# Patient Record
Sex: Female | Born: 1950 | Race: White | Hispanic: No | Smoking: Former smoker
Health system: Southern US, Community
[De-identification: ages and names within clinical notes are randomized; demographics above are authoritative.]

## PROBLEM LIST (undated history)

## (undated) DIAGNOSIS — M109 Gout, unspecified: Secondary | ICD-10-CM

## (undated) DIAGNOSIS — E049 Nontoxic goiter, unspecified: Secondary | ICD-10-CM

## (undated) DIAGNOSIS — T7840XA Allergy, unspecified, initial encounter: Secondary | ICD-10-CM

## (undated) DIAGNOSIS — G35 Multiple sclerosis: Secondary | ICD-10-CM

## (undated) DIAGNOSIS — I10 Essential (primary) hypertension: Secondary | ICD-10-CM

## (undated) DIAGNOSIS — F32A Depression, unspecified: Secondary | ICD-10-CM

## (undated) DIAGNOSIS — E785 Hyperlipidemia, unspecified: Secondary | ICD-10-CM

## (undated) DIAGNOSIS — K219 Gastro-esophageal reflux disease without esophagitis: Secondary | ICD-10-CM

## (undated) DIAGNOSIS — S7290XA Unspecified fracture of unspecified femur, initial encounter for closed fracture: Secondary | ICD-10-CM

## (undated) DIAGNOSIS — N390 Urinary tract infection, site not specified: Secondary | ICD-10-CM

## (undated) DIAGNOSIS — I82409 Acute embolism and thrombosis of unspecified deep veins of unspecified lower extremity: Secondary | ICD-10-CM

## (undated) DIAGNOSIS — J449 Chronic obstructive pulmonary disease, unspecified: Secondary | ICD-10-CM

## (undated) DIAGNOSIS — IMO0001 Reserved for inherently not codable concepts without codable children: Secondary | ICD-10-CM

## (undated) DIAGNOSIS — E039 Hypothyroidism, unspecified: Secondary | ICD-10-CM

## (undated) DIAGNOSIS — E1140 Type 2 diabetes mellitus with diabetic neuropathy, unspecified: Secondary | ICD-10-CM

## (undated) DIAGNOSIS — F329 Major depressive disorder, single episode, unspecified: Secondary | ICD-10-CM

## (undated) DIAGNOSIS — C50919 Malignant neoplasm of unspecified site of unspecified female breast: Secondary | ICD-10-CM

## (undated) DIAGNOSIS — G35D Multiple sclerosis, unspecified: Secondary | ICD-10-CM

## (undated) DIAGNOSIS — G629 Polyneuropathy, unspecified: Secondary | ICD-10-CM

## (undated) DIAGNOSIS — E1165 Type 2 diabetes mellitus with hyperglycemia: Secondary | ICD-10-CM

## (undated) HISTORY — DX: Major depressive disorder, single episode, unspecified: F32.9

## (undated) HISTORY — PX: TONSILLECTOMY: SHX5217

## (undated) HISTORY — DX: Hypothyroidism, unspecified: E03.9

## (undated) HISTORY — DX: Malignant neoplasm of unspecified site of unspecified female breast: C50.919

## (undated) HISTORY — DX: Hyperlipidemia, unspecified: E78.5

## (undated) HISTORY — DX: Gout, unspecified: M10.9

## (undated) HISTORY — DX: Gastro-esophageal reflux disease without esophagitis: K21.9

## (undated) HISTORY — DX: Type 2 diabetes mellitus with diabetic neuropathy, unspecified: E11.40

## (undated) HISTORY — DX: Reserved for inherently not codable concepts without codable children: IMO0001

## (undated) HISTORY — DX: Depression, unspecified: F32.A

## (undated) HISTORY — DX: Acute embolism and thrombosis of unspecified deep veins of unspecified lower extremity: I82.409

## (undated) HISTORY — DX: Nontoxic goiter, unspecified: E04.9

## (undated) HISTORY — DX: Multiple sclerosis, unspecified: G35.D

## (undated) HISTORY — DX: Polyneuropathy, unspecified: G62.9

## (undated) HISTORY — DX: Allergy, unspecified, initial encounter: T78.40XA

## (undated) HISTORY — DX: Type 2 diabetes mellitus with hyperglycemia: E11.65

## (undated) HISTORY — DX: Essential (primary) hypertension: I10

## (undated) HISTORY — PX: OTHER SURGICAL HISTORY: SHX169

## (undated) HISTORY — DX: Multiple sclerosis: G35

## (undated) HISTORY — DX: Chronic obstructive pulmonary disease, unspecified: J44.9

## (undated) HISTORY — PX: APPENDECTOMY: SHX54

---

## 1973-11-10 HISTORY — PX: PARTIAL HYSTERECTOMY: SHX80

## 1989-07-11 DIAGNOSIS — I82409 Acute embolism and thrombosis of unspecified deep veins of unspecified lower extremity: Secondary | ICD-10-CM

## 1989-07-11 HISTORY — DX: Acute embolism and thrombosis of unspecified deep veins of unspecified lower extremity: I82.409

## 1991-11-11 HISTORY — PX: AXILLARY HIDRADENITIS EXCISION: SUR522

## 1998-08-30 ENCOUNTER — Ambulatory Visit (HOSPITAL_COMMUNITY): Admission: RE | Admit: 1998-08-30 | Discharge: 1998-08-30 | Payer: Self-pay | Admitting: General Surgery

## 1998-08-30 ENCOUNTER — Encounter: Payer: Self-pay | Admitting: General Surgery

## 2002-04-13 ENCOUNTER — Encounter: Payer: Self-pay | Admitting: Internal Medicine

## 2004-05-14 ENCOUNTER — Inpatient Hospital Stay (HOSPITAL_COMMUNITY): Admission: EM | Admit: 2004-05-14 | Discharge: 2004-05-16 | Payer: Self-pay | Admitting: *Deleted

## 2004-05-16 HISTORY — PX: TRANSTHORACIC ECHOCARDIOGRAM: SHX275

## 2004-09-10 ENCOUNTER — Encounter (INDEPENDENT_AMBULATORY_CARE_PROVIDER_SITE_OTHER): Payer: Self-pay | Admitting: *Deleted

## 2004-09-10 ENCOUNTER — Observation Stay (HOSPITAL_COMMUNITY): Admission: RE | Admit: 2004-09-10 | Discharge: 2004-09-11 | Payer: Self-pay | Admitting: Surgery

## 2004-09-10 HISTORY — PX: THYROIDECTOMY: SHX17

## 2004-09-19 ENCOUNTER — Ambulatory Visit: Payer: Self-pay | Admitting: Internal Medicine

## 2004-10-08 ENCOUNTER — Ambulatory Visit: Payer: Self-pay | Admitting: Endocrinology

## 2004-10-29 ENCOUNTER — Ambulatory Visit: Payer: Self-pay | Admitting: Internal Medicine

## 2004-11-26 ENCOUNTER — Ambulatory Visit: Payer: Self-pay | Admitting: Endocrinology

## 2004-12-06 ENCOUNTER — Ambulatory Visit: Payer: Self-pay | Admitting: Endocrinology

## 2004-12-17 ENCOUNTER — Ambulatory Visit: Payer: Self-pay | Admitting: Endocrinology

## 2004-12-30 ENCOUNTER — Ambulatory Visit: Payer: Self-pay | Admitting: Internal Medicine

## 2005-01-14 ENCOUNTER — Ambulatory Visit: Payer: Self-pay | Admitting: Endocrinology

## 2005-01-21 ENCOUNTER — Encounter: Payer: Self-pay | Admitting: Internal Medicine

## 2005-01-28 ENCOUNTER — Ambulatory Visit: Payer: Self-pay | Admitting: Endocrinology

## 2005-02-11 ENCOUNTER — Ambulatory Visit: Payer: Self-pay | Admitting: Endocrinology

## 2005-03-11 ENCOUNTER — Ambulatory Visit: Payer: Self-pay | Admitting: Endocrinology

## 2005-03-17 ENCOUNTER — Ambulatory Visit: Payer: Self-pay | Admitting: Internal Medicine

## 2005-04-15 ENCOUNTER — Ambulatory Visit: Payer: Self-pay | Admitting: Endocrinology

## 2005-05-26 ENCOUNTER — Ambulatory Visit: Payer: Self-pay | Admitting: Internal Medicine

## 2005-05-30 ENCOUNTER — Ambulatory Visit: Payer: Self-pay | Admitting: Internal Medicine

## 2005-08-12 ENCOUNTER — Ambulatory Visit: Payer: Self-pay | Admitting: Endocrinology

## 2005-08-19 ENCOUNTER — Ambulatory Visit: Payer: Self-pay | Admitting: Endocrinology

## 2005-09-09 ENCOUNTER — Ambulatory Visit: Payer: Self-pay | Admitting: Internal Medicine

## 2005-10-06 ENCOUNTER — Ambulatory Visit: Payer: Self-pay | Admitting: Internal Medicine

## 2005-11-07 ENCOUNTER — Ambulatory Visit: Payer: Self-pay | Admitting: Family Medicine

## 2005-11-25 ENCOUNTER — Ambulatory Visit: Payer: Self-pay | Admitting: Internal Medicine

## 2006-02-17 ENCOUNTER — Ambulatory Visit: Payer: Self-pay | Admitting: Endocrinology

## 2006-03-06 ENCOUNTER — Ambulatory Visit: Payer: Self-pay | Admitting: Internal Medicine

## 2006-03-09 ENCOUNTER — Emergency Department: Payer: Self-pay | Admitting: Emergency Medicine

## 2006-03-09 ENCOUNTER — Other Ambulatory Visit: Payer: Self-pay

## 2006-03-24 ENCOUNTER — Ambulatory Visit: Payer: Self-pay | Admitting: Internal Medicine

## 2006-05-19 ENCOUNTER — Ambulatory Visit: Payer: Self-pay | Admitting: Internal Medicine

## 2006-05-28 ENCOUNTER — Ambulatory Visit: Payer: Self-pay | Admitting: Family Medicine

## 2006-06-23 ENCOUNTER — Ambulatory Visit: Payer: Self-pay | Admitting: Internal Medicine

## 2006-08-28 ENCOUNTER — Ambulatory Visit: Payer: Self-pay | Admitting: Internal Medicine

## 2006-12-15 ENCOUNTER — Ambulatory Visit: Payer: Self-pay | Admitting: Internal Medicine

## 2006-12-15 LAB — CONVERTED CEMR LAB
Chloride: 106 meq/L (ref 96–112)
Creatinine,U: 97.3 mg/dL
Hgb A1c MFr Bld: 8.4 % — ABNORMAL HIGH (ref 4.6–6.0)
Microalb, Ur: 0.8 mg/dL (ref 0.0–1.9)
Phosphorus: 3.1 mg/dL (ref 2.3–4.6)
TSH: 0.75 microintl units/mL (ref 0.35–5.50)

## 2007-01-20 ENCOUNTER — Ambulatory Visit: Payer: Self-pay | Admitting: Internal Medicine

## 2007-03-12 ENCOUNTER — Ambulatory Visit: Payer: Self-pay | Admitting: Internal Medicine

## 2007-04-07 ENCOUNTER — Telehealth (INDEPENDENT_AMBULATORY_CARE_PROVIDER_SITE_OTHER): Payer: Self-pay | Admitting: *Deleted

## 2007-05-25 ENCOUNTER — Telehealth (INDEPENDENT_AMBULATORY_CARE_PROVIDER_SITE_OTHER): Payer: Self-pay | Admitting: *Deleted

## 2007-06-05 ENCOUNTER — Encounter: Payer: Self-pay | Admitting: Internal Medicine

## 2007-06-05 DIAGNOSIS — I10 Essential (primary) hypertension: Secondary | ICD-10-CM | POA: Insufficient documentation

## 2007-06-05 DIAGNOSIS — F39 Unspecified mood [affective] disorder: Secondary | ICD-10-CM

## 2007-06-05 DIAGNOSIS — F17200 Nicotine dependence, unspecified, uncomplicated: Secondary | ICD-10-CM

## 2007-06-08 ENCOUNTER — Telehealth (INDEPENDENT_AMBULATORY_CARE_PROVIDER_SITE_OTHER): Payer: Self-pay | Admitting: *Deleted

## 2007-06-15 ENCOUNTER — Telehealth (INDEPENDENT_AMBULATORY_CARE_PROVIDER_SITE_OTHER): Payer: Self-pay | Admitting: *Deleted

## 2007-06-24 DIAGNOSIS — J309 Allergic rhinitis, unspecified: Secondary | ICD-10-CM | POA: Insufficient documentation

## 2007-06-24 DIAGNOSIS — R32 Unspecified urinary incontinence: Secondary | ICD-10-CM

## 2007-06-24 DIAGNOSIS — K219 Gastro-esophageal reflux disease without esophagitis: Secondary | ICD-10-CM

## 2007-07-01 DIAGNOSIS — E1149 Type 2 diabetes mellitus with other diabetic neurological complication: Secondary | ICD-10-CM

## 2007-07-20 ENCOUNTER — Ambulatory Visit: Payer: Self-pay | Admitting: Internal Medicine

## 2007-07-21 LAB — CONVERTED CEMR LAB
ALT: 37 units/L — ABNORMAL HIGH (ref 0–35)
AST: 41 units/L — ABNORMAL HIGH (ref 0–37)
BUN: 18 mg/dL (ref 6–23)
Basophils Relative: 0 % (ref 0.0–1.0)
Bilirubin, Direct: 0.1 mg/dL (ref 0.0–0.3)
CO2: 28 meq/L (ref 19–32)
Creatinine, Ser: 0.6 mg/dL (ref 0.4–1.2)
Eosinophils Absolute: 0.5 10*3/uL (ref 0.0–0.6)
Eosinophils Relative: 4.5 % (ref 0.0–5.0)
Free T4: 0.9 ng/dL (ref 0.6–1.6)
GFR calc Af Amer: 132 mL/min
GFR calc non Af Amer: 109 mL/min
Glucose, Bld: 191 mg/dL — ABNORMAL HIGH (ref 70–99)
Hemoglobin: 15.6 g/dL — ABNORMAL HIGH (ref 12.0–15.0)
Monocytes Relative: 12.4 % — ABNORMAL HIGH (ref 3.0–11.0)
Neutrophils Relative %: 57.3 % (ref 43.0–77.0)
Phosphorus: 3.6 mg/dL (ref 2.3–4.6)
Platelets: 210 10*3/uL (ref 150–400)
Potassium: 3.5 meq/L (ref 3.5–5.1)
Sodium: 140 meq/L (ref 135–145)
Total Bilirubin: 0.7 mg/dL (ref 0.3–1.2)
Total Protein: 7.3 g/dL (ref 6.0–8.3)

## 2007-08-27 ENCOUNTER — Ambulatory Visit: Payer: Self-pay | Admitting: Internal Medicine

## 2007-10-18 ENCOUNTER — Telehealth: Payer: Self-pay | Admitting: Internal Medicine

## 2007-10-20 ENCOUNTER — Encounter: Payer: Self-pay | Admitting: Internal Medicine

## 2007-11-29 ENCOUNTER — Ambulatory Visit: Payer: Self-pay | Admitting: Internal Medicine

## 2007-11-29 ENCOUNTER — Telehealth (INDEPENDENT_AMBULATORY_CARE_PROVIDER_SITE_OTHER): Payer: Self-pay | Admitting: *Deleted

## 2008-01-19 ENCOUNTER — Ambulatory Visit: Payer: Self-pay | Admitting: Internal Medicine

## 2008-01-20 LAB — CONVERTED CEMR LAB
BUN: 20 mg/dL (ref 6–23)
Creatinine, Ser: 0.8 mg/dL (ref 0.4–1.2)
Direct LDL: 133 mg/dL
GFR calc Af Amer: 94 mL/min
GFR calc non Af Amer: 78 mL/min
HDL: 41.1 mg/dL (ref >39.0)
Hemoglobin: 15.9 g/dL — ABNORMAL HIGH (ref 12.0–15.0)
Potassium: 3.4 meq/L — ABNORMAL LOW (ref 3.5–5.1)
TSH: 0.39 microintl units/mL (ref 0.35–5.50)

## 2008-02-10 ENCOUNTER — Encounter (INDEPENDENT_AMBULATORY_CARE_PROVIDER_SITE_OTHER): Payer: Self-pay | Admitting: Internal Medicine

## 2008-02-22 ENCOUNTER — Ambulatory Visit: Payer: Self-pay | Admitting: Family Medicine

## 2008-03-03 ENCOUNTER — Telehealth: Payer: Self-pay | Admitting: Internal Medicine

## 2008-03-08 ENCOUNTER — Telehealth (INDEPENDENT_AMBULATORY_CARE_PROVIDER_SITE_OTHER): Payer: Self-pay | Admitting: *Deleted

## 2008-03-10 HISTORY — PX: CARPAL TUNNEL RELEASE: SHX101

## 2008-03-14 ENCOUNTER — Encounter: Payer: Self-pay | Admitting: Internal Medicine

## 2008-03-15 ENCOUNTER — Encounter: Payer: Self-pay | Admitting: Internal Medicine

## 2008-04-06 ENCOUNTER — Telehealth (INDEPENDENT_AMBULATORY_CARE_PROVIDER_SITE_OTHER): Payer: Self-pay | Admitting: *Deleted

## 2008-04-07 ENCOUNTER — Encounter: Payer: Self-pay | Admitting: Internal Medicine

## 2008-04-11 ENCOUNTER — Ambulatory Visit (HOSPITAL_COMMUNITY): Admission: RE | Admit: 2008-04-11 | Discharge: 2008-04-11 | Payer: Self-pay | Admitting: Orthopedic Surgery

## 2008-04-13 ENCOUNTER — Encounter: Payer: Self-pay | Admitting: Internal Medicine

## 2008-04-26 ENCOUNTER — Ambulatory Visit: Payer: Self-pay | Admitting: Internal Medicine

## 2008-05-03 ENCOUNTER — Encounter: Payer: Self-pay | Admitting: Internal Medicine

## 2008-05-08 ENCOUNTER — Emergency Department (HOSPITAL_COMMUNITY): Admission: EM | Admit: 2008-05-08 | Discharge: 2008-05-08 | Payer: Self-pay | Admitting: Emergency Medicine

## 2008-05-18 ENCOUNTER — Telehealth: Payer: Self-pay | Admitting: Internal Medicine

## 2008-06-05 ENCOUNTER — Telehealth (INDEPENDENT_AMBULATORY_CARE_PROVIDER_SITE_OTHER): Payer: Self-pay | Admitting: *Deleted

## 2008-06-05 ENCOUNTER — Telehealth: Payer: Self-pay | Admitting: Internal Medicine

## 2008-06-15 ENCOUNTER — Telehealth (INDEPENDENT_AMBULATORY_CARE_PROVIDER_SITE_OTHER): Payer: Self-pay | Admitting: *Deleted

## 2008-06-27 ENCOUNTER — Ambulatory Visit: Payer: Self-pay | Admitting: Internal Medicine

## 2008-07-05 ENCOUNTER — Encounter: Payer: Self-pay | Admitting: Internal Medicine

## 2008-07-05 ENCOUNTER — Telehealth: Payer: Self-pay | Admitting: Internal Medicine

## 2008-07-06 ENCOUNTER — Telehealth: Payer: Self-pay | Admitting: Internal Medicine

## 2008-07-11 ENCOUNTER — Encounter: Payer: Self-pay | Admitting: Internal Medicine

## 2008-07-19 ENCOUNTER — Telehealth: Payer: Self-pay | Admitting: Internal Medicine

## 2008-07-21 ENCOUNTER — Telehealth: Payer: Self-pay | Admitting: Internal Medicine

## 2008-08-01 ENCOUNTER — Telehealth (INDEPENDENT_AMBULATORY_CARE_PROVIDER_SITE_OTHER): Payer: Self-pay | Admitting: *Deleted

## 2008-08-09 ENCOUNTER — Inpatient Hospital Stay (HOSPITAL_COMMUNITY): Admission: EM | Admit: 2008-08-09 | Discharge: 2008-08-13 | Payer: Self-pay | Admitting: Emergency Medicine

## 2008-08-09 ENCOUNTER — Ambulatory Visit: Payer: Self-pay | Admitting: Internal Medicine

## 2008-08-09 ENCOUNTER — Telehealth: Payer: Self-pay | Admitting: Internal Medicine

## 2008-08-10 ENCOUNTER — Ambulatory Visit: Payer: Self-pay | Admitting: Internal Medicine

## 2008-08-13 ENCOUNTER — Encounter: Payer: Self-pay | Admitting: Internal Medicine

## 2008-08-15 ENCOUNTER — Telehealth: Payer: Self-pay | Admitting: Family Medicine

## 2008-08-16 ENCOUNTER — Ambulatory Visit: Payer: Self-pay | Admitting: Internal Medicine

## 2008-08-16 DIAGNOSIS — G35 Multiple sclerosis: Secondary | ICD-10-CM

## 2008-09-07 ENCOUNTER — Telehealth: Payer: Self-pay | Admitting: Internal Medicine

## 2008-09-12 ENCOUNTER — Ambulatory Visit: Payer: Self-pay | Admitting: Family Medicine

## 2008-09-25 ENCOUNTER — Telehealth (INDEPENDENT_AMBULATORY_CARE_PROVIDER_SITE_OTHER): Payer: Self-pay | Admitting: *Deleted

## 2008-10-25 ENCOUNTER — Telehealth: Payer: Self-pay | Admitting: Internal Medicine

## 2008-10-30 ENCOUNTER — Ambulatory Visit: Payer: Self-pay | Admitting: Internal Medicine

## 2008-10-31 LAB — CONVERTED CEMR LAB
Albumin: 3.7 g/dL (ref 3.5–5.2)
Basophils Relative: 1 % (ref 0.0–3.0)
Chloride: 97 meq/L (ref 96–112)
Creatinine, Ser: 0.9 mg/dL (ref 0.4–1.2)
Eosinophils Relative: 8 % — ABNORMAL HIGH (ref 0.0–5.0)
GFR calc Af Amer: 82 mL/min
GFR calc non Af Amer: 68 mL/min
Glucose, Bld: 168 mg/dL — ABNORMAL HIGH (ref 70–99)
HCT: 44.7 % (ref 36.0–46.0)
HDL: 35 mg/dL — ABNORMAL LOW (ref >39.0)
Hemoglobin: 15.4 g/dL — ABNORMAL HIGH (ref 12.0–15.0)
Hgb A1c MFr Bld: 7 % — ABNORMAL HIGH (ref 4.6–6.0)
LDL Cholesterol: 116 mg/dL — ABNORMAL HIGH (ref 0–99)
Lymphocytes Relative: 23.8 % (ref 12.0–46.0)
MCHC: 34.4 g/dL (ref 30.0–36.0)
MCV: 94.2 fL (ref 78.0–100.0)
Neutrophils Relative %: 55.7 % (ref 43.0–77.0)
Platelets: 199 10*3/uL (ref 150–400)
RBC: 4.74 M/uL (ref 3.87–5.11)
Sodium: 141 meq/L (ref 135–145)
TSH: 3.78 microintl units/mL (ref 0.35–5.50)
VLDL: 37 mg/dL (ref 0–40)
WBC: 9.1 10*3/uL (ref 4.5–10.5)

## 2008-11-07 ENCOUNTER — Telehealth: Payer: Self-pay | Admitting: Internal Medicine

## 2008-11-13 ENCOUNTER — Telehealth: Payer: Self-pay | Admitting: Internal Medicine

## 2008-12-12 ENCOUNTER — Telehealth: Payer: Self-pay | Admitting: Internal Medicine

## 2008-12-14 ENCOUNTER — Telehealth: Payer: Self-pay | Admitting: Internal Medicine

## 2008-12-22 ENCOUNTER — Ambulatory Visit: Payer: Self-pay | Admitting: Internal Medicine

## 2008-12-29 ENCOUNTER — Telehealth: Payer: Self-pay | Admitting: Family Medicine

## 2009-01-01 ENCOUNTER — Telehealth: Payer: Self-pay | Admitting: Internal Medicine

## 2009-01-17 ENCOUNTER — Ambulatory Visit: Payer: Self-pay | Admitting: Internal Medicine

## 2009-01-17 ENCOUNTER — Inpatient Hospital Stay (HOSPITAL_COMMUNITY): Admission: EM | Admit: 2009-01-17 | Discharge: 2009-01-19 | Payer: Self-pay | Admitting: Emergency Medicine

## 2009-01-17 ENCOUNTER — Ambulatory Visit: Payer: Self-pay | Admitting: Cardiovascular Disease

## 2009-01-18 ENCOUNTER — Encounter: Payer: Self-pay | Admitting: Internal Medicine

## 2009-01-18 ENCOUNTER — Ambulatory Visit: Payer: Self-pay | Admitting: Surgery

## 2009-01-18 ENCOUNTER — Encounter (INDEPENDENT_AMBULATORY_CARE_PROVIDER_SITE_OTHER): Payer: Self-pay | Admitting: Internal Medicine

## 2009-01-20 ENCOUNTER — Telehealth: Payer: Self-pay | Admitting: Internal Medicine

## 2009-01-22 ENCOUNTER — Telehealth: Payer: Self-pay | Admitting: Family Medicine

## 2009-01-23 ENCOUNTER — Telehealth: Payer: Self-pay | Admitting: Internal Medicine

## 2009-01-31 ENCOUNTER — Encounter: Admission: RE | Admit: 2009-01-31 | Discharge: 2009-01-31 | Payer: Self-pay | Admitting: Internal Medicine

## 2009-01-31 LAB — HM MAMMOGRAPHY

## 2009-02-05 ENCOUNTER — Telehealth: Payer: Self-pay | Admitting: Internal Medicine

## 2009-02-08 ENCOUNTER — Encounter: Payer: Self-pay | Admitting: Internal Medicine

## 2009-02-08 DIAGNOSIS — C50919 Malignant neoplasm of unspecified site of unspecified female breast: Secondary | ICD-10-CM | POA: Insufficient documentation

## 2009-02-08 HISTORY — PX: MASTECTOMY, RADICAL: SHX710

## 2009-02-20 ENCOUNTER — Telehealth: Payer: Self-pay | Admitting: Internal Medicine

## 2009-02-26 ENCOUNTER — Telehealth: Payer: Self-pay | Admitting: Internal Medicine

## 2009-02-26 ENCOUNTER — Ambulatory Visit: Payer: Self-pay | Admitting: Surgery

## 2009-03-05 ENCOUNTER — Ambulatory Visit: Payer: Self-pay | Admitting: Surgery

## 2009-03-06 ENCOUNTER — Encounter: Payer: Self-pay | Admitting: Internal Medicine

## 2009-03-09 ENCOUNTER — Telehealth: Payer: Self-pay | Admitting: Internal Medicine

## 2009-03-10 ENCOUNTER — Ambulatory Visit: Payer: Self-pay | Admitting: Oncology

## 2009-03-13 ENCOUNTER — Encounter: Payer: Self-pay | Admitting: Internal Medicine

## 2009-03-16 ENCOUNTER — Telehealth: Payer: Self-pay | Admitting: Family Medicine

## 2009-03-20 ENCOUNTER — Telehealth: Payer: Self-pay | Admitting: Internal Medicine

## 2009-03-21 ENCOUNTER — Telehealth: Payer: Self-pay | Admitting: Internal Medicine

## 2009-03-22 ENCOUNTER — Ambulatory Visit: Payer: Self-pay | Admitting: Internal Medicine

## 2009-03-23 ENCOUNTER — Telehealth: Payer: Self-pay | Admitting: Internal Medicine

## 2009-03-27 ENCOUNTER — Telehealth (INDEPENDENT_AMBULATORY_CARE_PROVIDER_SITE_OTHER): Payer: Self-pay | Admitting: *Deleted

## 2009-03-28 ENCOUNTER — Telehealth: Payer: Self-pay | Admitting: Internal Medicine

## 2009-03-29 ENCOUNTER — Telehealth: Payer: Self-pay | Admitting: Internal Medicine

## 2009-04-03 ENCOUNTER — Ambulatory Visit: Payer: Self-pay | Admitting: Oncology

## 2009-04-10 ENCOUNTER — Ambulatory Visit: Payer: Self-pay | Admitting: Oncology

## 2009-04-11 ENCOUNTER — Ambulatory Visit: Payer: Self-pay | Admitting: Oncology

## 2009-04-17 ENCOUNTER — Ambulatory Visit: Payer: Self-pay | Admitting: Surgery

## 2009-04-18 ENCOUNTER — Encounter: Payer: Self-pay | Admitting: Internal Medicine

## 2009-04-18 ENCOUNTER — Ambulatory Visit: Payer: Self-pay | Admitting: Oncology

## 2009-04-23 ENCOUNTER — Encounter: Payer: Self-pay | Admitting: Internal Medicine

## 2009-04-27 ENCOUNTER — Inpatient Hospital Stay: Payer: Self-pay | Admitting: Internal Medicine

## 2009-05-01 ENCOUNTER — Telehealth: Payer: Self-pay | Admitting: Internal Medicine

## 2009-05-01 ENCOUNTER — Encounter: Payer: Self-pay | Admitting: Internal Medicine

## 2009-05-07 ENCOUNTER — Telehealth: Payer: Self-pay | Admitting: Family Medicine

## 2009-05-10 ENCOUNTER — Ambulatory Visit: Payer: Self-pay | Admitting: Oncology

## 2009-05-10 ENCOUNTER — Telehealth: Payer: Self-pay | Admitting: Internal Medicine

## 2009-05-10 ENCOUNTER — Encounter: Payer: Self-pay | Admitting: Internal Medicine

## 2009-05-15 ENCOUNTER — Encounter: Payer: Self-pay | Admitting: Internal Medicine

## 2009-06-01 ENCOUNTER — Ambulatory Visit: Payer: Self-pay | Admitting: Internal Medicine

## 2009-06-01 DIAGNOSIS — E039 Hypothyroidism, unspecified: Secondary | ICD-10-CM | POA: Insufficient documentation

## 2009-06-04 LAB — CONVERTED CEMR LAB
Albumin: 3.5 g/dL (ref 3.5–5.2)
Calcium: 8.9 mg/dL (ref 8.4–10.5)
Chloride: 98 meq/L (ref 96–112)
Free T4: 1.2 ng/dL (ref 0.6–1.6)
Glucose, Bld: 283 mg/dL — ABNORMAL HIGH (ref 70–99)
Hgb A1c MFr Bld: 9.9 % — ABNORMAL HIGH (ref 4.6–6.5)
Phosphorus: 3.8 mg/dL (ref 2.3–4.6)
Sodium: 138 meq/L (ref 135–145)

## 2009-06-05 ENCOUNTER — Encounter: Payer: Self-pay | Admitting: Internal Medicine

## 2009-06-10 ENCOUNTER — Ambulatory Visit: Payer: Self-pay | Admitting: Oncology

## 2009-06-15 ENCOUNTER — Telehealth: Payer: Self-pay | Admitting: Internal Medicine

## 2009-07-11 ENCOUNTER — Ambulatory Visit: Payer: Self-pay | Admitting: Oncology

## 2009-08-10 ENCOUNTER — Telehealth: Payer: Self-pay | Admitting: Internal Medicine

## 2009-08-10 ENCOUNTER — Ambulatory Visit: Payer: Self-pay | Admitting: Oncology

## 2009-08-13 ENCOUNTER — Telehealth: Payer: Self-pay | Admitting: Internal Medicine

## 2009-09-10 ENCOUNTER — Ambulatory Visit: Payer: Self-pay | Admitting: Oncology

## 2009-10-10 ENCOUNTER — Ambulatory Visit: Payer: Self-pay | Admitting: Oncology

## 2009-10-29 ENCOUNTER — Encounter: Payer: Self-pay | Admitting: Cardiovascular Disease

## 2009-11-10 ENCOUNTER — Ambulatory Visit: Payer: Self-pay | Admitting: Oncology

## 2009-11-20 ENCOUNTER — Encounter: Payer: Self-pay | Admitting: Internal Medicine

## 2009-12-11 ENCOUNTER — Ambulatory Visit: Payer: Self-pay | Admitting: Oncology

## 2009-12-31 ENCOUNTER — Encounter: Payer: Self-pay | Admitting: Internal Medicine

## 2009-12-31 ENCOUNTER — Encounter: Payer: Self-pay | Admitting: Cardiovascular Disease

## 2009-12-31 LAB — CONVERTED CEMR LAB
Alkaline Phosphatase: 77 units/L
Chloride: 95 meq/L
Potassium: 3.8 meq/L
Sodium: 133 meq/L

## 2010-01-02 ENCOUNTER — Ambulatory Visit: Payer: Self-pay | Admitting: Cardiovascular Disease

## 2010-01-03 ENCOUNTER — Telehealth (INDEPENDENT_AMBULATORY_CARE_PROVIDER_SITE_OTHER): Payer: Self-pay | Admitting: *Deleted

## 2010-01-08 ENCOUNTER — Ambulatory Visit: Payer: Self-pay | Admitting: Oncology

## 2010-01-31 ENCOUNTER — Ambulatory Visit: Payer: Self-pay | Admitting: Cardiovascular Disease

## 2010-01-31 DIAGNOSIS — E785 Hyperlipidemia, unspecified: Secondary | ICD-10-CM | POA: Insufficient documentation

## 2010-02-08 ENCOUNTER — Ambulatory Visit: Payer: Self-pay | Admitting: Oncology

## 2010-02-18 ENCOUNTER — Encounter: Payer: Self-pay | Admitting: Internal Medicine

## 2010-02-22 ENCOUNTER — Telehealth: Payer: Self-pay | Admitting: Internal Medicine

## 2010-03-04 ENCOUNTER — Telehealth: Payer: Self-pay | Admitting: Internal Medicine

## 2010-03-10 ENCOUNTER — Ambulatory Visit: Payer: Self-pay | Admitting: Oncology

## 2010-03-10 ENCOUNTER — Observation Stay: Payer: Self-pay | Admitting: Internal Medicine

## 2010-03-11 ENCOUNTER — Encounter: Payer: Self-pay | Admitting: Internal Medicine

## 2010-03-15 ENCOUNTER — Encounter: Payer: Self-pay | Admitting: Cardiovascular Disease

## 2010-03-18 ENCOUNTER — Inpatient Hospital Stay: Payer: Self-pay | Admitting: Internal Medicine

## 2010-03-25 ENCOUNTER — Telehealth: Payer: Self-pay | Admitting: Internal Medicine

## 2010-03-26 ENCOUNTER — Encounter: Payer: Self-pay | Admitting: Cardiovascular Disease

## 2010-03-28 ENCOUNTER — Telehealth: Payer: Self-pay | Admitting: Internal Medicine

## 2010-04-01 ENCOUNTER — Ambulatory Visit: Payer: Self-pay | Admitting: Internal Medicine

## 2010-04-01 DIAGNOSIS — J439 Emphysema, unspecified: Secondary | ICD-10-CM

## 2010-04-02 ENCOUNTER — Encounter: Payer: Self-pay | Admitting: Internal Medicine

## 2010-04-03 ENCOUNTER — Encounter: Payer: Self-pay | Admitting: Internal Medicine

## 2010-04-05 LAB — CONVERTED CEMR LAB
ALT: 54 units/L — ABNORMAL HIGH (ref 0–35)
AST: 50 units/L — ABNORMAL HIGH (ref 0–37)
Alkaline Phosphatase: 65 units/L (ref 39–117)
BUN: 17 mg/dL (ref 6–23)
Calcium: 8.7 mg/dL (ref 8.4–10.5)
Chloride: 102 meq/L (ref 96–112)
Creatinine, Ser: 0.6 mg/dL (ref 0.4–1.2)
Eosinophils Absolute: 0.4 10*3/uL (ref 0.0–0.7)
Eosinophils Relative: 4.6 % (ref 0.0–5.0)
GFR calc non Af Amer: 105.87 mL/min (ref >60)
Glucose, Bld: 205 mg/dL — ABNORMAL HIGH (ref 70–99)
Monocytes Relative: 14.4 % — ABNORMAL HIGH (ref 3.0–12.0)
Neutrophils Relative %: 65.3 % (ref 43.0–77.0)
Phosphorus: 3.3 mg/dL (ref 2.3–4.6)
Potassium: 3.9 meq/L (ref 3.5–5.1)
RBC: 4.37 M/uL (ref 3.87–5.11)
RDW: 14.2 % (ref 11.5–14.6)
Sodium: 144 meq/L (ref 135–145)
TSH: 3.5 microintl units/mL (ref 0.35–5.50)
Total Bilirubin: 0.6 mg/dL (ref 0.3–1.2)

## 2010-04-09 ENCOUNTER — Ambulatory Visit: Payer: Self-pay | Admitting: Internal Medicine

## 2010-04-09 ENCOUNTER — Telehealth: Payer: Self-pay | Admitting: Internal Medicine

## 2010-04-10 ENCOUNTER — Ambulatory Visit: Payer: Self-pay | Admitting: Oncology

## 2010-04-23 ENCOUNTER — Encounter: Payer: Self-pay | Admitting: Internal Medicine

## 2010-04-29 ENCOUNTER — Ambulatory Visit: Payer: Self-pay | Admitting: Internal Medicine

## 2010-05-09 ENCOUNTER — Telehealth (INDEPENDENT_AMBULATORY_CARE_PROVIDER_SITE_OTHER): Payer: Self-pay | Admitting: *Deleted

## 2010-05-10 ENCOUNTER — Ambulatory Visit: Payer: Self-pay | Admitting: Oncology

## 2010-05-17 ENCOUNTER — Encounter: Payer: Self-pay | Admitting: Internal Medicine

## 2010-05-20 ENCOUNTER — Encounter: Payer: Self-pay | Admitting: Internal Medicine

## 2010-05-29 ENCOUNTER — Ambulatory Visit: Payer: Self-pay | Admitting: Internal Medicine

## 2010-05-31 ENCOUNTER — Encounter: Payer: Self-pay | Admitting: Internal Medicine

## 2010-06-07 ENCOUNTER — Ambulatory Visit: Payer: Self-pay | Admitting: Internal Medicine

## 2010-06-10 ENCOUNTER — Ambulatory Visit: Payer: Self-pay | Admitting: Oncology

## 2010-06-24 ENCOUNTER — Ambulatory Visit: Payer: Self-pay | Admitting: Pain Medicine

## 2010-06-24 ENCOUNTER — Telehealth: Payer: Self-pay | Admitting: Internal Medicine

## 2010-07-03 ENCOUNTER — Encounter: Payer: Self-pay | Admitting: Internal Medicine

## 2010-07-03 ENCOUNTER — Telehealth: Payer: Self-pay | Admitting: Internal Medicine

## 2010-07-03 ENCOUNTER — Ambulatory Visit: Payer: Self-pay | Admitting: Pain Medicine

## 2010-07-09 ENCOUNTER — Telehealth: Payer: Self-pay | Admitting: Internal Medicine

## 2010-07-09 ENCOUNTER — Ambulatory Visit: Payer: Self-pay | Admitting: Internal Medicine

## 2010-07-11 ENCOUNTER — Ambulatory Visit: Payer: Self-pay | Admitting: Oncology

## 2010-07-18 ENCOUNTER — Telehealth: Payer: Self-pay | Admitting: Internal Medicine

## 2010-07-23 ENCOUNTER — Telehealth: Payer: Self-pay | Admitting: Internal Medicine

## 2010-07-24 ENCOUNTER — Telehealth: Payer: Self-pay | Admitting: Internal Medicine

## 2010-07-25 ENCOUNTER — Encounter: Payer: Self-pay | Admitting: Internal Medicine

## 2010-07-26 LAB — CANCER ANTIGEN 27.29: CA 27.29: 22.4 U/mL (ref 0.0–38.6)

## 2010-07-29 ENCOUNTER — Ambulatory Visit: Payer: Self-pay | Admitting: Internal Medicine

## 2010-07-29 ENCOUNTER — Telehealth: Payer: Self-pay | Admitting: Internal Medicine

## 2010-07-31 ENCOUNTER — Telehealth: Payer: Self-pay | Admitting: Internal Medicine

## 2010-08-06 ENCOUNTER — Ambulatory Visit: Payer: Self-pay | Admitting: Pain Medicine

## 2010-08-06 ENCOUNTER — Encounter: Payer: Self-pay | Admitting: Internal Medicine

## 2010-08-10 ENCOUNTER — Ambulatory Visit: Payer: Self-pay | Admitting: Oncology

## 2010-08-14 ENCOUNTER — Encounter: Payer: Self-pay | Admitting: Internal Medicine

## 2010-08-14 ENCOUNTER — Ambulatory Visit: Payer: Self-pay | Admitting: Pain Medicine

## 2010-08-16 ENCOUNTER — Telehealth (INDEPENDENT_AMBULATORY_CARE_PROVIDER_SITE_OTHER): Payer: Self-pay | Admitting: *Deleted

## 2010-08-28 ENCOUNTER — Ambulatory Visit: Payer: Self-pay | Admitting: Internal Medicine

## 2010-08-28 ENCOUNTER — Telehealth: Payer: Self-pay | Admitting: Internal Medicine

## 2010-08-28 LAB — CONVERTED CEMR LAB
ALT: 27 units/L (ref 0–35)
BUN: 22 mg/dL (ref 6–23)
Basophils Absolute: 0 10*3/uL (ref 0.0–0.1)
Basophils Relative: 0.4 % (ref 0.0–3.0)
CO2: 28 meq/L (ref 19–32)
Chloride: 101 meq/L (ref 96–112)
Cholesterol: 202 mg/dL — ABNORMAL HIGH (ref 0–200)
Creatinine, Ser: 0.9 mg/dL (ref 0.4–1.2)
Direct LDL: 122.1 mg/dL
Eosinophils Absolute: 0.3 10*3/uL (ref 0.0–0.7)
Free T4: 0.9 ng/dL (ref 0.60–1.60)
HCT: 41.3 % (ref 36.0–46.0)
Hgb A1c MFr Bld: 9.1 % — ABNORMAL HIGH (ref 4.6–6.5)
MCV: 90.3 fL (ref 78.0–100.0)
Monocytes Relative: 11.3 % (ref 3.0–12.0)
Neutrophils Relative %: 64.6 % (ref 43.0–77.0)
Platelets: 185 10*3/uL (ref 150.0–400.0)
Potassium: 4.4 meq/L (ref 3.5–5.1)
RBC: 4.57 M/uL (ref 3.87–5.11)
RDW: 15.3 % — ABNORMAL HIGH (ref 11.5–14.6)
TSH: 8.2 microintl units/mL — ABNORMAL HIGH (ref 0.35–5.50)
Total Bilirubin: 0.3 mg/dL (ref 0.3–1.2)
Total CHOL/HDL Ratio: 5
Triglycerides: 277 mg/dL — ABNORMAL HIGH (ref 0.0–149.0)

## 2010-09-10 ENCOUNTER — Telehealth: Payer: Self-pay | Admitting: Internal Medicine

## 2010-09-10 ENCOUNTER — Ambulatory Visit: Payer: Self-pay | Admitting: Oncology

## 2010-09-17 ENCOUNTER — Ambulatory Visit: Payer: Self-pay | Admitting: Pain Medicine

## 2010-09-17 ENCOUNTER — Encounter: Payer: Self-pay | Admitting: Internal Medicine

## 2010-09-18 ENCOUNTER — Telehealth: Payer: Self-pay | Admitting: Internal Medicine

## 2010-09-24 ENCOUNTER — Encounter: Payer: Self-pay | Admitting: Neurology

## 2010-09-24 ENCOUNTER — Ambulatory Visit: Payer: Self-pay | Admitting: Internal Medicine

## 2010-09-24 ENCOUNTER — Emergency Department: Payer: Self-pay | Admitting: Emergency Medicine

## 2010-09-25 ENCOUNTER — Encounter: Payer: Self-pay | Admitting: Internal Medicine

## 2010-09-25 ENCOUNTER — Ambulatory Visit: Payer: Self-pay | Admitting: Pain Medicine

## 2010-09-27 ENCOUNTER — Telehealth: Payer: Self-pay | Admitting: Internal Medicine

## 2010-09-30 ENCOUNTER — Ambulatory Visit: Payer: Self-pay | Admitting: Internal Medicine

## 2010-10-01 ENCOUNTER — Encounter: Payer: Self-pay | Admitting: Internal Medicine

## 2010-10-07 ENCOUNTER — Telehealth: Payer: Self-pay | Admitting: Internal Medicine

## 2010-10-09 ENCOUNTER — Emergency Department: Payer: Self-pay | Admitting: Emergency Medicine

## 2010-10-14 ENCOUNTER — Encounter: Payer: Self-pay | Admitting: Internal Medicine

## 2010-10-14 ENCOUNTER — Ambulatory Visit: Payer: Self-pay | Admitting: Pain Medicine

## 2010-10-16 ENCOUNTER — Encounter: Payer: Self-pay | Admitting: Internal Medicine

## 2010-10-17 ENCOUNTER — Ambulatory Visit: Payer: Self-pay | Admitting: Oncology

## 2010-10-17 ENCOUNTER — Telehealth: Payer: Self-pay | Admitting: Internal Medicine

## 2010-10-25 ENCOUNTER — Telehealth: Payer: Self-pay | Admitting: Internal Medicine

## 2010-10-29 ENCOUNTER — Telehealth: Payer: Self-pay | Admitting: Internal Medicine

## 2010-11-10 ENCOUNTER — Ambulatory Visit: Payer: Self-pay | Admitting: Oncology

## 2010-11-13 ENCOUNTER — Emergency Department: Payer: Self-pay | Admitting: Emergency Medicine

## 2010-11-14 ENCOUNTER — Telehealth: Payer: Self-pay | Admitting: Family Medicine

## 2010-11-14 ENCOUNTER — Encounter: Payer: Self-pay | Admitting: Internal Medicine

## 2010-11-14 ENCOUNTER — Encounter: Payer: Self-pay | Admitting: Family Medicine

## 2010-11-14 ENCOUNTER — Ambulatory Visit: Payer: Self-pay | Admitting: Pain Medicine

## 2010-11-15 ENCOUNTER — Other Ambulatory Visit: Payer: Self-pay | Admitting: Internal Medicine

## 2010-11-15 ENCOUNTER — Encounter: Payer: Self-pay | Admitting: Internal Medicine

## 2010-11-19 ENCOUNTER — Telehealth: Payer: Self-pay | Admitting: Internal Medicine

## 2010-11-19 ENCOUNTER — Encounter: Payer: Self-pay | Admitting: Internal Medicine

## 2010-11-21 ENCOUNTER — Encounter: Payer: Self-pay | Admitting: Internal Medicine

## 2010-11-25 ENCOUNTER — Ambulatory Visit: Admit: 2010-11-25 | Payer: Self-pay | Admitting: Internal Medicine

## 2010-11-25 ENCOUNTER — Telehealth: Payer: Self-pay | Admitting: Internal Medicine

## 2010-11-27 ENCOUNTER — Telehealth: Payer: Self-pay | Admitting: Internal Medicine

## 2010-11-27 ENCOUNTER — Encounter: Payer: Self-pay | Admitting: Internal Medicine

## 2010-12-02 ENCOUNTER — Ambulatory Visit: Payer: Self-pay | Admitting: Pain Medicine

## 2010-12-02 ENCOUNTER — Encounter: Payer: Self-pay | Admitting: Internal Medicine

## 2010-12-02 ENCOUNTER — Telehealth: Payer: Self-pay | Admitting: Internal Medicine

## 2010-12-08 LAB — CONVERTED CEMR LAB
BUN: 15 mg/dL (ref 6–23)
Basophils Absolute: 0.1 10*3/uL (ref 0.0–0.1)
Basophils Relative: 0.7 % (ref 0.0–3.0)
CO2: 30 meq/L (ref 19–32)
Creatinine, Ser: 0.9 mg/dL (ref 0.4–1.2)
Direct LDL: 143.8 mg/dL
Eosinophils Absolute: 0.6 10*3/uL (ref 0.0–0.7)
GFR calc Af Amer: 82 mL/min
Hemoglobin: 16.8 g/dL — ABNORMAL HIGH (ref 12.0–15.0)
Hgb A1c MFr Bld: 8.4 % — ABNORMAL HIGH (ref 4.6–6.0)
MCHC: 34.7 g/dL (ref 30.0–36.0)
MCV: 92.7 fL (ref 78.0–100.0)
Monocytes Absolute: 1.3 10*3/uL — ABNORMAL HIGH (ref 0.1–1.0)
Monocytes Relative: 11.6 % (ref 3.0–12.0)
Neutrophils Relative %: 55.1 % (ref 43.0–77.0)
Phosphorus: 4.5 mg/dL (ref 2.3–4.6)
Potassium: 3.1 meq/L — ABNORMAL LOW (ref 3.5–5.1)
RBC: 5.23 M/uL — ABNORMAL HIGH (ref 3.87–5.11)
TSH: 3.65 microintl units/mL (ref 0.35–5.50)

## 2010-12-10 NOTE — Progress Notes (Signed)
Summary: oxycodone  Phone Note Refill Request Call back at Home Phone [phone] Message from:  Fax from Pharmacy on August 16, 2010 2:03 PM  Refills Requested: Medication #1:  OXYCODONE-ACETAMINOPHEN 5-325 MG  TABS 1-2 tablests by mouth every 4 hours as needed for pain  Method Requested: Pick up at Office Initial call taken by: Melody Comas,  August 16, 2010 2:03 PM  Follow-up for Phone Call        Rx written Follow-up by: Cindee Salt MD,  August 16, 2010 2:10 PM  Additional Follow-up for Phone Call Additional follow up Details #1::        Patient advised via message left on voice mail at home.Consuello Masse CMA   Additional Follow-up by: Benny Lennert CMA Duncan Dull),  August 16, 2010 2:41 PM    Prescriptions: OXYCODONE-ACETAMINOPHEN 5-325 MG  TABS (OXYCODONE-ACETAMINOPHEN) 1-2 tablests by mouth every 4 hours as needed for pain  #90 x 0   Entered and Authorized by:   Cindee Salt MD   Signed by:   Cindee Salt MD on 08/16/2010   Method used:   Print then Give to Patient   RxID:   [id number]

## 2010-12-10 NOTE — Letter (Signed)
Summary: [facility]  [facility]   Imported By: Sherian Rein 08/02/2010 07:58:53  _____________________________________________________________________  External Attachment:    Type:   Image     Comment:   External Document  Appended Document: [facility]  stable on femara--will take till May 2015   Clinical Lists Changes  Observations: Added new observation of ZOSTAVAX: Zostavax (07/25/2010 13:38) Added new observation of FLU VAX: Historical (07/25/2010 13:38)       Immunization History:  Tetanus/Td Immunization History:    Tetanus/Td:  Td (06/27/2004)  Influenza Immunization History:    Influenza:  Historical (07/25/2010)  Pneumovax Immunization History:    Pneumovax:  Pneumovax (08/11/2003)  Zostavax History:    Zostavax # 1:  Zostavax (07/25/2010)

## 2010-12-10 NOTE — Consult Note (Signed)
Summary: [facility]  [facility]   Imported By: Lanelle Bal 05/03/2010 10:48:32  _____________________________________________________________________  External Attachment:    Type:   Image     Comment:   External Document  Appended Document: [facility] COPD checking overnight oximetry--may need formal sleep study

## 2010-12-10 NOTE — Letter (Signed)
Summary: CMN for Diabetes Supplies/[facility]  CMN for Diabetes Supplies/[facility]   Imported By: Lanelle Bal 05/23/2010 08:26:14  _____________________________________________________________________  External Attachment:    Type:   Image     Comment:   External Document

## 2010-12-10 NOTE — Letter (Signed)
Summary: [facility] [employer]-Attending Physician's Statement  [facility] [employer] [facility] [facility]Attending [facility] Statement   Imported By: Beau Fanny 04/02/2010 09:01:55  _____________________________________________________________________  External Attachment:    Type:   Image     Comment:   External Document

## 2010-12-10 NOTE — Progress Notes (Signed)
Summary: GABAPENTIN   Phone Note Refill Request Message from:  [facility] [phone] on February 22, 2010 4:51 PM  Refills Requested: Medication #1:  GABAPENTIN 300 MG CAPS 1 in the morning and 3 at bedtime   Last Refilled: 12/26/2009 Form on your desk    Method Requested: Fax to Local Pharmacy Initial call taken by: Mervin Hack CMA Duncan Dull),  February 22, 2010 4:51 PM  Follow-up for Phone Call        okay x 1 year Follow-up by: Cindee Salt MD,  February 25, 2010 10:51 AM  Additional Follow-up for Phone Call Additional follow up Details #1::        Rx faxed to pharmacy/ [facility] Additional Follow-up by: Mervin Hack CMA Duncan Dull),  February 25, 2010 11:57 AM    Prescriptions: ALLOPURINOL 100 MG  TABS (ALLOPURINOL) Take 1 tablet by mouth once a day  #90 x 3   Entered by:   Mervin Hack CMA (AAMA)   Authorized by:   Cindee Salt MD   Signed by:   Mervin Hack CMA (AAMA) on 02/25/2010   Method used:   Handwritten   RxID:   [id number]

## 2010-12-10 NOTE — Letter (Signed)
Summary: [facility]  [facility]   Imported By: Lanelle Bal 07/10/2010 09:54:26  _____________________________________________________________________  External Attachment:    Type:   Image     Comment:   External Document  Appended Document: [facility] several nerve blocks done

## 2010-12-10 NOTE — Progress Notes (Signed)
Summary: refill request for oxycodone  Phone Note Refill Request Call back at Home Phone [phone] Message from:  Patient  Refills Requested: Medication #1:  OXYCODONE-ACETAMINOPHEN 5-325 MG  TABS 1-2 tablests by mouth every 4 hours as needed for pain Please call when ready.  Initial call taken by: Lowella Petties CMA, AAMA,  September 10, 2010 10:29 AM  Follow-up for Phone Call        Rx written Follow-up by: Cindee Salt MD,  September 10, 2010 12:25 PM  Additional Follow-up for Phone Call Additional follow up Details #1::        Spoke with patient and advised rx ready for pick-up  Additional Follow-up by: Mervin Hack CMA Duncan Dull),  September 10, 2010 2:24 PM    Prescriptions: OXYCODONE-ACETAMINOPHEN 5-325 MG  TABS (OXYCODONE-ACETAMINOPHEN) 1-2 tablests by mouth every 4 hours as needed for pain  #90 x 0   Entered and Authorized by:   Cindee Salt MD   Signed by:   Cindee Salt MD on 09/10/2010   Method used:   Print then Give to Patient   RxID:   [id number]

## 2010-12-10 NOTE — Assessment & Plan Note (Signed)
Summary: 4 m f/u dlo   Vital Signs:  Patient profile:   60 year old female Weight:      288 pounds O2 Sat:      93 % on Room air Temp:     98.3 degrees F oral Pulse rate:   88 / minute Pulse rhythm:   regular BP sitting:   110 / 70  (left arm) Cuff size:   large  Vitals Entered By: Mervin Hack CMA Duncan Dull) (August 28, 2010 12:09 PM)  O2 Flow:  Room air CC: 4 month follow-up   History of Present Illness: Not doing that great did get some help from the "bottom" nerve blocks still having the left shoulder pain though fell taking trash out recently Hard to use cane and not happy with rollator  depressed about ongoing medical issues "like what's the use" No suicidal ideation Very restricted---can't go anywhere without help had to come here with paid bus  Diabetic control is variable tends to go up at night and be high in AM Better by lunch and afternoon  breast cancer quiet concerned about knot in upper right arm now  Allergies: 1)  ! * Penicillin 2)  * Trovan 3)  * Avandia 4)  * Percocet  Past History:  Past medical, surgical, family and social histories (including risk factors) reviewed for relevance to current acute and chronic problems.  Past Medical History: Reviewed history from 04/01/2010 and no changes required. Depression Multi-Nodular Goiter Dyslipidemia NIDDM with neuropathy Hypertension Allergic rhinitis GERD Urinary incontinence Peripheral neuropathy Multiple sclerosis Breast cancer Hypothyroidism Asthma Right leg Deep Vein Thrombosis in 1990s, COPD  Past Surgical History: Reviewed history from 03/15/2010 and no changes required. Thyroidectomy (09/2004) Transthoracic Echocardiogram (05/16/2004) Partial Hysterectomy 1975 Excision biopsy growth right axilla, benign 1993 Throat growth (benign) 1989 Thyroid biopsy 10/99 Vertigo- MRI/MRA/ECHO/Carotids 07/05 Carpal tunnel release bilat (5-6/09) Left modified radical mastectomy--4/10    Dr Renda Rolls Tonsillectomy Appendectomy  Family History: Reviewed history and no changes required.  Social History: Reviewed history from 12/22/2008 and no changes required. Widowed 1999 then 2nd Marriage--2000. Widowed again 2009 Children: 4 Occupation: works in Community education officer office---currently disabled Former Smoker--quit  ~2005 Alcohol use-no  Review of Systems       still having sleep problems appetite is fair weight is down 12# since last visit  Physical Exam  General:  alert.  NAD Neck:  supple, no masses, no thyromegaly, and no cervical lymphadenopathy.   Lungs:  normal respiratory effort, no intercostal retractions, no accessory muscle use, and normal breath sounds.   Heart:  normal rate, regular rhythm, no murmur, and no gallop.   Pulses:  faint in feet Extremities:  thick calves without pitting Skin:  apparent cyst in left upper extensor right arm  Psych:  normally interactive, depressed affect, and subdued.    Diabetes Management Exam:    Foot Exam (with socks and/or shoes not present):       Sensory-Pinprick/Light touch:          Left medial foot (L-4): diminished          Left dorsal foot (L-5): diminished          Left lateral foot (S-1): diminished          Right medial foot (L-4): diminished          Right dorsal foot (L-5): diminished          Right lateral foot (S-1): diminished       Inspection:  Left foot: normal          Right foot: normal       Nails:          Left foot: normal          Right foot: normal   Impression & Recommendations:  Problem # 1:  DIABETES MELLITUS, TYPE II, WITH NEUROLOGICAL COMPLICATIONS (ICD-250.60) Assessment Comment Only  If still high today, will need to increase lantus since AM's are the highest  Her updated medication list for this problem includes:    Metformin Hcl 1000 Mg Tabs (Metformin hcl) .Marland Kitchen... Take one by mouth two times a day    Lantus 100 Unit/ml Soln (Insulin glargine) ..... Inject  50 units   subcutaneously each evening as directed    Aspirin 325 Mg Tbec (Aspirin) .Marland Kitchen... Take one by mouth once a day    Novolog Flexpen 100 Unit/ml Soln (Insulin aspart) ..... Inject 30 units with each meal  Labs Reviewed: Creat: 0.6 (04/01/2010)    Reviewed HgBA1c results: 9.1 (04/01/2010)  9.9 (06/01/2009)  Orders: TLB-A1C / Hgb A1C (Glycohemoglobin) (83036-A1C)  Problem # 2:  DEPRESSION (ICD-311) Assessment: Comment Only doesn't think she is grieving clearly has symptoms of major depression has been on prozac in past will restart discussed potential side effects--including suicidal ideation  Her updated medication list for this problem includes:    Diazepam 5 Mg Tabs (Diazepam) .Marland Kitchen... 1/2 -1 two times a day as needed for nerves    Trazodone Hcl 100 Mg Tabs (Trazodone hcl) .Marland Kitchen... Take one half in the morning and two at bedtime    Fluoxetine Hcl 20 Mg Tabs (Fluoxetine hcl) .Marland Kitchen... 1 tab daily for depression  Problem # 3:  HYPERTENSION (ICD-401.9) Assessment: Unchanged  good control no changes  Her updated medication list for this problem includes:    Furosemide 40 Mg Tabs (Furosemide) .Marland Kitchen... Take one and one half daily    Carvedilol 25 Mg Tabs (Carvedilol) .Marland Kitchen... Take one tablet by mouth twice a day  BP today: 110/70 Prior BP: 128/80 (07/09/2010)  Labs Reviewed: K+: 3.9 (04/01/2010) Creat: : 0.6 (04/01/2010)   Chol: 188 (10/30/2008)   HDL: 35.0 (10/30/2008)   LDL: 116 (10/30/2008)   TG: 186 (10/30/2008)  Orders: TLB-Renal Function Panel (80069-RENAL) TLB-CBC Platelet - w/Differential (85025-CBCD)  Problem # 4:  MULTIPLE SCLEROSIS (ICD-340) Assessment: Comment Only very limited neurologist working on getting her a power wheelchair  Complete Medication List: 1)  Copaxone 20 Mg/ml Kit (Glatiramer acetate) .Marland Kitchen.. 1 injections once daily 2)  Metformin Hcl 1000 Mg Tabs (Metformin hcl) .... Take one by mouth two times a day 3)  Lantus 100 Unit/ml Soln (Insulin glargine) .... Inject   50 units  subcutaneously each evening as directed 4)  Furosemide 40 Mg Tabs (Furosemide) .... Take one and one half daily 5)  Albuterol 90 Mcg/act Aers (Albuterol) .... As needed 6)  Allopurinol 100 Mg Tabs (Allopurinol) .... Take 1 tablet by mouth once a day 7)  Oxycodone-acetaminophen 5-325 Mg Tabs (Oxycodone-acetaminophen) .Marland Kitchen.. 1-2 tablests by mouth every 4 hours as needed for pain 8)  Klor-con M10 10 Meq Tbcr (Potassium chloride crys cr) .... Take 1 tablet by mouth two times a day 9)  Diazepam 5 Mg Tabs (Diazepam) .... 1/2 -1 two times a day as needed for nerves 10)  Cyclobenzaprine Hcl 10 Mg Tabs (Cyclobenzaprine hcl) .Marland Kitchen.. 1 three times a day as needed for muscle spasm 11)  Meloxicam 15 Mg Tabs (Meloxicam) .Marland Kitchen.. 1 daily as needed for  pain 12)  Trazodone Hcl 100 Mg Tabs (Trazodone hcl) .... Take one half in the morning and two at bedtime 13)  Levothyroxine Sodium 175 Mcg Tabs (Levothyroxine sodium) .Marland Kitchen.. 1 tab daily 14)  Gabapentin 300 Mg Caps (Gabapentin) .... Take 1 tablet in am and 3 tablets at bedtime 15)  Carvedilol 25 Mg Tabs (Carvedilol) .... Take one tablet by mouth twice a day 16)  Femara 2.5 Mg Tabs (Letrozole) .... Take 1 tablet by mouth once daily 17)  Nicotine 21 Mg/24hr Pt24 (Nicotine) .... Use as directed, qs 1 month 18)  Symbicort 160-4.5 Mcg/act Aero (Budesonide-formoterol fumarate) .... Take one puff two times a day 19)  Omeprazole 20 Mg Cpdr (Omeprazole) .... Take 1 by mouth once daily 20)  Aspirin 325 Mg Tbec (Aspirin) .... Take one by mouth once a day 21)  Tylenol Pm Extra Strength 500-25 Mg Tabs (Diphenhydramine-apap (sleep)) .... As needed 22)  Fish Oil Oil (Fish oil) .... Take 1 tablet  by mouth once daily 23)  Calcium-vitamin D 600-200 Mg-unit Tabs (Calcium-vitamin d) .... Take one tablet by mouth daily 24)  Bd Pen Needle Ultrafine 29g X 12.74mm Misc (Insulin pen needle) .... Patient test 3-4 times daily 25)  Novolog Flexpen 100 Unit/ml Soln (Insulin aspart) ....  Inject 30 units with each meal 26)  Fluoxetine Hcl 20 Mg Tabs (Fluoxetine hcl) .Marland Kitchen.. 1 tab daily for depression  Other Orders: TLB-Lipid Panel (80061-LIPID) TLB-Hepatic/Liver Function Pnl (80076-HEPATIC) TLB-TSH (Thyroid Stimulating Hormone) (84443-TSH) Venipuncture (11914) TLB-T4 (Thyrox), Free [phone]) Zoster (Shingles) Vaccine Live [phone]) Admin 1st Vaccine (84696)  Patient Instructions: 1)  Please schedule a follow-up appointment in 1 month.  Prescriptions: FLUOXETINE HCL 20 MG TABS (FLUOXETINE HCL) 1 tab daily for depression  #30 x 11   Entered and Authorized by:   Cindee Salt MD   Signed by:   Cindee Salt MD on 08/28/2010   Method used:   [facility]        [facility]       [address]       [place], [place]  [address]       Ph: [phone]       Fax: [phone]   RxID:   [id number]    Orders Added: 1)  Est. Patient Level IV [29518] 2)  TLB-A1C / Hgb A1C (Glycohemoglobin) [83036-A1C] 3)  TLB-Lipid Panel [80061-LIPID] 4)  TLB-Hepatic/Liver Function Pnl [80076-HEPATIC] 5)  TLB-TSH (Thyroid Stimulating Hormone) [84443-TSH] 6)  Venipuncture [36415] 7)  TLB-T4 (Thyrox), Free [84166-AY3K] 8)  TLB-Renal Function Panel [80069-RENAL] 9)  TLB-CBC Platelet - w/Differential [85025-CBCD] 10)  Zoster (Shingles) Vaccine Live [90736] 11)  Admin 1st Vaccine [16010]   Immunizations Administered:  Zostavax # 1:    Vaccine Type: Zostavax    Site: right deltoid    Mfr: Merck    Dose: 0.53ml    Route: [place]    Given by: Mervin Hack CMA (AAMA)    Exp. Date: 06/28/2011    Lot #: [id number]    VIS given: 08/22/05 given August 28, 2010.   Immunizations Administered:  Zostavax # 1:    Vaccine Type: Zostavax    Site: right deltoid    Mfr: Merck    Dose: 0.27ml    Route: [place]    Given by: Mervin Hack CMA (AAMA)    Exp. Date: 06/28/2011    Lot #: [id number]    VIS given: 08/22/05 given  August 28, 2010.  Current Allergies (reviewed today): ! * PENICILLIN * TROVAN * AVANDIA * PERCOCET

## 2010-12-10 NOTE — Progress Notes (Signed)
Summary:  verbal ok given for pill box fills  Phone Note Call from Patient   Caller: Velna Hatchet- [facility] heath nurse with [facility]  [phone] Call For: Cindee Salt MD Summary of Call: Verbal ok given for home health nurse to recertify pt for medicaid pill box fills. Initial call taken by: Lowella Petties CMA, AAMA,  September 18, 2010 8:16 AM  Follow-up for Phone Call        okay Follow-up by: Cindee Salt MD,  September 18, 2010 9:11 AM

## 2010-12-10 NOTE — Progress Notes (Signed)
Summary: oxycodone  Phone Note Refill Request Call back at [phone] Message from:  Patient on October 07, 2010 8:53 AM  Refills Requested: Medication #1:  OXYCODONE-ACETAMINOPHEN 5-325 MG  TABS 1-2 tablests by mouth every 4 hours as needed for pain Patient is also asking if she could get a written copy of her sliding scale fro her insulin.    Method Requested: Pick up at Office Initial call taken by: Melody Comas,  October 07, 2010 8:56 AM  Follow-up for Phone Call        Rx written   she should take an extra 5 units of novolog/humalog if her sugar is over 250 before a meal and an extra 10 units if it is over 350 (on top of the regular 30 units that she takes) I am not sure that we discussed this in the past, but this would be my recommendation Follow-up by: Cindee Salt MD,  October 07, 2010 1:43 PM  Additional Follow-up for Phone Call Additional follow up Details #1::        Spoke with patient and advised rx ready for pick-up  Additional Follow-up by: Mervin Hack CMA Duncan Dull),  October 07, 2010 2:41 PM    Prescriptions: OXYCODONE-ACETAMINOPHEN 5-325 MG  TABS (OXYCODONE-ACETAMINOPHEN) 1-2 tablests by mouth every 4 hours as needed for pain  #90 x 0   Entered and Authorized by:   Cindee Salt MD   Signed by:   Cindee Salt MD on 10/07/2010   Method used:   Print then Give to Patient   RxID:   [id number]

## 2010-12-10 NOTE — Progress Notes (Signed)
Summary: asking for skilled nursing eval  Phone Note From Other Clinic   Caller: Creta Levin- PT with [facility]  [phone] Summary of Call: Physical therapist states pt was discharged from [facility] yesterday and she is asking if pt can have a skilled nursing evaluation. Verbal order can be called to [phone] or faxed to [phone]. Initial call taken by: Lowella Petties CMA,  Mar 28, 2010 2:05 PM  Follow-up for Phone Call        I cannot do this order since I have not seen her in nearly a year and can only do it if she reestablishes under my care Follow-up by: Cindee Salt MD,  Mar 28, 2010 2:40 PM  Additional Follow-up for Phone Call Additional follow up Details #1::        Advised physical therapist, left message on voice mail. Additional Follow-up by: Lowella Petties CMA,  Mar 28, 2010 2:53 PM

## 2010-12-10 NOTE — Assessment & Plan Note (Signed)
Summary: 4WK FOLLOW UP / LFW   Vital Signs:  Patient profile:   60 year old female Weight:      295 pounds Temp:     98.7 degrees F oral Pulse rate:   88 / minute Pulse rhythm:   regular BP sitting:   120 / 70  (left arm) Cuff size:   large  Vitals Entered By: Mervin Hack CMA Duncan Dull) (April 29, 2010 10:48 AM) CC: 4 week follow-up   History of Present Illness: Doing okay  Took her last chemo last week has follow up scheduled and mammo  Had been seeing Dr Thomasena Edis at [facility] neurolog[facility] but he is leaving Dr Kemper Durie is not taking new patients Back on the list at [facility] neuro MS fairly stable Provigil helps her stay alert in afternoon  diabetes okay No hypoglycemic spells random sugars in 230 range  "moody person" the last few weeks Still likes her apt in town prefers to stay to herself gets out shopping with daughter   breathing has been stable no sig cough or SOB  Allergies: 1)  ! * Penicillin 2)  * Trovan 3)  * Avandia 4)  * Percocet  Past History:  Past medical, surgical, family and social histories (including risk factors) reviewed for relevance to current acute and chronic problems.  Past Medical History: Reviewed history from 04/01/2010 and no changes required. Depression Multi-Nodular Goiter Dyslipidemia NIDDM with neuropathy Hypertension Allergic rhinitis GERD Urinary incontinence Peripheral neuropathy Multiple sclerosis Breast cancer Hypothyroidism Asthma Right leg Deep Vein Thrombosis in 1990s, COPD  Past Surgical History: Reviewed history from 03/15/2010 and no changes required. Thyroidectomy (09/2004) Transthoracic Echocardiogram (05/16/2004) Partial Hysterectomy 1975 Excision biopsy growth right axilla, benign 1993 Throat growth (benign) 1989 Thyroid biopsy 10/99 Vertigo- MRI/MRA/ECHO/Carotids 07/05 Carpal tunnel release bilat (5-6/09) Left modified radical mastectomy--4/10   Dr Renda Rolls Tonsillectomy Appendectomy  Family  History: Reviewed history and no changes required.  Social History: Reviewed history from 12/22/2008 and no changes required. Widowed 1999 then 2nd Marriage--2000. Widowed again 2009 Children: 4 Occupation: works in Community education officer office---currently disabled Former Smoker--quit  ~2005 Alcohol use-no  Review of Systems       sleeping okay  appetite fairly good weight is stable  Physical Exam  General:  alert and normal appearance.   Neck:  supple, no masses, and no thyromegaly.   Lungs:  normal respiratory effort and normal breath sounds.   Heart:  normal rate, regular rhythm, no murmur, and no gallop.   Extremities:  2+ edema in calves --hose on Psych:  normally interactive, good eye contact, not anxious appearing, and not depressed appearing.     Impression & Recommendations:  Problem # 1:  DIABETES MELLITUS, TYPE II, WITH NEUROLOGICAL COMPLICATIONS (ICD-250.60) Assessment Unchanged discussed improving control by better diet  Her updated medication list for this problem includes:    Metformin Hcl 1000 Mg Tabs (Metformin hcl) .Marland Kitchen... Take one by mouth two times a day    Humalog Pen 100 Unit/ml Soln (Insulin lispro (human)) ..... Inject 30 units with each meal    Lantus 100 Unit/ml Soln (Insulin glargine) ..... Inject  50 units  subcutaneously each evening as directed    Aspirin 325 Mg Tbec (Aspirin) .Marland Kitchen... Take one by mouth once a day  Labs Reviewed: Creat: 0.6 (04/01/2010)    Reviewed HgBA1c results: 9.1 (04/01/2010)  9.9 (06/01/2009)  Problem # 2:  COPD (ICD-496) Assessment: Improved breathing better back to her usual baseline  Her updated medication list for this problem  includes:    Albuterol 90 Mcg/act Aers (Albuterol) .Marland Kitchen... As needed    Symbicort 160-4.5 Mcg/act Aero (Budesonide-formoterol fumarate) .Marland Kitchen... Take one puff two times a day  Problem # 3:  MULTIPLE SCLEROSIS (ICD-340) Assessment: Unchanged stable trying to get back with [facility] now I will refill  provigil till that time  Problem # 4:  ADENOCARCINOMA, BREAST, LEFT (ICD-174.9) Assessment: Comment Only finished with chemo waiting for word about where she is  Complete Medication List: 1)  Copaxone 20 Mg/ml Kit (Glatiramer acetate) .Marland Kitchen.. 1 injections once daily 2)  Metformin Hcl 1000 Mg Tabs (Metformin hcl) .... Take one by mouth two times a day 3)  Humalog Pen 100 Unit/ml Soln (Insulin lispro (human)) .... Inject 30 units with each meal 4)  Lantus 100 Unit/ml Soln (Insulin glargine) .... Inject  50 units  subcutaneously each evening as directed 5)  Furosemide 40 Mg Tabs (Furosemide) .... Take one and one half daily 6)  Albuterol 90 Mcg/act Aers (Albuterol) .... As needed 7)  Allopurinol 100 Mg Tabs (Allopurinol) .... Take 1 tablet by mouth once a day 8)  Oxycodone-acetaminophen 5-325 Mg Tabs (Oxycodone-acetaminophen) .Marland Kitchen.. 1-2 tablests by mouth every 4 hours as needed for pain 9)  Klor-con M10 10 Meq Tbcr (Potassium chloride crys cr) .... Take 1 tablet by mouth two times a day 10)  Diazepam 5 Mg Tabs (Diazepam) .... 1/2 -1 two times a day as needed for nerves 11)  Cyclobenzaprine Hcl 10 Mg Tabs (Cyclobenzaprine hcl) .Marland Kitchen.. 1 three times a day as needed for muscle spasm 12)  Meloxicam 15 Mg Tabs (Meloxicam) .Marland Kitchen.. 1 daily as needed for pain 13)  Trazodone Hcl 100 Mg Tabs (Trazodone hcl) .... Take one half in the morning and two at bedtime 14)  Levothyroxine Sodium 175 Mcg Tabs (Levothyroxine sodium) .Marland Kitchen.. 1 tab daily 15)  Gabapentin 300 Mg Caps (Gabapentin) .... Take 1 tablet in am and 3 tablets at bedtime 16)  Carvedilol 25 Mg Tabs (Carvedilol) .... Take one tablet by mouth twice a day 17)  Femara 2.5 Mg Tabs (Letrozole) .... Take 1 tablet by mouth once daily 18)  Flexeril 10 Mg Tabs (Cyclobenzaprine hcl) .... Take 1 tablet by mouth every eight hours as needed 19)  Nicotine 14 Mg/24hr Pt24 (Nicotine) .... Use as directed 20)  Provigil 100 Mg Tabs (Modafinil) .... Take one tablet by mouth  daily 21)  Symbicort 160-4.5 Mcg/act Aero (Budesonide-formoterol fumarate) .... Take one puff two times a day 22)  Omeprazole 20 Mg Cpdr (Omeprazole) .... Take 1 by mouth once daily 23)  Aspirin 325 Mg Tbec (Aspirin) .... Take one by mouth once a day 24)  Tylenol Pm Extra Strength 500-25 Mg Tabs (Diphenhydramine-apap (sleep)) .... As needed 25)  Fish Oil Oil (Fish oil) .... Take 1 tablet  by mouth once daily 26)  Calcium-vitamin D 600-200 Mg-unit Tabs (Calcium-vitamin d) .... Take one tablet by mouth daily 27)  Bd Pen Needle Ultrafine 29g X 12.74mm Misc (Insulin pen needle) .... Patient test 3-4 times daily  Patient Instructions: 1)  Please schedule a follow-up appointment in 4 months .  Prescriptions: PROVIGIL 100 MG TABS (MODAFINIL) take one tablet by mouth daily  #30 x 0   Entered and Authorized by:   Cindee Salt MD   Signed by:   Cindee Salt MD on 04/29/2010   Method used:   Print then Give to Patient   RxID:   [id number]   Current Allergies (reviewed today): ! * PENICILLIN *  TROVAN * AVANDIA * PERCOCET

## 2010-12-10 NOTE — Progress Notes (Signed)
Summary: UTI symptoms  Phone Note Call from Patient Call back at Home Phone [phone]   Caller: Patient Call For: Cindee Salt MD Summary of Call: Patient says she has a UTI and needs ABX.  I told her that she would likely need an appointment.  She stated that the [facility] usually just gave her ABX.  Please advise.  [facility] Initial call taken by: Delilah Shan CMA Duncan Dull),  May 09, 2010 9:30 AM  Follow-up for Phone Call        okay to send Rs for cefuroxime 250mg  two times a day  #14 x 0 If continued symptoms, will need eval here at at cancer center next week Follow-up by: Cindee Salt MD,  May 09, 2010 10:17 AM    New/Updated Medications: CEFUROXIME AXETIL 250 MG TABS (CEFUROXIME AXETIL) Take 1 tablet by mouth two times a day Prescriptions: CEFUROXIME AXETIL 250 MG TABS (CEFUROXIME AXETIL) Take 1 tablet by mouth two times a day  #14 x 0   Entered by:   Delilah Shan CMA (AAMA)   Authorized by:   Cindee Salt MD   Signed by:   Delilah Shan CMA (AAMA) on 05/09/2010   Method used:   Electronically to        [facility]       [address]       [place], [place]  [address]       Ph: [phone]       Fax: [phone]   RxID:   [id number]

## 2010-12-10 NOTE — Letter (Signed)
Summary: Discharge Summary  Discharge Summary   Imported By: [place] Carbo 03/26/2010 10:22:47  _____________________________________________________________________  External Attachment:    Type:   Image     Comment:   External Document

## 2010-12-10 NOTE — Miscellaneous (Signed)
Summary: lab update  Clinical Lists Changes  Observations: Added new observation of GFRAA: 55 mL/min (12/31/2009 13:47) Added new observation of GFR: >66 mL/min (12/31/2009 13:47) Added new observation of CALCIUM: 9.2 mg/dL (16/08/9603 54:09) Added new observation of ALK PHOS: 77 units/L (12/31/2009 13:47) Added new observation of BILI TOTAL: 0.3 mg/dL (81/19/1478 29:56) Added new observation of SGPT (ALT): 88 units/L (12/31/2009 13:47) Added new observation of SGOT (AST): 75 units/L (12/31/2009 13:47) Added new observation of CO2 PLSM/SER: 22 meq/L (12/31/2009 13:47) Added new observation of CL SERUM: 95 meq/L (12/31/2009 13:47) Added new observation of K SERUM: 3.8 meq/L (12/31/2009 13:47) Added new observation of NA: 133 meq/L (12/31/2009 13:47) Added new observation of CREATININE: 1.07 mg/dL (21/30/8657 84:69) Added new observation of BUN: 29 mg/dL (62/95/2841 32:44) Added new observation of BG RANDOM: 311 mg/dL (11/12/7251 66:44)      -  Date:  12/31/2009    BG Random: 311    BUN: 29    Creatinine: 1.07    Sodium: 133    Potassium: 3.8    Chloride: 95    CO2 Total: 22    SGOT (AST): 75    SGPT (ALT): 88    T. Bilirubin: 0.3    Alk Phos: 77    Calcium: 9.2    GFR(Non African American): >66    GFR(African American) 55

## 2010-12-10 NOTE — Letter (Signed)
Summary: CMN for Diabetes Supplies/[facility]  CMN for Diabetes Supplies/[facility]   Imported By: Lanelle Bal 05/23/2010 11:10:11  _____________________________________________________________________  External Attachment:    Type:   Image     Comment:   External Document

## 2010-12-10 NOTE — Letter (Signed)
Summary: [facility]  [facility]   Imported By: Maryln Gottron 06/07/2010 10:52:29  _____________________________________________________________________  External Attachment:    Type:   Image     Comment:   External Document  Appended Document: [facility] continuing copaxone for MS referred to pain center for pain management

## 2010-12-10 NOTE — Miscellaneous (Signed)
Summary: Care Plan/[facility]  Care Plan/[facility]   Imported By: Lanelle Bal 04/16/2010 08:48:03  _____________________________________________________________________  External Attachment:    Type:   Image     Comment:   External Document

## 2010-12-10 NOTE — Medication Information (Signed)
Summary: Patient Assistance Form for [employer]  Patient Assistance Form for [employer]   Imported By: Lanelle Bal 02/21/2010 11:26:43  _____________________________________________________________________  External Attachment:    Type:   Image     Comment:   External Document

## 2010-12-10 NOTE — Assessment & Plan Note (Signed)
Summary: F1M/AMD  Medications Added CARVEDILOL 25 MG TABS (CARVEDILOL) Take one tablet by mouth twice a day      Allergies Added:   Visit Type:  Follow-up Primary Provider:  Cindee Salt MD  CC:  chest pain on left side, nothing that bad! pt very upset due to the money issue. some short of breath, and but she tries to get up and walk. edema in ankles and feet...  History of Present Illness: Madeline [name] is a 60 year old woman, patient of Dr. Alphonsus Sias, with a history of diabetes, long smoking history with suspected COPD, breast cancer with XRT on the left him a status post mastectomy, currently undergoing chemotherapy every 3 weeks until June 2011, history of DVT on the right, chronic shortness of breath and multiple sclerosis who presents last visit for  worsening shortness of breath.  she is currently on Coreg 12.5 mg b.i.d. for heart rate control. Her heart rate was elevated and felt that with exertion this could be contributing to some of her shortness of breath. She appears to be tolerating the medication well and has not had any side effects. She states that overall, she is about the same with no worsening chest pain, just very mild episodes on the left side. "Nothing that bad ".  Current Problems (verified): 1)  Diab w/o Comp Type Ii/uns Not Stated Uncntrl  (ICD-250.00) 2)  Dyspnea  (ICD-786.05) 3)  Chest Pain-unspecified  (ICD-786.50) 4)  Hypothyroidism  (ICD-244.9) 5)  Back Pain  (ICD-724.5) 6)  Adenocarcinoma, Breast, Left  (ICD-174.9) 7)  Multiple Sclerosis  (ICD-340) 8)  Sleep Disorder  (ICD-780.50) 9)  Peripheral Neuropathy  (ICD-356.9) 10)  Carpal Tunnel Syndrome, Bilateral  (ICD-354.0) 11)  Palpitations  (ICD-785.1) 12)  Diabetes Mellitus, Type II, With Neurological Complications  (ICD-250.60) 13)  Urinary Incontinence  (ICD-788.30) 14)  Gerd  (ICD-530.81) 15)  Goiter  (ICD-240.9) 16)  Hx, Pneumonia  (ICD-V12.61) 17)  Allergic Rhinitis  (ICD-477.9) 18)  Tobacco  Abuse  (ICD-305.1) 19)  Dvt, Hx Of, Left With Chronic Anticoag.  (ICD-V12.51) 20)  Hypertension  (ICD-401.9) 21)  Depression  (ICD-311)  Current Medications (verified): 1)  Metformin Hcl 1000 Mg Tabs (Metformin Hcl) .... Take One By Mouth Two Times A Day 2)  Humalog Pen 100 Unit/ml  Soln (Insulin Lispro (Human)) .... Inject 30 Units With Each Meal 3)  Lantus 100 Unit/ml  Soln (Insulin Glargine) .... Inject  50 Units  Subcutaneously Each Evening As Directed 4)  Bd Ultra-Fine Iii Mini Pen Needles 3/16 Inch 31 Guage .... Use Three- Four  Times Daily As Directed 5)  Aspirin 325 Mg  Tbec (Aspirin) .... Take One By Mouth Once A Day 6)  Triamterene-Hctz 37.5-25 Mg  Caps (Triamterene-Hctz) .... Take One By Mouth Once A Day 7)  Furosemide 40 Mg  Tabs (Furosemide) .... Take One and One Half Daily 8)  Tylenol Pm Extra Strength 500-25 Mg  Tabs (Diphenhydramine-Apap (Sleep)) .... As Needed 9)  Albuterol 90 Mcg/act  Aers (Albuterol) .... As Needed 10)  Allopurinol 100 Mg  Tabs (Allopurinol) .... Take 1 Tablet By Mouth Once A Day 11)  Oxycodone-Acetaminophen 5-325 Mg  Tabs (Oxycodone-Acetaminophen) .Marland Kitchen.. 1-2 Tablests By Mouth Every 4 Hours As Needed For Pain 12)  Klor-Con M10 10 Meq  Tbcr (Potassium Chloride Crys Cr) .... 2 Tablets By Mouth Once Daily 13)  Diazepam 5 Mg  Tabs (Diazepam) .... 1/2 -1 Two Times A Day As Needed For Nerves 14)  Cyclobenzaprine Hcl 10 Mg  Tabs (  Cyclobenzaprine Hcl) .Marland Kitchen.. 1 Three Times A Day As Needed For Muscle Spasm 15)  Meloxicam 15 Mg  Tabs (Meloxicam) .Marland Kitchen.. 1 Daily As Needed For Pain 16)  Copaxone 20 Mg/ml Kit (Glatiramer Acetate) .Marland Kitchen.. 1 Injection [place] Daily, Disp Qs 17)  Trazodone Hcl 100 Mg Tabs (Trazodone Hcl) .... Take One Half in The Morning and Two At Bedtime 18)  Fish Oil  Oil (Fish Oil) .... Take 2 Tablets By Mouth Once Daily 19)  Protonix 40 Mg Tbec (Pantoprazole Sodium) .Marland Kitchen.. 1 By Mouth Once Daily 20)  Levothyroxine Sodium 175 Mcg Tabs (Levothyroxine Sodium) .Marland Kitchen.. 1 Tab  Daily 21)  Gabapentin 300 Mg Caps (Gabapentin) .Marland Kitchen.. 1 in The Morning and 3 At Bedtime 22)  Carvedilol 12.5 Mg Tabs (Carvedilol) .... Take One Tablet By Mouth Twice A Day  Allergies (verified): 1)  ! * Penicillin 2)  * Trovan 3)  * Avandia 4)  * Percocet  Past History:  Past Medical History: Last updated: 06/01/2009 Depression Multi-Nodular Goiter Dyslipidemia NIDDM with neuropathy Hypertension DVT, hx of Allergic rhinitis GERD Urinary incontinence Peripheral neuropathy Multiple sclerosis Breast cancer Hypothyroidism  Past Surgical History: Last updated: 03/22/2009 Thyroidectomy (09/2004) Transthoracic Echocardiogram (05/16/2004) Hysterectomy 1975 Excision biopsy growth right axilla, benign 1993 Throat growth (benign) 1989 Thyroid biopsy 10/99 Vertigo- MRI/MRA/ECHO/Carotids 07/05 Carpal tunnel release bilat (5-6/09) Left modified radical mastectomy--4/10   Dr Renda Rolls  Social History: Last updated: 12/22/2008 Widowed 1999 then 2nd Marriage--2000. 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  The patient denies fever, weight loss, weight gain, vision loss, decreased hearing, hoarseness, syncope, prolonged cough, abdominal pain, incontinence, muscle weakness, depression, and enlarged lymph nodes.    Vital Signs:  Patient profile:   60 year old female Height:      67 inches Weight:      295.50 pounds BMI:     46.45 Pulse rate:   89 / minute Pulse rhythm:   regular BP sitting:   156 / 97  (left arm) Cuff size:   large  Vitals Entered By: Madeline [name] (January 31, 2010 10:18 AM)  Physical Exam  General:  Obese woman in  no apparent distress, HEENT exam is benign, alert and oriented x3, neck is supple with no JVP or carotid bruits, heart sounds are regular with normal S1-S2 and no murmurs appreciated, lungs are clear to auscultation with no wheezes or rales, abdominal exam is benign, no significant lower extremity edema, neurologic exam is nonfocal, skin is warm and dry.   Impression & Recommendations:  Problem # 1:  DYSPNEA (ICD-786.05) is to continue to believe her shortness of breath is due to being deconditioned, obesity, COPD. Her heart rate has been improving which I hope will help with her shortness of breath with exertion. Again she did not want to do any cardiac imaging or stress testing given that she has no insurance.  Her heart rate and blood pressure are improved though still not at an ideal range. We will increase her Coreg to 25 mg b.i.d.  Problem # 2:  HYPERLIPIDEMIA (ICD-272.4) we have ordered a repeat lipid panel as her last one was December 2009. As she is diabetic, her ideal LDL should be less than  100. Last LDL was close to 120. If her repeat comes back above goal, we may have to start her on a low-dose statin.  Orders: T-Lipid Profile [id number])  Other Orders: T-Comprehensive Metabolic Panel [phone] [id number])  Patient Instructions: 1)  Your physician recommends that you schedule a follow-up appointment in: 6 months 2)  Your physician has recommended you make the following change in your medication: increase coreg to 25 mg twice a day  3)  Your physician recommends that you return for a FASTING lipid profile: at your earliest convenience (lipids, cmet) Prescriptions: CARVEDILOL 25 MG TABS (CARVEDILOL) Take one tablet by mouth twice a day  #60 x 6   Entered by:   Madeline Cross, RN, BSN   Authorized by:   Dossie Arbour MD   Signed by:   Madeline Cross, RN, BSN on 01/31/2010   Method used:   Electronically to        [facility] ([facility]       [address]       [place], [place]  [address]       Ph: [phone]       Fax: [phone]   RxID:   [id number]

## 2010-12-10 NOTE — Miscellaneous (Signed)
Summary: [facility] Professional Communication-order for home PT &  [facility] Professional Communication-order for home PT & Eval   Imported By: Beau Fanny 04/02/2010 09:00:55  _____________________________________________________________________  External Attachment:    Type:   Image     Comment:   External Document

## 2010-12-10 NOTE — Progress Notes (Signed)
Summary: CYCLOBENZAPRINE/ TRAZODONE  Phone Note Refill Request Message from:  [facility] [phone] on July 18, 2010 2:25 PM  Refills Requested: Medication #1:  TRAZODONE HCL 100 MG TABS take one half in the morning and two at bedtime   Last Refilled: 10/16/2009  Medication #2:  CYCLOBENZAPRINE HCL 10 MG  TABS 1 three times a day as needed for muscle spasm   Last Refilled: 02/08/2010  Method Requested: Electronic Initial call taken by: Mervin Hack CMA Duncan Dull),  July 18, 2010 2:27 PM  Follow-up for Phone Call        okay to refill trazodone for 1 year cyclobenzaprine just had 3 month supply 1 month ago Follow-up by: Cindee Salt MD,  July 19, 2010 7:56 AM  Additional Follow-up for Phone Call Additional follow up Details #1::        Rx faxed to pharmacy Additional Follow-up by: DeShannon Smith CMA Duncan Dull),  July 19, 2010 8:19 AM    New/Updated Medications: TRAZODONE HCL 100 MG TABS (TRAZODONE HCL) take one half in the morning and two at bedtime Prescriptions: TRAZODONE HCL 100 MG TABS (TRAZODONE HCL) take one half in the morning and two at bedtime  #75 x 12   Entered by:   Mervin Hack CMA (AAMA)   Authorized by:   Cindee Salt MD   Signed by:   Mervin Hack CMA (AAMA) on 07/19/2010   Method used:   Electronically to        [facility]       [address]       [place], [place]  [address]       Ph: [phone]       Fax: [phone]   RxID:   [id number]

## 2010-12-10 NOTE — Medication Information (Signed)
Summary: Patient Assistance Forms ([employer], [employer], Secretary/administrator, T  Patient Assistance Forms ([employer], [employer], Secretary/administrator, Technical brewer) for [facility]   Imported By: Lanelle Bal 11/26/2009 08:22:40  _____________________________________________________________________  External Attachment:    Type:   Image     Comment:   External Document

## 2010-12-10 NOTE — Assessment & Plan Note (Signed)
Summary: knot between elbow and shoulder/alc   Vital Signs:  Patient profile:   60 year old female Weight:      300 pounds Temp:     98.3 degrees F oral Pulse rate:   92 / minute Pulse rhythm:   regular BP sitting:   128 / 80  (left arm) Cuff size:   large  Vitals Entered By: Selena Batten Dance CMA Duncan Dull) (July 09, 2010 4:03 PM) CC: Knot on Right arm   History of Present Illness: CC: knot on R arm  Noticed knot on right arm yesterday evening between elbow and shoulder.  Denies injury or trauma.  No fevers.  + subjective chills.    Had nerve block on Wednesday by Dr. Metta Clines at [facility] pain clinic for chronic shoulder pain.  Shoulder actually better but then noticed this last night.  + h/o MS, in chronic pain.  Hasn't tried any warm compresses or ice yet.  Taking percocets, helping but then pain returns.    Current Medications (verified): 1)  Copaxone 20 Mg/ml Kit (Glatiramer Acetate) .Marland Kitchen.. 1 Injections Once Daily 2)  Metformin Hcl 1000 Mg Tabs (Metformin Hcl) .... Take One By Mouth Two Times A Day 3)  Lantus 100 Unit/ml  Soln (Insulin Glargine) .... Inject  50 Units  Subcutaneously Each Evening As Directed 4)  Furosemide 40 Mg  Tabs (Furosemide) .... Take One and One Half Daily 5)  Albuterol 90 Mcg/act  Aers (Albuterol) .... As Needed 6)  Allopurinol 100 Mg  Tabs (Allopurinol) .... Take 1 Tablet By Mouth Once A Day 7)  Oxycodone-Acetaminophen 5-325 Mg  Tabs (Oxycodone-Acetaminophen) .Marland Kitchen.. 1-2 Tablests By Mouth Every 4 Hours As Needed For Pain 8)  Klor-Con M10 10 Meq  Tbcr (Potassium Chloride Crys Cr) .... Take 1 Tablet By Mouth Two Times A Day 9)  Diazepam 5 Mg  Tabs (Diazepam) .... 1/2 -1 Two Times A Day As Needed For Nerves 10)  Cyclobenzaprine Hcl 10 Mg  Tabs (Cyclobenzaprine Hcl) .Marland Kitchen.. 1 Three Times A Day As Needed For Muscle Spasm 11)  Meloxicam 15 Mg  Tabs (Meloxicam) .Marland Kitchen.. 1 Daily As Needed For Pain 12)  Trazodone Hcl 100 Mg Tabs (Trazodone Hcl) .... Take One Half in The Morning and Two  At Bedtime 13)  Levothyroxine Sodium 175 Mcg Tabs (Levothyroxine Sodium) .Marland Kitchen.. 1 Tab Daily 14)  Gabapentin 300 Mg Caps (Gabapentin) .... Take 1 Tablet in Am and 3 Tablets At Bedtime 15)  Carvedilol 25 Mg Tabs (Carvedilol) .... Take One Tablet By Mouth Twice A Day 16)  Femara 2.5 Mg Tabs (Letrozole) .... Take 1 Tablet By Mouth Once Daily 17)  Nicotine 14 Mg/24hr Pt24 (Nicotine) .... Use As Directed 18)  Provigil 100 Mg Tabs (Modafinil) .... Take One Tablet By Mouth Daily 19)  Symbicort 160-4.5 Mcg/act Aero (Budesonide-Formoterol Fumarate) .... Take One Puff Two Times A Day 20)  Omeprazole 20 Mg Cpdr (Omeprazole) .... Take 1 By Mouth Once Daily 21)  Aspirin 325 Mg  Tbec (Aspirin) .... Take One By Mouth Once A Day 22)  Tylenol Pm Extra Strength 500-25 Mg  Tabs (Diphenhydramine-Apap (Sleep)) .... As Needed 23)  Fish Oil  Oil (Fish Oil) .... Take 1 Tablet  By Mouth Once Daily 24)  Calcium-Vitamin D 600-200 Mg-Unit Tabs (Calcium-Vitamin D) .... Take One Tablet By Mouth Daily 25)  Bd Pen Needle Ultrafine 29g X 12.84mm Misc (Insulin Pen Needle) .... Patient Test 3-4 Times Daily 26)  Novolog Flexpen 100 Unit/ml Soln (Insulin Aspart) .... Inject 30 Units  With Each Meal  Allergies: 1)  ! * Penicillin 2)  * Trovan 3)  * Avandia 4)  * Percocet PMH-FH-SH reviewed for relevance  Physical Exam  General:  alert.  NAD, obese, shakes hand with left arm  Msk:  limited ROM bilaterally at shoulders, but equal on both sides.   R arm - Tender to palpation soft tissue at height of right mid humerus, no point tenderness.  ? possible small inflamed nodule at depth of subcutaneous fat.  no erythema on skin, no ecchymosis.  not point tender at humerus. L arm - also tender with deep palpation at similar location as R, but no nodule appreciated. Skin:  small ecchymosis right trap from where pain clinic injection was done on Wednesday.   Impression & Recommendations:  Problem # 1:  ARM PAIN, RIGHT (ICD-729.5) ?  inflamed scar tissue.  treat symptomatically.  not point tender, doubt fracture or other pathology.  No evidence of DVT on exam today.  Pt under comprehensive pain regimen by PCP and pain clinic, on NSAIDs, percocets + tylenol, gabapentin, lidoderm patches, muscle relaxant, benzo, trazodone, ASA.  unsure where else to go from here.  rec alternate ice/warm compresses to see if any symptomatic relief.  If not improved with this and pain continues/worsens, could consider xray to r/o fracture although not likely by clinical exam today.  if not improved, would also investigate where insulin and glatiramer injections are given to patient, see if at site of pain.  Problem # 2:  TOBACCO ABUSE (ICD-305.1) requests 21mg  nicotine patch.  says used to smoke only 1 ppd, but feels 14mg  is not enough, and still gets craving when around other smokers.  advised increased patch will likely not help with this, but reasonable to try trial of higher dose.  provided with script. Her updated medication list for this problem includes:    Nicotine 21 Mg/24hr Pt24 (Nicotine) ..... Use as directed, qs 1 month  Complete Medication List: 1)  Copaxone 20 Mg/ml Kit (Glatiramer acetate) .Marland Kitchen.. 1 injections once daily 2)  Metformin Hcl 1000 Mg Tabs (Metformin hcl) .... Take one by mouth two times a day 3)  Lantus 100 Unit/ml Soln (Insulin glargine) .... Inject  50 units  subcutaneously each evening as directed 4)  Furosemide 40 Mg Tabs (Furosemide) .... Take one and one half daily 5)  Albuterol 90 Mcg/act Aers (Albuterol) .... As needed 6)  Allopurinol 100 Mg Tabs (Allopurinol) .... Take 1 tablet by mouth once a day 7)  Oxycodone-acetaminophen 5-325 Mg Tabs (Oxycodone-acetaminophen) .Marland Kitchen.. 1-2 tablests by mouth every 4 hours as needed for pain 8)  Klor-con M10 10 Meq Tbcr (Potassium chloride crys cr) .... Take 1 tablet by mouth two times a day 9)  Diazepam 5 Mg Tabs (Diazepam) .... 1/2 -1 two times a day as needed for nerves 10)   Cyclobenzaprine Hcl 10 Mg Tabs (Cyclobenzaprine hcl) .Marland Kitchen.. 1 three times a day as needed for muscle spasm 11)  Meloxicam 15 Mg Tabs (Meloxicam) .Marland Kitchen.. 1 daily as needed for pain 12)  Trazodone Hcl 100 Mg Tabs (Trazodone hcl) .... Take one half in the morning and two at bedtime 13)  Levothyroxine Sodium 175 Mcg Tabs (Levothyroxine sodium) .Marland Kitchen.. 1 tab daily 14)  Gabapentin 300 Mg Caps (Gabapentin) .... Take 1 tablet in am and 3 tablets at bedtime 15)  Carvedilol 25 Mg Tabs (Carvedilol) .... Take one tablet by mouth twice a day 16)  Femara 2.5 Mg Tabs (Letrozole) .... Take 1 tablet by  mouth once daily 17)  Nicotine 21 Mg/24hr Pt24 (Nicotine) .... Use as directed, qs 1 month 18)  Provigil 100 Mg Tabs (Modafinil) .... Take one tablet by mouth daily 19)  Symbicort 160-4.5 Mcg/act Aero (Budesonide-formoterol fumarate) .... Take one puff two times a day 20)  Omeprazole 20 Mg Cpdr (Omeprazole) .... Take 1 by mouth once daily 21)  Aspirin 325 Mg Tbec (Aspirin) .... Take one by mouth once a day 22)  Tylenol Pm Extra Strength 500-25 Mg Tabs (Diphenhydramine-apap (sleep)) .... As needed 23)  Fish Oil Oil (Fish oil) .... Take 1 tablet  by mouth once daily 24)  Calcium-vitamin D 600-200 Mg-unit Tabs (Calcium-vitamin d) .... Take one tablet by mouth daily 25)  Bd Pen Needle Ultrafine 29g X 12.34mm Misc (Insulin pen needle) .... Patient test 3-4 times daily 26)  Novolog Flexpen 100 Unit/ml Soln (Insulin aspart) .... Inject 30 units with each meal   Patient Instructions: 1)  I think you have some inflamed scar tissue on your arm.  Treat with alternating heat and ice pack - see which makes it feel better then continue with that. 2)  It does not feel like a blood clot today. 3)  Call clinic with questions. 4)  New script for nicotine patch provided Prescriptions: NICOTINE 21 MG/24HR PT24 (NICOTINE) use as directed, qs 1 month  #1 x 3   Entered and Authorized by:   Eustaquio Boyden  MD   Signed by:   Eustaquio Boyden  MD on 07/09/2010   Method used:   Print then Give to Patient   RxID:   [id number]   Current Allergies (reviewed today): ! * PENICILLIN * TROVAN * AVANDIA * PERCOCET  Appended Document: knot between elbow and shoulder/alc sensation intact, 2+ rad pulses, good cap refill.

## 2010-12-10 NOTE — Progress Notes (Signed)
Summary: refill request for percocet  Phone Note Refill Request Call back at Home Phone [phone] Message from:  Patient  Refills Requested: Medication #1:  OXYCODONE-ACETAMINOPHEN 5-325 MG  TABS 1-2 tablests by mouth every 4 hours as needed for pain Please call pt when ready.  Initial call taken by: Lowella Petties CMA,  June 24, 2010 4:30 PM  Follow-up for Phone Call        Rx written Follow-up by: Cindee Salt MD,  June 25, 2010 8:09 AM  Additional Follow-up for Phone Call Additional follow up Details #1::        Spoke with patient and advised rx ready for pick-up  Additional Follow-up by: Mervin Hack CMA (AAMA),  June 25, 2010 8:21 AM    New/Updated Medications: OXYCODONE-ACETAMINOPHEN 5-325 MG  TABS (OXYCODONE-ACETAMINOPHEN) 1-2 tablests by mouth every 4 hours as needed for pain Prescriptions: OXYCODONE-ACETAMINOPHEN 5-325 MG  TABS (OXYCODONE-ACETAMINOPHEN) 1-2 tablests by mouth every 4 hours as needed for pain  #60 x 0   Entered and Authorized by:   Cindee Salt MD   Signed by:   Cindee Salt MD on 06/25/2010   Method used:   Print then Give to Patient   RxID:   [id number]

## 2010-12-10 NOTE — Letter (Signed)
Summary: Pain Ctr Eval/[facility]  Pain Ctr Eval/[facility]   Imported By: Sherian Rein 08/22/2010 09:25:42  _____________________________________________________________________  External Attachment:    Type:   Image     Comment:   External Document  Appended Document: Pain Ctr Eval/[facility] multiple nerve blocks done

## 2010-12-10 NOTE — Letter (Signed)
Summary: [facility] Evaluation  [facility] Evaluation   Imported By: Maryln Gottron 08/12/2010 13:13:08  _____________________________________________________________________  External Attachment:    Type:   Image     Comment:   External Document  Appended Document: [facility] Evaluation planning interventions at next visit

## 2010-12-10 NOTE — Progress Notes (Signed)
Summary: Zostavax/ [facility]  Phone Note Outgoing Call   Call placed by: Mervin Hack CMA Duncan Dull),  August 28, 2010 4:47 PM Call placed to: [facility] [phone] Summary of Call: [facility] cancer center to see if pt really did get the Zoster vaccine that's noted in the 07/25/2010 office note. Per Josh @ [facility],  pt DID NOT receive the vaccine. They state that statement should have been taken out of the office note on 07/25/2010, per Josh pt orders were canceled. Pt did get the FLU vaccine. Initial call taken by: Mervin Hack CMA Duncan Dull),  August 28, 2010 4:49 PM  Follow-up for Phone Call        I will take the 07/25/2010 date out of pt's immunization record and the 08/28/2010 record will be correct. DeShannon Katrinka Blazing CMA Duncan Dull)  August 28, 2010 4:51 PM   great Not an issue that we gave it to her then that is appropriate Follow-up by: Cindee Salt MD,  August 28, 2010 8:39 PM

## 2010-12-10 NOTE — Progress Notes (Signed)
Summary: refill request for oxycodone  Phone Note Refill Request Call back at Home Phone [phone] Message from:  Patient  Refills Requested: Medication #1:  OXYCODONE-ACETAMINOPHEN 5-325 MG  TABS 1-2 tablests by mouth every 4 hours as needed for pain Please call pt when ready.  Pt thought that the scripts she gets for # 60 are supposed to last her a month, so she has only been taking 2 a day and stays in a lot of  pain.  I explained to pt that she can take 1-2 every 4-6 hours as prescribed, she does not have to stretch the script out for a month.  Initial call taken by: Lowella Petties CMA,  July 24, 2010 1:03 PM  Follow-up for Phone Call        Rx written Increased to #90 Follow-up by: Cindee Salt MD,  July 24, 2010 2:00 PM  Additional Follow-up for Phone Call Additional follow up Details #1::        Spoke with patient and advised rx ready for pick-up, pt requested that I give rx to her sister-in-law Artelia Laroche so she may get it from her later. Per Dr.Jeylin Woodmansee that is ok. Additional Follow-up by: Mervin Hack CMA Duncan Dull),  July 24, 2010 3:18 PM    New/Updated Medications: OXYCODONE-ACETAMINOPHEN 5-325 MG  TABS (OXYCODONE-ACETAMINOPHEN) 1-2 tablests by mouth every 4 hours as needed for pain Prescriptions: OXYCODONE-ACETAMINOPHEN 5-325 MG  TABS (OXYCODONE-ACETAMINOPHEN) 1-2 tablests by mouth every 4 hours as needed for pain  #90 x 0   Entered and Authorized by:   Cindee Salt MD   Signed by:   Cindee Salt MD on 07/24/2010   Method used:   Print then Give to Patient   RxID:   [id number]

## 2010-12-10 NOTE — Progress Notes (Signed)
Summary: PHI  PHI   Imported By: Harlon Flor 01/03/2010 11:12:03  _____________________________________________________________________  External Attachment:    Type:   Image     Comment:   External Document

## 2010-12-10 NOTE — Progress Notes (Signed)
Summary: humalog  Phone Note Call from Patient   Caller: [facility]/ [employer] Call For: Madeline [name] Summary of Call: Prior Authorization is required for Humalog. Form is on your desk.  Initial call taken by: Melody Comas,  July 03, 2010 10:10 AM  Follow-up for Phone Call        they don't list what their preferred drugs are Please check to see what they are and see which other insulins she has used Madeline [name]  July 04, 2010 12:55 PM   I attached the list of preferred drugs to the prior auth and it's on your desk. I call pt to ask if there was a reason she was using the pen versus the vial, she said it was for convience, that she has so many appts, that she has to take her insulin with her and she can't take the vial because it has to stay cold. Pt states she's been using the pen since 1993. DeShannon Katrinka Blazing CMA Duncan Dull)  July 05, 2010 11:03 AM   Additional Follow-up for Phone Call Additional follow up Details #1::        Please ask her if she can fill syringes with insulin herself If so, change to humalog vial at the same dose  If she would have trouble with this, change to novolog flex pen at the same dose---since this is preferred Madeline [name]  July 08, 2010 1:42 PM  spoke with pt and she will try the novolog flex pen, she has to take her insulin around with her and the vials would need to stay cold so she declined the vials. [facility]  Additional Follow-up by: Mervin Hack CMA Duncan Dull),  July 09, 2010 10:55 AM    New/Updated Medications: NOVOLOG FLEXPEN 100 UNIT/ML SOLN (INSULIN ASPART) inject 30 units with each meal Prescriptions: NOVOLOG FLEXPEN 100 UNIT/ML SOLN (INSULIN ASPART) inject 30 units with each meal  #90day x 3   Entered by:   Mervin Hack CMA (AAMA)   Authorized by:   Madeline [name]   Signed by:   Mervin Hack CMA (AAMA) on 07/09/2010   Method used:   [facility]ronically to        [facility] [address]       [address]       [place], [place]  [address]       Ph: [phone]       Fax: [phone]   RxID:   [id number]

## 2010-12-10 NOTE — Progress Notes (Signed)
Summary: patient's appt  Phone Note Call from Patient Call back at Home Phone [phone]   Caller: Patient Call For: Cindee Salt MD Summary of Call: Patient called this morning and spoke w/ Jacki Cones  asking for an appt. with Dr. Alphonsus Sias. Patient was offered an appt. and she refused it. She then called back hours later and spoke w/ me asking for a different time for today. I explained to her that Dr. Alphonsus Sias is leaving early today. Patient was added to Dr. Nicanor Alcon schedule for today at 4:15. Initial call taken by: Melody Comas,  July 09, 2010 1:43 PM  Follow-up for Phone Call        thank you Follow-up by: Cindee Salt MD,  July 09, 2010 1:45 PM

## 2010-12-10 NOTE — Miscellaneous (Signed)
Summary: Care Plan/[facility]  Care Plan/[facility]   Imported By: Lanelle Bal 08/07/2010 10:51:15  _____________________________________________________________________  External Attachment:    Type:   Image     Comment:   External Document

## 2010-12-10 NOTE — Letter (Signed)
Summary: [facility]  [facility]   Imported By: Maryln Gottron 09/26/2010 13:51:15  _____________________________________________________________________  External Attachment:    Type:   Image     Comment:   External Document  Appended Document: [facility] considering suprascapular nerve block

## 2010-12-10 NOTE — Miscellaneous (Signed)
Summary: SN Orders/[facility]  SN Orders/[facility]   Imported By: Lanelle Bal 04/10/2010 09:43:11  _____________________________________________________________________  External Attachment:    Type:   Image     Comment:   External Document

## 2010-12-10 NOTE — Progress Notes (Signed)
Summary: Medications  Phone Note Call from Patient Call back at Home Phone [phone]   Caller: Patient Call For: Cindee Salt MD Summary of Call: patient calling wanting medications, she can't get here for an appt until Monday or Tuesday. I advised pt that she should call the hospital and get scripts from them. Patient states she doesn't drive and that her daughter doesn't get off work until 5:00 and she couldn't make an appt until around 6:00pm. Pt would like refills until she can come in. Initial call taken by: Mervin Hack CMA Duncan Dull),  Mar 28, 2010 4:14 PM  Follow-up for Phone Call        okay to check her discharge summary and fill 1 month of each without refill she has been a long time patient and while I don't agree with her leaving rehab, I want to try to maintain continuity She really needs to get into the office ASAP though Follow-up by: Cindee Salt MD,  Mar 28, 2010 4:44 PM  Additional Follow-up for Phone Call Additional follow up Details #1::        spoke with patient, scheduled appt Monday 04/01/2010 @ 2:00pm ( I sch ) patient stated that she scheduled [facility] ride with [facility]. I called [facility] and they do not have pt scheduled for Monday, [facility] stated that if she's a medicaid rider she would have to book thru medicaid. DeShannon Smith CMA Duncan Dull)  Mar 29, 2010 9:22 AM   Aram Beecham called Medicaid and spoke with Britta Mccreedy and she called [facility] and they will arrange pick-up. Ok to call in 30day supply of meds? DeShannon Smith CMA Duncan Dull)  Mar 29, 2010 9:41 AM   yes Additional Follow-up by: Cindee Salt MD,  Mar 29, 2010 10:34 AM    Additional Follow-up for Phone Call Additional follow up Details #2::    per [facility] at [facility], patient [facility] medications [facility], also the oncologist sent medications to [facility]. Per Dr. Alphonsus Sias ok to wait until [facility] office visit. Pt got refills 06/2009 enough to last a year, they were sent to [facility] also. DeShannon Smith  CMA Duncan Dull),  Mar 29, 2010 3:27 PM  Spoke with patient and advised results. She will have her daughter check this weekend. Follow-up by: Mervin Hack CMA Duncan Dull),  Mar 29, 2010 3:29 PM

## 2010-12-10 NOTE — Letter (Signed)
Summary: [facility] Evaluation  [facility] Evaluation   Imported By: Maryln Gottron 10/04/2010 10:22:03  _____________________________________________________________________  External Attachment:    Type:   Image     Comment:   External Document  Appended Document: [facility] Evaluation several nerve blocks done

## 2010-12-10 NOTE — Assessment & Plan Note (Signed)
Summary: ROA FOR 1 MONTH FOLLOW-UP/JRR   Vital Signs:  Patient profile:   60 year old female Weight:      286 pounds Temp:     98.6 degrees F oral Pulse rate:   88 / minute Pulse rhythm:   regular BP sitting:   110 / 68  (left arm) Cuff size:   large  Vitals Entered By: Mervin Hack CMA Duncan Dull) (September 24, 2010 10:18 AM) CC: 1 month follow-up   History of Present Illness: Having some trouble wiht her eyes---pain from the MS Right foot pain is dragging some also Is getting an Nutritional therapist for mobility evaluation Plans to move to daughter's house  [employer] will build her a ramp  Feels great back on the fluoxetine depression is much better "It is obviously something I need"  Did increase the lantus to 80 units AM sugars now often under 120 and usually under 140  Allergies: 1)  ! * Penicillin 2)  * Trovan 3)  * Avandia 4)  * Percocet  Past History:  Past medical, surgical, family and social histories (including risk factors) reviewed for relevance to current acute and chronic problems.  Past Medical History: Reviewed history from 04/01/2010 and no changes required. Depression Multi-Nodular Goiter Dyslipidemia NIDDM with neuropathy Hypertension Allergic rhinitis GERD Urinary incontinence Peripheral neuropathy Multiple sclerosis Breast cancer Hypothyroidism Asthma Right leg Deep Vein Thrombosis in 1990s, COPD  Past Surgical History: Reviewed history from 03/15/2010 and no changes required. Thyroidectomy (09/2004) Transthoracic Echocardiogram (05/16/2004) Partial Hysterectomy 1975 Excision biopsy growth right axilla, benign 1993 Throat growth (benign) 1989 Thyroid biopsy 10/99 Vertigo- MRI/MRA/ECHO/Carotids 07/05 Carpal tunnel release bilat (5-6/09) Left modified radical mastectomy--4/10   Dr Renda Rolls Tonsillectomy Appendectomy  Family History: Reviewed history and no changes required.  Social History: Reviewed history from  12/22/2008 and no changes required. Widowed 1999 then 2nd Marriage--2000. Widowed again 2009 Children: 4 Occupation: works in Community education officer office---currently disabled Former Smoker--quit  ~2005 Alcohol use-no   Impression & Recommendations:  Problem # 1:  DEPRESSION (ICD-311) Assessment Improved markedly improved back on the medication needs to take indefinitely counselled more than half of the 15 minute visit  Her updated medication list for this problem includes:    Fluoxetine Hcl 20 Mg Tabs (Fluoxetine hcl) .Marland Kitchen... 1 tab daily for depression    Diazepam 5 Mg Tabs (Diazepam) .Marland Kitchen... 1/2 -1 two times a day as needed for nerves    Trazodone Hcl 100 Mg Tabs (Trazodone hcl) .Marland Kitchen... Take one half in the morning and two at bedtime  Complete Medication List: 1)  Fluoxetine Hcl 20 Mg Tabs (Fluoxetine hcl) .Marland Kitchen.. 1 tab daily for depression 2)  Copaxone 20 Mg/ml Kit (Glatiramer acetate) .Marland Kitchen.. 1 injections once daily 3)  Metformin Hcl 1000 Mg Tabs (Metformin hcl) .... Take one by mouth two times a day 4)  Lantus 100 Unit/ml Soln (Insulin glargine) .... Inject  80 units  subcutaneously each evening as directed 5)  Furosemide 40 Mg Tabs (Furosemide) .... Take one and one half daily 6)  Allopurinol 100 Mg Tabs (Allopurinol) .... Take 1 tablet by mouth once a day 7)  Oxycodone-acetaminophen 5-325 Mg Tabs (Oxycodone-acetaminophen) .Marland Kitchen.. 1-2 tablests by mouth every 4 hours as needed for pain 8)  Klor-con M10 10 Meq Tbcr (Potassium chloride crys cr) .... Take 1 tablet by mouth two times a day 9)  Diazepam 5 Mg Tabs (Diazepam) .... 1/2 -1 two times a day as needed for nerves 10)  Cyclobenzaprine Hcl 10 Mg Tabs (Cyclobenzaprine  hcl) .... 1 three times a day as needed for muscle spasm 11)  Meloxicam 15 Mg Tabs (Meloxicam) .Marland Kitchen.. 1 daily as needed for pain 12)  Trazodone Hcl 100 Mg Tabs (Trazodone hcl) .... Take one half in the morning and two at bedtime 13)  Levothyroxine Sodium 175 Mcg Tabs (Levothyroxine sodium)  .Marland Kitchen.. 1 tab daily 14)  Gabapentin 300 Mg Caps (Gabapentin) .... Take 1 tablet in am and 3 tablets at bedtime 15)  Carvedilol 25 Mg Tabs (Carvedilol) .... Take one tablet by mouth twice a day 16)  Femara 2.5 Mg Tabs (Letrozole) .... Take 1 tablet by mouth once daily 17)  Nicotine 21 Mg/24hr Pt24 (Nicotine) .... Use as directed, qs 1 month 18)  Symbicort 160-4.5 Mcg/act Aero (Budesonide-formoterol fumarate) .... Take one puff two times a day 19)  Omeprazole 20 Mg Cpdr (Omeprazole) .... Take 1 by mouth once daily 20)  Bd Pen Needle Ultrafine 29g X 12.47mm Misc (Insulin pen needle) .... Patient test 3-4 times daily 21)  Novolog Flexpen 100 Unit/ml Soln (Insulin aspart) .... Inject 30 units with each meal 22)  Aspirin 325 Mg Tbec (Aspirin) .... Take one by mouth once a day 23)  Fish Oil Oil (Fish oil) .... Take 1 tablet  by mouth once daily 24)  Calcium-vitamin D 600-200 Mg-unit Tabs (Calcium-vitamin d) .... Take one tablet by mouth daily 25)  Spiriva Handihaler 18 Mcg Caps (Tiotropium bromide monohydrate) .... As needed  Patient Instructions: 1)  Please call if your mood worsens again--we may need to increase the fluoxetine  2)  Please schedule a follow-up appointment in 4 months .    Orders Added: 1)  Est. Patient Level III [16109]    Current Allergies (reviewed today): ! * PENICILLIN * TROVAN * AVANDIA * PERCOCET

## 2010-12-10 NOTE — Progress Notes (Signed)
Summary: flexeril  Phone Note Refill Request Message from:  [employer] on July 29, 2010 8:43 AM  Refills Requested: Medication #1:  CYCLOBENZAPRINE HCL 10 MG  TABS 1 three times a day as needed for muscle spasm   Supply Requested: 1 month   Last Refilled: 02/15/[facility] [phone]   Method Requested: Electronic Initial call taken by: Benny Lennert CMA Duncan Dull),  July 29, 2010 8:44 AM  Follow-up for Phone Call        okay #[phone] Follow-up by: Cindee Salt MD,  July 29, 2010 1:54 PM  Additional Follow-up for Phone Call Additional follow up Details #1::        Rx faxed to pharmacy Additional Follow-up by: DeShannon Smith CMA Duncan Dull),  July 29, 2010 2:42 PM    Prescriptions: CYCLOBENZAPRINE HCL 10 MG  TABS (CYCLOBENZAPRINE HCL) 1 three times a day as needed for muscle spasm  #270 x 0   Entered by:   Mervin Hack CMA (AAMA)   Authorized by:   Cindee Salt MD   Signed by:   Mervin Hack CMA (AAMA) on 07/29/2010   Method used:   [facility]       [address]       [place], [place]  [address]       Ph: [phone]       Fax: [phone]   RxID:   [id number]

## 2010-12-10 NOTE — Letter (Signed)
Summary: CMN for Diabetes Supplies/[facility]  CMN for Diabetes Supplies/[facility]   Imported By: Lanelle Bal 04/10/2010 10:01:35  _____________________________________________________________________  External Attachment:    Type:   Image     Comment:   External Document

## 2010-12-10 NOTE — Progress Notes (Signed)
Summary: Lift Chair  Phone Note Other Incoming   Caller: Reuel Boom @ [facility] [phone] ext [phone] Summary of Call: [facility] calling asking [place] to help them explain to pt that she can not get her husband old lift chair repaired. Pt is demanding that they repair her husband lift chair that she uses, pt has [employer] and they will not pay for that per [facility]. Pt then told them she would get an order from her primary physican. [facility] have told her over and over that [employer] will not pay for a lift chair unless she pays up front and the dr's order willl only take off the sales tax. [facility] can't get her to understand that she doesn't meet the needs financially to pay for a lift chair up front. Pt is demanding and will not stop calling trying to get the chair, she either would like a new chair or get her husbands chair repaired, per [employer] pt can't use her insurance to pay for a chair that was ordered for someone else. Please advise. Initial call taken by: Mervin Hack CMA Duncan Dull),  July 31, 2010 8:15 AM  Follow-up for Phone Call        Please call and confirm that the insurance will not pay to repair an old lift chair  Her insurance will only cover the sales tax for a lift chair, not the chair itself Cindee Salt MD  July 31, 2010 1:55 PM   if the original lift chair was ordered for her, then yes her insurance would pay, but the chair was ordered for her huband who is deceased and had a different insurance. Her insurance can't cover repairs on someone else's equipment. DeShannon Smith CMA Duncan Dull)  July 31, 2010 3:06 PM   Yes, please call her to confirm this from our standpoint Cindee Salt MD  August 01, 2010 1:25 PM   Additional Follow-up for Phone Call Additional follow up Details #1::        Line busy X 2. Sydell Axon LPN  August 05, 2010 9:16 AM  Patient notified as instructed by telephone. Was informed by patient that she has also talked  with her insurance company and is aware of this. Additional Follow-up by: Sydell Axon LPN,  August 05, 2010 4:39 PM

## 2010-12-10 NOTE — Assessment & Plan Note (Signed)
Summary: 2:00 HOSPITAL FOLLOW-UP/DS   Vital Signs:  Patient profile:   60 year old female Height:      67 inches Weight:      295.25 pounds BMI:     46.41 Temp:     98.2 degrees F oral Pulse rate:   80 / minute Pulse rhythm:   regular BP sitting:   130 / 82  (right arm) Cuff size:   large  Vitals Entered By: Mervin Hack CMA Duncan Dull) (Apr 01, 2010 2:03 PM) CC: hospital follow up   History of Present Illness: Here with daughter Hospitalized with dyspnea She states it is from asthma breathing treatments did help Stopped smoking  MS seems to be quiet Dr Kemper Durie does not think she was exacerbated Feels like her energy levels are better in the afternoon wit the provigil  Acid reflux is controlled with protonix Ranitidine did not control her symptoms did do okay on omeprazole  Still quite weak but some better Now using rollator walker Living at [facility] [address] has shower seat, raised toilet seat Still feels weak Face to face evaluation confirms ongoing weakness. Incomplete improvement with inpatient rehab. Is essentially homebound since cannot leave without sig assistance  Has 3 more weeks of chemo for breast cancer---then is supposed to be different  Still uses oxycodone for hands and back Muscle spasm in left chest ---relates to mastectomy and pectoralis involvement with surgery  diabetes control has been good New meter which keeps records 85- 260s No hypoglycemia generally checking two times a day  occ adjusts the humalog  Allergies: 1)  ! * Penicillin 2)  * Trovan 3)  * Avandia 4)  * Percocet  Past History:  Past Medical History: Depression Multi-Nodular Goiter Dyslipidemia NIDDM with neuropathy Hypertension Allergic rhinitis GERD Urinary incontinence Peripheral neuropathy Multiple sclerosis Breast cancer Hypothyroidism Asthma Right leg Deep Vein Thrombosis in 1990s, COPD  Review of Systems       new rash on left forearm.  Itching at first, better now. No pain Eating well Weight has been stable now sleeping better  Physical Exam  General:  alert and normal appearance.   Neck:  supple, no masses, no thyromegaly, and no cervical lymphadenopathy.   Lungs:  normal respiratory effort, no intercostal retractions, and no accessory muscle use.  Decreased breath sounds but clear Heart:  normal rate, regular rhythm, no murmur, and no gallop.   Abdomen:  soft and non-tender.   Extremities:  1+ edema Psych:  normally interactive, good eye contact, not anxious appearing, and not depressed appearing.     Impression & Recommendations:  Problem # 1:  COPD (ICD-496) Assessment Improved doing better now has stopped smoking continue current care still with poor functional status getting RN visits at home and will start PT there also  Her updated medication list for this problem includes:    Albuterol 90 Mcg/act Aers (Albuterol) .Marland Kitchen... As needed    Symbicort 160-4.5 Mcg/act Aero (Budesonide-formoterol fumarate) .Marland Kitchen... 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 Take one by mouth two times a day    Humalog Pen  100 Unit/ml Soln (Insulin lispro (human)) ..... Inject 30 units with each meal    Lantus 100 Unit/ml Soln (Insulin glargine) ..... Inject  50 units  subcutaneously each evening as directed    Aspirin 325 Mg Tbec (Aspirin) .Marland Kitchen... Take one by mouth once a day  Labs Reviewed: Creat: 1.07 (12/31/2009)    Reviewed HgBA1c results: 9.9 (06/01/2009)  7.0 (10/30/2008)  Orders: TLB-A1C / Hgb A1C  (Glycohemoglobin) (83036-A1C)  Problem # 4:  GERD (ICD-530.81) Assessment: Unchanged okay on meds failed ranitidine   Her updated medication list for this problem includes:    Protonix 40 Mg Tbec (Pantoprazole sodium) .Marland Kitchen... 1 by mouth once daily  Problem # 5:  DEPRESSION (ICD-311) Assessment: Unchanged mood is better now  Her updated medication list for this problem includes:    Diazepam 5 Mg Tabs (Diazepam) .Marland Kitchen... 1/2 -1 two times a day as needed for nerves    Trazodone Hcl 100 Mg Tabs (Trazodone hcl) .Marland Kitchen... Take one half in the morning and two at bedtime  Problem # 6:  ADENOCARCINOMA, BREAST, LEFT (ICD-174.9) Assessment: Comment Only still getting chemo Dr Orlie Dakin manages  Complete Medication List: 1)  Metformin Hcl 1000 Mg Tabs (Metformin hcl) .... Take one by mouth two times a day 2)  Humalog Pen 100 Unit/ml Soln (Insulin lispro (human)) .... Inject 30 units with each meal 3)  Lantus 100 Unit/ml Soln (Insulin glargine) .... Inject  50 units  subcutaneously each evening as directed 4)  Bd Ultra-fine Iii Mini Pen Needles 3/16 Inch 31 Guage  .... Use three- four  times daily as directed 5)  Aspirin 325 Mg Tbec (Aspirin) .... Take one by mouth once a day 6)  Furosemide 40 Mg Tabs (Furosemide) .... Take one and one half daily 7)  Tylenol Pm Extra Strength 500-25 Mg Tabs (Diphenhydramine-apap (sleep)) .... As needed 8)  Albuterol 90 Mcg/act Aers (Albuterol) .... As needed 9)  Allopurinol 100 Mg Tabs (Allopurinol) .... Take 1 tablet by mouth once a day 10)  Oxycodone-acetaminophen 5-325 Mg Tabs (Oxycodone-acetaminophen) .Marland Kitchen.. 1-2 tablests by mouth every 4 hours as needed for pain 11)  Klor-con M10 10 Meq Tbcr (Potassium chloride crys cr) .... Take 1 tablet by mouth two times a day 12)  Diazepam 5 Mg Tabs (Diazepam) .... 1/2 -1 two times a day as needed for nerves 13)  Cyclobenzaprine Hcl 10 Mg Tabs (Cyclobenzaprine hcl) .Marland Kitchen.. 1 three times a day as needed for muscle spasm 14)   Meloxicam 15 Mg Tabs (Meloxicam) .Marland Kitchen.. 1 daily as needed for pain 15)  Copaxone 20 Mg/ml Kit (Glatiramer acetate) .Marland Kitchen.. 1 injection [place] daily, disp qs 16)  Trazodone Hcl 100 Mg Tabs (Trazodone hcl) .... Take one half in the morning and two at bedtime 17)  Fish Oil Oil (Fish oil) .... Take 1 tablet  by mouth once daily 18)  Protonix 40 Mg Tbec (Pantoprazole sodium) .Marland Kitchen.. 1 by mouth once daily 19)  Levothyroxine Sodium 175 Mcg Tabs (Levothyroxine sodium) .Marland Kitchen.. 1 tab daily 20)  Gabapentin 300 Mg Caps (Gabapentin) .... Take 1 tablet by mouth two times a day 21)  Carvedilol 25 Mg Tabs (Carvedilol) .... Take one tablet by mouth twice a day 22)  Femara 2.5 Mg Tabs (Letrozole) .... Take 1 tablet by mouth once daily 23)  Flexeril 10 Mg Tabs (Cyclobenzaprine hcl) .... Take 1 tablet by mouth every eight hours as needed 24)  Nicotine 14 Mg/24hr Pt24 (Nicotine) .... Use as directed 25)  Provigil 100 Mg Tabs (Modafinil) .Marland KitchenMarland KitchenMarland Kitchen  Take one tablet by mouth daily 26)  Calcium-vitamin D 600-200 Mg-unit Tabs (Calcium-vitamin d) .... Take one tablet by mouth daily 27)  Symbicort 160-4.5 Mcg/act Aero (Budesonide-formoterol fumarate) .... Take one puff two times a day  Other Orders: TLB-T4 (Thyrox), Free [phone]) TLB-TSH (Thyroid Stimulating Hormone) (84443-TSH) TLB-Renal Function Panel (80069-RENAL) TLB-CBC Platelet - w/Differential (85025-CBCD) TLB-Hepatic/Liver Function Pnl (80076-HEPATIC) Venipuncture (30865)  Patient Instructions: 1)  Please schedule a follow-up appointment in 4-6  weeks.  2)  Referral Appointment Information 3)  Day/Date: 4)  Time: 5)  Place/MD: 6)  Address: 7)  Phone/Fax: 8)  Patient given appointment information. Information/Orders faxed/mailed.   Current Allergies (reviewed today): ! * PENICILLIN * TROVAN * AVANDIA * PERCOCET  Prevention & Chronic Care Immunizations   Influenza vaccine: Fluvax 3+  (08/27/2007)    Tetanus booster: 06/27/2004: Td    Pneumococcal vaccine:  Pneumovax  (08/11/2003)    H. zoster vaccine: Not documented  Colorectal Screening   Hemoccult: Not documented    Colonoscopy: Not documented  Other Screening   Pap smear: Not documented    Mammogram: BI-RADS CATEGORY 5:  Highly suggestive of malignancy - appropriate^MM DIGITAL DIAGNOSTIC BILAT  (01/31/2009)    DXA bone density scan: Not documented   Smoking status: quit  (01/02/2010)  Diabetes Mellitus   HgbA1C: 9.9  (06/01/2009)    Eye exam: Not documented    Foot exam: yes  (03/22/2009)   High risk foot: Not documented   Foot care education: Not documented    Urine microalbumin/creatinine ratio: 8.2  (12/15/2006)  Lipids   Total Cholesterol: 188  (10/30/2008)   LDL: 116  (10/30/2008)   LDL Direct: 143.8  (06/27/2008)   HDL: 35.0  (10/30/2008)   Triglycerides: 186  (10/30/2008)    SGOT (AST): 75  (12/31/2009)   SGPT (ALT): 88  (12/31/2009)   Alkaline phosphatase: 77  (12/31/2009)   Total bilirubin: 0.3  (12/31/2009)  Hypertension   Last Blood Pressure: 130 / 82  (04/01/2010)   Serum creatinine: 1.07  (12/31/2009)   Serum potassium 3.8  (12/31/2009)  Self-Management Support :    Diabetes self-management support: Not documented    Hypertension self-management support: Not documented    Lipid self-management support: Not documented     Appended Document: Orders Update     Clinical Lists Changes  Orders: Added new Service order of Est. Patient Level IV (78469) - Signed

## 2010-12-10 NOTE — Progress Notes (Signed)
  Phone Note Other Incoming   Caller: Dr Cay Schillings Summary of Call: sent ot [facility] but left before being evaluated He never saw her before she went home Initial call taken by: Cindee Salt MD,  Mar 25, 2010 4:18 PM  Follow-up for Phone Call        Lyla Son,  Please try to get her in this week Follow-up by: Cindee Salt MD,  Mar 25, 2010 4:19 PM  Additional Follow-up for Phone Call Additional follow up Details #1::        I spoke to pt..  She can't come to our office until she starts medicare.  [employer] is going to have [facility] call her tomorrow.  She is very short of breath,but she said she is going to start some new medications. Additional Follow-up by: Beau Fanny,  Mar 25, 2010 4:24 PM    Additional Follow-up for Phone Call Additional follow up Details #2::    I wish she had stayed in rehab as instructed Follow-up by: Cindee Salt MD,  Mar 25, 2010 5:17 PM

## 2010-12-10 NOTE — Progress Notes (Signed)
  Phone Note Call from Patient Call back at [facility] Phone [phone]   Caller: Patient Call For: Dr.Letvak Summary of Call: I called pt. to set up appt.  She said she can't afford to come.  Her cobra @ work ran out and her medicare doesn't go into effect until September.  She said she checks her sugar every once in awhile and it's usually around 200.  She's still going to her oncologist and she was seen @ [facility] because her heart was racing.  She started a new blood pressure medication today.  She said her medications are very expensive and she can barely afford them.  She does get her insulin through [facility].  She said she'll follow up w/ you when her medicare goes into effect in September. Initial call taken by: Beau Fanny,  January 03, 2010 10:36 AM  Follow-up for Phone Call        go ahead and at least set her up then If she has questions before then, she should ask the oncologist or call here Follow-up by: Cindee Salt MD,  January 03, 2010 1:11 PM  Additional Follow-up for Phone Call Additional follow up Details #1::        Patient notified.  Pt scheduled appt. on 07/16/10 @ 8:00. Additional Follow-up by: Beau Fanny,  January 03, 2010 1:15 PM

## 2010-12-10 NOTE — Miscellaneous (Signed)
  Clinical Lists Changes  Medications: Changed medication from KLOR-CON M10 10 MEQ  TBCR (POTASSIUM CHLORIDE CRYS CR) 2 tablets by mouth once daily to KLOR-CON M10 10 MEQ  TBCR (POTASSIUM CHLORIDE CRYS CR) Take 1 tablet by mouth two times a day Changed medication from FISH OIL  OIL (FISH OIL) take 2 tablets by mouth once daily to FISH OIL  OIL (FISH OIL) take 1 tablet  by mouth once daily Changed medication from GABAPENTIN 300 MG CAPS (GABAPENTIN) 1 in the morning and 3 at bedtime to GABAPENTIN 300 MG CAPS (GABAPENTIN) Take 1 tablet by mouth two times a day Added new medication of FEMARA 2.5 MG TABS (LETROZOLE) Take 1 tablet by mouth once daily Added new medication of FLEXERIL 10 MG TABS (CYCLOBENZAPRINE HCL) Take 1 tablet by mouth every eight hours as needed Observations: Added new observation of PAST SURG HX: Thyroidectomy (09/2004) Transthoracic Echocardiogram (05/16/2004) Partial Hysterectomy 1975 Excision biopsy growth right axilla, benign 1993 Throat growth (benign) 1989 Thyroid biopsy 10/99 Vertigo- MRI/MRA/ECHO/Carotids 07/05 Carpal tunnel release bilat (5-6/09) Left modified radical mastectomy--4/10   Dr Renda Rolls Tonsillectomy Appendectomy  (03/15/2010 15:16) Added new observation of HX OF DVT: yes (03/15/2010 15:16) Added new observation of ASTHMAHXOF: yes (03/15/2010 15:16) Added new observation of PMH IDDM: yes (03/15/2010 15:16) Added new observation of PAST MED HX: Depression Multi-Nodular Goiter Dyslipidemia NIDDM with neuropathy Hypertension DVT, hx of Allergic rhinitis GERD Urinary incontinence Peripheral neuropathy Multiple sclerosis Breast cancer Hypothyroidism Insulin dependent Diabetes  Asthma Right leg Deep Vein Thrombosis in 1990s,  (03/15/2010 15:16)       Allergies: 1)  ! * Penicillin 2)  * Trovan 3)  * Avandia 4)  * Percocet   Past History:  Past Medical History: Depression Multi-Nodular Goiter Dyslipidemia NIDDM with  neuropathy Hypertension DVT, hx of Allergic rhinitis GERD Urinary incontinence Peripheral neuropathy Multiple sclerosis Breast cancer Hypothyroidism Insulin dependent Diabetes  Asthma Right leg Deep Vein Thrombosis in 1990s,  Past Surgical History: Thyroidectomy (09/2004) Transthoracic Echocardiogram (05/16/2004) Partial Hysterectomy 1975 Excision biopsy growth right axilla, benign 1993 Throat growth (benign) 1989 Thyroid biopsy 10/99 Vertigo- MRI/MRA/ECHO/Carotids 07/05 Carpal tunnel release bilat (5-6/09) Left modified radical mastectomy--4/10   Dr Renda Rolls Tonsillectomy Appendectomy

## 2010-12-10 NOTE — Miscellaneous (Signed)
Summary: Care Plan/[facility]  Care Plan/[facility]   Imported By: Lanelle Bal 06/03/2010 10:20:32  _____________________________________________________________________  External Attachment:    Type:   Image     Comment:   External Document

## 2010-12-10 NOTE — Progress Notes (Signed)
Summary: wants written orders   Phone Note Call from Patient Call back at [phone]   Caller: Patient Call For: Cindee Salt MD Summary of Call: Pt is asking for an order for a bed rail.  She is having problems getting out of bed.  She also wants an order for a reacher, she says she cant reach up into cabinets.  She has moved in with her daughter and everything is up high.  Please call when orders are ready. Initial call taken by: Lowella Petties CMA, AAMA,  October 17, 2010 9:41 AM  Follow-up for Phone Call        I have not heard of Medicare or Medicaid covering these items. She should check with a medical supply store and find out if they are and what the criteria are for them to be paid for Generally bed rails are not a good idea---better to have walker or other stable item at bedside to help with movements Follow-up by: Cindee Salt MD,  October 17, 2010 1:23 PM  Additional Follow-up for Phone Call Additional follow up Details #1::        spoke with patient and she checked with [facility] and they will cover these supplies, per pt these are durable medical needs? I advised pt to have them fax [place] something for Dr.Letvak to sign, pt states medicaid will cover and if not medicare will? DeShannon Katrinka Blazing CMA [facility] Dull)  October 17, 2010 3:50 PM   Rx written Additional Follow-up by: Cindee Salt MD,  October 17, 2010 5:51 PM    Additional Follow-up for Phone Call Additional follow up Details #2::    rx mailed to patient and scanned to chart. Follow-up by: Mervin Hack CMA [facility] Dull),  October 18, 2010 9:51 AM

## 2010-12-10 NOTE — Progress Notes (Signed)
Summary: Recertify patient  Phone Note From Other Clinic Call back at [phone] cell   Caller: [facility] Call For: Dr. Alphonsus Sias Summary of Call: Madeline [name] called and stated that patients certification will run out this week, and she wants to know if she can recertify her.  She is a Medicaid patient.  She stated that it is ok for nurse to call back and give a verbal order to authorize this.  Please advise.  She is aware that Dr. Alphonsus Sias will not be back in the office until tomorrow morning.   Initial call taken by: Linde Gillis CMA [facility] Dull),  July 23, 2010 4:23 PM  Follow-up for Phone Call        okay to recertify Please have them send the appropriate plan of care for my review Follow-up by: Cindee Salt MD,  July 23, 2010 8:05 PM  Additional Follow-up for Phone Call Additional follow up Details #1::        spoke with [facility] and advised results, she will send plan of care. Additional Follow-up by: Mervin Hack CMA [facility] Dull),  July 24, 2010 8:07 AM

## 2010-12-10 NOTE — Assessment & Plan Note (Signed)
Summary: NP6/AMD  Medications Added LANTUS 100 UNIT/ML  SOLN (INSULIN GLARGINE) inject  50 units  subcutaneously each evening as directed TRAZODONE HCL 100 MG TABS (TRAZODONE HCL) take one half in the morning and two at bedtime PROTONIX 40 MG TBEC (PANTOPRAZOLE SODIUM) 1 by mouth once daily CARVEDILOL 12.5 MG TABS (CARVEDILOL) Take one tablet by mouth twice a day      Allergies Added:   Visit Type:  New Patient Primary Provider:  Cindee Salt MD  CC:  chest pain, chin pain, and sunday morning she could taste blood in her throat. hypertension is not any better with medicine. Marland Kitchen  History of Present Illness: Madeline [name] is a 59 year old woman, patient of Dr. Alphonsus Sias, with a history of diabetes, long smoking history with suspected COPD, breast cancer with XRT on the left him a status post mastectomy, currently undergoing chemotherapy every 3 weeks until June 2011, history of DVT on the right, chronic shortness of breath and multiple sclerosis who presents for worsening shortness of breath.  Madeline [name] states that on Saturday, 3 days ago, she was walking at [facility]. She typically uses her walker and is able to sit and when she has shortness of breath. She is unable to pack her walker into the car and she only had her cane at [facility]. she walked across the entire length of the hospital and felt very short of breath. She states that it took her a day or 2 to recover she was so exhausted. She is uncertain if her heart was having issues as she also felt some pulling in her left pectoral region when she moved her arm about and when she reached for objects in her kitchen.  Currently she just feels tired overall no chest pain and no other complaints.  Preventive Screening-Counseling & Management  Alcohol-Tobacco     Alcohol drinks/day: 0     Smoking Status: quit     Year Quit: 2011  Caffeine-Diet-Exercise     Caffeine use/day: 1 cup      Does Patient Exercise: little  Current Problems  (verified): 1)  Hypothyroidism  (ICD-244.9) 2)  Back Pain  (ICD-724.5) 3)  Adenocarcinoma, Breast, Left  (ICD-174.9) 4)  Multiple Sclerosis  (ICD-340) 5)  Sleep Disorder  (ICD-780.50) 6)  Peripheral Neuropathy  (ICD-356.9) 7)  Carpal Tunnel Syndrome, Bilateral  (ICD-354.0) 8)  Palpitations  (ICD-785.1) 9)  Diabetes Mellitus, Type II, With Neurological Complications  (ICD-250.60) 10)  Urinary Incontinence  (ICD-788.30) 11)  Gerd  (ICD-530.81) 12)  Goiter  (ICD-240.9) 13)  Hx, Pneumonia  (ICD-V12.61) 14)  Allergic Rhinitis  (ICD-477.9) 15)  Tobacco Abuse  (ICD-305.1) 16)  Dvt, Hx Of, Left With Chronic Anticoag.  (ICD-V12.51) 17)  Hypertension  (ICD-401.9) 18)  Depression  (ICD-311)  Current Medications (verified): 1)  Metformin Hcl 1000 Mg Tabs (Metformin Hcl) .... Take One By Mouth Two Times A Day 2)  Humalog Pen 100 Unit/ml  Soln (Insulin Lispro (Human)) .... Inject 30 Units With Each Meal 3)  Lantus 100 Unit/ml  Soln (Insulin Glargine) .... Inject  50 Units  Subcutaneously Each Evening As Directed 4)  Bd Ultra-Fine Iii Mini Pen Needles 3/16 Inch 31 Guage .... Use Three- Four  Times Daily As Directed 5)  Aspirin 325 Mg  Tbec (Aspirin) .... Take One By Mouth Once A Day 6)  Triamterene-Hctz 37.5-25 Mg  Caps (Triamterene-Hctz) .... Take One By Mouth Once A Day 7)  Furosemide 40 Mg  Tabs (Furosemide) .... Take One and  One Half Daily 8)  Tylenol Pm Extra Strength 500-25 Mg  Tabs (Diphenhydramine-Apap (Sleep)) .... As Needed 9)  Albuterol 90 Mcg/act  Aers (Albuterol) .... As Needed 10)  Allopurinol 100 Mg  Tabs (Allopurinol) .... Take 1 Tablet By Mouth Once A Day 11)  Oxycodone-Acetaminophen 5-325 Mg  Tabs (Oxycodone-Acetaminophen) .Marland Kitchen.. 1-2 Tablests By Mouth Every 4 Hours As Needed For Pain 12)  Klor-Con M10 10 Meq  Tbcr (Potassium Chloride Crys Cr) .... 2 Tablets By Mouth Once Daily 13)  Diazepam 5 Mg  Tabs (Diazepam) .... 1/2 -1 Two Times A Day As Needed For Nerves 14)  Cyclobenzaprine  Hcl 10 Mg  Tabs (Cyclobenzaprine Hcl) .Marland Kitchen.. 1 Three Times A Day As Needed For Muscle Spasm 15)  Meloxicam 15 Mg  Tabs (Meloxicam) .Marland Kitchen.. 1 Daily As Needed For Pain 16)  Copaxone 20 Mg/ml Kit (Glatiramer Acetate) .Marland Kitchen.. 1 Injection [place] Daily, Disp Qs 17)  Trazodone Hcl 100 Mg Tabs (Trazodone Hcl) .... Take One Half in The Morning and Two At Bedtime 18)  Fish Oil  Oil (Fish Oil) .... Take 2 Tablets By Mouth Once Daily 19)  Protonix 40 Mg Tbec (Pantoprazole Sodium) .Marland Kitchen.. 1 By Mouth Once Daily 20)  Levothyroxine Sodium 175 Mcg Tabs (Levothyroxine Sodium) .Marland Kitchen.. 1 Tab Daily 21)  Gabapentin 300 Mg Caps (Gabapentin) .Marland Kitchen.. 1 in The Morning and 3 At Bedtime  Allergies (verified): 1)  ! * Penicillin 2)  * Trovan 3)  * Avandia 4)  * Percocet  Past History:  Past Medical History: Last updated: 06/01/2009 Depression Multi-Nodular Goiter Dyslipidemia NIDDM with neuropathy Hypertension DVT, hx of Allergic rhinitis GERD Urinary incontinence Peripheral neuropathy Multiple sclerosis Breast cancer Hypothyroidism  Past Surgical History: Last updated: 03/22/2009 Thyroidectomy (09/2004) Transthoracic Echocardiogram (05/16/2004) Hysterectomy 1975 Excision biopsy growth right axilla, benign 1993 Throat growth (benign) 1989 Thyroid biopsy 10/99 Vertigo- MRI/MRA/ECHO/Carotids 07/05 Carpal tunnel release bilat (5-6/09) Left modified radical mastectomy--4/10   Dr Renda Rolls  Social History: Last updated: 12/22/2008 Widowed 1999 then 2nd Marriage--2000. Widowed again 2009 Children: 4 Occupation: works in Community education officer office---currently disabled Former Smoker--quit  ~2005 Alcohol use-no  Risk Factors: Alcohol Use: 0 (01/02/2010) Caffeine Use: 1 cup  (01/02/2010) Exercise: little (01/02/2010)  Risk Factors: Smoking Status: quit (01/02/2010)  Social History: Alcohol drinks/day:  0 Caffeine use/day:  1 cup  Does Patient Exercise:  little  Review of Systems       The patient complains of chest  pain and dyspnea on exertion.  The patient denies anorexia, fever, weight loss, weight gain, vision loss, decreased hearing, hoarseness, syncope, peripheral edema, prolonged cough, headaches, hemoptysis, abdominal pain, melena, hematochezia, severe indigestion/heartburn, hematuria, incontinence, genital sores, muscle weakness, suspicious skin lesions, transient blindness, difficulty walking, depression, unusual weight change, abnormal bleeding, enlarged lymph nodes, angioedema, breast masses, and testicular masses.     Vital Signs:  Patient profile:   60 year old female Height:      67 inches Weight:      292.50 pounds BMI:     45.98 Pulse rate:   99 / minute Pulse rhythm:   regular BP sitting:   157 / 96  (right arm) Cuff size:   large  Vitals Entered By: Mercer Pod (January 02, 2010 11:26 AM)  Physical Exam  General:  obese woman in no apparent distress, alert and oriented x3, HEENT exam is benign, neck is supple with no JVP or carotid bruits, heart sounds are regular with normal S1 and S2 with no murmurs appreciated, lungs are moderately diminished  throughout both clear, abdominal exam is benign, no significant lower extremity edema, pulses are equal and symmetrical in her upper and lower extremities, neurologic exam is grossly nonfocal, skin is dry.   EKG  Procedure date:  01/02/2010  Findings:      normal sinus rhythm with rate of 97 beats per minute, poor R-wave progression through the precordial leads concerning for anteroseptal infarct, QTC of 429.  Impression & Recommendations:  Problem # 1:  DYSPNEA (ICD-786.05) etiology of the shortness of breath I suspect is multifactorial. I believe she is very deconditioned and was very short of breath walking across the hospital from one side to the other several days ago. I suspect she has underlying COPD given her 50 year smoking history. Her heart rate is elevated at baseline per the patient as well as on her visit today with  baseline heart rate at rest of 97 beats per minute. With exertion this would likely be very elevated. Her blood pressure is also elevated and she states it has been hard to control. This would also likely contributed to some shortness of breath. She is very concerned about any medications and the insurance cost is currently she does not have good coverage and has not wanting to do any significant testing until her coverage starts later in the year. Ideally we would do an echocardiogram and possibly a stress test that she would like to defer on this.  We will start her on a low-dose beta blocker, Coreg 6.25 mg b.i.d. titrating up to 12.5 b.i.d. after several days. I hope this will help with her elevated heart rate and her blood pressure. I've asked her to contact us if her shortness of breath gets worse as this may exacerbate her COPD. She currently does not take her albuterol very regularly.  Ideally, she may benefit from the oncology rehabilitation after her treatments have been completed this June 2011.  Her updated medication list for this problem includes:    Aspirin 325 Mg Tbec (Aspirin) .Marland Kitchen... Take one by mouth once a day    Triamterene-hctz 37.5-25 Mg Caps (Triamterene-hctz) .Marland Kitchen... Take one by mouth once a day    Furosemide 40 Mg Tabs (Furosemide) .Marland Kitchen... Take one and one half daily    Carvedilol 12.5 Mg Tabs (Carvedilol) .Marland Kitchen... Take one tablet by mouth twice a day  Problem # 2:  CHEST PAIN-UNSPECIFIED (ICD-786.50) the chest pain that she described it sounds very musculoskeletal. Given her surgery on the left side following her mastectomy, I suggest to monitor her symptoms at this time. Again she has deferred on any cardiac imaging at this time until her insurance kicks in in September 2011. Her updated medication list for this problem includes:    Aspirin 325 Mg Tbec (Aspirin) .Marland Kitchen... Take one by mouth once a day    Carvedilol 12.5 Mg Tabs (Carvedilol) .Marland Kitchen... Take one tablet by mouth twice a  day  Problem # 3:  DIAB W/O COMP TYPE II/UNS NOT STATED UNCNTRL (ICD-250.00) history of diabetes. Dr. Alphonsus Sias has known the patient for many years and is managing her diabetes and many of her other medical issues. Her updated medication list for this problem includes:    Metformin Hcl 1000 Mg Tabs (Metformin hcl) .Marland Kitchen... Take one by mouth two times a day    Humalog Pen 100 Unit/ml Soln (Insulin lispro (human)) ..... Inject 30 units with each meal    Lantus 100 Unit/ml Soln (Insulin glargine) ..... Inject  50 units  subcutaneously each evening as  directed    Aspirin 325 Mg Tbec (Aspirin) .Marland Kitchen... Take one by mouth once a day  Patient Instructions: 1)  Your physician recommends that you schedule a follow-up appointment in: 1 month 2)  Your physician has recommended you make the following change in your medication: start coreg 6.25 mg twice a day for 3 days then increase dose to 12.5 mg twice a day. Prescriptions: CARVEDILOL 12.5 MG TABS (CARVEDILOL) Take one tablet by mouth twice a day  #60 x 6   Entered by:   Charlena Cross, RN, BSN   Authorized by:   Dossie Arbour MD   Signed by:   Charlena Cross, RN, BSN on 01/02/2010   Method used:   Printed then faxed to ...       [address]  [place]. [phone]* (retail)       [address]       [place], [place]  [address]       Ph: [phone]       Fax: [phone]   RxID:   [id number] CARVEDILOL 12.5 MG TABS (CARVEDILOL) Take one tablet by mouth twice a day  #60 x 6   Entered by:   Charlena Cross, RN, BSN   Authorized by:   Dossie Arbour MD   Signed by:   Charlena Cross, RN, BSN on 01/02/2010   Method used:   Electronically to        [facility]. [phone]* (retail)       [address]       [place], [place]  [address]       Ph: [phone]       Fax: [phone]   RxID:   [id number]

## 2010-12-10 NOTE — Progress Notes (Signed)
Summary: Feet and lower legs swollen  Phone Note Call from Patient Call back at Home Phone [phone]   Caller: Patient Call For: Cindee Salt MD Summary of Call: Patient says that her feet and lower legs have been swollen since Friday.  She has kept them elevated and tried to use the TEDS hose but they are so swollen, she can't get the hose on.  She has tried taking an extra fluid pill and she urinates a lot but her swelling is not going down.  She says they are beginning to hurt also, even with her pain medication.  She has no insurance and no way of getting here for an appointment.  What can she do? Initial call taken by: Delilah Shan CMA Duncan Dull),  March 04, 2010 2:46 PM  Follow-up for Phone Call        probably could increase her fluid pill temporarily I will forward this note to Dr Mariah Milling who saw her last Follow-up by: Cindee Salt MD,  March 04, 2010 3:42 PM  Additional Follow-up for Phone Call Additional follow up Details #1::        spoke with patient and she will increase her fluid pill. Patient also states she will not go back to [facility] in [place], pt states she was embarassed by the front desk for not having her co-pay and also she doesn't want to see any other Dr. Jackelyn Knife Dr.Gollan, she has no insurance and her medicare won't go in effect until Sept. also Dr. Mariah Milling is out on maternity leave with his wife. Please advise. DeShannon Smith CMA Duncan Dull)  March 04, 2010 4:03 PM   Has she tried applying for the [facility] reduced payments plan. It might decrease her bill almost completely, depending on her income. Please have Jamesetta So or Aram Beecham get her information. If necessary, I could try to get out to do a home visit or she can come in here if the finances are taken care of Cindee Salt MD  March 04, 2010 4:10 PM   spoke with patient and she asked that I mail something to her about this program. letter sent containing results.  Additional Follow-up by:  Mervin Hack CMA Duncan Dull),  March 04, 2010 4:31 PM     Appended Document: [facility]    Phone Note Outgoing Call   Call placed by: Cindee Salt MD,  Mar 18, 2010 5:49 PM Summary of Call: had gone home last week after initial admission but developed SOB again On oxygen and just got to room 117 looking for blood clots, etc will need oxygen at home will follow along social worker said I was approved through [facility] to follow her Initial call taken by: Cindee Salt MD,  Mar 18, 2010 5:50 PM  Follow-up for Phone Call        No answer in room spoke to nurse dyspnea is improving will hope to schedule visit here when discharged (working out the Medicaid issue) Follow-up by: Cindee Salt MD,  Mar 21, 2010 1:42 PM  Additional Follow-up for Phone Call Additional follow up Details #1::        Feeling some better will be going to [facility] next Monday  Asked her to call office when she goes home so we can set up follow up Additional Follow-up by: Cindee Salt MD,  Mar 22, 2010 2:00 PM

## 2010-12-10 NOTE — Assessment & Plan Note (Signed)
Summary: DISCUSS DEPRESSION   Vital Signs:  Patient profile:   60 year old female Weight:      295 pounds Temp:     98.2 degrees F oral Pulse rate:   88 / minute Pulse rhythm:   regular BP sitting:   136 / 76  (right arm) Cuff size:   large  Vitals Entered By: Janee Morn CMA (June 07, 2010 12:58 PM) CC: discuss depression   History of Present Illness: "I don't know what is the matter with me" Very depressed  Had been seeing someone from her church since last September had been engaged He just went and left with another woman and married her She had been exclusive with him and she is very shocked and upset Has done some crying daughter came down from  to help her--she just left  having right leg pain favoring left now [facility] better with walker recent neuro eval may go for pain evaluation  No sig suicidal thoughts keeps up with church  Appetite is off some no weight loss though Not sleeping well---only 4 hours per night even with trazodone and diazepam  Had felt okay with mood until this breakup  had not have sex so no worries about STD  Allergies: 1)  ! * Penicillin 2)  * Trovan 3)  * Avandia 4)  * Percocet  Past History:  Past medical, surgical, family and social histories (including risk factors) reviewed for relevance to current acute and chronic problems.  Past Medical History: Reviewed history from 04/01/2010 and no changes required. Depression Multi-Nodular Goiter Dyslipidemia NIDDM with neuropathy Hypertension Allergic rhinitis GERD Urinary incontinence Peripheral neuropathy Multiple sclerosis Breast cancer Hypothyroidism Asthma Right leg Deep Vein Thrombosis in 1990s, COPD  Past Surgical History: Reviewed history from 03/15/2010 and no changes required. Thyroidectomy (09/2004) Transthoracic Echocardiogram (05/16/2004) Partial Hysterectomy 1975 Excision biopsy growth right axilla, benign 1993 Throat growth (benign)  1989 Thyroid biopsy 10/99 Vertigo- MRI/MRA/ECHO/Carotids 07/05 Carpal tunnel release bilat (5-6/09) Left modified radical mastectomy--4/10   Dr Renda Rolls Tonsillectomy Appendectomy  Family History: Reviewed history and no changes required.  Social History: Reviewed history from 12/22/2008 and no changes required. Widowed 1999 then 2nd Marriage--2000. 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  NAD Psych:  normally interactive, good eye contact, dysphoric affect, and tearful.     Impression & Recommendations:  Problem # 1:  OTHER ACUTE REACTIONS TO STRESS (ICD-308.3) Assessment New has been jilted when she expected marriage worse yet, he married someon else No worrisome features discussed that this is expected response no need for meds now May benefit from counselling but insurance issues for now----her Medicare will start in September  counselled all of 25 minute visit  Complete Medication List: 1)  Copaxone 20 Mg/ml Kit (Glatiramer acetate) .Marland Kitchen.. 1 injections once daily 2)  Metformin Hcl 1000 Mg Tabs (Metformin hcl) .... Take one by mouth two times a day 3)  Humalog Pen 100 Unit/ml Soln (Insulin lispro (human)) .... Inject 30 units with each meal 4)  Lantus 100 Unit/ml Soln (Insulin glargine) .... Inject  50 units  subcutaneously each evening as directed 5)  Furosemide 40 Mg Tabs (Furosemide) .... Take one and one half daily 6)  Albuterol 90 Mcg/act Aers (Albuterol) .Marland KitchenMarland KitchenMarland Kitchen  As needed 7)  Allopurinol 100 Mg Tabs (Allopurinol) .... Take 1 tablet by mouth once a day 8)  Oxycodone-acetaminophen 5-325 Mg Tabs (Oxycodone-acetaminophen) .Marland Kitchen.. 1-2 tablests by mouth every 4 hours as needed for pain 9)  Klor-con M10 10 Meq  Tbcr (Potassium chloride crys cr) .... Take 1 tablet by mouth two times a day 10)  Diazepam 5 Mg Tabs (Diazepam) .... 1/2 -1 two times a day as needed for nerves 11)  Cyclobenzaprine Hcl 10 Mg Tabs (Cyclobenzaprine hcl) .Marland Kitchen.. 1 three times a day as needed for muscle spasm 12)  Meloxicam 15 Mg Tabs (Meloxicam) .Marland Kitchen.. 1 daily as needed for pain 13)  Trazodone Hcl 100 Mg Tabs (Trazodone hcl) .... Take one half in the morning and two at bedtime 14)  Levothyroxine Sodium 175 Mcg Tabs (Levothyroxine sodium) .Marland Kitchen.. 1 tab daily 15)  Gabapentin 300 Mg Caps (Gabapentin) .... Take 1 tablet in am and 3 tablets at bedtime 16)  Carvedilol 25 Mg Tabs (Carvedilol) .... Take one tablet by mouth twice a day 17)  Femara 2.5 Mg Tabs (Letrozole) .... Take 1 tablet by mouth once daily 18)  Flexeril 10 Mg Tabs (Cyclobenzaprine hcl) .... Take 1 tablet by mouth every eight hours as needed 19)  Nicotine 14 Mg/24hr Pt24 (Nicotine) .... Use as directed 20)  Provigil 100 Mg Tabs (Modafinil) .... Take one tablet by mouth daily 21)  Symbicort 160-4.5 Mcg/act Aero (Budesonide-formoterol fumarate) .... Take one puff two times a day 22)  Omeprazole 20 Mg Cpdr (Omeprazole) .... Take 1 by mouth once daily 23)  Aspirin 325 Mg Tbec (Aspirin) .... Take one by mouth once a day 24)  Tylenol Pm Extra Strength 500-25 Mg Tabs (Diphenhydramine-apap (sleep)) .... As needed 25)  Fish Oil Oil (Fish oil) .... Take 1 tablet  by mouth once daily 26)  Calcium-vitamin D 600-200 Mg-unit Tabs (Calcium-vitamin d) .... Take one tablet by mouth daily 27)  Bd Pen Needle Ultrafine 29g X 12.28mm Misc (Insulin pen needle) .... Patient test 3-4 times daily  Patient Instructions: 1)  Please schedule a follow-up appointment in 3-4  weeks.   Current Allergies (reviewed today): ! * PENICILLIN * TROVAN * AVANDIA * PERCOCET

## 2010-12-10 NOTE — Progress Notes (Signed)
Summary: Nicotine Patch, new acid reflux med, Symbicort  Phone Note Call from Patient   Caller: Daughter, Mamie Laurel [phone] Call For: Cindee Salt MD Summary of Call: The daughter says she has authority (HIPPA) concerning her mother's medical information.    1.  She is now out of her acid reflux medication.  She says she thinks the Protonix will require a prior authorization but that you mentioned at       her last OV that you might  prescribe something else.   2.  You gave her samples of Symbicort to last a few months but she will also need a prior authorization on that medication. 3.  Needs a new Rx. for the Nicotine Patch 14 mg.    [facility] Initial call taken by: Delilah Shan CMA [facility] Dull),  Apr 09, 2010 9:10 AM  Follow-up for Phone Call        can try omeprazole instead for her acid reflux Rx is generic--can send x 1 year  May need the pulmonolgist to do prior authorization for symbicort since I am not sure why they decidied on this medicine  nicotine patches are OTC. If her insurance will cover, okay to send Rx  Follow-up by: Cindee Salt MD,  Apr 09, 2010 9:21 AM  Additional Follow-up for Phone Call Additional follow up Details #1::        Rx Called In, Spoke with patient and advised results.  Additional Follow-up by: Mervin Hack CMA [facility] Dull),  Apr 09, 2010 4:49 PM    New/Updated Medications: OMEPRAZOLE 20 MG CPDR (OMEPRAZOLE) take 1 by mouth once daily Prescriptions: NICOTINE 14 MG/24HR PT24 (NICOTINE) use as directed  #1 month x 2   Entered by:   Mervin Hack CMA (AAMA)   Authorized by:   Cindee Salt MD   Signed by:   Mervin Hack CMA (AAMA) on 04/09/2010   Method used:   Electronically to        [facility] (retail)       [address]       [place], [place]  [address]       Ph: [phone]       Fax: [phone]   RxID:   [id number] OMEPRAZOLE 20 MG CPDR (OMEPRAZOLE) take 1  by mouth once daily  #30 x 12   Entered by:   Mervin Hack CMA (AAMA)   Authorized by:   Cindee Salt MD   Signed by:   Mervin Hack CMA (AAMA) on 04/09/2010   Method used:   Electronically to        [facility] (ret[facility])       [address]       [place], [place]  [address]       Ph: [phone]       Fax: [phone]   RxID:   [id number]

## 2010-12-10 NOTE — Progress Notes (Signed)
Summary: regarding blood sugar  Phone Note Call from Patient   Caller: Patient Call For: Madeline Salt MD Summary of Call: Pt wants you to know that she had an episode when her blood sugar dropped to 41 this past tuesday.  It has been ok since, actually a little high, which she says she prefers over low.  She says she still feels somewhat weak, but is improving.  Also, she wants a refill on her diazepam [facility] [place]. Initial call taken by: Lowella Petties CMA, AAMA,  September 27, 2010 11:23 AM  Follow-up for Phone Call        yes, I prefer the sugar a little high to low sugar reactions which can be dangerous if she isn't able to respond to them She should call if she has any recurrences and make sure she eats regularly  Okay diazepam #60 x 1 Follow-up by: Madeline Salt MD,  September 27, 2010 1:37 PM  Additional Follow-up for Phone Call Additional follow up Details #1::        Spoke with patient and advised results, and that rx has been called in. Additional Follow-up by: Mervin Hack CMA [facility] Dull),  September 27, 2010 2:17 PM    Prescriptions: DIAZEPAM 5 MG  TABS (DIAZEPAM) 1/2 -1 two times a day as needed for nerves  #60 x 1   Entered by:   Mervin Hack CMA (AAMA)   Authorized by:   Madeline Salt MD   Signed by:   Mervin Hack CMA (AAMA) on 09/27/2010   Method used:   Telephoned to ...       [facility] (retail)       [address]       [place], [place]  [address]       Ph: [phone]       Fax: [phone]   RxID:   [id number]

## 2010-12-10 NOTE — Medication Information (Signed)
Summary: Medication Assistance Program Forms/[facility]  Medication Assistance Program Forms/[facility]   Imported By: Lanelle Bal 01/02/2010 13:18:34  _____________________________________________________________________  External Attachment:    Type:   Image     Comment:   External Document

## 2010-12-11 ENCOUNTER — Ambulatory Visit: Payer: Self-pay | Admitting: Oncology

## 2010-12-12 NOTE — Miscellaneous (Signed)
Summary: UA Order/[facility]  UA Order/[facility]   Imported By: Lanelle Bal 11/20/2010 12:30:53  _____________________________________________________________________  External Attachment:    Type:   Image     Comment:   External Document

## 2010-12-12 NOTE — Miscellaneous (Signed)
Summary: Physician's Orders/[facility]  Physician's Orders/[facility]   Imported By: Maryln Gottron 10/21/2010 14:32:56  _____________________________________________________________________  External Attachment:    Type:   Image     Comment:   External Document

## 2010-12-12 NOTE — Progress Notes (Signed)
Summary: pt wants to increase neurontin  Phone Note From Other Clinic   Caller: Victorino Dike at [facility] pain clinic [phone] Summary of Call: Pt is asking to increae neurontin dose to seven of the 300 mg's per day, she is currently taking 6. Initial call taken by: Lowella Petties CMA, AAMA,  December 02, 2010 11:05 AM  Follow-up for Phone Call        that is okay They can titrate the gabapentin and then just let us know  Follow-up by: Cindee Salt MD,  December 02, 2010 11:18 AM  Additional Follow-up for Phone Call Additional follow up Details #1::        Spoke with Victorino Dike and advised results.  Additional Follow-up by: Mervin Hack CMA [facility] Dull),  December 02, 2010 2:49 PM

## 2010-12-12 NOTE — Progress Notes (Signed)
Summary: regarding pain meds  Phone Note From Other Clinic   Caller: Deena at Dr. Letta Moynahan office [phone] Summary of Call: Dr. Russella Dar office is asking if you want them to take over all of pt's pain meds.  Please advise.              Lowella Petties CMA, AAMA  November 25, 2010 4:16 PM   Follow-up for Phone Call        yes, that would be great and allow for no misunderstandings I will leave all the pain meds to him Follow-up by: Cindee Salt MD,  November 25, 2010 5:34 PM  Additional Follow-up for Phone Call Additional follow up Details #1::        Advised Deena. Additional Follow-up by: Lowella Petties CMA, AAMA,  November 26, 2010 9:08 AM

## 2010-12-12 NOTE — Progress Notes (Signed)
Summary: verbal ok given for pill box fills  Phone Note Call from Patient   Caller: Nurse with [facility] Call For: Cindee Salt MD Summary of Call: Home health nurse needs verbal order to recertify the patient for pill box fills.  Verbal ok given.     Lowella Petties CMA, AAMA  November 19, 2010 9:02 AM   Follow-up for Phone Call        okay Follow-up by: Cindee Salt MD,  November 19, 2010 9:09 AM

## 2010-12-12 NOTE — Progress Notes (Signed)
Summary: regarding gabapentin dose  Phone Note Call from Patient   Caller: Patient Summary of Call: Pt states her home health nurse is at her house for a visit.  Nurse is asking for the current dose of gabapentin that the pt is taking. I advised six capsules a day.  Nurse is Jonetta Osgood with [facility]. Initial call taken by: Lowella Petties CMA, AAMA,  November 27, 2010 2:50 PM  Follow-up for Phone Call        that is  correct we increased her stepwise at Dr Crisp's request Follow-up by: Cindee Salt MD,  November 27, 2010 5:55 PM

## 2010-12-12 NOTE — Letter (Signed)
Summary: Dr. Metta Clines to Take Over Pain Meds/[facility]  Dr. Metta Clines to Take Over Pain Meds/[facility]   Imported By: Lanelle Bal 11/27/2010 15:47:31  _____________________________________________________________________  External Attachment:    Type:   Image     Comment:   External Document

## 2010-12-12 NOTE — Letter (Signed)
Summary: CMN for Bed/[facility]  CMN for Bed/[facility]   Imported By: Lanelle Bal 11/27/2010 15:50:42  _____________________________________________________________________  External Attachment:    Type:   Image     Comment:   External Document

## 2010-12-12 NOTE — Progress Notes (Signed)
Summary: Increase in meds  Phone Note Outgoing Call Call back at [facility] Phone [phone]   Call placed by: DeShannon Katrinka Blazing CMA Duncan Dull),  November 19, 2010 3:26 PM Call placed to: Patient Summary of Call: received fax stating that Dr.Crisp at [facility] suggest that pt call Dr.Dametra Whetsel for increase of neurontin to 5 once daily for 3 days then 6 once daily. Initial call taken by: Mervin Hack CMA [facility] Dull),  November 19, 2010 3:27 PM  Follow-up for Phone Call        per written order from Dr.Cymone Yeske ok to increase neurontin. left message on machine at home for patient to return my call.  DeShannon Smith CMA [facility] Dull)  November 19, 2010 3:27 PM   Patient called back and I advised results.  Follow-up by: Mervin Hack CMA (AAMA),  November 20, 2010 8:25 AM    New/Updated Medications: GABAPENTIN 300 MG CAPS (GABAPENTIN) Take 6 by mouth daily

## 2010-12-12 NOTE — Letter (Signed)
Summary: CMN for Hospital Bed/[facility]  CMN for Hospital Bed/[facility]   Imported By: Lanelle Bal 11/25/2010 12:50:27  _____________________________________________________________________  External Attachment:    Type:   Image     Comment:   External Document

## 2010-12-12 NOTE — Miscellaneous (Signed)
Summary: Order for Hospital Bed/[facility]  Order for Hospital Bed/[facility]   Imported By: Lanelle Bal 11/25/2010 12:48:14  _____________________________________________________________________  External Attachment:    Type:   Image     Comment:   External Document

## 2010-12-12 NOTE — Progress Notes (Signed)
Summary: refill request for oxycodone  Phone Note Refill Request Call back at [phone] Message from:  Patient  Refills Requested: Medication #1:  OXYCODONE-ACETAMINOPHEN 5-325 MG  TABS 1-2 tablests by mouth every 4 hours as needed for pain Please call pt when ready.  Also, she did start back on her lantus and so far she is doing ok.  Initial call taken by: Lowella Petties CMA, AAMA,  October 29, 2010 4:05 PM  Follow-up for Phone Call        Rx written Follow-up by: Cindee Salt MD,  October 30, 2010 7:57 AM  Additional Follow-up for Phone Call Additional follow up Details #1::        Spoke with patient and advised rx ready for pick-up  Additional Follow-up by: Mervin Hack CMA [facility] Dull),  October 30, 2010 9:17 AM    Prescriptions: OXYCODONE-ACETAMINOPHEN 5-325 MG  TABS (OXYCODONE-ACETAMINOPHEN) 1-2 tablests by mouth every 4 hours as needed for pain  #90 x 0   Entered and Authorized by:   Cindee Salt MD   Signed by:   Cindee Salt MD on 10/30/2010   Method used:   Print then Give to Patient   RxID:   [id number]

## 2010-12-12 NOTE — Letter (Signed)
Summary: [facility]  [facility]   Imported By: Lanelle Bal 11/25/2010 07:35:45  _____________________________________________________________________  External Attachment:    Type:   Image     Comment:   External Document  Appended Document: [facility] wants to increase the gabapentin and is planning injection at next visit

## 2010-12-12 NOTE — Medication Information (Signed)
Summary: Neurontin & Gabapentin Order/[facility]  Neurontin & Gabapentin Order/[facility]   Imported By: Lanelle Bal 11/25/2010 12:47:33  _____________________________________________________________________  External Attachment:    Type:   Image     Comment:   External Document

## 2010-12-12 NOTE — Miscellaneous (Signed)
Summary: Plan/[facility]  Plan/[facility]   Imported By: Lester Davie 12/03/2010 09:59:05  _____________________________________________________________________  External Attachment:    Type:   Image     Comment:   External Document

## 2010-12-12 NOTE — Letter (Signed)
Summary: [facility]  [facility]   Imported By: Maryln Gottron 10/21/2010 11:24:09  _____________________________________________________________________  External Attachment:    Type:   Image     Comment:   External Document  Appended Document: [facility] Evaluation Left cluneal and sciatic nerve blocks done

## 2010-12-12 NOTE — Progress Notes (Signed)
Summary: blood sugar going down  Phone Note Call from Patient Call back at [phone]   Caller: Patient Call For: Cindee Salt MD Summary of Call: Pt states she has had some episodes of low blood sugar.  She says it goes low about once a week and she doesnt know why, is concerned about it.  She says it went down to 73 last night so she drank some orange juice and it went to 471.  She then took 28 units of novolog and it went back down to 103.  I advised her that you are not in today but I would send the note.  She knows to call EMS if any problems this week end. I will send the note to Dr. Dayton Martes as well for review. Initial call taken by: Lowella Petties CMA, AAMA,  October 25, 2010 10:44 AM  Follow-up for Phone Call        I prefer for her CBGs to be a little high rather than low.  Keep eating regularly.  If she feels like CBGs are too low, needs to be evaluated. Ruthe Mannan MD  October 25, 2010 11:29 AM  Advised pt.        Lowella Petties CMA, AAMA  October 25, 2010 11:49 AM   Please check in on her again The low sugar reactions are most likely related to her dose of novolog. See if there is a particular time of day they are occurring. Also, confirm she can feel symptoms, as opposed to just noting a low sugar reading We can lower her novolog if they continue to give her low sugar symptoms Cindee Salt MD  October 26, 2010 9:07 AM   spoke with patient and she states the low reactions happen in the afternoon, also she can feel the symptoms, pt thinks the novolog is stonger than the humalog, she is currently taking 30units, if BS is over 250 she increase by 5 units, if over 300 increase by 10 units, also should she continue to take the lantus the same? DeShannon Katrinka Blazing CMA [facility] Dull)  October 28, 2010 12:55 PM   No change in lantus Try decreasing the novolog to 25 units before lunch since she seem to be having afternoon problems Make the same adjustments if it is elevated call if  the low sugar reactions persist Cindee Salt MD  October 28, 2010 1:09 PM   Spoke with patient and advised results. Patient understood Follow-up by: Mervin Hack CMA Duncan Dull),  October 28, 2010 4:28 PM

## 2010-12-12 NOTE — Progress Notes (Signed)
Summary: Need order  Phone Note Other Incoming   Caller: [facility] [phone] Summary of Call: received fax from [facility] that pt is complaining burning upon urination, foul odor and dark colored urine, they want a order for skilled nursing to collect urine sample for [facility], ok to order? Initial call taken by: Mervin Hack CMA Duncan Dull),  November 14, 2010 8:21 AM  Follow-up for Phone Call        Dr. Dayton Martes could you please sign? order on your desk  DeShannon Katrinka Blazing CMA [facility])  November 14, 2010 10:10 AM   order signed by Dr.Dulcey Riederer, faxed and scanned. Follow-up by: Mervin Hack CMA (AAMA),  November 14, 2010 10:12 AM

## 2010-12-13 NOTE — Miscellaneous (Signed)
Summary: Certification and Plan of Care/[facility]  Certification and Plan of Care/[facility]   Imported By: Maryln Gottron 10/07/2010 12:33:46  _____________________________________________________________________  External Attachment:    Type:   Image     Comment:   External Document

## 2010-12-13 NOTE — Letter (Signed)
Summary: [facility]   Imported By: Lanelle Bal 03/19/2010 10:25:49  _____________________________________________________________________  External Attachment:    Type:   Image     Comment:   External Document

## 2010-12-16 ENCOUNTER — Telehealth: Payer: Self-pay | Admitting: Internal Medicine

## 2010-12-18 NOTE — Op Note (Signed)
Summary: Lumbar Block/[facility]  Lumbar Block/[facility]   Imported By: Sherian Rein 12/10/2010 12:51:47  _____________________________________________________________________  External Attachment:    Type:   Image     Comment:   External Document  Appended Document: Lumbar Block/[facility] multiple nerve blocks gabapentin increased

## 2010-12-26 ENCOUNTER — Ambulatory Visit: Payer: Self-pay | Admitting: Pain Medicine

## 2010-12-26 ENCOUNTER — Encounter: Payer: Self-pay | Admitting: Internal Medicine

## 2010-12-26 NOTE — Progress Notes (Signed)
Summary: gabapentin  Phone Note Refill Request Message from:  Fax from Pharmacy on December 16, 2010 10:06 AM  Refills Requested: Medication #1:  GABAPENTIN 300 MG CAPS Take 6 by mouth daily   Last Refilled: 11/27/2010 [facility] [address] [phone]. Fax is on your desk.   Initial call taken by: Melody Comas,  December 16, 2010 10:07 AM  Follow-up for Phone Call        I think Dr Metta Clines was increasing her to 8 or even 9 per day  Please check with her, then okay to send Rx for 6 months Follow-up by: Cindee Salt MD,  December 16, 2010 1:54 PM  Additional Follow-up for Phone Call Additional follow up Details #1::        spoke with patient and she states she's taking 6 per day. Rx sent to pharmacy. Additional Follow-up by: Mervin Hack CMA [facility] Dull),  December 16, 2010 2:59 PM    Prescriptions: GABAPENTIN 300 MG CAPS (GABAPENTIN) Take 6 by mouth daily  #180 x 6   Entered by:   Mervin Hack CMA (AAMA)   Authorized by:   Cindee Salt MD   Signed by:   Mervin Hack CMA (AAMA) on 12/16/2010   Method used:   Electronically to        [facility]ail[facility]       [address]       [place], [place]  [address]       Ph: [phone]       Fax: [phone]   RxID:   [id number]

## 2010-12-27 ENCOUNTER — Ambulatory Visit: Payer: Self-pay | Admitting: Internal Medicine

## 2010-12-27 ENCOUNTER — Encounter: Payer: Self-pay | Admitting: Internal Medicine

## 2011-01-01 ENCOUNTER — Other Ambulatory Visit: Payer: Self-pay | Admitting: Internal Medicine

## 2011-01-01 ENCOUNTER — Ambulatory Visit (INDEPENDENT_AMBULATORY_CARE_PROVIDER_SITE_OTHER): Payer: Self-pay | Admitting: Internal Medicine

## 2011-01-01 ENCOUNTER — Encounter: Payer: Self-pay | Admitting: Internal Medicine

## 2011-01-01 DIAGNOSIS — F329 Major depressive disorder, single episode, unspecified: Secondary | ICD-10-CM

## 2011-01-01 DIAGNOSIS — E1149 Type 2 diabetes mellitus with other diabetic neurological complication: Secondary | ICD-10-CM

## 2011-01-01 DIAGNOSIS — G35 Multiple sclerosis: Secondary | ICD-10-CM

## 2011-01-01 DIAGNOSIS — I10 Essential (primary) hypertension: Secondary | ICD-10-CM

## 2011-01-01 DIAGNOSIS — E039 Hypothyroidism, unspecified: Secondary | ICD-10-CM

## 2011-01-01 DIAGNOSIS — E785 Hyperlipidemia, unspecified: Secondary | ICD-10-CM

## 2011-01-01 LAB — RENAL FUNCTION PANEL
BUN: 17 mg/dL (ref 6–23)
Calcium: 9 mg/dL (ref 8.4–10.5)
Chloride: 105 mEq/L (ref 96–112)
Creatinine, Ser: 0.8 mg/dL (ref 0.4–1.2)
GFR: 78.36 mL/min (ref >60.00)
Glucose, Bld: 176 mg/dL — ABNORMAL HIGH (ref 70–99)
Sodium: 144 mEq/L (ref 135–145)

## 2011-01-01 LAB — CBC WITH DIFFERENTIAL/PLATELET
Basophils Relative: 0.3 % (ref 0.0–3.0)
Eosinophils Absolute: 0.3 10*3/uL (ref 0.0–0.7)
MCHC: 33.6 g/dL (ref 30.0–36.0)
MCV: 92.1 fl (ref 78.0–100.0)
RBC: 4.17 Mil/uL (ref 3.87–5.11)

## 2011-01-01 LAB — LIPID PANEL
Cholesterol: 193 mg/dL (ref 0–200)
HDL: 36.2 mg/dL — ABNORMAL LOW (ref >39.00)
Total CHOL/HDL Ratio: 5

## 2011-01-01 LAB — HEMOGLOBIN A1C: Hgb A1c MFr Bld: 8.2 % — ABNORMAL HIGH (ref 4.6–6.5)

## 2011-01-06 ENCOUNTER — Ambulatory Visit: Payer: Self-pay | Admitting: Pain Medicine

## 2011-01-06 ENCOUNTER — Encounter: Payer: Self-pay | Admitting: Internal Medicine

## 2011-01-07 NOTE — Assessment & Plan Note (Signed)
Summary: 4 m f/u dlo   Vital Signs:  Patient profile:   60 year old female Weight:      296 pounds BMI:     46.53 Temp:     98.9 degrees F oral Pulse rate:   87 / minute Pulse rhythm:   regular BP sitting:   99 / 66  (left arm) Cuff size:   large  Vitals Entered By: Mervin Hack CMA Duncan Dull) (January 01, 2011 12:53 PM) CC: follow-up   History of Present Illness: Had been doing well till trip to [place] this weekend Got Zenaida Niece back closed on her back has had more pain since then settling down some Pain had been better with the increased gabapentin and shots though  Hasn't been able to afford going back to cardiologist No chest pain Does have DOE---has been stable  Sugars have been okay Fasting 150-250. Moslty under 200 No recent hypoglycemic reactions did have eye exam --- abnormal nerve on right (probably MS)  MS has been stable still on copaxone  Mood has been okay No ongoing depression  Allergies: 1)  ! * Penicillin 2)  * Trovan 3)  * Avandia 4)  * Percocet  Past History:  Past medical, surgical, family and social histories (including risk factors) reviewed for relevance to current acute and chronic problems.  Past Medical History: Reviewed history from 04/01/2010 and no changes required. Depression Multi-Nodular Goiter Dyslipidemia NIDDM with neuropathy Hypertension Allergic rhinitis GERD Urinary incontinence Peripheral neuropathy Multiple sclerosis Breast cancer Hypothyroidism Asthma Right leg Deep Vein Thrombosis in 1990s, COPD  Past Surgical History: Reviewed history from 03/15/2010 and no changes required. Thyroidectomy (09/2004) Transthoracic Echocardiogram (05/16/2004) Partial Hysterectomy 1975 Excision biopsy growth right axilla, benign 1993 Throat growth (benign) 1989 Thyroid biopsy 10/99 Vertigo- MRI/MRA/ECHO/Carotids 07/05 Carpal tunnel release bilat (5-6/09) Left modified radical mastectomy--4/10   Dr Renda Rolls Tonsillectomy Appendectomy  Family History: Reviewed history and no changes required.  Social History: Reviewed history from 12/22/2008 and no changes required. Widowed 1999 then 2nd Marriage--2000. Widowed again 2009 Children: 4 Occupation: works in Community education officer office---currently disabled Former Smoker--quit  ~2005 Alcohol use-no  Review of Systems       sleeps okay with oxygen Has hospital bed--keeps feet up Has trapeze to ease transfers appetite is okay--less than in past Weight is up from last time--but had to eat fast food and unhealthy stuff  Physical Exam  General:  alert and normal appearance.   Neck:  supple, no masses, no thyromegaly, and no cervical lymphadenopathy.   Lungs:  normal respiratory effort, no intercostal retractions, no accessory muscle use, and normal breath sounds.   Heart:  normal rate, regular rhythm, no murmur, and no gallop.   Pulses:  1+ in feet Extremities:  mild ankle edema Skin:  no suspicious lesions and no ulcerations.   Psych:  normally interactive, good eye contact, not anxious appearing, and not depressed appearing.    Diabetes Management Exam:    Foot Exam (with socks and/or shoes not present):       Sensory-Pinprick/Light touch:          Left medial foot (L-4): diminished          Left dorsal foot (L-5): diminished          Left lateral foot (S-1): diminished          Right medial foot (L-4): diminished          Right dorsal foot (L-5): diminished  Right lateral foot (S-1): diminished       Inspection:          Left foot: abnormal             Comments: slight sore on 1st toe          Right foot: normal       Nails:          Left foot: normal          Right foot: normal    Eye Exam:       Eye Exam done elsewhere          Date: 07/11/2010          Results: optic neuritis but no clear retinopathy          Done by: Dr Oren Bracket   Impression & Recommendations:  Problem # 1:  DIABETES MELLITUS, TYPE II, WITH  NEUROLOGICAL COMPLICATIONS (ICD-250.60) Assessment Unchanged  goal A1c under 9% due to lability  Her updated medication list for this problem includes:    Metformin Hcl 1000 Mg Tabs (Metformin hcl) .Marland Kitchen... Take one by mouth two times a day    Lantus 100 Unit/ml Soln (Insulin glargine) ..... Inject  80 units  subcutaneously each evening as directed    Novolog Flexpen 100 Unit/ml Soln (Insulin aspart) ..... Inject 30 units with each meal    Aspirin 325 Mg Tbec (Aspirin) .Marland Kitchen... Take one by mouth once a day  Labs Reviewed: Creat: 0.9 (08/28/2010)     Last Eye Exam: optic neuritis but no clear retinopathy (07/11/2010) Reviewed HgBA1c results: 9.1 (08/28/2010)  9.1 (04/01/2010)  Orders: Venipuncture (04540) TLB-A1C / Hgb A1C (Glycohemoglobin) (83036-A1C)  Problem # 2:  HYPERTENSION (ICD-401.9) Assessment: Unchanged  good control on meds  Her updated medication list for this problem includes:    Furosemide 40 Mg Tabs (Furosemide) .Marland Kitchen... Take one and one half daily    Carvedilol 25 Mg Tabs (Carvedilol) .Marland Kitchen... Take one tablet by mouth twice a day  BP today: 99/66 Prior BP: 110/68 (09/24/2010)  Labs Reviewed: K+: 4.4 (08/28/2010) Creat: : 0.9 (08/28/2010)   Chol: 202 (08/28/2010)   HDL: 39.20 (08/28/2010)   LDL: 116 (10/30/2008)   TG: 277.0 (08/28/2010)  Orders: TLB-Renal Function Panel (80069-RENAL) TLB-CBC Platelet - w/Differential (85025-CBCD)  Problem # 3:  MULTIPLE SCLEROSIS (ICD-340) Assessment: Comment Only ongoing disability but stable sees neuro  Problem # 4:  DEPRESSION (ICD-311) Assessment: Unchanged mood has been okay  Her updated medication list for this problem includes:    Fluoxetine Hcl 20 Mg Tabs (Fluoxetine hcl) .Marland Kitchen... 1 tab daily for depression    Diazepam 5 Mg Tabs (Diazepam) .Marland Kitchen... 1/2 -1 two times a day as needed for nerves    Trazodone Hcl 100 Mg Tabs (Trazodone hcl) .Marland Kitchen... Take one half in the morning and two at bedtime  Problem # 5:  HYPERLIPIDEMIA  (ICD-272.4) Assessment: Comment Only  she doesn't want to be on med for this will recheck and advise  Labs Reviewed: SGOT: 34 (08/28/2010)   SGPT: 27 (08/28/2010)   HDL:39.20 (08/28/2010), 35.0 (10/30/2008)  LDL:116 (10/30/2008), DEL (06/27/2008)  Chol:202 (08/28/2010), 188 (10/30/2008)  Trig:277.0 (08/28/2010), 186 (10/30/2008)  Orders: TLB-Lipid Panel (80061-LIPID)  Problem # 6:  HYPOTHYROIDISM (ICD-244.9) Assessment: Comment Only  no change  Her updated medication list for this problem includes:    Levothyroxine Sodium 175 Mcg Tabs (Levothyroxine sodium) .Marland Kitchen... 1 tab daily  Labs Reviewed: TSH: 8.20 (08/28/2010)    HgBA1c: 9.1 (08/28/2010) Chol: 202 (08/28/2010)  HDL: 39.20 (08/28/2010)   LDL: 116 (10/30/2008)   TG: 277.0 (08/28/2010)  Orders: TLB-T4 (Thyrox), Free [phone])  Complete Medication List: 1)  Fluoxetine Hcl 20 Mg Tabs (Fluoxetine hcl) .Marland Kitchen.. 1 tab daily for depression 2)  Copaxone 20 Mg/ml Kit (Glatiramer acetate) .Marland Kitchen.. 1 injections once daily 3)  Metformin Hcl 1000 Mg Tabs (Metformin hcl) .... Take one by mouth two times a day 4)  Lantus 100 Unit/ml Soln (Insulin glargine) .... Inject  80 units  subcutaneously each evening as directed 5)  Furosemide 40 Mg Tabs (Furosemide) .... Take one and one half daily 6)  Allopurinol 100 Mg Tabs (Allopurinol) .... Take 1 tablet by mouth once a day 7)  Oxycodone-acetaminophen 5-325 Mg Tabs (Oxycodone-acetaminophen) .Marland Kitchen.. 1-2 tablests by mouth every 4 hours as needed for pain 8)  Klor-con M10 10 Meq Tbcr (Potassium chloride crys cr) .... Take 1 tablet by mouth two times a day 9)  Diazepam 5 Mg Tabs (Diazepam) .... 1/2 -1 two times a day as needed for nerves 10)  Cyclobenzaprine Hcl 10 Mg Tabs (Cyclobenzaprine hcl) .Marland Kitchen.. 1 three times a day as needed for muscle spasm 11)  Meloxicam 15 Mg Tabs (Meloxicam) .Marland Kitchen.. 1 daily as needed for pain 12)  Trazodone Hcl 100 Mg Tabs (Trazodone hcl) .... Take one half in the morning and two at  bedtime 13)  Levothyroxine Sodium 175 Mcg Tabs (Levothyroxine sodium) .Marland Kitchen.. 1 tab daily 14)  Gabapentin 300 Mg Caps (Gabapentin) .... Take 7 by mouth daily 15)  Carvedilol 25 Mg Tabs (Carvedilol) .... Take one tablet by mouth twice a day 16)  Femara 2.5 Mg Tabs (Letrozole) .... Take 1 tablet by mouth once daily 17)  Nicotine 21 Mg/24hr Pt24 (Nicotine) .... Use as directed, qs 1 month 18)  Symbicort 160-4.5 Mcg/act Aero (Budesonide-formoterol fumarate) .... Take one puff two times a day 19)  Omeprazole 20 Mg Cpdr (Omeprazole) .... Take 1 by mouth once daily 20)  Bd Pen Needle Ultrafine 29g X 12.74mm Misc (Insulin pen needle) .... Patient test 3-4 times daily 21)  Novolog Flexpen 100 Unit/ml Soln (Insulin aspart) .... Inject 30 units with each meal 22)  Aspirin 325 Mg Tbec (Aspirin) .... Take one by mouth once a day 23)  Fish Oil Oil (Fish oil) .... Take 1 tablet  by mouth once daily 24)  Calcium-vitamin D 600-200 Mg-unit Tabs (Calcium-vitamin d) .... Take one tablet by mouth daily 25)  Spiriva Handihaler 18 Mcg Caps (Tiotropium bromide monohydrate) .... As needed  Patient Instructions: 1)  Please schedule a follow-up appointment in 4 months .    Orders Added: 1)  Venipuncture [36415] 2)  TLB-A1C / Hgb A1C (Glycohemoglobin) [83036-A1C] 3)  TLB-Renal Function Panel [80069-RENAL] 4)  TLB-CBC Platelet - w/Differential [85025-CBCD] 5)  TLB-Lipid Panel [80061-LIPID] 6)  TLB-T4 (Thyrox), Free [42595-GL8V] 7)  Est. Patient Level IV [56433]    Current Allergies (reviewed today): ! * PENICILLIN * TROVAN * AVANDIA * PERCOCET

## 2011-01-09 ENCOUNTER — Ambulatory Visit: Payer: Self-pay | Admitting: Oncology

## 2011-01-10 LAB — CANCER ANTIGEN 27.29: CA 27.29: 28 U/mL (ref 0.0–38.6)

## 2011-01-16 NOTE — Letter (Signed)
Summary: [facility]  [facility]   Imported By: Sherian Rein 01/06/2011 10:00:21  _____________________________________________________________________  External Attachment:    Type:   Image     Comment:   External Document  Appended Document: [facility] planning lumbar epidural blocks again at next visit

## 2011-01-16 NOTE — Letter (Signed)
Summary: [facility]   Imported By: Kassie Mends 01/10/2011 09:28:30  _____________________________________________________________________  External Attachment:    Type:   Image     Comment:   External Document  Appended Document: [facility] lumbar sympathetic block done

## 2011-01-16 NOTE — Letter (Signed)
Summary: Medical Clearance/[facility]  Medical Clearance/[facility]   Imported By: Sherian Rein 01/06/2011 08:36:51  _____________________________________________________________________  External Attachment:    Type:   Image     Comment:   External Document

## 2011-01-20 ENCOUNTER — Encounter: Payer: Self-pay | Admitting: Internal Medicine

## 2011-01-21 ENCOUNTER — Telehealth: Payer: Self-pay | Admitting: Internal Medicine

## 2011-01-23 ENCOUNTER — Observation Stay: Payer: Self-pay | Admitting: Internal Medicine

## 2011-01-24 DIAGNOSIS — I509 Heart failure, unspecified: Secondary | ICD-10-CM

## 2011-01-28 NOTE — Progress Notes (Signed)
Summary: refill request for diazepam  Phone Note Refill Request Message from:  Fax from Pharmacy  Refills Requested: Medication #1:  DIAZEPAM 5 MG  TABS 1/2 -1 two times a day as needed for nerves   Last Refilled: 12/09/2010 [facility].  Initial call taken by: Lowella Petties CMA, AAMA,  January 21, 2011 8:18 AM  Follow-up for Phone Call        okay #60 x 1 Follow-up by: Cindee Salt MD,  January 21, 2011 9:08 AM  Additional Follow-up for Phone Call Additional follow up Details #1::        Rx faxed to pharmacy Additional Follow-up by: DeShannon Smith CMA Duncan Dull),  January 21, 2011 11:12 AM    Prescriptions: DIAZEPAM 5 MG  TABS (DIAZEPAM) 1/2 -1 two times a day as needed for nerves  #60 x 1   Entered by:   Mervin Hack CMA (AAMA)   Authorized by:   Cindee Salt MD   Signed by:   Mervin Hack CMA (AAMA) on 01/21/2011   Method used:   Handwritten   RxID:   [id number]

## 2011-02-04 ENCOUNTER — Observation Stay: Payer: Self-pay | Admitting: Internal Medicine

## 2011-02-09 ENCOUNTER — Ambulatory Visit: Payer: Self-pay | Admitting: Oncology

## 2011-02-13 ENCOUNTER — Other Ambulatory Visit: Payer: Self-pay | Admitting: Family Medicine

## 2011-02-13 ENCOUNTER — Telehealth: Payer: Self-pay | Admitting: *Deleted

## 2011-02-13 MED ORDER — NYSTATIN 100000 UNIT/GM EX POWD: CUTANEOUS | Status: AC

## 2011-02-13 MED ORDER — FLUCONAZOLE 150 MG PO TABS: 150.0000 mg | ORAL_TABLET | Freq: Once | ORAL | Status: AC

## 2011-02-13 NOTE — Telephone Encounter (Signed)
Daughter says that patient was sent home from rehab facility yesterday with cath. They were treating her for a UTI, today his her last day of the macrobid. Daughter says that her urine is still cloudy, and she isn't really urinating. Daughter is asking if she should try a different antibiotic. [facility]ses [facility] [address].

## 2011-02-13 NOTE — Telephone Encounter (Signed)
Spoke with Lynden Ang Advised her to take out catheter---sounds like it is clogged anyway Can't clear UTI well with it in anyway so I am unsure of the rationale of leaving it in till antibiotics done   Okay to send Rx to [facility] Fluconazole 150mg    #2 x 0     1 now and repeat in 1 week for yeast infection Okay nystatin powder---   1 container x 1   Apply tid for yeast infection

## 2011-02-13 NOTE — Telephone Encounter (Signed)
Daughter called back and is also asking for diflucan and nystatin powder. Patient has yeast infection on her stomach and legs in fold of skin.

## 2011-02-13 NOTE — Telephone Encounter (Signed)
Just received call from Karsten Fells, home health nurse with [facility]. She says that she will be going out this afternoon to see patient. She says that she has order to remove cath after patient finishes antibiotic, but she says that according to daughter the foley catheter is not working, she says that the bag was empty after a full night, and there was some urine around the foley. Lynden Ang is asking if you want her to go ahead a remove the cath. She can be reached at [phone]

## 2011-02-13 NOTE — Telephone Encounter (Signed)
rx sent to [facility]

## 2011-02-17 ENCOUNTER — Telehealth: Payer: Self-pay | Admitting: *Deleted

## 2011-02-17 NOTE — Telephone Encounter (Signed)
Received fax from [facility] stating that Natalia Leatherwood from physical therapy services called to inform you that patient has been open to care (02-14-11) for 4 weeks and they will send a plan of care to the office next week.

## 2011-02-17 NOTE — Telephone Encounter (Signed)
okay

## 2011-02-20 LAB — GLUCOSE, CAPILLARY
Glucose-Capillary: 259 mg/dL — ABNORMAL HIGH (ref 70–99)
Glucose-Capillary: 276 mg/dL — ABNORMAL HIGH (ref 70–99)
Glucose-Capillary: 287 mg/dL — ABNORMAL HIGH (ref 70–99)
Glucose-Capillary: 300 mg/dL — ABNORMAL HIGH (ref 70–99)

## 2011-02-20 LAB — BLOOD GAS, ARTERIAL
Acid-Base Excess: 3.1 mmol/L — ABNORMAL HIGH (ref 0.0–2.0)
Drawn by: 129801
O2 Content: 2.5 L/min
pCO2 arterial: 45.3 mmHg — ABNORMAL HIGH (ref 35.0–45.0)
pH, Arterial: 7.407 — ABNORMAL HIGH (ref 7.350–7.400)
pO2, Arterial: 94.5 mmHg (ref 80.0–100.0)

## 2011-02-20 LAB — LIPID PANEL
HDL: 29 mg/dL — ABNORMAL LOW (ref >39)
LDL Cholesterol: 121 mg/dL — ABNORMAL HIGH (ref 0–99)
Total CHOL/HDL Ratio: 6.3 RATIO
Triglycerides: 168 mg/dL — ABNORMAL HIGH (ref <150)
VLDL: 34 mg/dL (ref 0–40)

## 2011-02-20 LAB — D-DIMER, QUANTITATIVE: D-Dimer, Quant: 0.28 ug/mL-FEU (ref 0.00–0.48)

## 2011-02-20 LAB — CBC
HCT: 45.4 % (ref 36.0–46.0)
Hemoglobin: 15.4 g/dL — ABNORMAL HIGH (ref 12.0–15.0)
MCHC: 33.9 g/dL (ref 30.0–36.0)
MCV: 92.9 fL (ref 78.0–100.0)
Platelets: 177 10*3/uL (ref 150–400)
RBC: 4.94 MIL/uL (ref 3.87–5.11)
WBC: 11 10*3/uL — ABNORMAL HIGH (ref 4.0–10.5)
WBC: 9.4 10*3/uL (ref 4.0–10.5)

## 2011-02-20 LAB — COMPREHENSIVE METABOLIC PANEL
AST: 51 U/L — ABNORMAL HIGH (ref 0–37)
Albumin: 3.7 g/dL (ref 3.5–5.2)
Calcium: 9.2 mg/dL (ref 8.4–10.5)
Chloride: 100 mEq/L (ref 96–112)
Creatinine, Ser: 0.79 mg/dL (ref 0.4–1.2)
GFR calc Af Amer: >60 mL/min (ref >60)
Sodium: 139 mEq/L (ref 135–145)

## 2011-02-20 LAB — DIFFERENTIAL
Basophils Absolute: 0 10*3/uL (ref 0.0–0.1)
Eosinophils Relative: 5 % (ref 0–5)
Lymphocytes Relative: 22 % (ref 12–46)
Lymphs Abs: 2.5 10*3/uL (ref 0.7–4.0)
Monocytes Absolute: 1.1 10*3/uL — ABNORMAL HIGH (ref 0.1–1.0)
Monocytes Relative: 10 % (ref 3–12)
Neutro Abs: 6.8 10*3/uL (ref 1.7–7.7)

## 2011-02-20 LAB — BASIC METABOLIC PANEL
Calcium: 9.3 mg/dL (ref 8.4–10.5)
GFR calc Af Amer: >60 mL/min (ref >60)
GFR calc non Af Amer: >60 mL/min (ref >60)
Potassium: 3.2 mEq/L — ABNORMAL LOW (ref 3.5–5.1)
Sodium: 141 mEq/L (ref 135–145)

## 2011-02-20 LAB — URINALYSIS, ROUTINE W REFLEX MICROSCOPIC
Bilirubin Urine: NEGATIVE
Hgb urine dipstick: NEGATIVE
Nitrite: NEGATIVE
Specific Gravity, Urine: 1.012 (ref 1.005–1.030)
Urobilinogen, UA: 0.2 mg/dL (ref 0.0–1.0)
pH: 5.5 (ref 5.0–8.0)

## 2011-02-20 LAB — POCT CARDIAC MARKERS: Myoglobin, poc: 104 ng/mL (ref 12–200)

## 2011-02-20 LAB — CARDIAC PANEL(CRET KIN+CKTOT+MB+TROPI)
CK, MB: 1.8 ng/mL (ref 0.3–4.0)
CK, MB: 2.3 ng/mL (ref 0.3–4.0)
Relative Index: INVALID (ref 0.0–2.5)
Total CK: 103 U/L (ref 7–177)
Troponin I: 0.02 ng/mL (ref 0.00–0.06)
Troponin I: <0.01 ng/mL (ref 0.00–0.06)

## 2011-02-20 LAB — BRAIN NATRIURETIC PEPTIDE: Pro B Natriuretic peptide (BNP): <30 pg/mL (ref 0.0–100.0)

## 2011-02-20 LAB — CK TOTAL AND CKMB (NOT AT ARMC): CK, MB: 3 ng/mL (ref 0.3–4.0)

## 2011-02-22 ENCOUNTER — Encounter: Payer: Self-pay | Admitting: Internal Medicine

## 2011-02-24 ENCOUNTER — Ambulatory Visit: Payer: 59 | Admitting: Internal Medicine

## 2011-02-24 DIAGNOSIS — G35 Multiple sclerosis: Secondary | ICD-10-CM

## 2011-02-24 DIAGNOSIS — M6281 Muscle weakness (generalized): Secondary | ICD-10-CM

## 2011-02-24 DIAGNOSIS — N390 Urinary tract infection, site not specified: Secondary | ICD-10-CM

## 2011-02-25 ENCOUNTER — Ambulatory Visit: Payer: Self-pay | Admitting: Pain Medicine

## 2011-03-10 ENCOUNTER — Ambulatory Visit (INDEPENDENT_AMBULATORY_CARE_PROVIDER_SITE_OTHER): Payer: 59 | Admitting: Internal Medicine

## 2011-03-10 ENCOUNTER — Encounter: Payer: Self-pay | Admitting: Internal Medicine

## 2011-03-10 VITALS — BP 110/70 | HR 82 | Temp 97.9°F

## 2011-03-10 DIAGNOSIS — R609 Edema, unspecified: Secondary | ICD-10-CM

## 2011-03-10 DIAGNOSIS — R300 Dysuria: Secondary | ICD-10-CM

## 2011-03-10 LAB — POCT URINALYSIS DIPSTICK
Bilirubin, UA: NEGATIVE
Ketones, UA: NEGATIVE
Leukocytes, UA: NEGATIVE
Protein, UA: NEGATIVE
Spec Grav, UA: 1.02
pH, UA: 7

## 2011-03-10 MED ORDER — INSULIN GLARGINE 100 UNIT/ML ~~LOC~~ SOLN: 47.0000 [IU] | Freq: Two times a day (BID) | SUBCUTANEOUS | Status: DC

## 2011-03-10 NOTE — Progress Notes (Signed)
Subjective:    Patient ID: Madeline [name], female    DOB: 1950/12/25, 60 y.o.   MRN: [id number]  HPI Noted increased swelling of right leg since 4 days ago Doubled lasix at suggestion of nurse but not better Left leg not as swollen but has velcro brace on  Some pain around right ankle No redness No calf pain  No fever  Some medial right thigh pain Goes back a month when she was in hospital Doppler negative for clot then  Current outpatient prescriptions:allopurinol (ZYLOPRIM) 100 MG tablet, Take 100 mg by mouth daily.  , Disp: , Rfl: ;  aspirin 325 MG tablet, Take 325 mg by mouth daily.  , Disp: , Rfl: ;  budesonide-formoterol (SYMBICORT) 160-4.5 MCG/ACT inhaler, Inhale 2 puffs into the lungs 2 (two) times daily.  , Disp: , Rfl: ;  Calcium Carbonate-Vitamin D (CALCIUM-VITAMIN D) 600-200 MG-UNIT CAPS, Take by mouth daily.  , Disp: , Rfl:  carvedilol (COREG) 25 MG tablet, Take 25 mg by mouth 2 (two) times daily with a meal.  , Disp: , Rfl: ;  cyclobenzaprine (FLEXERIL) 10 MG tablet, Take 10 mg by mouth 3 (three) times daily as needed.  , Disp: , Rfl: ;  diazepam (VALIUM) 5 MG tablet, Take 1/2 -1 tablet by mouth twice daily as needed for nerves , Disp: , Rfl: ;  fish oil-omega-3 fatty acids 1000 MG capsule, Take 2 g by mouth daily.  , Disp: , Rfl:  FLUoxetine (PROZAC) 20 MG capsule, Take 20 mg by mouth daily.  , Disp: , Rfl: ;  furosemide (LASIX) 40 MG tablet, Take 1 1/2 tablet by mouth once daily , Disp: , Rfl: ;  gabapentin (NEURONTIN) 300 MG capsule, Take 7 tablets by mouth once daily , Disp: , Rfl: ;  glatiramer (COPAXONE) 20 MG/ML injection, Inject 20 mg into the skin daily.  , Disp: , Rfl:  insulin aspart (NOVOLOG) 100 UNIT/ML injection, Inject 30 Units into the skin 3 (three) times daily before meals.  , Disp: , Rfl: ;  insulin glargine (LANTUS) 100 UNIT/ML injection, Inject 80 Units into the skin at bedtime.  , Disp: , Rfl: ;  Insulin Pen Needle (BD ULTRA-FINE PEN NEEDLES) 29G X 12.7MM  MISC, Patient test blood sugar 3-4 times daily , Disp: , Rfl: ;  letrozole (FEMARA) 2.5 MG tablet, Take 2.5 mg by mouth daily.  , Disp: , Rfl:  levothyroxine (SYNTHROID, LEVOTHROID) 175 MCG tablet, Take 175 mcg by mouth daily.  , Disp: , Rfl: ;  meloxicam (MOBIC) 15 MG tablet, Take 15 mg by mouth daily.  , Disp: , Rfl: ;  metFORMIN (GLUCOPHAGE) 1000 MG tablet, Take 1,000 mg by mouth 2 (two) times daily with a meal.  , Disp: , Rfl: ;  nicotine (NICODERM CQ - DOSED IN MG/24 HOURS) 21 mg/24hr patch, USE AS DIRECTED., Disp: 30 patch, Rfl: 2 nystatin (MYCOSTATIN) powder, Apply to affected area 3 times daily, Disp: 30 g, Rfl: 0;  omeprazole (PRILOSEC) 20 MG capsule, Take 20 mg by mouth daily.  , Disp: , Rfl: ;  oxyCODONE-acetaminophen (PERCOCET) 5-325 MG per tablet, NOT SURE OF DOSE , Disp: , Rfl: ;  potassium chloride (KLOR-CON) 10 MEQ CR tablet, Take 10 mEq by mouth 2 (two) times daily.  , Disp: , Rfl:  tiotropium (SPIRIVA) 18 MCG inhalation capsule, Place 18 mcg into inhaler and inhale daily as needed.  , Disp: , Rfl: ;  traZODone (DESYREL) 100 MG tablet, Take 1/2 tablet by mouth in  the morning and 2 tablets at bedtime , Disp: , Rfl:   Past Medical History  Diagnosis Date  . Depression   . Goiter, nodular     multi  . Dyslipidemia   . NIDDM, uncontrolled, with neuropathy   . Hypertension   . Allergy   . GERD (gastroesophageal reflux disease)   . Urinary incontinence   . Peripheral neuropathy   . Multiple sclerosis   . Breast cancer   . Hypothyroidism   . Asthma   . COPD (chronic obstructive pulmonary disease)   . DVT (deep venous thrombosis) 1990's    right leg    Past Surgical History  Procedure Date  . Thyroidectomy 09/2004  . Transthoracic echocardiogram 05/16/2004  . Partial hysterectomy 1975  . Axillary hidradenitis excision 1993    Excision biopsy growth right axilla, benign   . Other surgical history     Thyroid biopsy 10/99  . Carpal tunnel release 03/2008    bilateral  .  Mastectomy, radical 02/2009    left modified  . Tonsillectomy   . Appendectomy     No family history on file.  History   Social History  . Marital Status: Widowed    Spouse Name: N/A    Number of Children: 4  . Years of Education: N/A   Occupational History  . disabled, worked in Public librarian    Social History Main Topics  . Smoking status: Former Smoker    Types: Cigarettes    Quit date: 11/11/2003  . Smokeless tobacco: Never Used  . Alcohol Use: No  . Drug Use: Not on file  . Sexually Active: Not on file   Other Topics Concern  . Not on file   Social History Narrative   Widowed 1999 then 2nd Marriage--2000. Widowed again 2009   Review of Systems No falls or trauma Slight chest pain yesterday--seemed like gas No SOB    Objective:   Physical Exam  Constitutional: She appears well-developed and well-nourished. No distress.  Cardiovascular: Normal rate and regular rhythm.   No murmur heard.      Distant heart sounds  Pulmonary/Chest: Effort normal. No respiratory distress. She has no wheezes. She has no rales.  Musculoskeletal: She exhibits edema.       2-3+ edema in right calf and ankle 1++ edema on left No ankle tenderness Mild tenderness in thigh--both medial and lateral          Assessment & Plan:

## 2011-03-10 NOTE — Patient Instructions (Signed)
Please use the compression hose---at least on right for now Please call if you have pain in the calf or the swelling worsens (and I will order a doppler of the leg)

## 2011-03-11 ENCOUNTER — Ambulatory Visit: Payer: Self-pay | Admitting: Oncology

## 2011-03-19 ENCOUNTER — Telehealth: Payer: Self-pay | Admitting: *Deleted

## 2011-03-19 ENCOUNTER — Emergency Department (HOSPITAL_COMMUNITY)
Admission: EM | Admit: 2011-03-19 | Discharge: 2011-03-19 | Disposition: A | Discharge status: Home / Self Care | Payer: PRIVATE HEALTH INSURANCE | Attending: Emergency Medicine | Admitting: Emergency Medicine

## 2011-03-19 DIAGNOSIS — K219 Gastro-esophageal reflux disease without esophagitis: Secondary | ICD-10-CM | POA: Insufficient documentation

## 2011-03-19 DIAGNOSIS — E669 Obesity, unspecified: Secondary | ICD-10-CM | POA: Insufficient documentation

## 2011-03-19 DIAGNOSIS — M7989 Other specified soft tissue disorders: Secondary | ICD-10-CM

## 2011-03-19 DIAGNOSIS — Z86718 Personal history of other venous thrombosis and embolism: Secondary | ICD-10-CM | POA: Insufficient documentation

## 2011-03-19 DIAGNOSIS — G35 Multiple sclerosis: Secondary | ICD-10-CM | POA: Insufficient documentation

## 2011-03-19 DIAGNOSIS — E119 Type 2 diabetes mellitus without complications: Secondary | ICD-10-CM | POA: Insufficient documentation

## 2011-03-19 DIAGNOSIS — I10 Essential (primary) hypertension: Secondary | ICD-10-CM | POA: Insufficient documentation

## 2011-03-19 DIAGNOSIS — R609 Edema, unspecified: Secondary | ICD-10-CM | POA: Insufficient documentation

## 2011-03-19 NOTE — Telephone Encounter (Signed)
I would not make any changes based on that description If she continues to be concerned, can schedule in here for me to reevaluate

## 2011-03-19 NOTE — Telephone Encounter (Signed)
Pt states she went to [place] and was told that she doesn't have a blood clot, but they did tell her that she could have cellulitis and told her to ask you if you wanted to adjust her lasix dose.  I asked her if her leg was any worse than it was when your saw her on 4/30 and she said it's about the same, not red.

## 2011-03-19 NOTE — Telephone Encounter (Signed)
Phone call from [facility] [place], asking what to do about pt, I left message on her vm to return my call, I also left the message from [facility] on her vm. Spoke with patient and advised results, she will call back and schedule appt if anything changes.

## 2011-03-21 ENCOUNTER — Ambulatory Visit: Payer: 59 | Admitting: Internal Medicine

## 2011-03-24 ENCOUNTER — Ambulatory Visit: Payer: Self-pay | Admitting: Pain Medicine

## 2011-03-25 NOTE — H&P (Signed)
NAME:  Madeline [name], MEHLHOFF NO.:  [id number]   MEDICAL RECORD NO.:  [id number]          PATIENT TYPE:  EMS   LOCATION:  ED                           FACILITY:  [facility]   PHYSICIAN:  Michiel Cowboy, MDDATE OF BIRTH:  [birth year]   DATE OF ADMISSION:  01/17/2009  DATE OF DISCHARGE:                              HISTORY & PHYSICAL   PRIMARY CARE Avontae Burkhead:  Dr. Tillman Abide with [place].   CHIEF COMPLAINT:  Shortness of breath and chest pain.   The patient is a 60 year old female with a past medical history  significant for multiple sclerosis, diabetes and hypertension.  The  patient presented today because she feels that she has been having  worsening lower extremity swelling and abdominal swelling.  She does not  have a history of CHF.  She has been having more shortness of breath  lately, especially today and also a sharp, half semi-circle like pain  that has went from the side of her chest to underneath her breast on the  left.  It was short-lived and felt somewhat dull and deep,  nonexertional, currently resolved.  Otherwise, the patient denies any  nausea or vomiting.  No diarrhea, no constipation.  No fever no chills.  Otherwise, review of systems are unremarkable.   PAST MEDICAL HISTORY:  1. Hypertension.  2. Facial cellulitis in the past.  3. Diabetes.  4. GERD.  5. Multiple sclerosis.  6. Obesity.  7. Gout.   SOCIAL HISTORY:  The patient continues to smoke.  She does not drink  alcohol.  Currently unemployed.   FAMILY HISTORY:  Significant for heart failure in her brother and  hypertension.   ALLERGIES:  1. PENICILLIN.  2. TROVAN.   MEDICATIONS:  1. Allopurinol 100 mg p.o. day.  2. Aspirin 325 mg daily.  3. Fish oil 2000 daily.  4. Lasix 40 mg daily.  5. Neurontin 900 in the evening, the patient is unsure of the dose of      Neurontin in the morning.  6. Potassium, unsure of the dose daily.  7. Levothyroxine 150 mcg daily.  8. Metformin  1000 twice a day.  9. Vicodin as needed.  10.Triamterene/hydrochlorothiazide 37.5/unknown dose once a day.  11.The patient has also recently used meloxicam and trazodone before      bedtime, but does not know her dose either.  12.She also gives herself injections of Copaxone 20 mg subcu daily.   PHYSICAL EXAMINATION:  VITAL SIGNS:  Temperature 97.9, blood pressure  122/73, pulse 93, respirations 22.  Saturating 96% room air.  Heart rate  of 78.  The patient is currently saturating 97% on 2 liters.  GENERAL:  The patient appears to be in no acute distress.  HEENT:  Head nontraumatic.  Moist mucous membranes.  LUNGS:  Very minimal crackles at the bases, but otherwise clear.  HEART:  Regular rate and rhythm.  No murmurs, rubs or gallops.  ABDOMEN:  Soft, but obese.  Nontender, nondistended.  LOWER EXTREMITIES:  Without clubbing or cyanosis.  There is mild non-  pitting edema on the dorsum of feet bilaterally.  The patient  has fairly  obese lower extremities.  NEUROLOGICAL:  The patient is intact.  SKIN:  Clean, dry and intact.   LABORATORY DATA:  White blood cell count 11, hemoglobin 15.4, sodium  141, potassium 3.2, creatinine 0.86.  BNP less than 30.  UA negative.  ABGs showing 7.407/45.3/94.5 on 2 liters.   Chest x-ray showed no active disease.   ASSESSMENT/PLAN:  This is a 60 year old female in no acute distress  complaining of shortness of breath and an episode of chest pain today  which fairly vague.   1. Chest pain.  Given that risk factors include diabetes and      hypertension, we will admit and cycle cardiac enzymes.  We will      check serial EKGs.  We will obtain EKG now.  Check TSH, fasting      lipid panel, hemoglobin A1c.  2. Shortness of breath.  The patient's BNP is currently within normal      limits with no evidence of cardiopulmonary disease on the chest x-      ray with a negative D-dimer.  The patient may have mild oxygen      requirement.  We will see how she  does overnight, we can wean it      off.  I would recommend tobacco cessation and see if the patient in      the morning can have a 6 minute walk off oxygen on room air.      Shortness of breath could be anginal equivalent, see above for      chest pain.  3. Hypertension.  Continue triamterene.  Will hold hydrochlorothiazide      for now.  4. History of lower extremity edema, somewhat mild and mainly in the      dorsum of the feet.  She also has very obese legs.  BNP is normal,      but given the patient's worry for repeated lower extremity edema      and the fact that she was started on Lasix, we will check a 2-D      echo in the morning and follow BNP and orthostatics to avoid over      diuresis.  5. Low potassium.  Will replace.  6. Diabetes mellitus type 2.  We will give sliding scale, hold      metformin.  7. Prophylaxis.  Protonix plus Lovenox.   Dr. Felicity Coyer will assume care in a.m.      Michiel Cowboy, MD  Electronically Signed     AVD/MEDQ  D:  01/17/2009  T:  01/17/2009  Job:  [id number]   cc:   Karie Schwalbe, MD  [address] [place], [place] [address]

## 2011-03-25 NOTE — Op Note (Signed)
NAME:  Madeline [name], Madeline [name]             ACCOUNT NO.:  [id number]   MEDICAL RECORD NO.:  [id number]          PATIENT TYPE:  AMB   LOCATION:  SDS                          FACILITY:  [facility]   PHYSICIAN:  Burnard Bunting, M.D.    DATE OF BIRTH:  28-Nov-1950   DATE OF PROCEDURE:  04/11/2008  DATE OF DISCHARGE:  04/11/2008                               OPERATIVE REPORT   PREOPERATIVE DIAGNOSIS:  Left carpal tunnel syndrome.   POSTOPERATIVE DIAGNOSIS:  Left carpal tunnel syndrome.   PROCEDURE:  Left carpal tunnel release.   SURGEON:  Burnard Bunting, MD   ASSISTANT:  None.   ANESTHESIA:  General endotracheal.   INDICATIONS:  The patient, [name], is a 60 year old patient with carpal  tunnel syndrome who presents now for operative management after failure  of conservative management and explanation of risks and benefits.   PROCEDURE IN DETAIL:  The patient was brought to the operating room  where a general endotracheal anesthesia was used.  Preoperative  antibiotics were administered.  The left hand was prepped with DuraPrep  solution and draped in a sterile manner.  Time-out was called.  The left  arm was elevated.  Esmarch wrap tourniquet was inflated for a total time  of 17 minutes at 250 mmHg.  Kaplan's cardinal line was identified as  well as proximal wrist flexion crease in the radial border of the fourth  finger.  Incision was made at the intersection of Kaplan's cardinal line  in the radial border of the fourth finger and extended to the proximal  wrist flexion crease.  Skin and subcutaneous tissues were sharply  divided.  The palmar fascia was divided.  Transverse carpal ligament was  identified.  It was then incised.  The transcarpal ligament was then  incised longitudinally 2-3 mm along the intersection.  A Freer elevator  was placed between the transcarpal ligament and the median nerve.  The  transverse carpal ligament was then divided under direct visualization  proximally to palmar  fascia and distally.  Motor branch was inspected  and noted to be intact.  The nerve itself was severely compressed,  edematous, and had erythema.  At this time, tourniquet was released.  Bleeding points encountered were controlled with electrocautery.  No  other masses or cysts were noted in the carpal canal.  Solution of  Marcaine was injected into the skin edges which were then closed using 3-  0 nylon suture.  Bulky splint was applied.  The patient tolerated the  procedure well without immediate complications.      Burnard Bunting, M.D.  Electronically Signed     GSD/MEDQ  D:  04/11/2008  T:  04/11/2008  Job:  [id number]

## 2011-03-25 NOTE — Discharge Summary (Signed)
NAME:  Madeline [name], Madeline [name] NO.:  [id number]   MEDICAL RECORD NO.:  [id number]          PATIENT TYPE:  INP   LOCATION:  1439                         [facility]ACILITY:  [facility]   PHYSICIAN:  Barbette Hair. Artist Pais, DO      DATE OF BIRTH:  1951-05-12   DATE OF ADMISSION:  08/09/2008  DATE OF DISCHARGE:  08/13/2008                               DISCHARGE SUMMARY   DISCHARGE DIAGNOSES:  1. Facial erysipelas.  2. Thrombocytopenia resolved.  3. Diabetes mellitus insulin-dependent, uncontrolled.  4. History of multiple sclerosis.  5. Dyslipidemia.  6. His hypertension.  7. Hypothyroidism status post total thyroidectomy.  8. Morbid obesity.  9. Gastroesophageal reflux disease.  10.Gout.  11.Depression.  12.Probable vaginitis secondary to Candida.   DISCHARGE MEDICATIONS:  1. Cephalexin 500 mg 2 tablets twice daily for 7 days.  2. Levothyroxine 150 mcg once daily.  3. Fluoxetine 20 mg once daily.  4. Metformin 1000 mg twice daily.  5. Humalog 30 units before each meal.  6. Lantus 80 units once daily.  7. Gabapentin 300 mg in the a.m. and 3 capsules in the p.m.  8. Allopurinol 100 mg once daily.  9. Furosemide 40 mg once daily.  10.Triamterine hydrochlorothiazide 37.5/25 once daily.  11.Aspirin 325 mg once daily.  12.Ranitidine 150 mg in a.m. and 2 in p.m.  13.Potassium chloride 20 mEq once daily.  14.Diazepam 5 mg 1/2 to 1 tablet twice daily as needed.  15.Fluconazole 150 mg once daily x3 days.   HOSPITAL COURSE:  The patient is a 60 year old white female with  multiple medical problems who developed intractable nausea and vomiting  on September 28.  She developed swelling and redness over her face and  eyes associated with fever.  This progressively worsened to the point  where she could not open her eyes enough to draw up her insulin.  Her  temperature was noted to be 103 degrees Fahrenheit when EMS arrived.  The patient recently completed a 2-week course of steroids for flare  of  her multiple sclerosis.  There was question of periorbital cellulitis  and Infectious Disease was consulted.  She was seen by Dr. Cliffton Asters  who felt her symptoms were consistent with facial erysipelas.  The  patient was started on IV vancomycin.  Her facial edema quickly  improved.  The patient's allergy to penicillin was questionable.  There  was question of edema in the past.  She was transitioned to p.o. Keflex  and did not have any adverse reaction.  On day of discharge for facial  swelling had significantly improved.  Periorbital edema had resolved.  The patient had mild erythema and scaliness to the skin around her face.   The patient's blood sugars during hospitalization were slightly  elevated.  Her blood sugars ranged from low 200s to low 300s.  The  patient's diabetes likely exacerbated by her recent steroid use.  The  patient advised to resume her previous dose of Lantus and Humalog as  outpatient.   Her other medical problems were fairly stable.  She was maintained on  her Levothyroxine and her antihypertensives.  CONDITION ON DISCHARGE:  The patient's facial swelling had significantly  improved.  She was felt medically stable for discharge.  The patient was  advised to contact Dr. Karle Starch office if her symptoms worsened or if  she developed recurrence of facial swelling.      Barbette Hair. Artist Pais, DO  Electronically Signed     RDY/MEDQ  D:  08/13/2008  T:  08/13/2008  Job:  [id number]   cc:   Karie Schwalbe, MD  [address] [place], [place] [address]

## 2011-03-25 NOTE — Discharge Summary (Signed)
NAME:  Madeline, [name] NO.:  [id number]   MEDICAL RECORD NO.:  [id number]          PATIENT TYPE:  INP   LOCATION:  1411                         FACILITY:  [facility]   PHYSICIAN:  Rosalyn Gess. Norins, MD  DATE OF BIRTH:  Jun 19, 1951   DATE OF ADMISSION:  01/17/2009  DATE OF DISCHARGE:  01/19/2009                               DISCHARGE SUMMARY   ADMITTING DIAGNOSES:  1. Shortness of breath.  2. Chest pain rule out myocardial infarction.  3. Lower extremity edema.  4. Hypokalemia.  5. Diabetes.   DISCHARGE DIAGNOSES:  1. Pulmonary embolus ruled out.  2. Myocardial infarction ruled out.  3. Lower extremity edema with negative venous Doppler.  4. Hypokalemia resolved.  5. Diabetes stable with hemoglobin A1c of 7.3%.  6. Breast mass seen on CT angio 3 cm at the anterior left axillary      line suspicious for malignancy.   HISTORY OF PRESENT ILLNESS:  Ms. Madeline [name] is a 60 year old obese Caucasian  female with a history of MS, diabetes, hypertension.  She also gave a  history of having multiple benign tumors in the past.  She presented to  the emergency department for increasing shortness of breath, lower  extremity swelling, and abdominal swelling.  She has no history of CHF  in the past.  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  The patient  also described chest pain that was sharp in a half semicircle radiating  around to underneath her left breast that was short lived in nature.  Because of her risks she is admitted to evaluate her shortness of breath  and to rule her out for MI.   Please see the admission note for past medical history, family history,  social history and admission examination.   Admitting medications include allopurinol 100 mg daily, aspirin 325 mg  daily, fish oil daily, Lasix 40 mg daily, Neurontin 900 mg in the p.m.  and unspecified dose in the a.m., potassium, levothyroxine 150 mcg  daily, metformin 1000 mg twice a day, Vicodin as needed,  Maxzide 37.5/50  once daily, Copaxone 20 mg subcu daily, Lantus 80 mg q.h.s. Humalog 30  units before each meal.   HOSPITAL COURSE:  1. Chest pain.  The patient had a chest pain.  EKG was unremarkable      without any signs of acute change.  She had cardiac enzymes x4 that      were negative.  The patient did undergo 2-D echo performed January 18, 2009 which showed overall left ventricular systolic function to      be normal with an EF of 60%.  There is left ventricular wall      thickness.  There is no wall motion abnormality.  2. Shortness of breath.  The patient's chest x-ray was unremarkable.      The patient did come to the CT angio which showed no signs of      pulmonary embolus.  There was no sign of parenchymal disease.      Lower extremity venous Doppler was negative.  The patient's  O2 sat      on room air at time of discharge was 92%.  She reports she is      always is short of breath.  She does have significant obesity which      may give her Pickwickian type syndrome.  With no underlying      significant etiology for shortness of breath determined she is      thought to be stable and ready for discharge home.  She does not      qualify nor require oxygen at home.  3. Lower extremity edema. As noted above the patient had normal venous      Dopplers.  The patient should use Ace wraps and/or knee-high      support stockings.  4. Hypokalemia. Potassium on admission was 3.2.  The patient had      comprehensive metabolic panel March 11 which showed normal      potassium at 3.5 and this problem is considered resolved.  5. Diabetes.  The patient was initially treated with sliding scale.      She did have hemoglobin A1c actually checked twice which was 7.3%      both times.  The patient was continued on her home regimen and will      continue this at time of discharge.  6. Breast mass.  The patient has noted a small mass of the left      anterior axillary line.  The nurse had  also noted this.  On my      examination I was not certain that there was a mass given the      patient's significant obesity.  CT angio as an incidental finding      did report a 3-cm suspicious lesion in the left breast.  There were      mild interstitial changes around this lesion with enlarged axillary      lymph nodes making this worrisome for breast cancer.  This was      discussed with the patient.  She reports that she has had multiple      benign nodules and tumors.   PLAN:  The patient will need to have outpatient diagnostic mammography  with probable needle biopsy or further diagnostic evaluation.  Her  primary care physician, Dr. Tillman [name], will be notified through our  electronic system of these findings, so that he may assist in arranging  for this.   DISCHARGE EXAMINATION:  VITAL SIGNS: On the morning of her discharge the  patient is afebrile at 97.5, blood pressure 119/69, heart rate 80,  respirations 19, O2 sat was 98% on 3 L and prior to discharge was 92% on  room air.  GENERAL APPEARANCE:  This is an obese Caucasian woman in bed in no acute  distress.  HEENT: Exam is unremarkable.  CHEST:  Patient is moving air well with no rales, wheezes or rhonchi.  BREASTS:  Exam was not performed. Palpation of her axillary mass had  been performed previously and was indeterminate as noted.  CARDIOVASCULAR:  With 2+ radial pulse.  She had a quiet precordium with  regular rate and rhythm.  ABDOMEN: Massively obese, nontender.  GENITOURINARY: Genitalia and rectal exams deferred.  EXTREMITIES:  The patient has large legs.  There is no tenderness in the  calves, negative Homans' sign.  No tenderness to palpation.   FINAL LABORATORIES:  BNP was less than 30.  TSH was 6.8. A1c was 7.3%.  The patient had  D-dimer admission 0.28.  Lipid panel revealed  cholesterol 184, triglycerides 168, HDL was 29, LDL was 121.   DISCHARGE MEDICATIONS:  The patient will continue all her home   medications as listed above with no changes.   DISPOSITION:  The patient is discharged to home.  She will need to have  outpatient mammography and diagnostic studies with appropriate tissue  biopsy.   The patient's condition at time of discharge dictation is stable.      Rosalyn Gess Norins, MD  Electronically Signed     MEN/MEDQ  D:  01/20/2009  T:  01/20/2009  Job:  [id number]   cc:   Karie Schwalbe, MD  [address] [place], [place] [address]

## 2011-03-27 ENCOUNTER — Other Ambulatory Visit: Payer: Self-pay | Admitting: Internal Medicine

## 2011-03-28 NOTE — Op Note (Signed)
NAMEAZAYLIA, FONG NO.:  [id number]   MEDICAL RECORD NO.:  [id number]          PATIENT TYPE:  OBV   LOCATION:  9811                         FACILITY:  [facility]   PHYSICIAN:  Velora Heckler, MD      DATE OF BIRTH:  1951-04-09   DATE OF PROCEDURE:  09/10/2004  DATE OF DISCHARGE:                                 OPERATIVE REPORT   PREOPERATIVE DIAGNOSIS:  Thyroid goiter with a substernal component,  compressive symptoms.   POSTOPERATIVE DIAGNOSIS:  Thyroid goiter with a substernal component,  compressive symptoms.   PROCEDURE:  Total thyroidectomy.   SURGEON:  Velora Heckler, M.D.   ASSESSMENT:  Gita Kudo, M.D.   ANESTHESIA:  General per Dr. Jill Side.   ESTIMATED BLOOD LOSS:  Was 50 mL.   PREPARATION:  Betadine.   COMPLICATIONS:  None.   INDICATIONS FOR PROCEDURE:  The patient is a 60 year old white female  referred by Dr. Cleophas Dunker. Everardo All and Dr. Bryson Dames for a thyroid goiter  with a substernal component and compressive symptoms.  The patient has had a  known goiter since 1999.  The patient had developed compressive symptoms and  tracheal deviation by a CT scan and chest x-ray.  She now comes to surgery  for resection.   DESCRIPTION OF PROCEDURE:  The procedure is done in operating room #6 at the  [facility].  The patient is brought to the operating  room and placed in the supine position on the operating room table.  Following the administration of general anesthesia, the patient is  positioned and then prepped and draped in the usual strict aseptic fashion.  After ascertaining that an adequate level of anesthesia had been obtained, a  Kocher incision is made with the #10 blade.  Dissection is carried down  through the skin and subcutaneous tissues.  Hemostasis is obtained with the  electrocautery.  The platysma is divided with the electrocautery.  Skin  flaps are developed cephalad and caudad from the thyroid  notch to the  sternal notch.  A Mahorner self-retaining retractor is placed for exposure.  The strap muscles are incised in the midline.  Dissection is begun on the  left side.  The strap muscles are reflected laterally.  The left thyroid  lobe appears multinodular. It is the smaller of the two lobes.  It is gently  dissected out.  The venous tributaries are divided between small and medium  Ligaclips.  The superior pole vessels are dissected out, ligated in  continuity with #2-0 silk ties and medium Ligaclips and divided.  The gland  is rolled medially.  The recurrent nerve is identified and preserved.  The  parathyroid tissue was identified and preserved.  The branches of the  inferior thyroid artery are divided between small Ligaclips.  The inferior  venous tributaries are ligated with #2-0 silk ties and divided.  The gland  is rolled up and onto the anterior trachea.  The ligament of Allyson Sabal is  divided using the electrocautery for hemostasis.  The gland is mobilized  across the midline, and the  isthmus is transected with the electrocautery.  The left thyroid lobe is submitted as specimen labeled separately.  Good  hemostasis is obtained in the left neck.  A dry pack is placed in the left  neck.  Next, we turned our attention to the right thyroid lobe.  Again strap  muscles are reflected laterally.  Venous tributaries are divided between  small and medium Ligaclips.  The superior pole was carefully dissected out.  It is ligated in continuity with #2-0 silk ties and medium Ligaclips and  divided.  The inferior and posterior portions of the gland are quite large.  They extend well beneath the head of the right clavicle.  The gland also  extends posteriorly behind the esophagus.  With gentle blunt dissection,  this is mobilized.  The inferior venous tributaries are ligated in  continuity with #2-0 silk ties and divided.  The recurrent laryngeal nerve  is identified and preserved.  The  parathyroid tissue is identified and  preserved.  The branches of the inferior thyroid artery are divided between  small Ligaclips.  The gland is rolled medially.  The ligament of Allyson Sabal is  transected with the electrocautery.  The gland is rolled up and onto the  anterior trachea and completely excised.  The right thyroid lobe is  submitted separately as a specimen to pathology for review.  Both sides of  the neck are irrigated with warm saline.  Surgicel is placed over the area  of the recurrent laryngeal nerves bilaterally.  Good hemostasis is noted.  The strap muscles are reapproximated in the midline with interrupted #3-0  Vicryl sutures.  The platysmal is closed with interrupted #3-0 Vicryl  sutures.  The skin is closed with a running #4-0 Vicryl subcuticular suture.  The wound was washed and dried and Benzoin and Steri-Strips are applied.  Sterile dressings are applied.  The patient is awakened from anesthesia and brought to the recovery room in  stable condition.  The patient tolerated the procedure well.     Todd   TMG/MEDQ  D:  09/10/2004  T:  09/10/2004  Job:  [id number]   cc:   Gregary Signs A. Everardo All, M.D. [facility]   Bryson Dames, M.D.  [place], [place]

## 2011-03-28 NOTE — Discharge Summary (Signed)
NAME:  Madeline [name], Madeline [name]                       ACCOUNT NO.:  [id number]   MEDICAL RECORD NO.:  [id number]                   PATIENT TYPE:  INP   LOCATION:  3712                                 FACILITY:  [facility]   PHYSICIAN:  Rene Paci, M.D. [facility]          DATE OF BIRTH:  1951-10-03   DATE OF ADMISSION:  05/14/2004  DATE OF DISCHARGE:  05/16/2004                                 DISCHARGE SUMMARY   DISCHARGE DIAGNOSES:  1. Vertigo.  2. Benign paroxysmal positional vertigo.  3. Diabetic autonomic dysfunction.  4. Uncontrolled diabetes.  5. Hypertriglyceridemia.  6. Hypertension.  7. Candidiasis.  8. Goiter.   BRIEF ADMISSION HISTORY:  Ms. Deckman is a 60 year old white female who  presented to the emergency department secondary to a diffuse sensation of  weakness.  She stated she just did not feel right.  She admitted left  shoulder pain and broke out in a cold sweat.  She walked to the bathroom  where she felt dizzy but did not lose consciousness.  This sensation lasted  2 to 3 minutes.  The patient presents with presyncopal episode.   PAST MEDICAL HISTORY:  1. Adult-onset diabetes mellitus.  2. Hypercholesterolemia.  3. Right-sided goiter, in the process of having this evaluated.  4. History of DVT, off Coumadin for 6 years.   HOSPITAL COURSE:  #1.  CARDIOVASCULAR:  The patient presented with atypical chest pain.  Serial cardiac enzymes were negative x 2, and myocardial infarction was  ruled out.  It was felt the patient did not require further cardiac  intervention.   #2.  PRESYNCOPE:  This was similar to vertigo.  The patient was not  orthostatic.  We were concerned about benign paroxysmal positional vertigo  versus VBI.  The patient had an MRI/MRA of the brain which was normal.  We  were also concerned about some autonomic dysfunction secondary to diabetes.  At any rate, the patient's symptoms did resolve with an unremarkable workup.  Carotid Dopplers showed no  evidence of ICA stenosis, and the 2-D  echocardiogram was normal.  Again, MRI was normal.   #3.  ADULT-ONSET DIABETES MELLITUS:  This is uncontrolled.  The patient's  hemoglobin A1C was 10.2%. We did increase her Lantus during this admission.   #4.  HYPERCHOLESTEROLEMIA WITH SPECIFICALLY ELEVATED TRIGLYCERIDES AT 289:  Again, we felt this was secondary to uncontrolled diabetes.  She is  currently on Zocor, but we felt she might need additional Welchol added to  her regimen but deferred to her primary care physician.   #5.  HYPERTENSION:  The patient has a long history of hypertension; however,  her blood pressure has normalized, and she has become hypotensive since  losing a significant amount of weight.  Her blood pressure improved with IV  fluids, and we did discontinue her antihypertensives.   #6.  GOITER:  The patient's TSH was normal, and she was going to have  continued outpatient workup with  Dr. Gerrit Friends.   DISCHARGE LABORATORY AND X-RAY DATA:  Carotid Dopplers were negative.   A 2-D echocardiogram revealed an EF of 55 to 65% without wall motion  abnormalities.   Hemoglobin 14.9.  Coags were normal.  CMET was normal except for elevated  glucose.  Hemoglobin A1C was 10.2%.  Cholesterol 140, triglycerides 289, HDL  35, LDL 47.  TSH 0.97.   DISCHARGE MEDICATIONS:  1. Zocor 20 mg Madeline [name].  2. Prozac 20 mg Madeline [name].  3. Multivitamins Madeline [name].  4. Aspirin 325 mg Madeline [name].  5. Lantus 30 units at bedtime.   She was instructed not to take her Glucophage, glyburide, or Altace at  discharge.   FOLLOW UP:  Follow up with Dr. Hildred Laser Wednesday, July 13, at 8 a.m. and  referral to Dr. Gerrit Friends.      Cornell Barman, P.A. [facility]                  Rene Paci, M.D. [facility]    LC/MEDQ  D:  06/11/2004  T:  06/11/2004  Job:  [id number]   cc:   Velora Heckler, M.D.  [address] [place]  [place] [address]  Fax: [phone]   Dr. Hildred Laser

## 2011-04-11 ENCOUNTER — Ambulatory Visit: Payer: Self-pay | Admitting: Oncology

## 2011-04-14 ENCOUNTER — Other Ambulatory Visit: Payer: Self-pay | Admitting: *Deleted

## 2011-04-14 ENCOUNTER — Telehealth: Payer: Self-pay | Admitting: *Deleted

## 2011-04-14 ENCOUNTER — Other Ambulatory Visit: Payer: Self-pay | Admitting: Internal Medicine

## 2011-04-14 MED ORDER — DIAZEPAM 5 MG PO TABS: ORAL_TABLET | ORAL | Status: DC

## 2011-04-14 NOTE — Telephone Encounter (Signed)
Okay #60 x 1 

## 2011-04-14 NOTE — Telephone Encounter (Signed)
Form on your desk  

## 2011-04-14 NOTE — Telephone Encounter (Signed)
rx faxed to pharmacy manually  

## 2011-04-14 NOTE — Telephone Encounter (Signed)
Physical therapist called to report that pt told her she has not had a BM in 4 days and her side is starting to hurt.  She has been using an OTC stool softener, but that isnt helping. Please advise.

## 2011-04-14 NOTE — Telephone Encounter (Signed)
I would recommend that she try 1 capful of miralax every 4-6 hours until her bowels start moving She can then use it once a day to keep her regular If no effective after 4-5 doses, can try dulcolax suppository or enema

## 2011-04-15 ENCOUNTER — Telehealth: Payer: Self-pay | Admitting: *Deleted

## 2011-04-15 NOTE — Telephone Encounter (Signed)
Pt c/o constipation x 5 days, she has taken her stool softner and 4 senna, but states she still has not had a bm. She is eating/ drinking normally. Please advise?

## 2011-04-15 NOTE — Telephone Encounter (Signed)
C/o constipation x 5 days, she has used stool softner

## 2011-04-15 NOTE — Telephone Encounter (Signed)
She probably needs a daily bowel regimen if she is taking pain relievers regularly For now, she should try miralax, a capful with water three times a day until she gets some relief. If she takes this for 2 days without relief, she can try a dulcolax suppository or fleets enema For routine use, i recommend the miralax once a day and 2 senna once or twice a day

## 2011-04-16 NOTE — Telephone Encounter (Signed)
Left message on machine for nurse Victorino Dike with results, advised to call if any questions.

## 2011-04-16 NOTE — Telephone Encounter (Signed)
Spoke with patient and advised results   

## 2011-04-22 ENCOUNTER — Ambulatory Visit: Payer: Self-pay | Admitting: Pain Medicine

## 2011-04-23 ENCOUNTER — Ambulatory Visit: Payer: 59 | Admitting: Internal Medicine

## 2011-04-25 ENCOUNTER — Ambulatory Visit: Payer: 59 | Admitting: Family Medicine

## 2011-04-28 ENCOUNTER — Encounter: Payer: Self-pay | Admitting: Internal Medicine

## 2011-04-28 ENCOUNTER — Ambulatory Visit (INDEPENDENT_AMBULATORY_CARE_PROVIDER_SITE_OTHER): Payer: 59 | Admitting: Internal Medicine

## 2011-04-28 VITALS — BP 142/70 | HR 89 | Temp 98.3°F | Ht 67.0 in | Wt 280.0 lb

## 2011-04-28 DIAGNOSIS — F329 Major depressive disorder, single episode, unspecified: Secondary | ICD-10-CM

## 2011-04-28 MED ORDER — BUPROPION HCL ER (XL) 150 MG PO TB24: 150.0000 mg | ORAL_TABLET | ORAL | Status: DC

## 2011-04-28 MED ORDER — TRAZODONE HCL 100 MG PO TABS: ORAL_TABLET | ORAL | Status: DC

## 2011-04-28 NOTE — Assessment & Plan Note (Signed)
Worse Discussed alternatives She should try to get out to church Doesn't want counselling Will add bupropion

## 2011-04-28 NOTE — Patient Instructions (Signed)
Please cancel the June 27th visit Take the bupropion along with the fluoxetine

## 2011-04-28 NOTE — Progress Notes (Signed)
Subjective:    Patient ID: Madeline [name], female    DOB: 02/26/1951, 60 y.o.   MRN: [id number]  HPI Her daughter Madeline [name] is here as well  Has been very depressed Crying a lot Daughter notes some paranoia---hears a fragment of something then misconstrues what is said Still taking the fluoxetine Anxiety also active--uses this bid regularly. Daughter feels she needs this  Mood seemed to get worse as her foot got better Madeline [name] is important to her but can't get to church as often as she would like--daughter does offer to bring her Had grief counselling when 1st husband died about 12 years ago--this did help Concerned about her insurance so doesn't want to try counselling  Current Outpatient Prescriptions on File Prior to Visit  Medication Sig Dispense Refill  . allopurinol (ZYLOPRIM) 100 MG tablet Take 100 mg by mouth daily.        Marland Kitchen aspirin 325 MG tablet Take 325 mg by mouth daily.        . budesonide-formoterol (SYMBICORT) 160-4.5 MCG/ACT inhaler Inhale 2 puffs into the lungs 2 (two) times daily.        . Calcium Carbonate-Vitamin D (CALCIUM-VITAMIN D) 600-200 MG-UNIT CAPS Take by mouth daily.        . carvedilol (COREG) 25 MG tablet Take 25 mg by mouth 2 (two) times daily with a meal.        . cyclobenzaprine (FLEXERIL) 10 MG tablet Take 10 mg by mouth 3 (three) times daily as needed.        . diazepam (VALIUM) 5 MG tablet Take 1/2 -1 tablet by mouth twice daily as needed for nerves  60 tablet  1  . fish oil-omega-3 fatty acids 1000 MG capsule Take 2 g by mouth daily.        Marland Kitchen FLUoxetine (PROZAC) 20 MG capsule Take 20 mg by mouth daily.        . furosemide (LASIX) 40 MG tablet Take 1 1/2 tablet by mouth once daily       . gabapentin (NEURONTIN) 300 MG capsule Take 7 tablets by mouth once daily       . glatiramer (COPAXONE) 20 MG/ML injection Inject 20 mg into the skin daily.        . insulin aspart (NOVOLOG) 100 UNIT/ML injection Inject 30 Units into the skin 3 (three) times daily  before meals.        . insulin glargine (LANTUS SOLOSTAR) 100 UNIT/ML injection Inject 47 Units into the skin 2 (two) times daily.  30 mL  12  . Insulin Pen Needle (BD ULTRA-FINE PEN NEEDLES) 29G X 12.7MM MISC Patient test blood sugar 3-4 times daily       . letrozole (FEMARA) 2.5 MG tablet Take 2.5 mg by mouth daily.        Marland Kitchen levothyroxine (SYNTHROID, LEVOTHROID) 175 MCG tablet Take 175 mcg by mouth daily.        . meloxicam (MOBIC) 15 MG tablet TAKE ONE TABLET BY MOUTH DAILY AS NEEDED FOR PAIN  30 tablet  2  . metFORMIN (GLUCOPHAGE) 1000 MG tablet Take 1,000 mg by mouth 2 (two) times daily with a meal.        . nicotine (NICODERM CQ - DOSED IN MG/24 HOURS) 21 mg/24hr patch USE AS DIRECTED.  30 patch  2  . nystatin (MYCOSTATIN) powder Apply to affected area 3 times daily  30 g  0  . omeprazole (PRILOSEC) 20 MG capsule TAKE ONE CAPSULE BY MOUTH ONE  TIME DAILY  30 capsule  11  . oxyCODONE-acetaminophen (PERCOCET) 5-325 MG per tablet NOT SURE OF DOSE       . potassium chloride (KLOR-CON) 10 MEQ CR tablet Take 10 mEq by mouth 2 (two) times daily.        Marland Kitchen tiotropium (SPIRIVA) 18 MCG inhalation capsule Place 18 mcg into inhaler and inhale daily as needed.        Marland Kitchen DISCONTD: traZODone (DESYREL) 100 MG tablet Take 1/2 tablet by mouth in the morning and 2 tablets at bedtime        Past Medical History  Diagnosis Date  . Depression   . Goiter, nodular     multi  . Dyslipidemia   . NIDDM, uncontrolled, with neuropathy   . Hypertension   . Allergy   . GERD (gastroesophageal reflux disease)   . Urinary incontinence   . Peripheral neuropathy   . Multiple sclerosis   . Breast cancer   . Hypothyroidism   . Asthma   . COPD (chronic obstructive pulmonary disease)   . DVT (deep venous thrombosis) 1990's    right leg    Past Surgical History  Procedure Date  . Thyroidectomy 09/2004  . Transthoracic echocardiogram 05/16/2004  . Partial hysterectomy 1975  . Axillary hidradenitis excision 1993     Excision biopsy growth right axilla, benign   . Other surgical history     Thyroid biopsy 10/99  . Carpal tunnel release 03/2008    bilateral  . Mastectomy, radical 02/2009    left modified  . Tonsillectomy   . Appendectomy     No family history on file.  History   Social History  . Marital Status: Widowed    Spouse Name: N/A    Number of Children: 4  . Years of Education: N/A   Occupational History  . disabled, worked in Public librarian    Social History Main Topics  . Smoking status: Former Smoker    Types: Cigarettes    Quit date: 11/11/2003  . Smokeless tobacco: Never Used  . Alcohol Use: No  . Drug Use: Not on file  . Sexually Active: Not on file   Other Topics Concern  . Not on file   Social History Narrative   Widowed 1999 then 2nd Marriage--2000. Widowed again 2009   Review of Systems 6 people in household---double wide trailer Appetite is okay Has lost some weight---trying to be careful Sleeps fairly well--trazodone helps Hearing has really gone down    Objective:   Physical Exam  Constitutional: She appears well-developed and well-nourished. No distress.  Musculoskeletal:       Slow gait and slightly unstable--uses cane effectively  Psychiatric: Her behavior is normal. Judgment and thought content normal.       Depressed but appropriate affect          Assessment & Plan:

## 2011-04-30 ENCOUNTER — Telehealth: Payer: Self-pay | Admitting: *Deleted

## 2011-04-30 ENCOUNTER — Ambulatory Visit: Payer: Self-pay | Admitting: Pain Medicine

## 2011-04-30 DIAGNOSIS — S90529A Blister (nonthermal), unspecified ankle, initial encounter: Secondary | ICD-10-CM

## 2011-04-30 NOTE — Telephone Encounter (Signed)
Home health physical therapist called to report that pt has a blister on the back of her right ankle, about the size of a quarter.  Pt thinks this is from the new ankle brace that she has but therapist says the back of the brace is open so there is not any pressure there.  She is asking for a referral for home health nurse to evaluate.  Order will need to be faxed to [phone].

## 2011-05-01 NOTE — Telephone Encounter (Signed)
Okay to make referral to home health nurse to eval blister

## 2011-05-01 NOTE — Telephone Encounter (Signed)
New Wound care order faxed to [facility] care per therapists request.

## 2011-05-07 ENCOUNTER — Ambulatory Visit: Payer: 59 | Admitting: Internal Medicine

## 2011-05-08 LAB — CANCER ANTIGEN 27.29: CA 27.29: 20.3 U/mL (ref 0.0–38.6)

## 2011-05-09 ENCOUNTER — Other Ambulatory Visit: Payer: Self-pay | Admitting: Internal Medicine

## 2011-05-11 ENCOUNTER — Ambulatory Visit: Payer: Self-pay | Admitting: Internal Medicine

## 2011-05-11 ENCOUNTER — Ambulatory Visit: Payer: Self-pay | Admitting: Oncology

## 2011-05-22 ENCOUNTER — Ambulatory Visit: Payer: Self-pay | Admitting: Pain Medicine

## 2011-05-30 ENCOUNTER — Ambulatory Visit: Payer: 59 | Admitting: Internal Medicine

## 2011-06-11 ENCOUNTER — Ambulatory Visit: Payer: Self-pay | Admitting: Pain Medicine

## 2011-06-16 ENCOUNTER — Ambulatory Visit: Payer: Self-pay | Admitting: Oncology

## 2011-06-20 ENCOUNTER — Other Ambulatory Visit: Payer: Self-pay | Admitting: Internal Medicine

## 2011-07-02 ENCOUNTER — Other Ambulatory Visit: Payer: Self-pay | Admitting: Internal Medicine

## 2011-07-08 ENCOUNTER — Ambulatory Visit: Payer: Self-pay | Admitting: Pain Medicine

## 2011-07-10 ENCOUNTER — Other Ambulatory Visit: Payer: Self-pay | Admitting: Internal Medicine

## 2011-07-10 NOTE — Telephone Encounter (Signed)
rx sent to pharmacy by e-script  

## 2011-07-12 ENCOUNTER — Ambulatory Visit: Payer: Self-pay | Admitting: Oncology

## 2011-07-16 ENCOUNTER — Telehealth: Payer: Self-pay | Admitting: *Deleted

## 2011-07-16 NOTE — Telephone Encounter (Signed)
Form for diabetic supplies is on your desk.  I checked with the patient and she does want these supplies. 

## 2011-07-16 NOTE — Telephone Encounter (Signed)
Form faxed back to [facility]

## 2011-07-16 NOTE — Telephone Encounter (Signed)
Form done 

## 2011-07-23 ENCOUNTER — Ambulatory Visit: Payer: Self-pay | Admitting: Pain Medicine

## 2011-07-26 ENCOUNTER — Other Ambulatory Visit: Payer: Self-pay | Admitting: Internal Medicine

## 2011-08-07 ENCOUNTER — Ambulatory Visit: Payer: Self-pay | Admitting: Pain Medicine

## 2011-08-07 LAB — BASIC METABOLIC PANEL
CO2: 26
Chloride: 98
GFR calc Af Amer: >60
Potassium: 3.9

## 2011-08-07 LAB — URINE MICROSCOPIC-ADD ON

## 2011-08-07 LAB — URINALYSIS, ROUTINE W REFLEX MICROSCOPIC
Glucose, UA: 100 — AB
Hgb urine dipstick: NEGATIVE
Specific Gravity, Urine: 1.027
Urobilinogen, UA: 0.2

## 2011-08-07 LAB — CBC
Hemoglobin: 16.3 — ABNORMAL HIGH
RBC: 5.21 — ABNORMAL HIGH

## 2011-08-09 ENCOUNTER — Other Ambulatory Visit: Payer: Self-pay | Admitting: Internal Medicine

## 2011-08-11 ENCOUNTER — Ambulatory Visit: Payer: Self-pay | Admitting: Oncology

## 2011-08-11 LAB — BASIC METABOLIC PANEL
BUN: 6
CO2: 26
CO2: 28
Chloride: 101
Chloride: 102
Creatinine, Ser: 0.66
GFR calc Af Amer: >60
GFR calc non Af Amer: >60
GFR calc non Af Amer: >60
Glucose, Bld: 207 — ABNORMAL HIGH
Potassium: 3.5
Potassium: 3.6
Potassium: 4
Sodium: 133 — ABNORMAL LOW
Sodium: 135
Sodium: 138

## 2011-08-11 LAB — CBC
HCT: 38
HCT: 40
HCT: 42.3
Hemoglobin: 13
Hemoglobin: 13.1
Hemoglobin: 13.5
Hemoglobin: 14.3
MCHC: 34
MCHC: 33.7
MCHC: 33.8
MCV: 94
Platelets: 108 — ABNORMAL LOW
Platelets: 113 — ABNORMAL LOW
Platelets: 142 — ABNORMAL LOW
RBC: 4.95
RBC: 4.06
RBC: 4.26
RDW: 13.6
RDW: 13.5
RDW: 13.6
WBC: 6.9
WBC: 7.3
WBC: 9.7

## 2011-08-11 LAB — LIPID PANEL
HDL: 17 — ABNORMAL LOW
LDL Cholesterol: 108 — ABNORMAL HIGH
Triglycerides: 114
VLDL: 23

## 2011-08-11 LAB — GLUCOSE, CAPILLARY
Glucose-Capillary: 319 — ABNORMAL HIGH
Glucose-Capillary: 325 — ABNORMAL HIGH
Glucose-Capillary: 129 — ABNORMAL HIGH
Glucose-Capillary: 145 — ABNORMAL HIGH
Glucose-Capillary: 149 — ABNORMAL HIGH
Glucose-Capillary: 155 — ABNORMAL HIGH
Glucose-Capillary: 178 — ABNORMAL HIGH
Glucose-Capillary: 189 — ABNORMAL HIGH
Glucose-Capillary: 195 — ABNORMAL HIGH
Glucose-Capillary: 207 — ABNORMAL HIGH
Glucose-Capillary: 208 — ABNORMAL HIGH
Glucose-Capillary: 210 — ABNORMAL HIGH
Glucose-Capillary: 211 — ABNORMAL HIGH
Glucose-Capillary: 221 — ABNORMAL HIGH
Glucose-Capillary: 229 — ABNORMAL HIGH
Glucose-Capillary: 235 — ABNORMAL HIGH
Glucose-Capillary: 237 — ABNORMAL HIGH
Glucose-Capillary: 246 — ABNORMAL HIGH
Glucose-Capillary: 248 — ABNORMAL HIGH
Glucose-Capillary: 270 — ABNORMAL HIGH
Glucose-Capillary: 275 — ABNORMAL HIGH
Glucose-Capillary: 286 — ABNORMAL HIGH
Glucose-Capillary: 299 — ABNORMAL HIGH
Glucose-Capillary: 312 — ABNORMAL HIGH
Glucose-Capillary: 327 — ABNORMAL HIGH
Glucose-Capillary: 328 — ABNORMAL HIGH

## 2011-08-11 LAB — COMPREHENSIVE METABOLIC PANEL
ALT: 29
AST: 25
Albumin: 3 — ABNORMAL LOW
Calcium: 8.1 — ABNORMAL LOW
Creatinine, Ser: 0.87
GFR calc Af Amer: >60
Sodium: 130 — ABNORMAL LOW
Total Protein: 6.3

## 2011-08-11 LAB — URINE CULTURE: Colony Count: 45000

## 2011-08-11 LAB — CULTURE, BLOOD (ROUTINE X 2)

## 2011-08-11 LAB — URINALYSIS, ROUTINE W REFLEX MICROSCOPIC
Bilirubin Urine: NEGATIVE
Hgb urine dipstick: NEGATIVE
Ketones, ur: 40 — AB
Nitrite: NEGATIVE
pH: 5.5

## 2011-08-11 LAB — HEMOGLOBIN A1C: Hgb A1c MFr Bld: 8.1 — ABNORMAL HIGH

## 2011-08-11 LAB — DIFFERENTIAL
Eosinophils Absolute: 0
Eosinophils Relative: 0
Eosinophils Relative: 6 — ABNORMAL HIGH
Lymphocytes Relative: 9 — ABNORMAL LOW
Lymphocytes Relative: 30
Lymphs Abs: 1.2
Lymphs Abs: 2.1
Monocytes Absolute: 0.8
Monocytes Relative: 12
Monocytes Relative: 12

## 2011-08-11 LAB — URINE MICROSCOPIC-ADD ON

## 2011-08-13 ENCOUNTER — Encounter: Payer: Self-pay | Admitting: Internal Medicine

## 2011-08-13 ENCOUNTER — Ambulatory Visit (INDEPENDENT_AMBULATORY_CARE_PROVIDER_SITE_OTHER): Payer: 59 | Admitting: Internal Medicine

## 2011-08-13 VITALS — BP 94/68 | HR 83 | Temp 97.8°F | Wt 260.0 lb

## 2011-08-13 DIAGNOSIS — I10 Essential (primary) hypertension: Secondary | ICD-10-CM

## 2011-08-13 DIAGNOSIS — Z23 Encounter for immunization: Secondary | ICD-10-CM

## 2011-08-13 DIAGNOSIS — E1149 Type 2 diabetes mellitus with other diabetic neurological complication: Secondary | ICD-10-CM

## 2011-08-13 DIAGNOSIS — G35 Multiple sclerosis: Secondary | ICD-10-CM

## 2011-08-13 DIAGNOSIS — F329 Major depressive disorder, single episode, unspecified: Secondary | ICD-10-CM

## 2011-08-13 DIAGNOSIS — G35D Multiple sclerosis, unspecified: Secondary | ICD-10-CM

## 2011-08-13 DIAGNOSIS — F3289 Other specified depressive episodes: Secondary | ICD-10-CM

## 2011-08-13 DIAGNOSIS — J4489 Other specified chronic obstructive pulmonary disease: Secondary | ICD-10-CM

## 2011-08-13 DIAGNOSIS — J449 Chronic obstructive pulmonary disease, unspecified: Secondary | ICD-10-CM

## 2011-08-13 NOTE — Assessment & Plan Note (Signed)
Some cough but doesn't seem sick--?allergy related Will proceed with flu shot No change in rx

## 2011-08-13 NOTE — Progress Notes (Signed)
Subjective:    Patient ID: Madeline [name], female    DOB: [birth year], 60 y.o.   MRN: [id number]  HPI Here with daughter again  Mood is better now Only took the bupropion for 2 weeks--didn't make her feel right Really upbeat now Still uses the valium bid for nerves  Has been checking sugars tid Doesn't need as much of the novolog 150-200, 2 units; 201-250, 4 units.... And so on) 47 bid of lantus No recent hypoglycemic spells Keeps up with Dr Oren Bracket for her eyes  No chest pain Still gets DOE from COPD---fairly stable No palpitations No sig edema--uses Jobst stockings  MS seems better  Able to walk again  Current Outpatient Prescriptions on File Prior to Visit  Medication Sig Dispense Refill  . allopurinol (ZYLOPRIM) 100 MG tablet Take 100 mg by mouth daily.        Marland Kitchen aspirin 325 MG tablet Take 325 mg by mouth daily.        . budesonide-formoterol (SYMBICORT) 160-4.5 MCG/ACT inhaler Inhale 2 puffs into the lungs 2 (two) times daily.        . Calcium Carbonate-Vitamin D (CALCIUM-VITAMIN D) 600-200 MG-UNIT CAPS Take by mouth daily.        . carvedilol (COREG) 25 MG tablet Take 25 mg by mouth 2 (two) times daily with a meal.        . cyclobenzaprine (FLEXERIL) 10 MG tablet Take 10 mg by mouth 3 (three) times daily as needed.        . diazepam (VALIUM) 5 MG tablet Take 1/2 -1 tablet by mouth twice daily as needed for nerves  60 tablet  1  . fish oil-omega-3 fatty acids 1000 MG capsule Take 2 g by mouth daily.        Marland Kitchen FLUoxetine (PROZAC) 20 MG capsule Take 20 mg by mouth daily.        . furosemide (LASIX) 40 MG tablet Take 1 1/2 tablet by mouth once daily       . gabapentin (NEURONTIN) 300 MG capsule Take 7 tablets by mouth once daily       . glatiramer (COPAXONE) 20 MG/ML injection Inject 20 mg into the skin daily.        . insulin glargine (LANTUS SOLOSTAR) 100 UNIT/ML injection Inject 47 Units into the skin 2 (two) times daily.  30 mL  12  . Insulin Pen Needle (BD ULTRA-FINE  PEN NEEDLES) 29G X 12.7MM MISC Patient test blood sugar 3-4 times daily       . letrozole (FEMARA) 2.5 MG tablet Take 2.5 mg by mouth daily.        Marland Kitchen levothyroxine (SYNTHROID, LEVOTHROID) 175 MCG tablet Take 175 mcg by mouth daily.        . meloxicam (MOBIC) 15 MG tablet TAKE ONE TABLET BY MOUTH DAILY AS NEEDED FOR PAIN  30 tablet  1  . metFORMIN (GLUCOPHAGE) 1000 MG tablet TAKE ONE TABLET BY MOUTH TWICE DAILY  180 tablet  1  . nicotine (NICODERM CQ - DOSED IN MG/24 HOURS) 21 mg/24hr patch USE AS DIRECTED.  30 patch  2  . nystatin (MYCOSTATIN) powder Apply to affected area 3 times daily  30 g  0  . omeprazole (PRILOSEC) 20 MG capsule TAKE ONE CAPSULE BY MOUTH ONE TIME DAILY  30 capsule  11  . oxyCODONE-acetaminophen (PERCOCET) 5-325 MG per tablet NOT SURE OF DOSE       . potassium chloride (K-DUR) 10 MEQ tablet TAKE TWO TABLETS BY  MOUTH DAILY  60 tablet  0  . tiotropium (SPIRIVA) 18 MCG inhalation capsule Place 18 mcg into inhaler and inhale daily as needed.        . traZODone (DESYREL) 100 MG tablet TAKE ONE-HALF TABLET BY MOUTH DAILY IN THE MORNING AND TAKE TWOTABLETS BY MOUTH AT BEDTIME  75 tablet  11    Allergies  Allergen Reactions  . Oxycodone-Acetaminophen     REACTION: unspecified  . Penicillins     REACTION: tongue and throat swelling tolerates cephalosporins  . Rosiglitazone Maleate     REACTION: swelling    Past Medical History  Diagnosis Date  . Depression   . Goiter, nodular     multi  . Dyslipidemia   . NIDDM, uncontrolled, with neuropathy   . Hypertension   . Allergy   . GERD (gastroesophageal reflux disease)   . Urinary incontinence   . Peripheral neuropathy   . Multiple sclerosis   . Breast cancer   . Hypothyroidism   . Asthma   . COPD (chronic obstructive pulmonary disease)   . DVT (deep venous thrombosis) 1990's    right leg    Past Surgical History  Procedure Date  . Thyroidectomy 09/2004  . Transthoracic echocardiogram 05/16/2004  . Partial  hysterectomy 1975  . Axillary hidradenitis excision 1993    Excision biopsy growth right axilla, benign   . Other surgical history     Thyroid biopsy 10/99  . Carpal tunnel release 03/2008    bilateral  . Mastectomy, radical 02/2009    left modified  . Tonsillectomy   . Appendectomy     No family history on file.  History   Social History  . Marital Status: Widowed    Spouse Name: N/A    Number of Children: 4  . Years of Education: N/A   Occupational History  . disabled, worked in Public librarian    Social History Main Topics  . Smoking status: Former Smoker    Types: Cigarettes    Quit date: 11/11/2003  . Smokeless tobacco: Never Used   Comment: did restart after husband's death but stopped again  . Alcohol Use: No  . Drug Use: Not on file  . Sexually Active: Not on file   Other Topics Concern  . Not on file   Social History Narrative   Widowed 1999 then 2nd Marriage--2000. Widowed again 2009   Review of Systems Eating less and better Weight is down 20#! Sleeping okay Has chronic cough but doesn't feel sick--goes back 3 weeks     Objective:   Physical Exam  Constitutional: She appears well-developed and well-nourished. No distress.  Neck: Normal range of motion. Neck supple.  Cardiovascular: Normal rate, regular rhythm and normal heart sounds.        Faint distal pulses  Pulmonary/Chest: Effort normal and breath sounds normal. No respiratory distress. She has no wheezes. She has no rales.  Musculoskeletal: She exhibits no edema and no tenderness.  Lymphadenopathy:    She has no cervical adenopathy.  Skin:       Broken blister on bottom of left great toe Not infected  Psychiatric: She has a normal mood and affect. Her behavior is normal. Judgment and thought content normal.          Assessment & Plan:

## 2011-08-13 NOTE — Assessment & Plan Note (Signed)
BP Readings from Last 3 Encounters:  08/13/11 94/68  04/28/11 142/70  03/10/11 110/70   Lower today--?due to weight loss No change for now

## 2011-08-13 NOTE — Assessment & Plan Note (Addendum)
Improved She stopped her copaxone when she started walking Urged her to stay on

## 2011-08-13 NOTE — Assessment & Plan Note (Signed)
Improved control Will check A1c again Lab Results  Component Value Date   HGBA1C 8.2* 01/01/2011

## 2011-08-13 NOTE — Assessment & Plan Note (Signed)
Improved now Seems to coincide with improved MS Continue meds

## 2011-08-14 LAB — HEMOGLOBIN A1C: Hgb A1c MFr Bld: 7.5 % — ABNORMAL HIGH (ref 4.6–6.5)

## 2011-08-15 ENCOUNTER — Other Ambulatory Visit: Payer: Self-pay | Admitting: *Deleted

## 2011-08-15 MED ORDER — DIAZEPAM 5 MG PO TABS: ORAL_TABLET | ORAL | Status: DC

## 2011-08-15 NOTE — Telephone Encounter (Signed)
Rx called to [facility], patient advised via telephone.

## 2011-08-15 NOTE — Telephone Encounter (Signed)
Phoned request from pt, she said pharmacy is to fax request also.

## 2011-08-19 ENCOUNTER — Encounter: Payer: Self-pay | Admitting: *Deleted

## 2011-08-20 ENCOUNTER — Other Ambulatory Visit: Payer: Self-pay | Admitting: Internal Medicine

## 2011-08-25 ENCOUNTER — Telehealth: Payer: Self-pay | Admitting: *Deleted

## 2011-08-25 MED ORDER — CEFUROXIME AXETIL 250 MG PO TABS: 250.0000 mg | ORAL_TABLET | Freq: Two times a day (BID) | ORAL | Status: DC

## 2011-08-25 NOTE — Telephone Encounter (Signed)
Pt was seen for cough.  She says she is not any better.  Has a lot of sinus drainage, cough.  Asks what she can do. [facility]ses [facility].  Doesn't want to take anything otc without checking with you first.

## 2011-08-25 NOTE — Telephone Encounter (Signed)
If still coughing, should try antibiotic at this point Please send Rx for ceftin 250 bid (#20 x 0) Can use tylenol over the counter Honey may help the cough or she can try some dextromethorphan but be careful not to use more than recommended on label

## 2011-08-25 NOTE — Telephone Encounter (Signed)
Patient notified as instructed via telephone.  Rx sent to [facility].

## 2011-08-26 ENCOUNTER — Ambulatory Visit (INDEPENDENT_AMBULATORY_CARE_PROVIDER_SITE_OTHER)
Admission: RE | Admit: 2011-08-26 | Discharge: 2011-08-26 | Disposition: A | Discharge status: Home / Self Care | Payer: Medicare Other | Source: Ambulatory Visit | Attending: Internal Medicine | Admitting: Internal Medicine

## 2011-08-26 ENCOUNTER — Encounter: Payer: Self-pay | Admitting: Internal Medicine

## 2011-08-26 ENCOUNTER — Other Ambulatory Visit: Payer: Self-pay | Admitting: Internal Medicine

## 2011-08-26 ENCOUNTER — Ambulatory Visit (INDEPENDENT_AMBULATORY_CARE_PROVIDER_SITE_OTHER): Payer: Medicare Other | Admitting: Internal Medicine

## 2011-08-26 ENCOUNTER — Other Ambulatory Visit: Payer: Self-pay

## 2011-08-26 VITALS — BP 122/84 | HR 76 | Temp 98.4°F | Wt 260.0 lb

## 2011-08-26 DIAGNOSIS — M25572 Pain in left ankle and joints of left foot: Secondary | ICD-10-CM

## 2011-08-26 DIAGNOSIS — M25579 Pain in unspecified ankle and joints of unspecified foot: Secondary | ICD-10-CM

## 2011-08-26 NOTE — Progress Notes (Signed)
Subjective:    Patient ID: Madeline [name], female    DOB: 21-Apr-1951, 60 y.o.   MRN: [id number]  HPI Cooking supper last night  Twisted left foot --no fall Then started using walking boot again and elevation Still feels funny Has been using the pain pills which help Not particularly swollen  Using boot and cane  Current Outpatient Prescriptions on File Prior to Visit  Medication Sig Dispense Refill  . allopurinol (ZYLOPRIM) 100 MG tablet Take 100 mg by mouth daily.        Marland Kitchen aspirin 325 MG tablet Take 325 mg by mouth daily.        . budesonide-formoterol (SYMBICORT) 160-4.5 MCG/ACT inhaler Inhale 2 puffs into the lungs 2 (two) times daily.        . Calcium Carbonate-Vitamin D (CALCIUM-VITAMIN D) 600-200 MG-UNIT CAPS Take by mouth daily.        . carvedilol (COREG) 25 MG tablet Take 25 mg by mouth 2 (two) times daily with a meal.        . cefUROXime (CEFTIN) 250 MG tablet Take 1 tablet (250 mg total) by mouth 2 (two) times daily.  20 tablet  0  . cyclobenzaprine (FLEXERIL) 10 MG tablet Take 10 mg by mouth 3 (three) times daily as needed.        . diazepam (VALIUM) 5 MG tablet Take 1/2 -1 tablet by mouth twice daily as needed for nerves  60 tablet  1  . fish oil-omega-3 fatty acids 1000 MG capsule Take 2 g by mouth daily.        Marland Kitchen FLUoxetine (PROZAC) 20 MG capsule Take 20 mg by mouth daily.        . furosemide (LASIX) 40 MG tablet Take 1 1/2 tablet by mouth once daily       . gabapentin (NEURONTIN) 300 MG capsule Take 7 tablets by mouth once daily       . glatiramer (COPAXONE) 20 MG/ML injection Inject 20 mg into the skin daily.        . insulin aspart (NOVOLOG FLEXPEN) 100 UNIT/ML injection Sliding scale       . insulin glargine (LANTUS SOLOSTAR) 100 UNIT/ML injection Inject 47 Units into the skin 2 (two) times daily.  30 mL  12  . Insulin Pen Needle (BD ULTRA-FINE PEN NEEDLES) 29G X 12.7MM MISC Patient test blood sugar 3-4 times daily       . letrozole (FEMARA) 2.5 MG tablet Take  2.5 mg by mouth daily.        Marland Kitchen levothyroxine (SYNTHROID, LEVOTHROID) 175 MCG tablet Take 175 mcg by mouth daily.        . meloxicam (MOBIC) 15 MG tablet TAKE ONE TABLET BY MOUTH DAILY AS NEEDED FOR PAIN  30 tablet  0  . metFORMIN (GLUCOPHAGE) 1000 MG tablet TAKE ONE TABLET BY MOUTH TWICE DAILY  180 tablet  1  . nicotine (NICODERM CQ - DOSED IN MG/24 HOURS) 21 mg/24hr patch USE AS DIRECTED.  30 patch  2  . nystatin (MYCOSTATIN) powder Apply to affected area 3 times daily  30 g  0  . omeprazole (PRILOSEC) 20 MG capsule TAKE ONE CAPSULE BY MOUTH ONE TIME DAILY  30 capsule  11  . oxyCODONE-acetaminophen (PERCOCET) 5-325 MG per tablet NOT SURE OF DOSE       . potassium chloride (K-DUR) 10 MEQ tablet TAKE TWO TABLETS BY MOUTH DAILY  60 tablet  0  . tiotropium (SPIRIVA) 18 MCG inhalation capsule Place 18  mcg into inhaler and inhale daily as needed.        . traZODone (DESYREL) 100 MG tablet TAKE ONE-HALF TABLET BY MOUTH DAILY IN THE MORNING AND TAKE TWOTABLETS BY MOUTH AT BEDTIME  75 tablet  11    Allergies  Allergen Reactions  . Oxycodone-Acetaminophen     REACTION: unspecified  . Penicillins     REACTION: tongue and throat swelling tolerates cephalosporins  . Rosiglitazone Maleate     REACTION: swelling    Past Medical History  Diagnosis Date  . Depression   . Goiter, nodular     multi  . Dyslipidemia   . NIDDM, uncontrolled, with neuropathy   . Hypertension   . Allergy   . GERD (gastroesophageal reflux disease)   . Urinary incontinence   . Peripheral neuropathy   . Multiple sclerosis   . Breast cancer   . Hypothyroidism   . Asthma   . COPD (chronic obstructive pulmonary disease)   . DVT (deep venous thrombosis) 1990's    right leg    Past Surgical History  Procedure Date  . Thyroidectomy 09/2004  . Transthoracic echocardiogram 05/16/2004  . Partial hysterectomy 1975  . Axillary hidradenitis excision 1993    Excision biopsy growth right axilla, benign   . Other  surgical history     Thyroid biopsy 10/99  . Carpal tunnel release 03/2008    bilateral  . Mastectomy, radical 02/2009    left modified  . Tonsillectomy   . Appendectomy     No family history on file.  History   Social History  . Marital Status: Widowed    Spouse Name: N/A    Number of Children: 4  . Years of Education: N/A   Occupational History  . disabled, worked in Public librarian    Social History Main Topics  . Smoking status: Former Smoker    Types: Cigarettes    Quit date: 11/11/2003  . Smokeless tobacco: Never Used   Comment: did restart after husband's death but stopped again  . Alcohol Use: No  . Drug Use: Not on file  . Sexually Active: Not on file   Other Topics Concern  . Not on file   Social History Narrative   Widowed 1999 then 2nd Marriage--2000. Widowed again 2009    Review of Systems No nausea or vomiting Appetite is okay     Objective:   Physical Exam  Musculoskeletal:       Mild swelling and ecchymoses on lateral malleolus of left ankle No foot tenderness Mild lateral malleolar tenderness Decreased flexibility in ankle but passive ROM without sig pain          Assessment & Plan:

## 2011-08-26 NOTE — Assessment & Plan Note (Signed)
Xray report reviewed No acute fracture  Will have her continue cane and walking boot till pain better Soft tissue injury only (sprain)

## 2011-08-31 ENCOUNTER — Other Ambulatory Visit: Payer: Self-pay | Admitting: Internal Medicine

## 2011-09-02 ENCOUNTER — Ambulatory Visit: Payer: Self-pay | Admitting: Pain Medicine

## 2011-09-04 ENCOUNTER — Encounter: Payer: Self-pay | Admitting: Cardiothoracic Surgery

## 2011-09-04 ENCOUNTER — Encounter: Payer: Self-pay | Admitting: Nurse Practitioner

## 2011-09-10 ENCOUNTER — Ambulatory Visit: Payer: Self-pay | Admitting: Pain Medicine

## 2011-09-11 ENCOUNTER — Encounter: Payer: Self-pay | Admitting: Cardiothoracic Surgery

## 2011-09-11 ENCOUNTER — Encounter: Payer: Self-pay | Admitting: Nurse Practitioner

## 2011-09-11 ENCOUNTER — Ambulatory Visit: Payer: Self-pay | Admitting: Oncology

## 2011-09-13 ENCOUNTER — Other Ambulatory Visit: Payer: Self-pay | Admitting: Internal Medicine

## 2011-09-18 ENCOUNTER — Ambulatory Visit (INDEPENDENT_AMBULATORY_CARE_PROVIDER_SITE_OTHER): Payer: Medicare Other | Admitting: Internal Medicine

## 2011-09-18 ENCOUNTER — Encounter: Payer: Self-pay | Admitting: Internal Medicine

## 2011-09-18 ENCOUNTER — Emergency Department: Payer: Self-pay | Admitting: *Deleted

## 2011-09-18 DIAGNOSIS — S0093XA Contusion of unspecified part of head, initial encounter: Secondary | ICD-10-CM | POA: Insufficient documentation

## 2011-09-18 DIAGNOSIS — S0003XA Contusion of scalp, initial encounter: Secondary | ICD-10-CM

## 2011-09-18 DIAGNOSIS — S1093XA Contusion of unspecified part of neck, initial encounter: Secondary | ICD-10-CM

## 2011-09-18 NOTE — Progress Notes (Signed)
Subjective:    Patient ID: Madeline [name], female    DOB: [birth year], 60 y.o.   MRN: [id number]  HPI Larey Seat 5 days ago at home Hit back of head pretty bad Only just stopped hurting yesterday Just feels "all over bad"  Broke right great toe during fall--can tell from the bad bruising  Still in walking boot on left foot  Had to be seen at wound clinic for left foot wound  Didn't lose conciousness Has had some dizziness when walking Some headaches and left earache (not new though)  Current Outpatient Prescriptions on File Prior to Visit  Medication Sig Dispense Refill  . allopurinol (ZYLOPRIM) 100 MG tablet TAKE ONE TABLET BY MOUTH ONE TIME DAILY  60 tablet  2  . aspirin 325 MG tablet Take 325 mg by mouth daily.        . budesonide-formoterol (SYMBICORT) 160-4.5 MCG/ACT inhaler Inhale 2 puffs into the lungs 2 (two) times daily.        . Calcium Carbonate-Vitamin D (CALCIUM-VITAMIN D) 600-200 MG-UNIT CAPS Take by mouth daily.        . carvedilol (COREG) 25 MG tablet Take 25 mg by mouth 2 (two) times daily with a meal.        . cefUROXime (CEFTIN) 250 MG tablet Take 1 tablet (250 mg total) by mouth 2 (two) times daily.  20 tablet  0  . cyclobenzaprine (FLEXERIL) 10 MG tablet Take 10 mg by mouth 3 (three) times daily as needed.        . diazepam (VALIUM) 5 MG tablet Take 1/2 -1 tablet by mouth twice daily as needed for nerves  60 tablet  1  . fish oil-omega-3 fatty acids 1000 MG capsule Take 2 g by mouth daily.        Marland Kitchen FLUoxetine (PROZAC) 20 MG capsule TAKE ONE CAPSULE BY MOUTH ONE TIME DAILY FOR DEPRESSION  30 capsule  10  . furosemide (LASIX) 40 MG tablet Take 40 mg by mouth daily as needed. For foot swelling in the morning      . gabapentin (NEURONTIN) 300 MG capsule Take 7 tablets by mouth once daily       . glatiramer (COPAXONE) 20 MG/ML injection Inject 20 mg into the skin daily.        . insulin aspart (NOVOLOG FLEXPEN) 100 UNIT/ML injection Sliding scale       . insulin  glargine (LANTUS SOLOSTAR) 100 UNIT/ML injection Inject 47 Units into the skin 2 (two) times daily.  30 mL  12  . Insulin Pen Needle (BD ULTRA-FINE PEN NEEDLES) 29G X 12.7MM MISC Patient test blood sugar 3-4 times daily       . letrozole (FEMARA) 2.5 MG tablet Take 2.5 mg by mouth daily.        Marland Kitchen levothyroxine (SYNTHROID, LEVOTHROID) 175 MCG tablet Take 175 mcg by mouth daily.        . meloxicam (MOBIC) 15 MG tablet TAKE ONE TABLET BY MOUTH DAILY AS NEEDED FOR PAIN  30 tablet  1  . metFORMIN (GLUCOPHAGE) 1000 MG tablet TAKE ONE TABLET BY MOUTH TWICE DAILY  180 tablet  1  . nicotine (NICODERM CQ - DOSED IN MG/24 HOURS) 21 mg/24hr patch USE AS DIRECTED.  30 patch  2  . nystatin (MYCOSTATIN) powder Apply to affected area 3 times daily  30 g  0  . omeprazole (PRILOSEC) 20 MG capsule TAKE ONE CAPSULE BY MOUTH ONE TIME DAILY  30 capsule  11  .  oxyCODONE-acetaminophen (PERCOCET) 5-325 MG per tablet NOT SURE OF DOSE       . potassium chloride (K-DUR) 10 MEQ tablet TAKE TWO TABLETS BY MOUTH DAILY  60 tablet  11  . tiotropium (SPIRIVA) 18 MCG inhalation capsule Place 18 mcg into inhaler and inhale daily as needed.        . traZODone (DESYREL) 100 MG tablet TAKE ONE-HALF TABLET BY MOUTH DAILY IN THE MORNING AND TAKE TWOTABLETS BY MOUTH AT BEDTIME  75 tablet  11    Allergies  Allergen Reactions  . Penicillins     REACTION: tongue and throat swelling tolerates cephalosporins  . Rosiglitazone Maleate     REACTION: swelling    Past Medical History  Diagnosis Date  . Depression   . Goiter, nodular     multi  . Dyslipidemia   . NIDDM, uncontrolled, with neuropathy   . Hypertension   . Allergy   . GERD (gastroesophageal reflux disease)   . Urinary incontinence   . Peripheral neuropathy   . Multiple sclerosis   . Breast cancer   . Hypothyroidism   . Asthma   . COPD (chronic obstructive pulmonary disease)   . DVT (deep venous thrombosis) 1990's    right leg    Past Surgical History    Procedure Date  . Thyroidectomy 09/2004  . Transthoracic echocardiogram 05/16/2004  . Partial hysterectomy 1975  . Axillary hidradenitis excision 1993    Excision biopsy growth right axilla, benign   . Other surgical history     Thyroid biopsy 10/99  . Carpal tunnel release 03/2008    bilateral  . Mastectomy, radical 02/2009    left modified  . Tonsillectomy   . Appendectomy     No family history on file.  History   Social History  . Marital Status: Widowed    Spouse Name: N/A    Number of Children: 4  . Years of Education: N/A   Occupational History  . disabled, worked in Public librarian    Social History Main Topics  . Smoking status: Former Smoker    Types: Cigarettes    Quit date: 11/11/2003  . Smokeless tobacco: Never Used   Comment: did restart after husband's death but stopped again  . Alcohol Use: No  . Drug Use: Not on file  . Sexually Active: Not on file   Other Topics Concern  . Not on file   Social History Narrative   Widowed 1999 then 2nd Marriage--2000. Widowed again 2009   Review of Systems Generally eats okay Sleeps okay     Objective:   Physical Exam  Constitutional: No distress.       Alert   HENT:       Mild tenderness just over the occiput No apparent bruising  Neck: Normal range of motion.  Cardiovascular: Normal rate, regular rhythm and normal heart sounds.  Exam reveals no gallop.   No murmur heard. Pulmonary/Chest: Effort normal and breath sounds normal. No respiratory distress. She has no wheezes. She has no rales.  Lymphadenopathy:    She has no cervical adenopathy.          Assessment & Plan:

## 2011-09-18 NOTE — Assessment & Plan Note (Signed)
Seems to be mostly better Some dizziness which is not all new Will change her lasix to prn since BP is low

## 2011-10-07 ENCOUNTER — Ambulatory Visit: Payer: Self-pay | Admitting: Pain Medicine

## 2011-10-11 ENCOUNTER — Ambulatory Visit: Payer: Self-pay | Admitting: Oncology

## 2011-10-15 ENCOUNTER — Ambulatory Visit: Payer: Self-pay | Admitting: Pain Medicine

## 2011-10-16 ENCOUNTER — Other Ambulatory Visit: Payer: Self-pay | Admitting: Internal Medicine

## 2011-10-17 ENCOUNTER — Other Ambulatory Visit: Payer: Self-pay | Admitting: *Deleted

## 2011-10-17 MED ORDER — DIAZEPAM 5 MG PO TABS: ORAL_TABLET | ORAL | Status: DC

## 2011-10-17 NOTE — Telephone Encounter (Signed)
Last refill 09/18/2011, Rx in your IN box.

## 2011-10-17 NOTE — Telephone Encounter (Signed)
rx called into pharmacy .left message to have patient return my call.  

## 2011-10-17 NOTE — Telephone Encounter (Signed)
Okay #60 x 0 Let her know that with her mood better, she should not be taking these as regularly

## 2011-10-18 ENCOUNTER — Other Ambulatory Visit: Payer: Self-pay | Admitting: Cardiovascular Disease

## 2011-10-21 ENCOUNTER — Other Ambulatory Visit: Payer: Self-pay | Admitting: *Deleted

## 2011-10-21 MED ORDER — CARVEDILOL 25 MG PO TABS: 25.0000 mg | ORAL_TABLET | Freq: Two times a day (BID) | ORAL | Status: DC

## 2011-11-03 ENCOUNTER — Other Ambulatory Visit: Payer: Self-pay | Admitting: Internal Medicine

## 2011-11-13 ENCOUNTER — Ambulatory Visit: Payer: Self-pay | Admitting: Pain Medicine

## 2011-11-14 ENCOUNTER — Other Ambulatory Visit: Payer: Self-pay | Admitting: Internal Medicine

## 2011-11-18 ENCOUNTER — Other Ambulatory Visit: Payer: Self-pay | Admitting: *Deleted

## 2011-11-18 MED ORDER — LEVOTHYROXINE SODIUM 175 MCG PO TABS: 175.0000 ug | ORAL_TABLET | Freq: Every day | ORAL | Status: DC

## 2011-11-26 ENCOUNTER — Ambulatory Visit: Payer: Self-pay | Admitting: Pain Medicine

## 2011-12-09 ENCOUNTER — Ambulatory Visit: Payer: Self-pay | Admitting: Oncology

## 2011-12-11 ENCOUNTER — Ambulatory Visit: Payer: Self-pay | Admitting: Oncology

## 2011-12-11 ENCOUNTER — Ambulatory Visit: Payer: Self-pay | Admitting: Pain Medicine

## 2011-12-12 ENCOUNTER — Ambulatory Visit: Payer: Self-pay | Admitting: Oncology

## 2011-12-12 ENCOUNTER — Encounter: Payer: Self-pay | Admitting: Cardiothoracic Surgery

## 2011-12-12 ENCOUNTER — Encounter: Payer: Self-pay | Admitting: Nurse Practitioner

## 2011-12-17 LAB — WOUND CULTURE

## 2011-12-24 ENCOUNTER — Other Ambulatory Visit: Payer: Self-pay | Admitting: Internal Medicine

## 2012-01-07 ENCOUNTER — Telehealth: Payer: Self-pay | Admitting: Internal Medicine

## 2012-01-07 NOTE — Telephone Encounter (Signed)
Triage Record Num: [id number] Operator: Di Kindle Patient Name: Madeline Dash "[name]" [facility] Call Date & Time: 01/06/2012 4:46:04PM Patient Phone: [phone] PCP: Tillman Abide Patient Gender: Female PCP Fax : [phone] Patient DOB: November 05, 1951 Practice Name: [facility] Reason for Call: Caller: [place]/Patient is calling with a question about Diabetic Shoes.The medication was written by Tillman Abide I..States diabetic shoes were advised from diabetic wound clinic: [facility], last pair was three + years ago, states those were too big to wear. Please call [phone]. OFFICE Please. Protocol(s) Used: PCP Calls, No Triage (Adult) Recommended Outcome per Protocol: Call Provider Immediately Reason for Outcome: [1] Caller requests to speak ONLY to PCP AND [2] urgent question Care Advice: ~ 01/06/2012 4:50:45PM Page 1 of 1 [id number]

## 2012-01-07 NOTE — Telephone Encounter (Signed)
Spoke with patient and advised results   

## 2012-01-07 NOTE — Telephone Encounter (Signed)
If she picks a place to get them, they will usually send my a special prescription to fill out (and I will do this)

## 2012-01-09 ENCOUNTER — Encounter: Payer: Self-pay | Admitting: Nurse Practitioner

## 2012-01-09 ENCOUNTER — Ambulatory Visit: Payer: Self-pay | Admitting: Oncology

## 2012-01-09 ENCOUNTER — Encounter: Payer: Self-pay | Admitting: Cardiothoracic Surgery

## 2012-01-09 LAB — CANCER ANTIGEN 27.29: CA 27.29: 20.4 U/mL (ref 0.0–38.6)

## 2012-01-13 ENCOUNTER — Other Ambulatory Visit: Payer: Self-pay | Admitting: Internal Medicine

## 2012-01-22 ENCOUNTER — Ambulatory Visit: Payer: Self-pay | Admitting: Pain Medicine

## 2012-02-02 ENCOUNTER — Telehealth: Payer: Self-pay | Admitting: Internal Medicine

## 2012-02-02 NOTE — Telephone Encounter (Signed)
Can put in at 9:30 AM tomorrow

## 2012-02-02 NOTE — Telephone Encounter (Signed)
appt scheduled 02/03/12 @ 9:30am

## 2012-02-02 NOTE — Telephone Encounter (Signed)
[facility] Triage Call Report Triage Record Num: [id number] Operator: Lesli Albee Patient Name: Madeline [name] [facility] Call Date & Time: 02/02/2012 12:50:02PM Patient Phone: [phone] PCP: Tillman Abide Patient Gender: Female PCP Fax : [phone] Patient DOB: 1951/01/22 Practice Name: [facility] Reason for Call: Caller: [place]/Patient; PCP: Tillman Abide I.; CB#: [phone]; ; ; Call regarding Cough/Congestion THE PATIENT REFUSED 911; Pt has C.O.P.D. She started with cough/congestion on Friday 01/23/12. Pt is very congested and SOB. Rn triaged and advised UC or Ed. Office appts are full. Pt refused. She can only afford the co-pay at the office. She will call the office in the am for an appt. Protocol(s) Used: Breathing Problems Recommended Outcome per Protocol: See ED Immediately Reason for Outcome: New or worsening breathing problems that have not been evaluated Care Advice: Call EMS 911 if develop new onset or increasing confusion or lethargy; breathing problems continue to worsen or skin becomes bluish/gray; develops chest pain. ~ ~ IMMEDIATE ACTION May have clear liquids (such as water, clear fruit juices without pulp, soda, tea or coffee without dairy or non-dairy creamer, clear broth or bouillon, oral hydration solution, or plain gelatin, fruit ices/popsicles, hard candy) but do not eat solid foods before being seen by your provider. ~ ~ Place person in a position of comfort and loosen tight clothing. 03

## 2012-02-03 ENCOUNTER — Encounter: Payer: Self-pay | Admitting: Internal Medicine

## 2012-02-03 ENCOUNTER — Ambulatory Visit (INDEPENDENT_AMBULATORY_CARE_PROVIDER_SITE_OTHER): Payer: Medicare Other | Admitting: Internal Medicine

## 2012-02-03 VITALS — BP 122/70 | HR 83 | Temp 98.2°F | Wt 260.0 lb

## 2012-02-03 DIAGNOSIS — E1142 Type 2 diabetes mellitus with diabetic polyneuropathy: Secondary | ICD-10-CM

## 2012-02-03 DIAGNOSIS — J449 Chronic obstructive pulmonary disease, unspecified: Secondary | ICD-10-CM

## 2012-02-03 DIAGNOSIS — E1149 Type 2 diabetes mellitus with other diabetic neurological complication: Secondary | ICD-10-CM

## 2012-02-03 DIAGNOSIS — J4489 Other specified chronic obstructive pulmonary disease: Secondary | ICD-10-CM

## 2012-02-03 DIAGNOSIS — J209 Acute bronchitis, unspecified: Secondary | ICD-10-CM

## 2012-02-03 DIAGNOSIS — F329 Major depressive disorder, single episode, unspecified: Secondary | ICD-10-CM

## 2012-02-03 DIAGNOSIS — F3289 Other specified depressive episodes: Secondary | ICD-10-CM

## 2012-02-03 MED ORDER — CEFUROXIME AXETIL 500 MG PO TABS: 500.0000 mg | ORAL_TABLET | Freq: Two times a day (BID) | ORAL | Status: AC

## 2012-02-03 MED ORDER — PREDNISONE 20 MG PO TABS: 40.0000 mg | ORAL_TABLET | Freq: Every day | ORAL | Status: AC

## 2012-02-03 MED ORDER — OMEPRAZOLE 20 MG PO CPDR: 20.0000 mg | DELAYED_RELEASE_CAPSULE | Freq: Two times a day (BID) | ORAL | Status: DC

## 2012-02-03 NOTE — Progress Notes (Signed)
Subjective:    Patient ID: Madeline [name], female    DOB: 1951/06/04, 61 y.o.   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 allopurinol (ZYLOPRIM) 100 MG tablet TAKE ONE TABLET BY MOUTH ONE TIME DAILY  60 tablet  2  . aspirin 325 MG tablet Take 325 mg by mouth daily.        . budesonide-formoterol (SYMBICORT) 160-4.5 MCG/ACT inhaler Inhale 2 puffs into the lungs 2 (two) times daily.        . Calcium Carbonate-Vitamin D (CALCIUM-VITAMIN D) 600-200 MG-UNIT CAPS Take by mouth daily.        . carvedilol (COREG) 25 MG tablet Take 1 tablet (25 mg total) by mouth 2 (two) times daily with a meal.  180 tablet  0  . cyclobenzaprine (FLEXERIL) 10 MG tablet TAKE ONE TABLET BY MOUTH THREE TIMES DAILY AS NEEDED FOR MUSCLE SPASM  270 tablet  0  . diazepam (VALIUM) 5 MG tablet Take 1/2 -1 tablet by mouth twice daily as needed for nerves  60 tablet  0  . fish oil-omega-3 fatty acids 1000 MG capsule Take 2 g by mouth daily.        Marland Kitchen FLUoxetine (PROZAC) 20 MG capsule TAKE ONE CAPSULE BY MOUTH ONE TIME DAILY FOR DEPRESSION  30 capsule  10  . furosemide (LASIX) 40 MG tablet TAKE ONE AND ONE-HALF TABLETS BY MOUTH DAILY  135 tablet  0  . gabapentin (NEURONTIN) 300 MG capsule Take 7 tablets by mouth once daily       . glatiramer (COPAXONE) 20 MG/ML injection Inject 20 mg  into the skin daily.        . insulin aspart (NOVOLOG FLEXPEN) 100 UNIT/ML injection Sliding scale       . insulin glargine (LANTUS SOLOSTAR) 100 UNIT/ML injection Inject 47 Units into the skin 2 (two) times daily.  30 mL  12  . Insulin Pen Needle (BD ULTRA-FINE PEN NEEDLES) 29G X 12.7MM MISC Patient test blood sugar 3-4 times daily       . letrozole (FEMARA) 2.5 MG tablet Take 2.5 mg by mouth daily.        Marland Kitchen levothyroxine (SYNTHROID, LEVOTHROID) 175 MCG tablet Take 1 tablet (175 mcg total) by mouth daily.  90 tablet  3  . meloxicam (MOBIC) 15 MG tablet TAKE ONE TABLET BY MOUTH DAILY AS NEEDED FOR PAIN  30 tablet  0  . metFORMIN (GLUCOPHAGE) 1000 MG tablet TAKE ONE TABLET BY MOUTH TWICE DAILY  180 tablet  0  . nicotine (NICODERM CQ - DOSED IN MG/24 HOURS) 21 mg/24hr patch USE AS DIRECTED.  30 patch  2  . nystatin (MYCOSTATIN) powder Apply to affected area 3 times daily  30 g  0  . omeprazole (PRILOSEC) 20 MG capsule TAKE ONE CAPSULE BY MOUTH ONE  TIME DAILY  30 capsule  11  . oxyCODONE-acetaminophen (PERCOCET) 5-325 MG per tablet NOT SURE OF DOSE       . potassium chloride (K-DUR) 10 MEQ tablet TAKE TWO TABLETS BY MOUTH DAILY  60 tablet  11  . tiotropium (SPIRIVA) 18 MCG inhalation capsule Place 18 mcg into inhaler and inhale daily as needed.        . traZODone (DESYREL) 100 MG tablet TAKE ONE-HALF TABLET BY MOUTH DAILY IN THE MORNING AND TAKE TWOTABLETS BY MOUTH AT BEDTIME  75 tablet  11    Allergies  Allergen Reactions  . Penicillins     REACTION: tongue and throat swelling tolerates cephalosporins  . Rosiglitazone Maleate     REACTION: swelling    Past Medical History  Diagnosis Date  . Depression   . Goiter, nodular     multi  . Dyslipidemia   . NIDDM, uncontrolled, with neuropathy   . Hypertension   . Allergy   . GERD (gastroesophageal reflux disease)   . Urinary incontinence   . Peripheral neuropathy   . Multiple sclerosis   . Breast cancer   . Hypothyroidism   . Asthma    . COPD (chronic obstructive pulmonary disease)   . DVT (deep venous thrombosis) 1990's    right leg    Past Surgical History  Procedure Date  . Thyroidectomy 09/2004  . Transthoracic echocardiogram 05/16/2004  . Partial hysterectomy 1975  . Axillary hidradenitis excision 1993    Excision biopsy growth right axilla, benign   . Other surgical history     Thyroid biopsy 10/99  . Carpal tunnel release 03/2008    bilateral  . Mastectomy, radical 02/2009    left modified  . Tonsillectomy   . Appendectomy     No family history on file.  History   Social History  . Marital Status: Widowed    Spouse Name: N/A    Number of Children: 4  . Years of Education: N/A   Occupational History  . disabled, worked in Public librarian    Social History Main Topics  . Smoking status: Former Smoker    Types: Cigarettes    Quit date: 11/11/2003  . Smokeless tobacco: Never Used   Comment: did restart after husband's death but stopped again  . Alcohol Use: No  . Drug Use: Not on file  . Sexually Active: Not on file   Other Topics Concern  . Not on file   Social History Narrative   Widowed 1999 then 2nd Marriage--2000. Widowed again 2009   Review of Systems No vomiting or diarrhea Heartburn has been bad for 3 weeks or so---despite omeprazole    Objective:   Physical Exam  Constitutional: She appears well-developed. No distress.  Neck: Normal range of motion. Neck supple.  Cardiovascular: Normal rate, regular rhythm and normal heart sounds.  Exam reveals no gallop.   No murmur heard. Pulmonary/Chest: Effort normal. No respiratory distress. She has wheezes. She has no rales.       Slight inspiratory wheeze but not tight  Musculoskeletal: She exhibits no edema.  Lymphadenopathy:    She has no cervical adenopathy.  Psychiatric: She has a normal mood and affect. Her behavior is normal.          Assessment & Plan:

## 2012-02-03 NOTE — Progress Notes (Signed)
Addended by: Tillman Abide I on: 02/03/2012 10:55 AM   Modules accepted: Orders

## 2012-02-03 NOTE — Assessment & Plan Note (Signed)
Has been doing better Actually hopes to go back to work

## 2012-02-03 NOTE — Assessment & Plan Note (Signed)
Seems to have good control Ongoing wound care for left foot ulcer No labs since dispute with insurance and our lab---will do all next time

## 2012-02-03 NOTE — Assessment & Plan Note (Signed)
Mild exacerbation Will treat with brief course of prednisone

## 2012-02-03 NOTE — Assessment & Plan Note (Signed)
Seems to be bacterial Will treat with cefuroxime

## 2012-02-09 ENCOUNTER — Encounter: Payer: Self-pay | Admitting: Cardiothoracic Surgery

## 2012-02-09 ENCOUNTER — Encounter: Payer: Self-pay | Admitting: Nurse Practitioner

## 2012-02-13 ENCOUNTER — Telehealth: Payer: Self-pay

## 2012-02-13 ENCOUNTER — Other Ambulatory Visit: Payer: Self-pay | Admitting: Internal Medicine

## 2012-02-13 MED ORDER — FLUCONAZOLE 150 MG PO TABS: ORAL_TABLET | ORAL | Status: DC

## 2012-02-13 NOTE — Telephone Encounter (Signed)
Okay to send Rx for fluconazole 150mg #2 x 0 Take 1 now for yeast infection. Repeat in 1 week prn 

## 2012-02-13 NOTE — Telephone Encounter (Signed)
Pt finishing antibiotic and has vaginal and perineal itching and burning with brownish vaginal discharge.Pt request Diflucan sent to [facility]. Pt can be reached [phone].

## 2012-02-13 NOTE — Telephone Encounter (Signed)
rx sent to pharmacy by e-script Spoke with patient and advised results   

## 2012-02-14 ENCOUNTER — Other Ambulatory Visit: Payer: Self-pay | Admitting: Internal Medicine

## 2012-02-16 ENCOUNTER — Other Ambulatory Visit: Payer: Self-pay | Admitting: Internal Medicine

## 2012-02-18 ENCOUNTER — Ambulatory Visit: Payer: Self-pay | Admitting: Pain Medicine

## 2012-02-22 ENCOUNTER — Other Ambulatory Visit: Payer: Self-pay | Admitting: Internal Medicine

## 2012-02-24 ENCOUNTER — Ambulatory Visit: Payer: Self-pay | Admitting: Oncology

## 2012-03-02 ENCOUNTER — Telehealth: Payer: Self-pay

## 2012-03-02 MED ORDER — LEVOFLOXACIN 500 MG PO TABS: 500.0000 mg | ORAL_TABLET | Freq: Every day | ORAL | Status: DC

## 2012-03-02 NOTE — Telephone Encounter (Signed)
Pt last seen 02/03/12; pt finished ceftin but symptoms never cleared. Pt still has head and chest congestion, productive cough with green phlegm,wheezing, some SOB, ? Fever. Pt uses [facility] and pt can be reached at [phone].

## 2012-03-02 NOTE — Telephone Encounter (Signed)
rx sent to pharmacy by e-script  

## 2012-03-02 NOTE — Telephone Encounter (Signed)
Okay to change to levofloxacin 500mg  daily #10 x 0

## 2012-03-10 ENCOUNTER — Encounter: Payer: Self-pay | Admitting: Nurse Practitioner

## 2012-03-10 ENCOUNTER — Ambulatory Visit: Payer: Self-pay | Admitting: Oncology

## 2012-03-10 ENCOUNTER — Encounter: Payer: Self-pay | Admitting: Cardiothoracic Surgery

## 2012-03-12 ENCOUNTER — Telehealth: Payer: Self-pay

## 2012-03-12 NOTE — Telephone Encounter (Signed)
Pt said she left message with triage CAN on 03/09/12; pts one touch glucometer stopped working and pt request new glucometer; pt does not care what type meter it is but she is  familiar with the one touch meter. Pt said if have free meter at office that would be OK or call order for meter with strips to [facility]. Pt can be reached at [phone].

## 2012-03-12 NOTE — Telephone Encounter (Signed)
Please give her meter if we have one here---or send Rx

## 2012-03-15 NOTE — Telephone Encounter (Signed)
Spoke with patient and advised results, will leave meter up front for her to pick-up

## 2012-03-18 ENCOUNTER — Other Ambulatory Visit: Payer: Self-pay | Admitting: *Deleted

## 2012-03-18 ENCOUNTER — Ambulatory Visit: Payer: Self-pay | Admitting: Pain Medicine

## 2012-03-18 MED ORDER — CARVEDILOL 25 MG PO TABS: 25.0000 mg | ORAL_TABLET | Freq: Two times a day (BID) | ORAL | Status: DC

## 2012-03-18 MED ORDER — FUROSEMIDE 40 MG PO TABS: 60.0000 mg | ORAL_TABLET | Freq: Every day | ORAL | Status: DC

## 2012-03-26 ENCOUNTER — Telehealth: Payer: Self-pay

## 2012-03-26 MED ORDER — GLUCOSE BLOOD VI STRP: 1.0000 | ORAL_STRIP | Freq: Three times a day (TID) | Status: DC

## 2012-03-26 NOTE — Telephone Encounter (Signed)
Pt request new order for Contour easy test strips. Pt states she checks blood sugar three times daily. [facility] is pharmacy and pt can be reached at [phone].

## 2012-03-26 NOTE — Telephone Encounter (Signed)
rx sent to pharmacy by e-script  

## 2012-03-31 ENCOUNTER — Ambulatory Visit: Payer: Self-pay | Admitting: Pain Medicine

## 2012-04-06 ENCOUNTER — Other Ambulatory Visit: Payer: Self-pay | Admitting: *Deleted

## 2012-04-06 MED ORDER — INSULIN GLARGINE 100 UNIT/ML ~~LOC~~ SOLN: 47.0000 [IU] | Freq: Two times a day (BID) | SUBCUTANEOUS | Status: DC

## 2012-04-10 ENCOUNTER — Encounter: Payer: Self-pay | Admitting: Cardiothoracic Surgery

## 2012-04-10 ENCOUNTER — Encounter: Payer: Self-pay | Admitting: Nurse Practitioner

## 2012-04-16 ENCOUNTER — Ambulatory Visit: Payer: Self-pay | Admitting: Oncology

## 2012-04-20 ENCOUNTER — Ambulatory Visit: Payer: Self-pay | Admitting: Pain Medicine

## 2012-04-20 ENCOUNTER — Telehealth: Payer: Self-pay

## 2012-04-20 NOTE — Telephone Encounter (Signed)
Pt going to wound care center; needs rx for diabetic shoes and inserts. Pt said [facility] will contact Dr Alphonsus Sias after getting initial rx for shoes and inserts. Call when rx ready for pick up.

## 2012-04-20 NOTE — Telephone Encounter (Signed)
Left message that rx for shoes is ready for pick up and it will be at the front desk.

## 2012-04-20 NOTE — Telephone Encounter (Signed)
Rx written.

## 2012-05-03 ENCOUNTER — Ambulatory Visit: Payer: Self-pay | Admitting: Pain Medicine

## 2012-05-07 LAB — BASIC METABOLIC PANEL
Anion Gap: 11 (ref 7–16)
Calcium, Total: 8.4 mg/dL — ABNORMAL LOW (ref 8.5–10.1)
Chloride: 104 mmol/L (ref 98–107)
Creatinine: 0.86 mg/dL (ref 0.60–1.30)
EGFR (African American): >60
Glucose: 215 mg/dL — ABNORMAL HIGH (ref 65–99)
Osmolality: 293 (ref 275–301)
Potassium: 3.8 mmol/L (ref 3.5–5.1)

## 2012-05-10 ENCOUNTER — Ambulatory Visit: Payer: Self-pay | Admitting: Oncology

## 2012-05-11 ENCOUNTER — Other Ambulatory Visit: Payer: Self-pay | Admitting: *Deleted

## 2012-05-11 MED ORDER — METFORMIN HCL 1000 MG PO TABS: 1000.0000 mg | ORAL_TABLET | Freq: Two times a day (BID) | ORAL | Status: DC

## 2012-05-20 ENCOUNTER — Ambulatory Visit: Payer: Self-pay | Admitting: Pain Medicine

## 2012-05-20 ENCOUNTER — Other Ambulatory Visit: Payer: Self-pay | Admitting: *Deleted

## 2012-05-20 MED ORDER — DIAZEPAM 5 MG PO TABS: ORAL_TABLET | ORAL | Status: DC

## 2012-05-20 MED ORDER — CYCLOBENZAPRINE HCL 10 MG PO TABS: 10.0000 mg | ORAL_TABLET | Freq: Three times a day (TID) | ORAL | Status: DC | PRN

## 2012-05-20 NOTE — Telephone Encounter (Signed)
rx called into pharmacy

## 2012-05-20 NOTE — Telephone Encounter (Signed)
Okay cyclobenzaprine #270 x 0 Diazepam #60 x 0

## 2012-06-02 ENCOUNTER — Ambulatory Visit: Payer: Self-pay | Admitting: Pain Medicine

## 2012-06-10 ENCOUNTER — Ambulatory Visit: Payer: Self-pay | Admitting: Oncology

## 2012-06-17 ENCOUNTER — Ambulatory Visit: Payer: Self-pay | Admitting: Pain Medicine

## 2012-06-18 ENCOUNTER — Other Ambulatory Visit: Payer: Self-pay | Admitting: *Deleted

## 2012-06-18 MED ORDER — METFORMIN HCL 1000 MG PO TABS: 1000.0000 mg | ORAL_TABLET | Freq: Two times a day (BID) | ORAL | Status: DC

## 2012-06-18 MED ORDER — ALLOPURINOL 100 MG PO TABS: 100.0000 mg | ORAL_TABLET | Freq: Every day | ORAL | Status: DC

## 2012-06-18 MED ORDER — FUROSEMIDE 40 MG PO TABS: 60.0000 mg | ORAL_TABLET | Freq: Every day | ORAL | Status: DC

## 2012-06-25 ENCOUNTER — Emergency Department: Payer: Self-pay | Admitting: Emergency Medicine

## 2012-06-25 LAB — COMPREHENSIVE METABOLIC PANEL
Albumin: 2.8 g/dL — ABNORMAL LOW (ref 3.4–5.0)
Alkaline Phosphatase: 78 U/L (ref 50–136)
BUN: 13 mg/dL (ref 7–18)
Bilirubin,Total: 0.3 mg/dL (ref 0.2–1.0)
Calcium, Total: 8.7 mg/dL (ref 8.5–10.1)
Glucose: 294 mg/dL — ABNORMAL HIGH (ref 65–99)
Osmolality: 289 (ref 275–301)
Potassium: 4.3 mmol/L (ref 3.5–5.1)
SGOT(AST): 19 U/L (ref 15–37)
Total Protein: 7 g/dL (ref 6.4–8.2)

## 2012-06-25 LAB — CBC
HCT: 37.4 % (ref 35.0–47.0)
MCHC: 32.4 g/dL (ref 32.0–36.0)
Platelet: 203 10*3/uL (ref 150–440)
RDW: 14.3 % (ref 11.5–14.5)

## 2012-06-28 ENCOUNTER — Telehealth: Payer: Self-pay | Admitting: Internal Medicine

## 2012-06-28 NOTE — Telephone Encounter (Signed)
Caller: [place]/Patient; Patient Name: Madeline [name]; PCP: Tillman Abide; Best Callback Phone Number: [phone]; Call Regarding Left Leg Swelling worsen, onset 8-19.  Swelling initially started on 8-15 with heavier breathing, COPD, Patient was seen in Emergency Room on 8-16, diagnosed with Cellulitis, antibiotics started. Patients states breathing has not worsen since seen in Emergency Room. Patient also has had tingling and numbness in Hand and Leg since 8-19, Patient mentioned to MD on 8-16, denies worsening of breathing or tingling/numbnesssince evaluation. FSBS 157.   Patient is refusing office visit and Emergency Room due to finacial hard ship on 8-19.  Patient requesting appointment on 8-22 and will borrow money from Daughter.  Appointment made on 8-22 at 2:45 with Dr Alphonsus Sias per Patient's request.  Advised to call back if breathing worsen or Chest pain developed.  Patient verbalized understanding.

## 2012-07-01 ENCOUNTER — Ambulatory Visit: Payer: Medicare Other | Admitting: Internal Medicine

## 2012-07-05 ENCOUNTER — Encounter: Payer: Self-pay | Admitting: Nurse Practitioner

## 2012-07-05 ENCOUNTER — Encounter: Payer: Self-pay | Admitting: Cardiothoracic Surgery

## 2012-07-06 ENCOUNTER — Ambulatory Visit: Payer: Self-pay | Admitting: Pain Medicine

## 2012-07-11 ENCOUNTER — Encounter: Payer: Self-pay | Admitting: Nurse Practitioner

## 2012-07-11 ENCOUNTER — Encounter: Payer: Self-pay | Admitting: Cardiothoracic Surgery

## 2012-07-11 ENCOUNTER — Ambulatory Visit: Payer: Self-pay | Admitting: Oncology

## 2012-07-15 ENCOUNTER — Other Ambulatory Visit: Payer: Self-pay | Admitting: *Deleted

## 2012-07-15 MED ORDER — TRAZODONE HCL 100 MG PO TABS: ORAL_TABLET | ORAL | Status: DC

## 2012-07-17 ENCOUNTER — Emergency Department: Payer: Self-pay | Admitting: Emergency Medicine

## 2012-07-17 LAB — URINALYSIS, COMPLETE
Bilirubin,UR: NEGATIVE
Blood: NEGATIVE
Glucose,UR: NEGATIVE mg/dL (ref 0–75)
Ketone: NEGATIVE
Nitrite: NEGATIVE
Ph: 5 (ref 4.5–8.0)
Protein: NEGATIVE
RBC,UR: 2 /HPF (ref 0–5)
Squamous Epithelial: 4
WBC UR: 11 /HPF (ref 0–5)

## 2012-07-19 ENCOUNTER — Ambulatory Visit: Payer: Self-pay | Admitting: Pain Medicine

## 2012-07-23 ENCOUNTER — Ambulatory Visit: Payer: Medicare Other | Admitting: Internal Medicine

## 2012-08-10 ENCOUNTER — Encounter: Payer: Self-pay | Admitting: Nurse Practitioner

## 2012-08-10 ENCOUNTER — Encounter: Payer: Self-pay | Admitting: Cardiothoracic Surgery

## 2012-08-12 ENCOUNTER — Telehealth: Payer: Self-pay | Admitting: Internal Medicine

## 2012-08-12 MED ORDER — POTASSIUM CHLORIDE ER 10 MEQ PO TBCR: 20.0000 meq | EXTENDED_RELEASE_TABLET | Freq: Every day | ORAL | Status: DC

## 2012-08-12 NOTE — Telephone Encounter (Signed)
Refill- prozac 20mg  cap. Take one capsule by mouth one time daily for depression. Qty 30 last fill 9.10.13

## 2012-08-12 NOTE — Telephone Encounter (Signed)
Refill- klor con tab. Take two tablets by mouth daily. Qty 60 last fill 9.6.13

## 2012-08-12 NOTE — Telephone Encounter (Signed)
Medication sent to pharmacy for 1 fill.  Pt has appt on 08/17/12.

## 2012-08-16 ENCOUNTER — Other Ambulatory Visit: Payer: Self-pay | Admitting: *Deleted

## 2012-08-16 MED ORDER — MELOXICAM 15 MG PO TABS: 15.0000 mg | ORAL_TABLET | Freq: Every day | ORAL | Status: DC

## 2012-08-17 ENCOUNTER — Encounter: Payer: Medicare Other | Admitting: Internal Medicine

## 2012-08-19 ENCOUNTER — Ambulatory Visit: Payer: Self-pay | Admitting: Pain Medicine

## 2012-08-23 ENCOUNTER — Other Ambulatory Visit: Payer: Self-pay | Admitting: *Deleted

## 2012-08-23 MED ORDER — FLUOXETINE HCL 20 MG PO CAPS: 20.0000 mg | ORAL_CAPSULE | Freq: Every day | ORAL | Status: DC

## 2012-08-25 ENCOUNTER — Ambulatory Visit: Payer: Self-pay | Admitting: Oncology

## 2012-08-27 ENCOUNTER — Ambulatory Visit (INDEPENDENT_AMBULATORY_CARE_PROVIDER_SITE_OTHER): Payer: Medicare Other | Admitting: Internal Medicine

## 2012-08-27 ENCOUNTER — Encounter: Payer: Self-pay | Admitting: Internal Medicine

## 2012-08-27 ENCOUNTER — Encounter: Payer: Medicare Other | Admitting: Internal Medicine

## 2012-08-27 VITALS — BP 120/70 | HR 76 | Temp 98.3°F | Wt 257.0 lb

## 2012-08-27 DIAGNOSIS — Z79899 Other long term (current) drug therapy: Secondary | ICD-10-CM

## 2012-08-27 DIAGNOSIS — E1142 Type 2 diabetes mellitus with diabetic polyneuropathy: Secondary | ICD-10-CM

## 2012-08-27 DIAGNOSIS — J449 Chronic obstructive pulmonary disease, unspecified: Secondary | ICD-10-CM

## 2012-08-27 DIAGNOSIS — E1149 Type 2 diabetes mellitus with other diabetic neurological complication: Secondary | ICD-10-CM

## 2012-08-27 DIAGNOSIS — E039 Hypothyroidism, unspecified: Secondary | ICD-10-CM

## 2012-08-27 DIAGNOSIS — E785 Hyperlipidemia, unspecified: Secondary | ICD-10-CM

## 2012-08-27 DIAGNOSIS — I10 Essential (primary) hypertension: Secondary | ICD-10-CM

## 2012-08-27 DIAGNOSIS — Z23 Encounter for immunization: Secondary | ICD-10-CM

## 2012-08-27 DIAGNOSIS — G35 Multiple sclerosis: Secondary | ICD-10-CM

## 2012-08-27 LAB — CBC WITH DIFFERENTIAL/PLATELET
Basophils Relative: 0.7 % (ref 0.0–3.0)
HCT: 40.3 % (ref 36.0–46.0)
Hemoglobin: 12.8 g/dL (ref 12.0–15.0)
Lymphocytes Relative: 11.9 % — ABNORMAL LOW (ref 12.0–46.0)
Lymphs Abs: 1.4 10*3/uL (ref 0.7–4.0)
Monocytes Relative: 8.9 % (ref 3.0–12.0)
Neutro Abs: 8.9 10*3/uL — ABNORMAL HIGH (ref 1.4–7.7)
RBC: 4.65 Mil/uL (ref 3.87–5.11)
RDW: 15.9 % — ABNORMAL HIGH (ref 11.5–14.6)

## 2012-08-27 LAB — BASIC METABOLIC PANEL
CO2: 28 mEq/L (ref 19–32)
Calcium: 9 mg/dL (ref 8.4–10.5)
GFR: 107.07 mL/min (ref >60.00)
Glucose, Bld: 94 mg/dL (ref 70–99)
Potassium: 4.4 mEq/L (ref 3.5–5.1)
Sodium: 142 mEq/L (ref 135–145)

## 2012-08-27 LAB — LDL CHOLESTEROL, DIRECT: Direct LDL: 102.4 mg/dL

## 2012-08-27 LAB — HEPATIC FUNCTION PANEL
ALT: 9 U/L (ref 0–35)
AST: 17 U/L (ref 0–37)
Albumin: 3.5 g/dL (ref 3.5–5.2)
Alkaline Phosphatase: 89 U/L (ref 39–117)
Bilirubin, Direct: 0 mg/dL (ref 0.0–0.3)
Total Protein: 7.1 g/dL (ref 6.0–8.3)

## 2012-08-27 LAB — HEMOGLOBIN A1C: Hgb A1c MFr Bld: 7 % — ABNORMAL HIGH (ref 4.6–6.5)

## 2012-08-27 LAB — LIPID PANEL: HDL: 37.1 mg/dL — ABNORMAL LOW (ref >39.00)

## 2012-08-27 LAB — T4, FREE: Free T4: 1.04 ng/dL (ref 0.60–1.60)

## 2012-08-27 NOTE — Assessment & Plan Note (Signed)
Mild symptoms still but more functional Still has fatigue
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
## 2012-08-27 NOTE — Assessment & Plan Note (Signed)
BP Readings from Last 3 Encounters:  08/27/12 120/70  02/03/12 122/70  09/18/11 90/66   Good control No changes

## 2012-08-27 NOTE — Assessment & Plan Note (Signed)
Controlled symptoms Still limited exercise tolerance Only occ uses rescue inhaler

## 2012-08-27 NOTE — Assessment & Plan Note (Signed)
Seems to have good control Due for labs 

## 2012-08-27 NOTE — Addendum Note (Signed)
Addended by: Sueanne Margarita on: 08/27/2012 11:20 AM   Modules accepted: Orders

## 2012-08-30 ENCOUNTER — Encounter: Payer: Self-pay | Admitting: *Deleted

## 2012-09-10 ENCOUNTER — Ambulatory Visit: Payer: Self-pay | Admitting: Oncology

## 2012-09-13 ENCOUNTER — Ambulatory Visit: Payer: Self-pay | Admitting: Pain Medicine

## 2012-09-14 ENCOUNTER — Other Ambulatory Visit: Payer: Self-pay | Admitting: *Deleted

## 2012-09-14 MED ORDER — LEVOTHYROXINE SODIUM 175 MCG PO TABS: 175.0000 ug | ORAL_TABLET | Freq: Every day | ORAL | Status: DC

## 2012-09-14 MED ORDER — POTASSIUM CHLORIDE ER 10 MEQ PO TBCR: 20.0000 meq | EXTENDED_RELEASE_TABLET | Freq: Every day | ORAL | Status: DC

## 2012-09-14 MED ORDER — CYCLOBENZAPRINE HCL 10 MG PO TABS: 10.0000 mg | ORAL_TABLET | Freq: Three times a day (TID) | ORAL | Status: DC | PRN

## 2012-09-14 MED ORDER — CARVEDILOL 25 MG PO TABS: 25.0000 mg | ORAL_TABLET | Freq: Two times a day (BID) | ORAL | Status: DC

## 2012-09-14 MED ORDER — DIAZEPAM 5 MG PO TABS: ORAL_TABLET | ORAL | Status: DC

## 2012-09-14 NOTE — Telephone Encounter (Signed)
rx sent to pharmacy by e-script  

## 2012-09-14 NOTE — Telephone Encounter (Signed)
Last filled 05/20/2012

## 2012-09-14 NOTE — Telephone Encounter (Signed)
Okay #60 x 0 

## 2012-09-14 NOTE — Telephone Encounter (Signed)
rx called into pharmacy

## 2012-09-27 ENCOUNTER — Encounter: Payer: Self-pay | Admitting: Internal Medicine

## 2012-09-27 ENCOUNTER — Ambulatory Visit (INDEPENDENT_AMBULATORY_CARE_PROVIDER_SITE_OTHER): Payer: Medicare Other | Admitting: Internal Medicine

## 2012-09-27 VITALS — BP 116/70 | HR 83 | Temp 98.2°F | Ht 63.0 in | Wt 256.2 lb

## 2012-09-27 DIAGNOSIS — Z0000 Encounter for general adult medical examination without abnormal findings: Secondary | ICD-10-CM | POA: Insufficient documentation

## 2012-09-27 DIAGNOSIS — E1142 Type 2 diabetes mellitus with diabetic polyneuropathy: Secondary | ICD-10-CM

## 2012-09-27 DIAGNOSIS — E1149 Type 2 diabetes mellitus with other diabetic neurological complication: Secondary | ICD-10-CM

## 2012-09-27 NOTE — Assessment & Plan Note (Signed)
Lab Results  Component Value Date   HGBA1C 7.0* 08/27/2012   Good control No changes needed
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
## 2012-09-27 NOTE — Assessment & Plan Note (Addendum)
No sig pain so no Rx needed Does see Dr Metta Clines for chronic back pain  Does have decreased sensation and preulcerative callous Diabetic shoes should reduce her risk of ulceration and should be used

## 2012-09-28 ENCOUNTER — Encounter: Payer: Medicare Other | Admitting: Internal Medicine

## 2012-09-30 ENCOUNTER — Other Ambulatory Visit: Payer: Self-pay

## 2012-09-30 MED ORDER — FLUOXETINE HCL 20 MG PO CAPS: 20.0000 mg | ORAL_CAPSULE | Freq: Every day | ORAL | Status: DC

## 2012-09-30 NOTE — Telephone Encounter (Signed)
Pt was at [facility] to pick up Fluoxetine 20 mg. Spoke with Victorino Dike at [employer] # [phone] x 11. Pt notified refilled.

## 2012-10-12 ENCOUNTER — Ambulatory Visit: Payer: Self-pay | Admitting: Oncology

## 2012-10-12 ENCOUNTER — Ambulatory Visit: Payer: Self-pay | Admitting: Pain Medicine

## 2012-10-20 ENCOUNTER — Ambulatory Visit: Payer: Self-pay | Admitting: Pain Medicine

## 2012-10-29 ENCOUNTER — Telehealth: Payer: Self-pay | Admitting: Internal Medicine

## 2012-10-29 ENCOUNTER — Other Ambulatory Visit: Payer: Self-pay | Admitting: Internal Medicine

## 2012-10-29 NOTE — Telephone Encounter (Signed)
Spoke with patient and advised results, she understands and will call if appt is needed.

## 2012-10-29 NOTE — Telephone Encounter (Signed)
Patient Information:  Caller Name: Taejah  Phone: [phone]  Patient: Madeline [name]  Gender: Female  DOB: Apr 27, 1951  Age: 61 Years  PCP: Tillman Abide [facility])  Office Follow Up:  Does the office need to follow up with this patient?: Yes  Instructions For The Office: Wants antibiotic instead of office visit  RN Note:  FBS 135.  Productive cough with green phlem.  Noted wheezing 10/29/12 that "nearly" resoved with use of MDI. Informed must be seen for antibiotics per MD protocol.  Patient did not want appointment, lives in [place], and call disconnected; RN called back; left message that message will be sent to MD.  Symptoms  Reason For Call & Symptoms: Called regarding "bronchitis" with productive cough. Requesting antibiotc and Diflucan to prevent yeast infection.  Hx COPD.  Reviewed Health History In EMR: Yes  Reviewed Medications In EMR: Yes  Reviewed Allergies In EMR: Yes  Reviewed Surgeries / Procedures: Yes  Date of Onset of Symptoms: 10/27/2012  Treatments Tried: MDI  Treatments Tried Worked: Yes  Guideline(s) Used:  Cough  Disposition Per Guideline:   Go to Office Now  Reason For Disposition Reached:   Wheezing is present  Advice Given:  N/A  RN Overrode Recommendation:  Patient Requests Prescription  Please call back;  requests RX instead of appointment, despite MD orders

## 2012-10-29 NOTE — Telephone Encounter (Signed)
We really don't just send orders for antibiotics without an evaluation Can schedule at Saturday clinic in [place] if her symptoms persist tomorrow  If not severe, even with COPD most bronchitis starts as virus---so wouldn't necessarily treat in the first few days unless the lung problems are much worse (in which case visit is really needed)

## 2012-11-10 ENCOUNTER — Ambulatory Visit: Payer: Self-pay | Admitting: Oncology

## 2012-11-16 ENCOUNTER — Ambulatory Visit: Payer: Self-pay | Admitting: Pain Medicine

## 2012-11-24 ENCOUNTER — Ambulatory Visit: Payer: Self-pay | Admitting: Pain Medicine

## 2012-11-25 ENCOUNTER — Telehealth: Payer: Self-pay | Admitting: *Deleted

## 2012-11-25 ENCOUNTER — Other Ambulatory Visit: Payer: Self-pay | Admitting: Internal Medicine

## 2012-11-25 NOTE — Telephone Encounter (Signed)
Try the 2mg  1-2 tid prn #180 x 0

## 2012-11-25 NOTE — Telephone Encounter (Signed)
Called patient because her insurance will no longer cover CYCLOBENZAPRINE 10 mg and Dr.Letvak wanted her switched to TIZANIDINE, per patient she is ok with this change. New rx will be sent to [facility].  Dr.Letvak what dose should the TIZANIDINE be? 2 mg or 4 mg and would the instructions be the same? 1 tab three times daily?

## 2012-11-26 MED ORDER — TIZANIDINE HCL 2 MG PO TABS: ORAL_TABLET | ORAL | Status: DC

## 2012-11-26 NOTE — Telephone Encounter (Signed)
[facility], patient advised.

## 2012-12-15 ENCOUNTER — Other Ambulatory Visit: Payer: Self-pay | Admitting: Neurology

## 2012-12-15 ENCOUNTER — Ambulatory Visit: Payer: Self-pay | Admitting: Oncology

## 2012-12-15 DIAGNOSIS — R269 Unspecified abnormalities of gait and mobility: Secondary | ICD-10-CM

## 2012-12-15 DIAGNOSIS — G35 Multiple sclerosis: Secondary | ICD-10-CM

## 2012-12-16 ENCOUNTER — Ambulatory Visit: Payer: Self-pay | Admitting: Pain Medicine

## 2012-12-22 ENCOUNTER — Other Ambulatory Visit: Payer: Medicare Other

## 2012-12-27 ENCOUNTER — Ambulatory Visit: Payer: Self-pay | Admitting: Pain Medicine

## 2012-12-28 ENCOUNTER — Ambulatory Visit: Payer: Self-pay | Admitting: Oncology

## 2012-12-29 ENCOUNTER — Encounter: Payer: Self-pay | Admitting: Family Medicine

## 2012-12-29 ENCOUNTER — Ambulatory Visit (INDEPENDENT_AMBULATORY_CARE_PROVIDER_SITE_OTHER): Payer: Medicare Other | Admitting: Family Medicine

## 2012-12-29 VITALS — BP 142/84 | HR 76 | Temp 98.5°F | Wt 257.2 lb

## 2012-12-29 DIAGNOSIS — J019 Acute sinusitis, unspecified: Secondary | ICD-10-CM

## 2012-12-29 MED ORDER — FLUCONAZOLE 150 MG PO TABS: 150.0000 mg | ORAL_TABLET | Freq: Once | ORAL | Status: DC

## 2012-12-29 MED ORDER — DOXYCYCLINE HYCLATE 100 MG PO CAPS: 100.0000 mg | ORAL_CAPSULE | Freq: Two times a day (BID) | ORAL | Status: DC

## 2012-12-29 NOTE — Progress Notes (Signed)
  Subjective:    Patient ID: Madeline [name], female    DOB: September 17, 1951, 62 y.o.   MRN: [id number]  HPI CC: URI sxs for months  Pleasant 62 yo pt of Dr. Karle Starch new to me with h/o OA, MS, breast cancer on femara, HTN, T2DM, depression, HLD, COPD and hypothyroid presents with 2 month history of rhinorrhea especially at night, head and sinus pressure, cough with chest tightness "like muscle contraction" that comes and goes not exertional, worse at night.  Cough productive of green/yellow sputum.  + PNdrainage.  Head and chest congestion.  + chills.  Intermittent for last 2 months.  This week had some nausea as well.  Some wheezing and dyspnea.  Not currently using symbicort.  Currently not using proair "not bad enough to use it".  Currently not using symbicort.  No fevers, tooth pain, abd pain.  Has not taken any meds for this.  No smokers at home. Smoker - 1/2 ppd H/o COPD. Did receive flu shot in October. Did not take bp meds this morning.  Past Medical History  Diagnosis Date  . Depression   . Goiter, nodular     multi  . Dyslipidemia   . NIDDM, uncontrolled, with neuropathy   . Hypertension   . Allergy   . GERD (gastroesophageal reflux disease)   . Urinary incontinence   . Peripheral neuropathy   . Multiple sclerosis   . Breast cancer   . Hypothyroidism   . Asthma   . COPD (chronic obstructive pulmonary disease)   . DVT (deep venous thrombosis) 1990's    right leg    Review of Systems Per HPI    Objective:   Physical Exam  Nursing note and vitals reviewed. Constitutional: She appears well-developed and well-nourished. No distress.  HENT:  Head: Normocephalic and atraumatic.  Right Ear: Hearing, tympanic membrane, external ear and ear canal normal.  Left Ear: Hearing, tympanic membrane, external ear and ear canal normal.  Nose: Mucosal edema present. No rhinorrhea. Right sinus exhibits maxillary sinus tenderness. Right sinus exhibits no frontal sinus tenderness.  Left sinus exhibits maxillary sinus tenderness. Left sinus exhibits no frontal sinus tenderness.  Mouth/Throat: Uvula is midline, oropharynx is clear and moist and mucous membranes are normal. No oropharyngeal exudate, posterior oropharyngeal edema, posterior oropharyngeal erythema or tonsillar abscesses.  Nasal mucosal erythema and edema Bilateral frontal and maxillary sinus pressure and mild tenderness to palpation  Eyes: Conjunctivae and EOM are normal. Pupils are equal, round, and reactive to light. No scleral icterus.  Neck: Normal range of motion. Neck supple.  Cardiovascular: Normal rate, regular rhythm, normal heart sounds and intact distal pulses.   No murmur heard. Pulmonary/Chest: Effort normal and breath sounds normal. No respiratory distress. She has no wheezes. She has no rales.  Musculoskeletal: She exhibits no edema.  Lymphadenopathy:    She has no cervical adenopathy.  Skin: Skin is warm and dry. No rash noted.      Assessment & Plan:

## 2012-12-29 NOTE — Patient Instructions (Addendum)
I do think you have sinus infection -treat with doxycycline course. diflucan for after. Take simple mucinex or immediate release guaifenesin with plenty of fluid to help mobilize mucous. Push fluids and plenty of rest   I don't think chest disomfort is coming from heart but rather from current infection. Let us know if not improving as expected, or if fever >101, or worsening productive cough. Call us with questions. I hope you start feeling better.

## 2012-12-29 NOTE — Assessment & Plan Note (Signed)
Anticipate acute sinusitis given duration - treat with doxy course. Pt states gets yeast infections with doxy so provided with diflucan. Red flags to return discussed. Nonexertional chest discomfort anticipate attributable to bronchial irritation.

## 2012-12-30 ENCOUNTER — Other Ambulatory Visit: Payer: Self-pay | Admitting: Internal Medicine

## 2013-01-04 ENCOUNTER — Ambulatory Visit
Admission: RE | Admit: 2013-01-04 | Discharge: 2013-01-04 | Disposition: A | Discharge status: Home / Self Care | Payer: Medicare Other | Source: Ambulatory Visit | Attending: Neurology | Admitting: Neurology

## 2013-01-04 DIAGNOSIS — R269 Unspecified abnormalities of gait and mobility: Secondary | ICD-10-CM

## 2013-01-08 ENCOUNTER — Ambulatory Visit: Payer: Self-pay | Admitting: Oncology

## 2013-01-13 ENCOUNTER — Ambulatory Visit: Payer: Self-pay | Admitting: Pain Medicine

## 2013-01-19 ENCOUNTER — Telehealth: Payer: Self-pay

## 2013-01-19 ENCOUNTER — Other Ambulatory Visit: Payer: Self-pay | Admitting: *Deleted

## 2013-01-19 MED ORDER — LIDOCAINE 5 % EX PTCH: 1.0000 | MEDICATED_PATCH | Freq: Two times a day (BID) | CUTANEOUS | Status: DC

## 2013-01-19 MED ORDER — METFORMIN HCL 1000 MG PO TABS: 1000.0000 mg | ORAL_TABLET | Freq: Two times a day (BID) | ORAL | Status: DC

## 2013-01-19 NOTE — Telephone Encounter (Signed)
Pt was in [place] due to weather and was seen at [facility] ED for asthma and COPD. Dr in ER gave pt Prednisone and Z pack and ordered oxygen with [facility]. [facility] contacted pt and needs order from PCP for O2. Pt said she cannot afford to go to pulmonologist and wants Dr Alphonsus Sias to order. Spoke with Asher Muir at [facility] [phone]; pt needs to be seen and needs order, diagnosis, sat levels at rest and moving, office notes supporting diagnosis faxed to [phone]. Asher Muir said would have to receive by 01/26/13. Pt said she is doing better since being on O2 but still has SOB with exertion. Pt scheduled appt with Dr Alphonsus Sias 01/24/13 and advised if condition changes or worsens prior to appt for pt to call our office back.pt voiced understanding.

## 2013-01-20 ENCOUNTER — Other Ambulatory Visit: Payer: Self-pay | Admitting: *Deleted

## 2013-01-20 MED ORDER — FUROSEMIDE 40 MG PO TABS: ORAL_TABLET | ORAL | Status: DC

## 2013-01-22 NOTE — Telephone Encounter (Signed)
We can work on the certification at that time May be an issue if her sats are now okay but will review with her then

## 2013-01-24 ENCOUNTER — Ambulatory Visit: Payer: Medicare Other | Admitting: Internal Medicine

## 2013-01-24 ENCOUNTER — Telehealth: Payer: Self-pay | Admitting: Internal Medicine

## 2013-01-27 ENCOUNTER — Encounter: Payer: Self-pay | Admitting: Internal Medicine

## 2013-01-27 ENCOUNTER — Ambulatory Visit (INDEPENDENT_AMBULATORY_CARE_PROVIDER_SITE_OTHER): Payer: Medicare Other | Admitting: Internal Medicine

## 2013-01-27 VITALS — BP 128/70 | HR 86 | Temp 98.0°F | Resp 16 | Wt 258.0 lb

## 2013-01-27 MED ORDER — FLUCONAZOLE 150 MG PO TABS: 150.0000 mg | ORAL_TABLET | Freq: Once | ORAL | Status: DC

## 2013-01-27 MED ORDER — LEVOFLOXACIN 500 MG PO TABS: 500.0000 mg | ORAL_TABLET | Freq: Every day | ORAL | Status: DC
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
## 2013-01-27 NOTE — Assessment & Plan Note (Signed)
Wheezing and dyspnea are better Decompensated without the nighttime oxygen--will need to requalify her for it with overnight oximetry Still seems to have infection Will treat with levaquin Fluconazole for vaginal yeast  Has been inconsistent with meds Asked her to be regular with symbicort again since her allergy season is coming up

## 2013-02-04 ENCOUNTER — Telehealth: Payer: Self-pay | Admitting: *Deleted

## 2013-02-04 ENCOUNTER — Other Ambulatory Visit: Payer: Self-pay | Admitting: Internal Medicine

## 2013-02-04 DIAGNOSIS — G4736 Sleep related hypoventilation in conditions classified elsewhere: Secondary | ICD-10-CM

## 2013-02-04 NOTE — Telephone Encounter (Signed)
Per [facility] pt needs an order for : "Nocturnal oxygen to be at ___ liters flow needed" on the order and faxed to [phone]  This comes from her overnight oximetry, her lowest Desat was 77% and she also spent 19 min and 4 sec below 88% so she does qualify.  ( all of this info comes from [place]) see results on your desk.

## 2013-02-04 NOTE — Telephone Encounter (Signed)
Order done in EMR, then handwritten when I got this phone note

## 2013-02-07 NOTE — Telephone Encounter (Signed)
rx was faxed to [facility] by Shirlee Limerick

## 2013-02-10 ENCOUNTER — Other Ambulatory Visit: Payer: Self-pay | Admitting: Internal Medicine

## 2013-02-10 ENCOUNTER — Ambulatory Visit: Payer: Self-pay | Admitting: Oncology

## 2013-02-10 NOTE — Telephone Encounter (Signed)
Rx sent to pharmacy by escript  

## 2013-02-14 ENCOUNTER — Ambulatory Visit: Payer: Self-pay | Admitting: Pain Medicine

## 2013-02-21 ENCOUNTER — Encounter: Payer: Self-pay | Admitting: Internal Medicine

## 2013-03-10 ENCOUNTER — Ambulatory Visit: Payer: Self-pay | Admitting: Oncology

## 2013-03-11 ENCOUNTER — Telehealth: Payer: Self-pay | Admitting: Neurology

## 2013-03-11 ENCOUNTER — Other Ambulatory Visit (INDEPENDENT_AMBULATORY_CARE_PROVIDER_SITE_OTHER): Payer: Medicare Other | Admitting: Diagnostic Neuroimaging

## 2013-03-11 DIAGNOSIS — G35D Multiple sclerosis, unspecified: Secondary | ICD-10-CM

## 2013-03-11 DIAGNOSIS — G35 Multiple sclerosis: Secondary | ICD-10-CM

## 2013-03-11 NOTE — Telephone Encounter (Signed)
I called patient. The MRI the brain that was done showed some white matter lesions consistent with multiple sclerosis. There was no change compared to the prior study done in 2009. I discussed this with the patient.

## 2013-03-11 NOTE — Procedures (Signed)
[facility]  NEUROIMAGING REPORT   STUDY DATE: 03/09/13 PATIENT NAME: Madeline [name] DOB: [birth year] MRN: [id number]  ORDERING CLINICIAN: York Spaniel, MD  CLINICAL HISTORY: 62 year old-year-old multiple sclerosis.  EXAM: MRI brain (with and without)  TECHNIQUE: MRI of the brain with and without contrast was obtained utilizing 5 mm axial slices with T1, T2, T2 flair, T2 star gradient echo and diffusion weighted views.  T1 sagittal, T2 coronal and postcontrast views in the axial and coronal plane were obtained. CONTRAST: 12 gadavist IMAGING SITE: [facility] [address]   FINDINGS:  No abnormal lesions are seen on diffusion-weighted views to suggest acute ischemia. The cortical sulci, fissures and cisterns are normal in size and appearance. Lateral, third and fourth ventricle are normal in size and appearance. No extra-axial fluid collections are seen. No evidence of mass effect or midline shift.    There are a few periventricular and subcortical foci of non-specific gliosis. No abnormal lesions are seen on post contrast views.    On sagittal views the posterior fossa, pituitary gland and corpus callosum are unremarkable. No evidence of intracranial hemorrhage on gradient-echo views. The orbits and their contents, paranasal sinuses and calvarium are unremarkable.  Intracranial flow voids are present.  IMPRESSION:  Abnormal MRI brain (with and without) demonstrating: 1. There are a few periventricular and subcortical foci of non-specific gliosis. Can be compatible with diagnosis of multiple sclerosis, but not specific for it. 2. No abnormal lesions are seen on post contrast views.   3. No change from MRI on 03/31/08.    INTERPRETING PHYSICIAN:  Suanne Marker, MD Certified in Neurology, Neurophysiology and Neuroimaging  [facility] [address], [address] [place], [place] [address] [phone]

## 2013-03-14 ENCOUNTER — Other Ambulatory Visit: Payer: Self-pay | Admitting: Internal Medicine

## 2013-03-14 ENCOUNTER — Encounter: Payer: Medicare Other | Admitting: Internal Medicine

## 2013-03-14 ENCOUNTER — Other Ambulatory Visit: Payer: Self-pay

## 2013-03-14 MED ORDER — INSULIN ASPART 100 UNIT/ML ~~LOC~~ SOLN: SUBCUTANEOUS | Status: DC

## 2013-03-14 NOTE — Telephone Encounter (Signed)
Pt left v/m requesting refill Novolog insulin to [facility]. Last A1C 08/27/12.Please advise.

## 2013-03-15 ENCOUNTER — Ambulatory Visit: Payer: Self-pay | Admitting: Pain Medicine

## 2013-03-23 ENCOUNTER — Ambulatory Visit: Payer: Self-pay | Admitting: Pain Medicine

## 2013-03-23 ENCOUNTER — Telehealth: Payer: Self-pay

## 2013-03-23 NOTE — Telephone Encounter (Signed)
T.D. With [facility] said insurance co wanted to know if Dr Alphonsus Sias would consider starting pt on an ace inhibitor or ARB due to pt's diagnosis.Please advise.

## 2013-03-24 NOTE — Telephone Encounter (Signed)
Notified [facility] of Dr. Karle Starch comments and they said they will call and check back with Dr. Alphonsus Sias after 03/28/13 (pt's appt date)

## 2013-03-24 NOTE — Telephone Encounter (Signed)
I will review this at her upcoming appt next week She has considerable polypharmacy and medical complexity----cancer, MS, etc etc  BP has been controlled Will at least check urine microal

## 2013-03-28 ENCOUNTER — Ambulatory Visit: Payer: Medicare Other | Admitting: Internal Medicine

## 2013-03-28 ENCOUNTER — Other Ambulatory Visit: Payer: Self-pay | Admitting: *Deleted

## 2013-03-28 NOTE — Telephone Encounter (Signed)
Faxed request from [facility] [place], pt has moved to that area

## 2013-03-29 MED ORDER — DIAZEPAM 5 MG PO TABS: ORAL_TABLET | ORAL | Status: DC

## 2013-03-29 MED ORDER — GLUCOSE BLOOD VI STRP: 1.0000 | ORAL_STRIP | Freq: Three times a day (TID) | Status: DC

## 2013-03-29 NOTE — Telephone Encounter (Signed)
Spoke with patient and advised results, patient states she will drive back and forth and will remain your patient rx called into pharmacy

## 2013-03-29 NOTE — Telephone Encounter (Signed)
okay

## 2013-03-29 NOTE — Telephone Encounter (Signed)
Okay test strips #100 x 2  Diazepam #60 x 0  Please speak to her about her plans. Is she going to get a new doctor locally (I recommend this)?

## 2013-03-29 NOTE — Telephone Encounter (Signed)
Mollie with [facility] left v/m re; diabetes and hypertension treatment recommendation and wanted to know if had been discussed with pt yet. Mollie request call back [phone] x [phone]. Pt cancelled 03/28/13 appt.Please advise.

## 2013-03-30 NOTE — Telephone Encounter (Signed)
Please see if patient is aware of starting the lisinopril ---the medicine is known to protect kidneys from diabetic damage and I had been planning to start but forgot. Thankfully her pharmacist alerted me to this and we are ready to start

## 2013-04-10 ENCOUNTER — Ambulatory Visit: Payer: Self-pay | Admitting: Oncology

## 2013-04-12 ENCOUNTER — Other Ambulatory Visit: Payer: Self-pay

## 2013-04-12 MED ORDER — INSULIN ASPART 100 UNIT/ML ~~LOC~~ SOLN: SUBCUTANEOUS | Status: DC

## 2013-04-12 MED ORDER — INSULIN GLARGINE 100 UNIT/ML ~~LOC~~ SOLN: 47.0000 [IU] | Freq: Two times a day (BID) | SUBCUTANEOUS | Status: DC

## 2013-04-12 NOTE — Telephone Encounter (Signed)
Pt request refill on novolog flexpen and lantus solostar to [facility]. Advised done. Pt said she will call back and schedule appt.

## 2013-04-14 ENCOUNTER — Ambulatory Visit: Payer: Self-pay | Admitting: Pain Medicine

## 2013-04-26 NOTE — Telephone Encounter (Signed)
Enc opened in error

## 2013-04-27 ENCOUNTER — Ambulatory Visit: Payer: Self-pay | Admitting: Pain Medicine

## 2013-05-09 ENCOUNTER — Other Ambulatory Visit: Payer: Self-pay | Admitting: *Deleted

## 2013-05-09 ENCOUNTER — Ambulatory Visit (INDEPENDENT_AMBULATORY_CARE_PROVIDER_SITE_OTHER): Payer: Medicare Other | Admitting: Internal Medicine

## 2013-05-09 ENCOUNTER — Encounter: Payer: Self-pay | Admitting: Internal Medicine

## 2013-05-09 ENCOUNTER — Other Ambulatory Visit: Payer: Self-pay | Admitting: Internal Medicine

## 2013-05-09 VITALS — BP 110/60 | HR 82 | Temp 98.5°F | Wt 257.0 lb

## 2013-05-09 DIAGNOSIS — E1149 Type 2 diabetes mellitus with other diabetic neurological complication: Secondary | ICD-10-CM

## 2013-05-09 DIAGNOSIS — R300 Dysuria: Secondary | ICD-10-CM

## 2013-05-09 DIAGNOSIS — N3000 Acute cystitis without hematuria: Secondary | ICD-10-CM

## 2013-05-09 DIAGNOSIS — M109 Gout, unspecified: Secondary | ICD-10-CM | POA: Insufficient documentation

## 2013-05-09 LAB — POCT URINALYSIS DIPSTICK
Ketones, UA: NEGATIVE
Protein, UA: NEGATIVE
Spec Grav, UA: 1.015
pH, UA: 6.5

## 2013-05-09 LAB — HEMOGLOBIN A1C: Hgb A1c MFr Bld: 7.3 % — ABNORMAL HIGH (ref 4.6–6.5)

## 2013-05-09 MED ORDER — CIPROFLOXACIN HCL 250 MG PO TABS: 250.0000 mg | ORAL_TABLET | Freq: Two times a day (BID) | ORAL | Status: DC

## 2013-05-09 MED ORDER — DIAZEPAM 5 MG PO TABS: ORAL_TABLET | ORAL | Status: DC

## 2013-05-09 NOTE — Assessment & Plan Note (Signed)
Doesn't seem to be active Continue current meds Some of the pain may be related to fall --though she thinks she just hit hip

## 2013-05-09 NOTE — Addendum Note (Signed)
Addended by: Sueanne Margarita on: 05/09/2013 02:58 PM   Modules accepted: Orders

## 2013-05-09 NOTE — Assessment & Plan Note (Signed)
Has classic burning dysuria Did have unprotected sex but symptoms unlikely to be from STD Will try empiric antibiotic

## 2013-05-09 NOTE — Assessment & Plan Note (Signed)
Lab Results  Component Value Date   HGBA1C 7.0* 08/27/2012   She can't afford the frequent blood work--she has to have copayment Will just do the A1c though

## 2013-05-09 NOTE — Telephone Encounter (Signed)
Last filled 03/29/13

## 2013-05-09 NOTE — Progress Notes (Signed)
Subjective:    Patient ID: Madeline [name], female    DOB: 02/18/1951, 62 y.o.   MRN: [id number]  HPI Feels she had a bad attack with the gout this week Right hand swollen Takes allopurinol and meloxicam daily  Noticed her "bottom" burning in past 3-4 days Tried a left over diflucan without a help Does have burning during urination  Chronic urgency and frequency--she relates to lasix  No fever No hematuria  Is engage to man from [place] Did have sex---without protection --several times Last ~1 month ago Was down there for 2.5 months They found they weren't compatible due to temperature differences (he wants it over 80 and she is too hot)  Current Outpatient Prescriptions on File Prior to Visit  Medication Sig Dispense Refill  . allopurinol (ZYLOPRIM) 100 MG tablet TAKE ONE TABLET BY MOUTH ONE TIME DAILY  60 tablet  11  . aspirin 325 MG tablet Take 325 mg by mouth daily.        . budesonide-formoterol (SYMBICORT) 160-4.5 MCG/ACT inhaler Inhale 2 puffs into the lungs 2 (two) times daily as needed.       . Calcium Carbonate-Vitamin D (CALCIUM-VITAMIN D) 600-200 MG-UNIT CAPS Take by mouth daily.        . carvedilol (COREG) 25 MG tablet Take 1 tablet (25 mg total) by mouth 2 (two) times daily with a meal.  180 tablet  3  . diazepam (VALIUM) 5 MG tablet Take 1/2 -1 tablet by mouth twice daily as needed for nerves  60 tablet  0  . fish oil-omega-3 fatty acids 1000 MG capsule Take 2 g by mouth daily.        Marland Kitchen FLUoxetine (PROZAC) 20 MG capsule Take 1 capsule (20 mg total) by mouth daily.  30 capsule  11  . furosemide (LASIX) 40 MG tablet Take 80 mg by mouth daily.      Marland Kitchen gabapentin (NEURONTIN) 300 MG capsule Take 8 tablets by mouth once daily      . glatiramer (COPAXONE) 20 MG/ML injection Inject 20 mg into the skin daily.        Marland Kitchen glucose blood (BAYER CONTOUR TEST) test strip 1 each by Other route 3 (three) times daily. Dx: 250.60  100 each  2  . insulin aspart (NOVOLOG FLEXPEN) 100  UNIT/ML injection Sliding scale  10 pen  1  . insulin glargine (LANTUS) 100 UNIT/ML injection Inject 0.47 mLs (47 Units total) into the skin 2 (two) times daily.  5 pen  1  . Insulin Pen Needle (BD ULTRA-FINE PEN NEEDLES) 29G X 12.7MM MISC Patient test blood sugar 3-4 times daily       . letrozole (FEMARA) 2.5 MG tablet Take 2.5 mg by mouth daily.        Marland Kitchen levothyroxine (SYNTHROID, LEVOTHROID) 175 MCG tablet Take 1 tablet (175 mcg total) by mouth daily.  90 tablet  3  . lidocaine (LIDODERM) 5 % Place 1-2 patches onto the skin every 12 (twelve) hours. then remove for 12 hours, repeat if tolerated  30 patch  0  . meloxicam (MOBIC) 15 MG tablet Take 15 mg by mouth daily as needed.      . metFORMIN (GLUCOPHAGE) 1000 MG tablet TAKE ONE TABLET BY MOUTH TWICE DAILY WITH FOOD  180 tablet  0  . omeprazole (PRILOSEC) 20 MG capsule TAKE ONE CAPSULE BY MOUTH TWICE DAILY  60 capsule  11  . potassium chloride (K-DUR) 10 MEQ tablet Take 2 tablets (20 mEq total) by  mouth daily.  180 tablet  3  . PROAIR HFA 108 (90 BASE) MCG/ACT inhaler INHALE TWO PUFFS BY MOUTH UP TO FOUR TIMES A DAY AS NEEDED  27 g  1  . tiotropium (SPIRIVA) 18 MCG inhalation capsule Place 18 mcg into inhaler and inhale daily as needed.        Marland Kitchen tiZANidine (ZANAFLEX) 2 MG tablet Take one to two tablets by mouth three times a day as needed.  180 tablet  0  . traZODone (DESYREL) 100 MG tablet Take 1/2 tablet by mouth every morning and take 2 tablets by mouth at bedtime  75 tablet  11   No current facility-administered medications on file prior to visit.    Allergies  Allergen Reactions  . Penicillins Swelling    tongue and throat swelling tolerates cephalosporins  . Rosiglitazone Maleate Swelling    Past Medical History  Diagnosis Date  . Depression   . Goiter, nodular     multi  . Dyslipidemia   . NIDDM, uncontrolled, with neuropathy   . Hypertension   . Allergy   . GERD (gastroesophageal reflux disease)   . Urinary incontinence    . Peripheral neuropathy   . Multiple sclerosis   . Breast cancer   . Hypothyroidism   . Asthma   . COPD (chronic obstructive pulmonary disease)   . DVT (deep venous thrombosis) 1990's    right leg    Past Surgical History  Procedure Laterality Date  . Thyroidectomy  09/2004  . Transthoracic echocardiogram  05/16/2004  . Partial hysterectomy  1975  . Axillary hidradenitis excision  1993    Excision biopsy growth right axilla, benign   . Other surgical history      Thyroid biopsy 10/99  . Carpal tunnel release  03/2008    bilateral  . Mastectomy, radical  02/2009    left modified  . Tonsillectomy    . Appendectomy      Family History  Problem Relation Age of Onset  . Kidney failure Mother   . Heart disease Father 10  . Heart disease Brother   . Heart disease Brother     History   Social History  . Marital Status: Widowed    Spouse Name: N/A    Number of Children: 4  . Years of Education: N/A   Occupational History  . [employer] bondsman     Getting back to work now   Social History Main Topics  . Smoking status: Current Every Day Smoker -- 0.50 packs/day    Types: Cigarettes    Last Attempt to Quit: 11/11/2003  . Smokeless tobacco: Never Used  . Alcohol Use: No  . Drug Use: No  . Sexually Active: Not on file   Other Topics Concern  . Not on file   Social History Narrative   Widowed 1999 then 2nd Marriage--2000. Widowed again 2009   Living with daughter now   Has living will   Daughter [name] is health care POA.   Would accept rescitation but no prolonged artificial ventilation   No feeding tube if cognitively unaware   Review of Systems Some nausea today No vomiting     Objective:   Physical Exam  Constitutional: She appears well-developed. No distress.  Abdominal: Soft. There is tenderness. There is no rebound and no guarding.  Very slight LLQ tenderness (towards midline though)   Musculoskeletal:  No CVA tenderness Mild swelling of right  2nd and 3rd PIP joints. Not tender No foot findings,  even in MTPs          Assessment & Plan:

## 2013-05-10 ENCOUNTER — Ambulatory Visit: Payer: Self-pay | Admitting: Oncology

## 2013-05-10 ENCOUNTER — Encounter: Payer: Self-pay | Admitting: Family Medicine

## 2013-05-12 ENCOUNTER — Ambulatory Visit: Payer: Self-pay | Admitting: Pain Medicine

## 2013-05-16 ENCOUNTER — Ambulatory Visit (INDEPENDENT_AMBULATORY_CARE_PROVIDER_SITE_OTHER): Payer: Medicare Other | Admitting: Internal Medicine

## 2013-05-16 ENCOUNTER — Telehealth: Payer: Self-pay

## 2013-05-16 ENCOUNTER — Encounter: Payer: Self-pay | Admitting: Internal Medicine

## 2013-05-16 ENCOUNTER — Encounter: Payer: Self-pay | Admitting: Radiology

## 2013-05-16 ENCOUNTER — Other Ambulatory Visit: Payer: Self-pay | Admitting: Internal Medicine

## 2013-05-16 VITALS — BP 112/60 | HR 81 | Temp 97.8°F | Wt 256.0 lb

## 2013-05-16 DIAGNOSIS — B820 Intestinal helminthiasis, unspecified: Secondary | ICD-10-CM | POA: Insufficient documentation

## 2013-05-16 NOTE — Assessment & Plan Note (Addendum)
Passed worm last week Will send stool for ova and parasites and treat when we get a diagnosis Ascaris??

## 2013-05-16 NOTE — Telephone Encounter (Signed)
Pt said on 05/12/13 pt saw a worm after having BM. Pt scheduled appt today at 2 pm with Dr Alphonsus Sias.

## 2013-05-16 NOTE — Progress Notes (Signed)
Subjective:    Patient ID: Madeline [name], female    DOB: [birth year], 62 y.o.   MRN: [id number]  HPI Noticed a worm from her rectum Probably 5-6 inches and still wiggling Noticed in bathroom when she wiped herself (4 days ago) No further abnormalities No blood in stool Some constipation  Did take cruise 2 years ago--to [place] in [place] No other foreign travel  Current Outpatient Prescriptions on File Prior to Visit  Medication Sig Dispense Refill  . allopurinol (ZYLOPRIM) 100 MG tablet TAKE ONE TABLET BY MOUTH ONE TIME DAILY  60 tablet  11  . aspirin 325 MG tablet Take 325 mg by mouth daily.        . budesonide-formoterol (SYMBICORT) 160-4.5 MCG/ACT inhaler Inhale 2 puffs into the lungs 2 (two) times daily as needed.       . Calcium Carbonate-Vitamin D (CALCIUM-VITAMIN D) 600-200 MG-UNIT CAPS Take by mouth daily.        . carvedilol (COREG) 25 MG tablet Take 1 tablet (25 mg total) by mouth 2 (two) times daily with a meal.  180 tablet  3  . diazepam (VALIUM) 5 MG tablet Take 1/2 -1 tablet by mouth twice daily as needed for nerves  60 tablet  0  . fish oil-omega-3 fatty acids 1000 MG capsule Take 2 g by mouth daily.        Marland Kitchen FLUoxetine (PROZAC) 20 MG capsule Take 1 capsule (20 mg total) by mouth daily.  30 capsule  11  . gabapentin (NEURONTIN) 300 MG capsule Take 8 tablets by mouth once daily      . glatiramer (COPAXONE) 20 MG/ML injection Inject 20 mg into the skin daily.        Marland Kitchen glucose blood (BAYER CONTOUR TEST) test strip 1 each by Other route 3 (three) times daily. Dx: 250.60  100 each  2  . insulin aspart (NOVOLOG FLEXPEN) 100 UNIT/ML injection Sliding scale  10 pen  1  . insulin glargine (LANTUS) 100 UNIT/ML injection Inject 0.47 mLs (47 Units total) into the skin 2 (two) times daily.  5 pen  1  . Insulin Pen Needle (BD ULTRA-FINE PEN NEEDLES) 29G X 12.7MM MISC Patient test blood sugar 3-4 times daily       . letrozole (FEMARA) 2.5 MG  tablet Take 2.5 mg by mouth daily.        Marland Kitchen levothyroxine (SYNTHROID, LEVOTHROID) 175 MCG tablet Take 1 tablet (175 mcg total) by mouth daily.  90 tablet  3  . lidocaine (LIDODERM) 5 % Place 1-2 patches onto the skin every 12 (twelve) hours. then remove for 12 hours, repeat if tolerated  30 patch  0  . meloxicam (MOBIC) 15 MG tablet Take 15 mg by mouth daily as needed.      . metFORMIN (GLUCOPHAGE) 1000 MG tablet TAKE ONE TABLET BY MOUTH TWICE DAILY WITH FOOD  180 tablet  0  . omeprazole (PRILOSEC) 20 MG capsule TAKE ONE CAPSULE BY MOUTH TWICE DAILY  60 capsule  11  . potassium chloride (K-DUR) 10 MEQ tablet Take 2 tablets (20 mEq total) by mouth daily.  180 tablet  3  . PROAIR HFA 108 (90 BASE) MCG/ACT inhaler INHALE TWO PUFFS BY MOUTH UP TO FOUR TIMES A DAY AS NEEDED  27 g  1  . tiotropium (SPIRIVA) 18 MCG inhalation capsule Place 18 mcg into inhaler and inhale daily as needed.        Marland Kitchen tiZANidine (ZANAFLEX) 2  MG tablet TAKE 1-2 TABLETS BY MOUTH THREE TIMES A DAY  AS NEEDED  180 tablet  0  . traZODone (DESYREL) 100 MG tablet Take 1/2 tablet by mouth every morning and take 2 tablets by mouth at bedtime  75 tablet  11   No current facility-administered medications on file prior to visit.    Allergies  Allergen Reactions  . Penicillins Swelling    tongue and throat swelling tolerates cephalosporins  . Rosiglitazone Maleate Swelling    Past Medical History  Diagnosis Date  . Depression   . Goiter, nodular     multi  . Dyslipidemia   . NIDDM, uncontrolled, with neuropathy   . Hypertension   . Allergy   . GERD (gastroesophageal reflux disease)   . Urinary incontinence   . Peripheral neuropathy   . Multiple sclerosis   . Breast cancer   . Hypothyroidism   . Asthma   . COPD (chronic obstructive pulmonary disease)   . DVT (deep venous thrombosis) 1990's    right leg  . Gout     Past Surgical History  Procedure Laterality Date  . Thyroidectomy  09/2004  . Transthoracic  echocardiogram  05/16/2004  . Partial hysterectomy  1975  . Axillary hidradenitis excision  1993    Excision biopsy growth right axilla, benign   . Other surgical history      Thyroid biopsy 10/99  . Carpal tunnel release  03/2008    bilateral  . Mastectomy, radical  02/2009    left modified  . Tonsillectomy    . Appendectomy      Family History  Problem Relation Age of Onset  . Kidney failure Mother   . Heart disease Father 72  . Heart disease Brother   . Heart disease Brother     History   Social History  . Marital Status: Widowed    Spouse Name: N/A    Number of Children: 4  . Years of Education: N/A   Occupational History  . [employer] bondsman     Getting back to work now   Social History Main Topics  . Smoking status: Current Every Day Smoker -- 0.50 packs/day    Types: Cigarettes    Last Attempt to Quit: 11/11/2003  . Smokeless tobacco: Never Used  . Alcohol Use: No  . Drug Use: No  . Sexually Active: Not on file   Other Topics Concern  . Not on file   Social History Narrative   Widowed 1999 then 2nd Marriage--2000. Widowed again 2009   Living with daughter now   Has living will   Daughter [name] is health care POA.   Would accept rescitation but no prolonged artificial ventilation   No feeding tube if cognitively unaware   Review of Systems Appetite is fine Weight fairly stable Urinary symptoms have resolved     Objective:   Physical Exam  Abdominal: Soft. She exhibits no distension and no mass. There is no tenderness. There is no rebound and no guarding.          Assessment & Plan:

## 2013-05-23 ENCOUNTER — Ambulatory Visit: Payer: Self-pay | Admitting: Pain Medicine

## 2013-05-23 ENCOUNTER — Other Ambulatory Visit: Payer: Medicare Other

## 2013-05-23 DIAGNOSIS — B820 Intestinal helminthiasis, unspecified: Secondary | ICD-10-CM

## 2013-05-24 ENCOUNTER — Telehealth: Payer: Self-pay

## 2013-05-24 NOTE — Telephone Encounter (Signed)
Pt left v/m requesting call back when test results are available.

## 2013-05-25 LAB — OVA AND PARASITE EXAMINATION: OP: NONE SEEN

## 2013-05-25 NOTE — Telephone Encounter (Signed)
Test still in process, when results come in we will call pt. Spoke with patient and advised

## 2013-05-27 ENCOUNTER — Telehealth: Payer: Self-pay

## 2013-05-27 NOTE — Telephone Encounter (Signed)
Pt called and received results of recent labs. Pt said she has abdominal swelling and pain in lower abdomen, a lot of gas, as well as swelling in legs and feet. Legs and feet swelling goes down some during the night; pt has been wearing support hose also. Pt surprised about lab report because there were worms collected in the stool specimen. Pt will try to save next BM with worms in it and notify office. Pt wants to know if anything can be done now to get rid of worms; pt said she feels terrible. Pt request cb.[facility].

## 2013-05-27 NOTE — Telephone Encounter (Signed)
Spoke with patient and advised results, she will get a worm and bring it in for the lab.

## 2013-05-27 NOTE — Telephone Encounter (Signed)
Until I know what worm it is, I cannot treat I can get a definitive diagnosis and start treatment better with the worm, then from a plain stool specimen

## 2013-05-27 NOTE — Telephone Encounter (Signed)
Pt brought in what she thought was a worm; Dr Alphonsus Sias looked at and said that was not a worm; it was thready stool. Advised that was reassuring. Pt voiced understanding.

## 2013-06-01 ENCOUNTER — Encounter: Payer: Self-pay | Admitting: Internal Medicine

## 2013-06-07 ENCOUNTER — Ambulatory Visit: Payer: Self-pay | Admitting: Pain Medicine

## 2013-06-10 ENCOUNTER — Ambulatory Visit: Payer: Self-pay | Admitting: Oncology

## 2013-06-15 ENCOUNTER — Other Ambulatory Visit: Payer: Self-pay | Admitting: Internal Medicine

## 2013-06-16 ENCOUNTER — Other Ambulatory Visit: Payer: Self-pay | Admitting: *Deleted

## 2013-06-16 NOTE — Telephone Encounter (Signed)
Last filled 05/09/13 [facility] PATIENT, Please send back to me for call in

## 2013-06-17 MED ORDER — DIAZEPAM 5 MG PO TABS: ORAL_TABLET | ORAL | Status: DC

## 2013-06-17 NOTE — Telephone Encounter (Signed)
rx called in

## 2013-06-17 NOTE — Telephone Encounter (Signed)
Please call in

## 2013-06-19 LAB — CANCER ANTIGEN 27.29: CA 27.29: 20.7 U/mL (ref 0.0–38.6)

## 2013-06-20 ENCOUNTER — Ambulatory Visit (INDEPENDENT_AMBULATORY_CARE_PROVIDER_SITE_OTHER)
Admission: RE | Admit: 2013-06-20 | Discharge: 2013-06-20 | Disposition: A | Discharge status: Home / Self Care | Payer: Medicare Other | Source: Ambulatory Visit | Attending: Family Medicine | Admitting: Family Medicine

## 2013-06-20 ENCOUNTER — Telehealth: Payer: Self-pay

## 2013-06-20 ENCOUNTER — Ambulatory Visit (INDEPENDENT_AMBULATORY_CARE_PROVIDER_SITE_OTHER): Payer: Medicare Other | Admitting: Family Medicine

## 2013-06-20 ENCOUNTER — Telehealth: Payer: Self-pay | Admitting: Internal Medicine

## 2013-06-20 ENCOUNTER — Encounter: Payer: Self-pay | Admitting: Family Medicine

## 2013-06-20 VITALS — BP 132/76 | HR 72 | Temp 97.8°F | Wt 254.5 lb

## 2013-06-20 DIAGNOSIS — J441 Chronic obstructive pulmonary disease with (acute) exacerbation: Secondary | ICD-10-CM

## 2013-06-20 MED ORDER — PREDNISONE 20 MG PO TABS: ORAL_TABLET | ORAL | Status: DC

## 2013-06-20 MED ORDER — DOXYCYCLINE HYCLATE 100 MG PO CAPS: 100.0000 mg | ORAL_CAPSULE | Freq: Two times a day (BID) | ORAL | Status: DC

## 2013-06-20 MED ORDER — FLUCONAZOLE 150 MG PO TABS: 150.0000 mg | ORAL_TABLET | Freq: Once | ORAL | Status: DC

## 2013-06-20 MED ORDER — ALBUTEROL SULFATE HFA 108 (90 BASE) MCG/ACT IN AERS: INHALATION_SPRAY | RESPIRATORY_TRACT | Status: DC

## 2013-06-20 NOTE — Telephone Encounter (Signed)
Patient Information:  Caller Name: Clorene  Phone: [phone]  Patient: Madeline [name], Madeline [name]  Gender: Female  DOB: 14-Dec-1950  Age: 62 Years  PCP: Tillman Abide [facility])  Office Follow Up:  Does the office need to follow up with this patient?: No  Instructions For The Office: N/A  RN Note:  Pt has productive cough, yellow sputum, w/ wheeze, Albuterol inhaler helps, symptoms return.  Pt has generalized weakness w/ SOB and Cough w/ history of Asthma. Appt scheduled. Dr Alphonsus Sias had no availablity.   Symptoms  Reason For Call & Symptoms: ER CALL. Cough  Reviewed Health History In EMR: N/A  Reviewed Medications In EMR: N/A  Reviewed Allergies In EMR: N/A  Reviewed Surgeries / Procedures: N/A  Date of Onset of Symptoms: 06/16/2013  Treatments Tried: Albuterol  Treatments Tried Worked: No  Guideline(s) Used:  Asthma Attack  Disposition Per Guideline:   Go to Office Now  Reason For Disposition Reached:   Moderate asthma attack (e.g., SOB at rest, speaks in phrases, audible wheezes) and not resolved after 2 nebulizer or inhaler treatments given 20 minutes apart  Advice Given:  N/A  Patient Will Follow Care Advice:  YES  Appointment Scheduled:  06/20/2013 11:15:00 Appointment Scheduled Provider:  Eustaquio Boyden [facility])

## 2013-06-20 NOTE — Telephone Encounter (Signed)
Jennifer with [employer] said pt is waiting and [facility] wanted to verify which prednisone rx to fill;  received one for # 12 and another for # 9. Dr Sharen Hones said to fill the quantity # 9 prescription. Victorino Dike voiced understanding.

## 2013-06-20 NOTE — Assessment & Plan Note (Addendum)
Pt has developed acute COPD exacerbation as evidenced by increased cough, sputum, production and dyspnea, however given lung exam there is concern for underlying infectious process vs other.   Check CXR today - negative for PNA or mass on my read - will await radiologist read. Treat with steroid course, and antibiotic course.  Pt requests diflucan prn yeast infection after abx. Take albuterol regularly for next 3 days. To return if sxs persist or any deterioration despite treatment.  Lab Results  Component Value Date   HGBA1C 7.3* 05/09/2013  will try to minimize steroid exposure given h/o DM.

## 2013-06-20 NOTE — Progress Notes (Signed)
  Subjective:    Patient ID: Madeline [name], female    DOB: October 12, 1951, 62 y.o.   MRN: [id number]  HPI CC: "bronchitis"  Pleasant 62 yo with h/o asthma, COPD, MS, current smoker presents with 5-6d h/o cough productive of whitish yellow mucous, dyspnea and wheezing.  Some headache, nausea with diarrhea, some PNdrainage. Has been on O2 regularly for last several days as well as albuterol bid regularly.  Denies fevers/chils, tooth pain, ST. Lives with daughter and grandchildren - who have been sick recently. Smoking - 1/2 ppd.  Not smoking recently 2/2 feeling ill.  On spiriva (although not currently taking - cannot afford), symbicort 160 2 puffs twice daily.  Using albuterol 2 puffs bid as well (prn).  No recent xray.   H/o breast cancer s/p L mastectomy  Past Medical History  Diagnosis Date  . Depression   . Goiter, nodular     multi  . Dyslipidemia   . NIDDM, uncontrolled, with neuropathy   . Hypertension   . Allergy   . GERD (gastroesophageal reflux disease)   . Urinary incontinence   . Peripheral neuropathy   . Multiple sclerosis   . Breast cancer   . Hypothyroidism   . Asthma   . COPD (chronic obstructive pulmonary disease)   . DVT (deep venous thrombosis) 1990's    right leg  . Gout     Past Surgical History  Procedure Laterality Date  . Thyroidectomy  09/2004  . Transthoracic echocardiogram  05/16/2004  . Partial hysterectomy  1975  . Axillary hidradenitis excision  1993    Excision biopsy growth right axilla, benign   . Other surgical history      Thyroid biopsy 10/99  . Carpal tunnel release  03/2008    bilateral  . Mastectomy, radical  02/2009    left modified  . Tonsillectomy    . Appendectomy     Review of Systems Per HPI    Objective:   Physical Exam  Nursing note and vitals reviewed. Constitutional: She appears well-developed and well-nourished. No distress.  Tired but nontoxic appearing  HENT:  Head: Normocephalic and atraumatic.  Right  Ear: Hearing, tympanic membrane, external ear and ear canal normal.  Left Ear: Hearing, tympanic membrane, external ear and ear canal normal.  Nose: No mucosal edema or rhinorrhea. Right sinus exhibits maxillary sinus tenderness. Right sinus exhibits no frontal sinus tenderness. Left sinus exhibits maxillary sinus tenderness. Left sinus exhibits no frontal sinus tenderness.  Mouth/Throat: Uvula is midline, oropharynx is clear and moist and mucous membranes are normal. No oropharyngeal exudate, posterior oropharyngeal edema, posterior oropharyngeal erythema or tonsillar abscesses.  Eyes: Conjunctivae and EOM are normal. Pupils are equal, round, and reactive to light. No scleral icterus.  Neck: Normal range of motion. Neck supple.  Cardiovascular: Normal rate, regular rhythm, normal heart sounds and intact distal pulses.   No murmur heard. Pulmonary/Chest: Effort normal. No respiratory distress. She has decreased breath sounds. She has wheezes (exp coarse throughout). She has rhonchi (scattered). She has rales (faint bibasilar R>L).  Lymphadenopathy:    She has no cervical adenopathy.  Skin: Skin is warm and dry. No rash noted.       Assessment & Plan:

## 2013-06-20 NOTE — Telephone Encounter (Signed)
Seen today. 

## 2013-06-20 NOTE — Addendum Note (Signed)
Addended by: Eustaquio Boyden on: 06/20/2013 12:57 PM   Modules accepted: Orders, Medications

## 2013-06-20 NOTE — Patient Instructions (Addendum)
You have COPD flare. Xray looking ok today. Treat with steroid course (sent to pharmacy).  Take doxycycline 10 day course (antibiotic).  Take albuterol regularly (2 puffs every 6-8 hours) for the next 3 days then slowly back down as able. I have sent diflucan to your pharmacy as well - take if needed after antibiotics. Good to see you today, call us with questions.  Return here or seek urgent care if any worsening cough or trouble breathing.

## 2013-06-28 ENCOUNTER — Telehealth: Payer: Self-pay | Admitting: Internal Medicine

## 2013-06-28 NOTE — Telephone Encounter (Signed)
Please check on her tomorrow if not admitted 

## 2013-06-28 NOTE — Telephone Encounter (Signed)
Spoke with patient and she did not go to the hospital because she was told by the triage nurse to go to [facility] and the EMS would not take her to [facility] because [facility] was closest, per pt her blood sugar is now at 253 ( she checked it while I was on the phone) I told her should have went to [facility] and per pt they (triage) didn't say [facility], they said [facility], pt still complains of stomach and chest pain. I still advised pt to go to [facility] and she wanted me to tell Dr. Alphonsus Sias and see what he has to say.

## 2013-06-28 NOTE — Telephone Encounter (Signed)
Patient Information:  Caller Name: Clytie  Phone: [phone]  Patient: Madeline [name]  Gender: Female  DOB: July 24, 1951  Age: 62 Years  PCP: Tillman Abide [facility])  Office Follow Up:  Does the office need to follow up with this patient?: No  Instructions For The Office: N/A  RN Note:  Continues to feel poorly after hypoglycemia episode with ongoing weakness, and more intense left chest pain for past week she related to chronic mastectomy pain. Due to weakness and > chest pain with prolonged hypoglycemia episode this morning, advised to go to [facility] ED now.  Symptoms  Reason For Call & Symptoms: Hypoglycemia episode 06/28/13 with blood sugar 52 at 0900.  Ate egg McMuffin with little change to blood sugar so ate 7 chocolate cookies and laid down to rest.  Blood sugar 92 at 1100.  Reviewed Health History In EMR: Yes  Reviewed Medications In EMR: Yes  Reviewed Allergies In EMR: Yes  Reviewed Surgeries / Procedures: Yes  Date of Onset of Symptoms: 06/28/2013  Treatments Tried: ate food  Treatments Tried Worked: Yes  Guideline(s) Used:  Diabetes - Low Blood Sugar  Disposition Per Guideline:   Go to ED Now (or to Office with PCP Approval)  Reason For Disposition Reached:   Patient sounds very sick or weak to the triager  Advice Given:  N/A  RN Overrode Recommendation:  Go To ED  [facility]

## 2013-06-29 NOTE — Telephone Encounter (Signed)
See if she would like to come in here at 12:30 --since it doesn't appear to be an emergency situation at this point

## 2013-06-29 NOTE — Telephone Encounter (Signed)
Okay, we'll just see how she does

## 2013-06-29 NOTE — Telephone Encounter (Signed)
Spoke with patient and she declined the appt, per pt " I really don't feel like coming in" pt states she's feeling a little better

## 2013-07-04 ENCOUNTER — Ambulatory Visit: Payer: Self-pay | Admitting: Pain Medicine

## 2013-07-06 ENCOUNTER — Encounter: Payer: Self-pay | Admitting: Internal Medicine

## 2013-07-06 ENCOUNTER — Ambulatory Visit (INDEPENDENT_AMBULATORY_CARE_PROVIDER_SITE_OTHER): Payer: Medicare Other | Admitting: Internal Medicine

## 2013-07-06 VITALS — BP 112/84 | HR 75 | Temp 97.5°F | Wt 261.8 lb

## 2013-07-06 DIAGNOSIS — J449 Chronic obstructive pulmonary disease, unspecified: Secondary | ICD-10-CM

## 2013-07-06 DIAGNOSIS — R609 Edema, unspecified: Secondary | ICD-10-CM

## 2013-07-06 DIAGNOSIS — G35D Multiple sclerosis, unspecified: Secondary | ICD-10-CM

## 2013-07-06 DIAGNOSIS — F17200 Nicotine dependence, unspecified, uncomplicated: Secondary | ICD-10-CM

## 2013-07-06 DIAGNOSIS — G35 Multiple sclerosis: Secondary | ICD-10-CM

## 2013-07-06 DIAGNOSIS — J441 Chronic obstructive pulmonary disease with (acute) exacerbation: Secondary | ICD-10-CM

## 2013-07-06 DIAGNOSIS — F17210 Nicotine dependence, cigarettes, uncomplicated: Secondary | ICD-10-CM

## 2013-07-06 DIAGNOSIS — J4489 Other specified chronic obstructive pulmonary disease: Secondary | ICD-10-CM

## 2013-07-06 MED ORDER — TIOTROPIUM BROMIDE MONOHYDRATE 18 MCG IN CAPS: 18.0000 ug | ORAL_CAPSULE | Freq: Every day | RESPIRATORY_TRACT | Status: DC | PRN

## 2013-07-06 MED ORDER — FUROSEMIDE 80 MG PO TABS: 80.0000 mg | ORAL_TABLET | Freq: Every day | ORAL | Status: DC

## 2013-07-06 MED ORDER — LEVOFLOXACIN 500 MG PO TABS: 500.0000 mg | ORAL_TABLET | Freq: Every day | ORAL | Status: DC

## 2013-07-06 MED ORDER — NICOTINE 21 MG/24HR TD PT24: 1.0000 | MEDICATED_PATCH | TRANSDERMAL | Status: DC

## 2013-07-06 NOTE — Assessment & Plan Note (Signed)
Doesn't seem to be significantly exacerbated Will avoid prednisone given edema Rx for spiriva so she can restart this

## 2013-07-06 NOTE — Assessment & Plan Note (Signed)
Increased sputum production still Will change antibiotic to levaquin

## 2013-07-06 NOTE — Assessment & Plan Note (Signed)
Desperate to stop and knows she needs to Failed hypnosis Did have some success with patch---will try this again Use gum/lozenge prn Counseled 3-4 minutes

## 2013-07-06 NOTE — Progress Notes (Signed)
Subjective:    Patient ID: Madeline [name], female    DOB: 10-05-1951, 62 y.o.   MRN: [id number]  HPI Still not feeling well Finished the prednisone about a week ago or more Now having more swelling in her legs  Having pain across chest--really worse when wearing bra (pressure feeling) Cramps in lower ribs area bilateral  Still coughing---mostly early AM. Same yellowish sputum No fever Some SOB--- mostly with any exertion Some wheezing--- inhaler does help Is taking symbicort but not the spiriva (ran out) Nebulizer in past---lost since last move  Current Outpatient Prescriptions on File Prior to Visit  Medication Sig Dispense Refill  . albuterol (PROAIR HFA) 108 (90 BASE) MCG/ACT inhaler INHALE TWO PUFFS BY MOUTH UP TO FOUR TIMES A DAY AS NEEDED  18 g  3  . allopurinol (ZYLOPRIM) 100 MG tablet TAKE ONE TABLET BY MOUTH ONE TIME DAILY  60 tablet  11  . aspirin 325 MG tablet Take 325 mg by mouth daily.        . budesonide-formoterol (SYMBICORT) 160-4.5 MCG/ACT inhaler Inhale 2 puffs into the lungs 2 (two) times daily as needed.       . Calcium Carbonate-Vitamin D (CALCIUM-VITAMIN D) 600-200 MG-UNIT CAPS Take by mouth daily.        . carvedilol (COREG) 25 MG tablet Take 1 tablet (25 mg total) by mouth 2 (two) times daily with a meal.  180 tablet  3  . diazepam (VALIUM) 5 MG tablet Take 1/2 -1 tablet by mouth twice daily as needed for nerves  60 tablet  0  . fish oil-omega-3 fatty acids 1000 MG capsule Take 2 g by mouth daily.        Marland Kitchen FLUoxetine (PROZAC) 20 MG capsule Take 1 capsule (20 mg total) by mouth daily.  30 capsule  11  . furosemide (LASIX) 40 MG tablet TAKE ONE AND ONE-HALF TABLETS BY MOUTH DAILY   135 tablet  0  . gabapentin (NEURONTIN) 300 MG capsule Take 8 tablets by mouth once daily      . glatiramer (COPAXONE) 20 MG/ML injection Inject 20 mg into the skin daily.        Marland Kitchen glucose blood (BAYER CONTOUR TEST) test strip 1 each by Other route 3 (three) times daily. Dx:  250.60  100 each  2  . insulin aspart (NOVOLOG FLEXPEN) 100 UNIT/ML injection Sliding scale  10 pen  1  . insulin glargine (LANTUS) 100 UNIT/ML injection Inject 0.47 mLs (47 Units total) into the skin 2 (two) times daily.  5 pen  1  . Insulin Pen Needle (BD ULTRA-FINE PEN NEEDLES) 29G X 12.7MM MISC Patient test blood sugar 3-4 times daily       . letrozole (FEMARA) 2.5 MG tablet Take 2.5 mg by mouth daily.        Marland Kitchen levothyroxine (SYNTHROID, LEVOTHROID) 175 MCG tablet Take 1 tablet (175 mcg total) by mouth daily.  90 tablet  3  . lidocaine (LIDODERM) 5 % Place 1-2 patches onto the skin every 12 (twelve) hours. then remove for 12 hours, repeat if tolerated  30 patch  0  . meloxicam (MOBIC) 15 MG tablet Take 15 mg by mouth daily as needed.      . metFORMIN (GLUCOPHAGE) 1000 MG tablet TAKE ONE TABLET BY MOUTH TWICE DAILY WITH FOOD  180 tablet  0  . omeprazole (PRILOSEC) 20 MG capsule TAKE ONE CAPSULE BY MOUTH TWICE DAILY  60 capsule  11  . potassium chloride (K-DUR) 10  MEQ tablet Take 2 tablets (20 mEq total) by mouth daily.  180 tablet  3  . predniSONE (DELTASONE) 20 MG tablet Take two tablets daily for 3 days then take one tablet daily for 3 days  9 tablet  0  . tiotropium (SPIRIVA) 18 MCG inhalation capsule Place 18 mcg into inhaler and inhale daily as needed.        Marland Kitchen tiZANidine (ZANAFLEX) 2 MG tablet TAKE 1-2 TABLETS BY MOUTH THREE TIMES A DAY AS NEEDED   180 tablet  0  . traZODone (DESYREL) 100 MG tablet Take 1/2 tablet by mouth every morning and take 2 tablets by mouth at bedtime  75 tablet  11   No current facility-administered medications on file prior to visit.    Allergies  Allergen Reactions  . Penicillins Swelling    tongue and throat swelling tolerates cephalosporins  . Rosiglitazone Maleate Swelling    Past Medical History  Diagnosis Date  . Depression   . Goiter, nodular     multi  . Dyslipidemia   . NIDDM, uncontrolled, with neuropathy   . Hypertension   . Allergy   .  GERD (gastroesophageal reflux disease)   . Urinary incontinence   . Peripheral neuropathy   . Multiple sclerosis   . Breast cancer   . Hypothyroidism   . Asthma   . COPD (chronic obstructive pulmonary disease)   . DVT (deep venous thrombosis) 1990's    right leg  . Gout     Past Surgical History  Procedure Laterality Date  . Thyroidectomy  09/2004  . Transthoracic echocardiogram  05/16/2004  . Partial hysterectomy  1975  . Axillary hidradenitis excision  1993    Excision biopsy growth right axilla, benign   . Other surgical history      Thyroid biopsy 10/99  . Carpal tunnel release  03/2008    bilateral  . Mastectomy, radical  02/2009    left modified  . Tonsillectomy    . Appendectomy      Family History  Problem Relation Age of Onset  . Kidney failure Mother   . Heart disease Father 22  . Heart disease Brother   . Heart disease Brother     History   Social History  . Marital Status: Widowed    Spouse Name: N/A    Number of Children: 4  . Years of Education: N/A   Occupational History  . [employer] bondsman     Getting back to work now   Social History Main Topics  . Smoking status: Current Every Day Smoker -- 0.50 packs/day    Types: Cigarettes    Last Attempt to Quit: 11/11/2003  . Smokeless tobacco: Never Used  . Alcohol Use: No  . Drug Use: No  . Sexual Activity: Not on file   Other Topics Concern  . Not on file   Social History Narrative   Widowed 1999 then 2nd Marriage--2000. Widowed again 2009   Living with daughter now   Has living will   Daughter [name] is health care POA.   Would accept rescitation but no prolonged artificial ventilation   No feeding tube if cognitively unaware   Review of Systems No vomiting  Some diarrhea Appetite is okay     Objective:   Physical Exam  Constitutional: She appears well-developed. No distress.  Neck: Normal range of motion. Neck supple. No thyromegaly present.  Cardiovascular: Normal rate, regular  rhythm and normal heart sounds.  Exam reveals no gallop.  No murmur heard. Pulmonary/Chest: Effort normal. No respiratory distress. She has no wheezes. She has no rales.  Decreased breath sounds but clear  Musculoskeletal: She exhibits edema.  1-2+ non pitting edema  Lymphadenopathy:    She has no cervical adenopathy.          Assessment & Plan:

## 2013-07-06 NOTE — Assessment & Plan Note (Signed)
Permanent disability parking permit done

## 2013-07-06 NOTE — Assessment & Plan Note (Signed)
Doesn't seem to be CHF but may have interstitial fluid contributing to respiratory problems Will increase lasix

## 2013-07-11 ENCOUNTER — Ambulatory Visit: Payer: Self-pay | Admitting: Oncology

## 2013-07-20 ENCOUNTER — Other Ambulatory Visit: Payer: Self-pay | Admitting: Internal Medicine

## 2013-07-20 ENCOUNTER — Other Ambulatory Visit: Payer: Self-pay | Admitting: *Deleted

## 2013-07-20 NOTE — Telephone Encounter (Signed)
Faxed refill request from [facility] [place] for diazepam.

## 2013-07-21 MED ORDER — DIAZEPAM 5 MG PO TABS: ORAL_TABLET | ORAL | Status: DC

## 2013-07-21 NOTE — Telephone Encounter (Signed)
rx called into pharmacy

## 2013-07-21 NOTE — Telephone Encounter (Signed)
Okay #60 x 0 

## 2013-07-26 ENCOUNTER — Telehealth: Payer: Self-pay | Admitting: Internal Medicine

## 2013-07-26 DIAGNOSIS — M2550 Pain in unspecified joint: Secondary | ICD-10-CM

## 2013-07-26 NOTE — Telephone Encounter (Signed)
rheumatoid arthritis

## 2013-07-26 NOTE — Telephone Encounter (Signed)
Pt left v/m requesting prescription for diabetic shoes. Pt said she is going to get at [facility]. Spoke with Randi at [facility] and she will fax over a 4 pg form to be completed and faxed back. Pt can go for fitting of shoes and when [facility] receives completed form [facility] will order shoes. Pt voiced understanding. Pt said she is allowed 1 pair of diabetic shoes per year.

## 2013-07-26 NOTE — Telephone Encounter (Signed)
Pt called in and is needing a referral to an RA dr. Evaristo Bury you send the referral w/out her coming in or does she need to make an apptmt? Thank you.

## 2013-07-26 NOTE — Telephone Encounter (Signed)
Spoke with patient and she states Dr.Crisp at the pain center suggest a rheumatologist because per pt her pain is out of control.

## 2013-07-27 DIAGNOSIS — M2550 Pain in unspecified joint: Secondary | ICD-10-CM | POA: Insufficient documentation

## 2013-07-27 NOTE — Telephone Encounter (Signed)
Spoke with patient and advised results   

## 2013-07-27 NOTE — Telephone Encounter (Signed)
Form for diabetic shoes in Dr Karle Starch in box.

## 2013-07-27 NOTE — Telephone Encounter (Signed)
Let her know we will set up an evaluation with Dr Rowe Pavy rheumatologist

## 2013-07-27 NOTE — Telephone Encounter (Signed)
Form done 

## 2013-07-28 NOTE — Telephone Encounter (Signed)
Form faxed back and scanned 

## 2013-08-04 ENCOUNTER — Telehealth: Payer: Self-pay

## 2013-08-04 NOTE — Telephone Encounter (Signed)
Madeline [name] with [facility] left v/m; Madeline [name] received form for diabetic shoes but needs chart notes explaining why diabetic shoes are being prescribed; addendum would be OK.Please advise.

## 2013-08-04 NOTE — Telephone Encounter (Signed)
I addended the 09/27/12 note  Please send that to them

## 2013-08-04 NOTE — Telephone Encounter (Signed)
Note faxed to [facility]

## 2013-08-05 ENCOUNTER — Telehealth: Payer: Self-pay

## 2013-08-05 NOTE — Telephone Encounter (Signed)
Madeline [name] with [facility] left v/m requesting cb to confirm medication review fax received on 08/04/13. If fax has not been received call Madeline [name] to have fax resent if fax received request Dr Alphonsus Sias to review, accept or deny recommendations and sign fax and send back to [facility].

## 2013-08-09 NOTE — Telephone Encounter (Signed)
Dr.Letvak did you receive this?

## 2013-08-10 ENCOUNTER — Ambulatory Visit: Payer: Self-pay | Admitting: Oncology

## 2013-08-10 NOTE — Telephone Encounter (Signed)
I did receive this

## 2013-08-11 ENCOUNTER — Ambulatory Visit: Payer: Self-pay | Admitting: Pain Medicine

## 2013-08-11 ENCOUNTER — Other Ambulatory Visit: Payer: Self-pay | Admitting: Internal Medicine

## 2013-08-11 NOTE — Telephone Encounter (Signed)
Okay to refill for a year 

## 2013-08-11 NOTE — Telephone Encounter (Signed)
Last filled 07/20/13

## 2013-08-11 NOTE — Telephone Encounter (Signed)
Okay #60 x 0 Let her know she is a week early I cannot fill it early again next week

## 2013-08-12 NOTE — Telephone Encounter (Signed)
rx called into pharmacy

## 2013-08-12 NOTE — Telephone Encounter (Signed)
rx sent to pharmacy by e-script  

## 2013-08-22 ENCOUNTER — Ambulatory Visit: Payer: Self-pay | Admitting: Pain Medicine

## 2013-08-29 ENCOUNTER — Encounter: Payer: Self-pay | Admitting: Internal Medicine

## 2013-08-29 ENCOUNTER — Ambulatory Visit (INDEPENDENT_AMBULATORY_CARE_PROVIDER_SITE_OTHER): Payer: Medicare Other | Admitting: Internal Medicine

## 2013-08-29 ENCOUNTER — Telehealth: Payer: Self-pay

## 2013-08-29 ENCOUNTER — Emergency Department: Payer: Self-pay | Admitting: Emergency Medicine

## 2013-08-29 VITALS — BP 120/70 | HR 74 | Temp 98.1°F | Wt 259.0 lb

## 2013-08-29 DIAGNOSIS — M2550 Pain in unspecified joint: Secondary | ICD-10-CM

## 2013-08-29 LAB — COMPREHENSIVE METABOLIC PANEL
Albumin: 3.4 g/dL (ref 3.4–5.0)
Alkaline Phosphatase: 119 U/L (ref 50–136)
Anion Gap: 5 — ABNORMAL LOW (ref 7–16)
BUN: 21 mg/dL — ABNORMAL HIGH (ref 7–18)
Bilirubin,Total: 0.3 mg/dL (ref 0.2–1.0)
Calcium, Total: 9.1 mg/dL (ref 8.5–10.1)
Chloride: 98 mmol/L (ref 98–107)
EGFR (African American): >60
Glucose: 173 mg/dL — ABNORMAL HIGH (ref 65–99)
Osmolality: 275 (ref 275–301)
Potassium: 4 mmol/L (ref 3.5–5.1)
SGOT(AST): 52 U/L — ABNORMAL HIGH (ref 15–37)
SGPT (ALT): 24 U/L (ref 12–78)
Sodium: 134 mmol/L — ABNORMAL LOW (ref 136–145)
Total Protein: 8 g/dL (ref 6.4–8.2)

## 2013-08-29 LAB — CBC
HCT: 38.4 % (ref 35.0–47.0)
MCHC: 32.9 g/dL (ref 32.0–36.0)
MCV: 81 fL (ref 80–100)
Platelet: 269 10*3/uL (ref 150–440)
RDW: 16.1 % — ABNORMAL HIGH (ref 11.5–14.5)
WBC: 11.2 10*3/uL — ABNORMAL HIGH (ref 3.6–11.0)

## 2013-08-29 LAB — URINALYSIS, COMPLETE
Blood: NEGATIVE
Glucose,UR: NEGATIVE mg/dL (ref 0–75)
Ketone: NEGATIVE
Nitrite: NEGATIVE
Protein: NEGATIVE
Specific Gravity: 1.017 (ref 1.003–1.030)
Squamous Epithelial: 1
WBC UR: 7 /HPF (ref 0–5)

## 2013-08-29 LAB — SEDIMENTATION RATE: Erythrocyte Sed Rate: 36 mm/hr — ABNORMAL HIGH (ref 0–30)

## 2013-08-29 LAB — CK TOTAL AND CKMB (NOT AT ARMC)
CK, Total: 135 U/L (ref 21–215)
CK-MB: 1.4 ng/mL (ref 0.5–3.6)

## 2013-08-29 LAB — TROPONIN I: Troponin-I: <0.02 ng/mL

## 2013-08-29 NOTE — Patient Instructions (Signed)
Please try off the letrozole. If your pain is much better, stay off it and we will need Dr Orlie Dakin to review your alternatives. If the pain isn't better, go back on the letrozole and restart the meloxicam as well (let me know if you need a refill).

## 2013-08-29 NOTE — Telephone Encounter (Signed)
Will evaluate then 

## 2013-08-29 NOTE — Telephone Encounter (Signed)
Pt having cramping all over body, seems worse in stomach, legs and lt shoulder, dizziness on and off. Pt said has had symptoms for a while but has gotten worse. No CP, H/A or SOB. Pt scheduled appt to see Dr Alphonsus Sias today at 11:15 am.

## 2013-08-29 NOTE — Assessment & Plan Note (Signed)
And myalgia Not on statin ?PMR---- Dr Kirtland Bouchard did do inflammatory markers but I haven't seen the results yet ?? From letrozole? Worse off the meloxicam  Will have her hold letrozole for now (unless alternate diagnosis from Dr Gavin Potters) Copy to Dr Orlie Dakin

## 2013-08-29 NOTE — Progress Notes (Signed)
Subjective:    Patient ID: Madeline [name], female    DOB: 12/26/1950, 62 y.o.   MRN: [id number]  HPI Here due to ongoing pain Pain will awaken her at night Needs more pain meds Getting cramps from head to toe--- up to the back of her neck Shoulders are aching Even gets pain in chest wall---makes it hard to breathe Pain in hips and legs with walking--feels like "bone on bone" in knees and hips Pain even to drive--cramps in legs and ankles  Reviewed Dr Guillermina City note Awaiting his blood work results  Current Outpatient Prescriptions on File Prior to Visit  Medication Sig Dispense Refill  . albuterol (PROAIR HFA) 108 (90 BASE) MCG/ACT inhaler INHALE TWO PUFFS BY MOUTH UP TO FOUR TIMES A DAY AS NEEDED  18 g  3  . allopurinol (ZYLOPRIM) 100 MG tablet TAKE ONE TABLET BY MOUTH ONE TIME DAILY  60 tablet  11  . aspirin 325 MG tablet Take 325 mg by mouth daily.        . budesonide-formoterol (SYMBICORT) 160-4.5 MCG/ACT inhaler Inhale 1 puff into the lungs 2 (two) times daily.  10.2 g  11  . Calcium Carbonate-Vitamin D (CALCIUM-VITAMIN D) 600-200 MG-UNIT CAPS Take by mouth daily.        . carvedilol (COREG) 25 MG tablet Take 1 tablet (25 mg total) by mouth 2 (two) times daily with a meal.  180 tablet  3  . diazepam (VALIUM) 5 MG tablet TAKE ONE-HALF TO ONE TABLET BY MOUTH TWICE DAILY AS NEEDED NERVES   60 tablet  0  . fish oil-omega-3 fatty acids 1000 MG capsule Take 2 g by mouth daily.        Marland Kitchen FLUoxetine (PROZAC) 20 MG capsule Take 1 capsule (20 mg total) by mouth daily.  30 capsule  11  . furosemide (LASIX) 80 MG tablet Take 1 tablet (80 mg total) by mouth daily.  60 tablet  11  . gabapentin (NEURONTIN) 300 MG capsule Take 8 tablets by mouth once daily      . glatiramer (COPAXONE) 20 MG/ML injection Inject 20 mg into the skin daily.        Marland Kitchen glucose blood (BAYER CONTOUR TEST) test strip 1 each by Other route 3 (three) times daily. Dx: 250.60  100 each  2  . insulin aspart (NOVOLOG FLEXPEN)  100 UNIT/ML injection Sliding scale  10 pen  1  . insulin glargine (LANTUS) 100 UNIT/ML injection Inject 0.47 mLs (47 Units total) into the skin 2 (two) times daily.  5 pen  1  . Insulin Pen Needle (BD ULTRA-FINE PEN NEEDLES) 29G X 12.7MM MISC Patient test blood sugar 3-4 times daily       . letrozole (FEMARA) 2.5 MG tablet Take 2.5 mg by mouth daily.        Marland Kitchen levothyroxine (SYNTHROID, LEVOTHROID) 175 MCG tablet Take 1 tablet (175 mcg total) by mouth daily.  90 tablet  3  . lidocaine (LIDODERM) 5 % Place 1-2 patches onto the skin every 12 (twelve) hours. then remove for 12 hours, repeat if tolerated  30 patch  0  . meloxicam (MOBIC) 15 MG tablet Take 15 mg by mouth daily as needed.      . metFORMIN (GLUCOPHAGE) 1000 MG tablet TAKE ONE TABLET BY MOUTH TWICE DAILY WITH FOOD  180 tablet  0  . NOVOLOG FLEXPEN 100 UNIT/ML SOPN FlexPen Inject as directed according to sliding scale  3 mL  11  . omeprazole (PRILOSEC) 20  MG capsule TAKE ONE CAPSULE BY MOUTH TWICE DAILY  60 capsule  11  . potassium chloride (K-DUR) 10 MEQ tablet Take 2 tablets (20 mEq total) by mouth daily.  180 tablet  3  . tiotropium (SPIRIVA) 18 MCG inhalation capsule Place 1 capsule (18 mcg total) into inhaler and inhale daily as needed.  30 capsule  11  . tiZANidine (ZANAFLEX) 2 MG tablet TAKE ONE OR TWO TABLETS BY MOUTH THREE TIMES DAILY AS NEEDED  180 tablet  3  . traZODone (DESYREL) 100 MG tablet Take 1/2 tablet by mouth in the morning and 2 tablets byMouth nightly at bedtime  75 tablet  6   No current facility-administered medications on file prior to visit.    Allergies  Allergen Reactions  . Penicillins Swelling    tongue and throat swelling tolerates cephalosporins  . Rosiglitazone Maleate Swelling    Past Medical History  Diagnosis Date  . Depression   . Goiter, nodular     multi  . Dyslipidemia   . NIDDM, uncontrolled, with neuropathy   . Hypertension   . Allergy   . GERD (gastroesophageal reflux disease)   .  Urinary incontinence   . Peripheral neuropathy   . Multiple sclerosis   . Breast cancer   . Hypothyroidism   . Asthma   . COPD (chronic obstructive pulmonary disease)   . DVT (deep venous thrombosis) 1990's    right leg  . Gout     Past Surgical History  Procedure Laterality Date  . Thyroidectomy  09/2004  . Transthoracic echocardiogram  05/16/2004  . Partial hysterectomy  1975  . Axillary hidradenitis excision  1993    Excision biopsy growth right axilla, benign   . Other surgical history      Thyroid biopsy 10/99  . Carpal tunnel release  03/2008    bilateral  . Mastectomy, radical  02/2009    left modified  . Tonsillectomy    . Appendectomy      Family History  Problem Relation Age of Onset  . Kidney failure Mother   . Heart disease Father 56  . Heart disease Brother   . Heart disease Brother     History   Social History  . Marital Status: Widowed    Spouse Name: N/A    Number of Children: 4  . Years of Education: N/A   Occupational History  . [employer] bondsman     Getting back to work now   Social History Main Topics  . Smoking status: Current Every Day Smoker -- 0.50 packs/day    Types: Cigarettes    Last Attempt to Quit: 11/11/2003  . Smokeless tobacco: Never Used  . Alcohol Use: No  . Drug Use: No  . Sexual Activity: Not on file   Other Topics Concern  . Not on file   Social History Narrative   Widowed 1999 then 2nd Marriage--2000. Widowed again 2009   Living with daughter now   Has living will   Daughter [name] is health care POA.   Would accept rescitation but no prolonged artificial ventilation   No feeding tube if cognitively unaware   Review of Systems Appetite is okay Weight stable     Objective:   Physical Exam  Constitutional: She appears well-developed. No distress.  Musculoskeletal:  Tender along shoulders and arms mostly No clear cut synovitis  Psychiatric: She has a normal mood and affect. Her behavior is normal.  Assessment & Plan:

## 2013-08-30 ENCOUNTER — Telehealth: Payer: Self-pay | Admitting: Family Medicine

## 2013-08-30 NOTE — Telephone Encounter (Signed)
Mellody Dance from [employer] left vm to confirm that Dr. Alphonsus Sias received medication review for pt (did not tell name of medication).  Asking to approve or deny and fax back.

## 2013-08-31 LAB — URINE CULTURE

## 2013-08-31 NOTE — Telephone Encounter (Signed)
I might have seen something without realizing I had to approve it officially but I don't remember. He will either have to give specifics of the question, or resend the form

## 2013-09-02 ENCOUNTER — Telehealth: Payer: Self-pay

## 2013-09-02 NOTE — Telephone Encounter (Signed)
Okay to send new Rx for 90 day supply of everything I prescribe Also okay to change diabetic supplies  Check if she uses the nebulizer---we just have albuterol MDI

## 2013-09-02 NOTE — Telephone Encounter (Signed)
Joni Reining pharmacist with [facility] left v/m; pt's nebulizer is broken and needs order for new nebulizer. Pt needs new order for diabetic testing supplies; in 2015 one touch ultra and accuchek will be covered. Pt is now using [employer] and as of 2015 that will not be covered by insurance. Also to be noted is for chronic non controlled medications pt can get # 90 day supply for the same cost as a # 30 day supply. Pt is presently using [facility]. Joni Reining faxed a request for nebulizer few weeks ago but has not received fax back.Please advise.(verified with pt's daughter pt has changed from [facility] to [facility].

## 2013-09-07 NOTE — Telephone Encounter (Signed)
.  left message to have patient return my call.  

## 2013-09-08 ENCOUNTER — Ambulatory Visit: Payer: Self-pay | Admitting: Pain Medicine

## 2013-09-08 NOTE — Telephone Encounter (Signed)
Madeline [name] with [employer] left v/m checking on status of med review form sent in 09/*2014.Please advise.

## 2013-09-10 ENCOUNTER — Ambulatory Visit: Payer: Self-pay | Admitting: Oncology

## 2013-09-14 NOTE — Telephone Encounter (Signed)
Ok to send rx for nebulizer? Use COPD dx code?

## 2013-09-14 NOTE — Telephone Encounter (Signed)
Yes, okay to order I wrote a prescription that you can fax

## 2013-09-14 NOTE — Telephone Encounter (Signed)
Pt thinks she got nebulizer from Dr Meredeth Ide at [facility] years ago; pt wants new neb order sent to [facility]. Pt wants diabetic supplies to start in 2015 and pt does not have preference of what glucose meter (wants the meter that takes the least amt of blood to test). And pt request cb when neb ordered.

## 2013-09-15 ENCOUNTER — Other Ambulatory Visit: Payer: Self-pay

## 2013-09-15 MED ORDER — NEBULIZER/TUBING/MOUTHPIECE KIT: PACK | Status: DC

## 2013-09-15 NOTE — Telephone Encounter (Signed)
Order sent to [facility]

## 2013-09-20 ENCOUNTER — Other Ambulatory Visit: Payer: Self-pay | Admitting: Family Medicine

## 2013-09-20 MED ORDER — MELOXICAM 15 MG PO TABS: 15.0000 mg | ORAL_TABLET | Freq: Every day | ORAL | Status: DC | PRN

## 2013-09-20 NOTE — Telephone Encounter (Signed)
rx sent to pharmacy by e-script  

## 2013-09-20 NOTE — Telephone Encounter (Signed)
Okay to refill for a year #90 x 3

## 2013-09-20 NOTE — Telephone Encounter (Signed)
Pt requesting 90 day supply be transferred to [facility] [address].

## 2013-09-28 ENCOUNTER — Telehealth: Payer: Self-pay

## 2013-09-28 ENCOUNTER — Other Ambulatory Visit: Payer: Self-pay | Admitting: Internal Medicine

## 2013-09-28 MED ORDER — GLUCOSE BLOOD VI STRP: ORAL_STRIP | Status: DC

## 2013-09-28 MED ORDER — ONETOUCH LANCETS MISC: Status: DC

## 2013-09-28 MED ORDER — ONETOUCH BASIC SYSTEM W/DEVICE KIT: PACK | Status: DC

## 2013-09-28 NOTE — Telephone Encounter (Signed)
rx sent to pharmacy by e-script Spoke with patient and advised rx sent to pharmacy

## 2013-09-28 NOTE — Telephone Encounter (Signed)
Pt saw Dr Lavenia Atlas and was told to drink tonic water for muscle spasms; pt said she drinks 24 oz of tonic water daily but no relief with muscle spasms. Pt wants to know how much tonic water she should drink to get relief or years ago quinine was called in for pt's muscle spasms. Pt does not plan to return to Dr Gavin Potters so she wants Dr Vassie Moselle advise of whether to continue tonic water and if so how much or should med be sent to [facility].Please advise. Pt request cb.

## 2013-09-28 NOTE — Telephone Encounter (Signed)
Pt said she has lost her glucose meter and request new meter, testing strips and lancets to [facility]. Pt said to go ahead and send in for meter that will be approved by ins. 11/2013. Pt request cb when meter and testing supplies sent to [facility].

## 2013-09-29 NOTE — Telephone Encounter (Signed)
Spoke with patient and advised results   

## 2013-09-29 NOTE — Telephone Encounter (Signed)
Quinine has been banned in pill form because it caused occasional deaths from too much. She should not use more tonic than she is drinking now (though not close to a dangerous level).  I have had some tell me a spoon full of mustard will prevent cramps Also, OTC magnesium pills may help

## 2013-09-30 ENCOUNTER — Other Ambulatory Visit: Payer: Self-pay | Admitting: *Deleted

## 2013-09-30 MED ORDER — LEVOTHYROXINE SODIUM 175 MCG PO TABS: 175.0000 ug | ORAL_TABLET | Freq: Every day | ORAL | Status: DC

## 2013-09-30 MED ORDER — FUROSEMIDE 80 MG PO TABS: 80.0000 mg | ORAL_TABLET | Freq: Every day | ORAL | Status: DC

## 2013-10-07 ENCOUNTER — Other Ambulatory Visit: Payer: Self-pay | Admitting: *Deleted

## 2013-10-07 MED ORDER — FUROSEMIDE 80 MG PO TABS: 80.0000 mg | ORAL_TABLET | Freq: Every day | ORAL | Status: DC

## 2013-10-07 MED ORDER — FLUOXETINE HCL 20 MG PO CAPS: ORAL_CAPSULE | ORAL | Status: DC

## 2013-10-07 MED ORDER — LEVOTHYROXINE SODIUM 175 MCG PO TABS: 175.0000 ug | ORAL_TABLET | Freq: Every day | ORAL | Status: DC

## 2013-10-07 NOTE — Telephone Encounter (Signed)
Okay #60 x 0 

## 2013-10-10 ENCOUNTER — Ambulatory Visit: Payer: Self-pay | Admitting: Pain Medicine

## 2013-10-10 MED ORDER — DIAZEPAM 5 MG PO TABS: ORAL_TABLET | ORAL | Status: DC

## 2013-10-10 NOTE — Telephone Encounter (Signed)
rx called into pharmacy

## 2013-10-11 ENCOUNTER — Ambulatory Visit: Payer: Self-pay | Admitting: Oncology

## 2013-10-13 NOTE — Telephone Encounter (Addendum)
[employer] called to ck on status of medical review form 08/04/13; request cb [phone].

## 2013-10-14 NOTE — Telephone Encounter (Signed)
Let them know she has a physical next month and I can review any preventative interventions at that time.  Did they have something pressing of concern?

## 2013-10-18 ENCOUNTER — Other Ambulatory Visit: Payer: Self-pay | Admitting: Internal Medicine

## 2013-10-18 MED ORDER — CARVEDILOL 25 MG PO TABS: 25.0000 mg | ORAL_TABLET | Freq: Two times a day (BID) | ORAL | Status: DC

## 2013-10-18 NOTE — Telephone Encounter (Signed)
rx denied to [facility], [facility] to [facility]

## 2013-10-20 NOTE — Telephone Encounter (Signed)
Spoke with [facility] and they will refax the form to my attention and I will get Dr. Alphonsus Sias to sign and i will fax back

## 2013-10-21 ENCOUNTER — Telehealth: Payer: Self-pay | Admitting: *Deleted

## 2013-10-21 NOTE — Telephone Encounter (Signed)
Please see form on your desk that needs to be filled out and faxed back, please advise

## 2013-10-21 NOTE — Telephone Encounter (Signed)
Form faxed back and scanned 

## 2013-10-21 NOTE — Telephone Encounter (Signed)
We are addressing the issues on the form signed

## 2013-10-31 ENCOUNTER — Other Ambulatory Visit: Payer: Self-pay | Admitting: *Deleted

## 2013-10-31 MED ORDER — POTASSIUM CHLORIDE ER 10 MEQ PO TBCR: 20.0000 meq | EXTENDED_RELEASE_TABLET | Freq: Every day | ORAL | Status: DC

## 2013-10-31 MED ORDER — TIOTROPIUM BROMIDE MONOHYDRATE 18 MCG IN CAPS: 18.0000 ug | ORAL_CAPSULE | Freq: Every day | RESPIRATORY_TRACT | Status: DC | PRN

## 2013-10-31 MED ORDER — FUROSEMIDE 80 MG PO TABS: 80.0000 mg | ORAL_TABLET | Freq: Every day | ORAL | Status: DC

## 2013-10-31 MED ORDER — INSULIN ASPART 100 UNIT/ML ~~LOC~~ SOLN: SUBCUTANEOUS | Status: DC

## 2013-10-31 MED ORDER — OMEPRAZOLE 20 MG PO CPDR: DELAYED_RELEASE_CAPSULE | ORAL | Status: DC

## 2013-10-31 NOTE — Telephone Encounter (Signed)
[facility] supply. Ok to do?

## 2013-11-01 MED ORDER — TRAZODONE HCL 100 MG PO TABS: ORAL_TABLET | ORAL | Status: DC

## 2013-11-01 NOTE — Telephone Encounter (Signed)
Okay to fill for a year 

## 2013-11-01 NOTE — Telephone Encounter (Signed)
rx sent to pharmacy by e-script  

## 2013-11-08 ENCOUNTER — Ambulatory Visit: Payer: Self-pay | Admitting: Pain Medicine

## 2013-11-09 ENCOUNTER — Other Ambulatory Visit: Payer: Self-pay | Admitting: Internal Medicine

## 2013-11-10 ENCOUNTER — Ambulatory Visit: Payer: Self-pay | Admitting: Oncology

## 2013-11-17 ENCOUNTER — Encounter: Payer: Self-pay | Admitting: Internal Medicine

## 2013-11-17 ENCOUNTER — Ambulatory Visit (INDEPENDENT_AMBULATORY_CARE_PROVIDER_SITE_OTHER): Payer: Medicare Other | Admitting: Internal Medicine

## 2013-11-17 VITALS — BP 140/70 | HR 77 | Temp 98.2°F | Wt 272.0 lb

## 2013-11-17 DIAGNOSIS — E785 Hyperlipidemia, unspecified: Secondary | ICD-10-CM

## 2013-11-17 DIAGNOSIS — J449 Chronic obstructive pulmonary disease, unspecified: Secondary | ICD-10-CM

## 2013-11-17 DIAGNOSIS — Z0000 Encounter for general adult medical examination without abnormal findings: Secondary | ICD-10-CM

## 2013-11-17 DIAGNOSIS — G35 Multiple sclerosis: Secondary | ICD-10-CM

## 2013-11-17 DIAGNOSIS — E1142 Type 2 diabetes mellitus with diabetic polyneuropathy: Secondary | ICD-10-CM

## 2013-11-17 DIAGNOSIS — E1149 Type 2 diabetes mellitus with other diabetic neurological complication: Secondary | ICD-10-CM

## 2013-11-17 DIAGNOSIS — J4489 Other specified chronic obstructive pulmonary disease: Secondary | ICD-10-CM

## 2013-11-17 DIAGNOSIS — E039 Hypothyroidism, unspecified: Secondary | ICD-10-CM

## 2013-11-17 DIAGNOSIS — I10 Essential (primary) hypertension: Secondary | ICD-10-CM

## 2013-11-17 DIAGNOSIS — R319 Hematuria, unspecified: Secondary | ICD-10-CM

## 2013-11-17 LAB — POCT URINALYSIS DIPSTICK
Bilirubin, UA: NEGATIVE
Blood, UA: NEGATIVE
GLUCOSE UA: NEGATIVE
KETONES UA: NEGATIVE
Nitrite, UA: NEGATIVE
Protein, UA: NEGATIVE
Spec Grav, UA: 1.01
Urobilinogen, UA: NEGATIVE
pH, UA: 6.5

## 2013-11-17 LAB — HM DIABETES FOOT EXAM

## 2013-11-17 MED ORDER — INSULIN PEN NEEDLE 30G X 5 MM MISC: Status: DC

## 2013-11-17 NOTE — Addendum Note (Signed)
Addended by: Despina Hidden on: 11/17/2013 04:27 PM   Modules accepted: Orders

## 2013-11-17 NOTE — Assessment & Plan Note (Signed)
BP Readings from Last 3 Encounters:  11/17/13 140/70  08/29/13 120/70  07/06/13 112/84   Good control

## 2013-11-17 NOTE — Assessment & Plan Note (Signed)
Stable on Rx.

## 2013-11-17 NOTE — Patient Instructions (Signed)
Please set up your diabetic eye exam.  Diabetes Meal Planning Guide The diabetes meal planning guide is a tool to help you plan your meals and snacks. It is important for people with diabetes to manage their blood glucose (sugar) levels. Choosing the right foods and the right amounts throughout your day will help control your blood glucose. Eating right can even help you improve your blood pressure and reach or maintain a healthy weight. CARBOHYDRATE COUNTING MADE EASY When you eat carbohydrates, they turn to sugar. This raises your blood glucose level. Counting carbohydrates can help you control this level so you feel better. When you plan your meals by counting carbohydrates, you can have more flexibility in what you eat and balance your medicine with your food intake. Carbohydrate counting simply means adding up the total amount of carbohydrate grams in your meals and snacks. Try to eat about the same amount at each meal. Foods with carbohydrates are listed below. Each portion below is 1 carbohydrate serving or 15 grams of carbohydrates. Ask your dietician how many grams of carbohydrates you should eat at each meal or snack. Grains and Starches  1 slice bread.   English muffin or hotdog/hamburger bun.   cup cold cereal (unsweetened).   cup cooked pasta or rice.   cup starchy vegetables (corn, potatoes, peas, beans, winter squash).  1 tortilla (6 inches).   bagel.  1 waffle or pancake (size of a CD).   cup cooked cereal.  4 to 6 small crackers. *Whole grain is recommended. Fruit  1 cup fresh unsweetened berries, melon, papaya, pineapple.  1 small fresh fruit.   banana or mango.   cup fruit juice (4 oz unsweetened).   cup canned fruit in natural juice or water.  2 tbs dried fruit.  12 to 15 grapes or cherries. Milk and Yogurt  1 cup fat-free or 1% milk.  1 cup soy milk.  6 oz light yogurt with sugar-free sweetener.  6 oz low-fat soy yogurt.  6 oz plain  yogurt. Vegetables  1 cup raw or  cup cooked is counted as 0 carbohydrates or a "free" food.  If you eat 3 or more servings at 1 meal, count them as 1 carbohydrate serving. Other Carbohydrates   oz chips or pretzels.   cup ice cream or frozen yogurt.   cup sherbet or sorbet.  2 inch square cake, no frosting.  1 tbs honey, sugar, jam, jelly, or syrup.  2 small cookies.  3 squares of graham crackers.  3 cups popcorn.  6 crackers.  1 cup broth-based soup.  Count 1 cup casserole or other mixed foods as 2 carbohydrate servings.  Foods with less than 20 calories in a serving may be counted as 0 carbohydrates or a "free" food. You may want to purchase a book or computer software that lists the carbohydrate gram counts of different foods. In addition, the nutrition facts panel on the labels of the foods you eat are a good source of this information. The label will tell you how big the serving size is and the total number of carbohydrate grams you will be eating per serving. Divide this number by 15 to obtain the number of carbohydrate servings in a portion. Remember, 1 carbohydrate serving equals 15 grams of carbohydrate. SERVING SIZES Measuring foods and serving sizes helps you make sure you are getting the right amount of food. The list below tells how big or small some common serving sizes are.  1 oz.........4 stacked dice.    3 oz........Marland KitchenDeck of cards.  1 tsp.......Marland KitchenTip of little finger.  1 tbs......Marland KitchenMarland KitchenThumb.  2 tbs.......Marland KitchenGolf ball.   cup......Marland KitchenHalf of a fist.  1 cup.......Marland KitchenA fist. SAMPLE DIABETES MEAL PLAN Below is a sample meal plan that includes foods from the grain and starches, dairy, vegetable, fruit, and meat groups. A dietician can individualize a meal plan to fit your calorie needs and tell you the number of servings needed from each food group. However, controlling the total amount of carbohydrates in your meal or snack is more important than making sure you  include all of the food groups at every meal. You may interchange carbohydrate containing foods (dairy, starches, and fruits). The meal plan below is an example of a 2000 calorie diet using carbohydrate counting. This meal plan has 17 carbohydrate servings. Breakfast  1 cup oatmeal (2 carb servings).   cup light yogurt (1 carb serving).  1 cup blueberries (1 carb serving).   cup almonds. Snack  1 large apple (2 carb servings).  1 low-fat string cheese stick. Lunch  Chicken breast salad.  1 cup spinach.   cup chopped tomatoes.  2 oz chicken breast, sliced.  2 tbs low-fat [place] dressing.  12 whole-wheat crackers (2 carb servings).  12 to 15 grapes (1 carb serving).  1 cup low-fat milk (1 carb serving). Snack  1 cup carrots.   cup hummus (1 carb serving). Dinner  3 oz broiled salmon.  1 cup brown rice (3 carb servings). Snack  1  cups steamed broccoli (1 carb serving) drizzled with 1 tsp olive oil and lemon juice.  1 cup light pudding (2 carb servings). DIABETES MEAL PLANNING WORKSHEET Your dietician can use this worksheet to help you decide how many servings of foods and what types of foods are right for you.  BREAKFAST Food Group and Servings / Carb Servings Grain/Starches __________________________________ Dairy __________________________________________ Vegetable ______________________________________ Fruit ___________________________________________ Meat __________________________________________ Fat ____________________________________________ LUNCH Food Group and Servings / Carb Servings Grain/Starches ___________________________________ Dairy ___________________________________________ Fruit ____________________________________________ Meat ___________________________________________ Fat _____________________________________________ Wonda Cheng Food Group and Servings / Carb Servings Grain/Starches ___________________________________ Dairy  ___________________________________________ Fruit ____________________________________________ Meat ___________________________________________ Fat _____________________________________________ SNACKS Food Group and Servings / Carb Servings Grain/Starches ___________________________________ Dairy ___________________________________________ Vegetable _______________________________________ Fruit ____________________________________________ Meat ___________________________________________ Fat _____________________________________________ DAILY TOTALS Starches _________________________ Vegetable ________________________ Fruit ____________________________ Dairy ____________________________ Meat ____________________________ Fat ______________________________ Document Released: 07/24/2005 Document Revised: 01/19/2012 Document Reviewed: 06/04/2009 [employer] Patient Information 2014 [employer].

## 2013-11-17 NOTE — Progress Notes (Signed)
Pre-visit discussion using our clinic review tool. No additional management support is needed unless otherwise documented below in the visit note.  

## 2013-11-17 NOTE — Progress Notes (Signed)
Subjective:    Patient ID: Madeline [name], female    DOB: June 10, 1951, 63 y.o.   MRN: [id number]  HPI Here for physical  Still with pain Off the letrozole and on another aromatase inhibitor  Allopurinol dose increased by Dr Jefm Bryant Percocet 4x/day from Dr Primus Bravo Gabapentin/lidoderm also  MS some worse also Still follows with her neurologist Wonders if she has pseudobulbar affect  Checks sugars 2-3 times per day Hasn't needed novolog recently (trying to be careful again) Feet are numb Sore on bottom of left great toe  Current Outpatient Prescriptions on File Prior to Visit  Medication Sig Dispense Refill  . albuterol (PROAIR HFA) 108 (90 BASE) MCG/ACT inhaler INHALE TWO PUFFS BY MOUTH UP TO FOUR TIMES A DAY AS NEEDED  18 g  3  . allopurinol (ZYLOPRIM) 100 MG tablet TAKE ONE TABLET BY MOUTH ONE TIME DAILY  60 tablet  11  . aspirin 325 MG tablet Take 325 mg by mouth daily.        . budesonide-formoterol (SYMBICORT) 160-4.5 MCG/ACT inhaler Inhale 1 puff into the lungs 2 (two) times daily.  10.2 g  11  . Calcium Carbonate-Vitamin D (CALCIUM-VITAMIN D) 600-200 MG-UNIT CAPS Take by mouth daily.        . carvedilol (COREG) 25 MG tablet Take 1 tablet (25 mg total) by mouth 2 (two) times daily with a meal.  180 tablet  3  . diazepam (VALIUM) 5 MG tablet TAKE ONE-HALF TO ONE TABLET BY MOUTH TWICE DAILY AS NEEDED NERVES  60 tablet  0  . fish oil-omega-3 fatty acids 1000 MG capsule Take 2 g by mouth daily.        Marland Kitchen FLUoxetine (PROZAC) 20 MG capsule TAKE 1 CAPSULE BY MOUTH EVERY DAY  90 capsule  3  . furosemide (LASIX) 80 MG tablet Take 1 tablet (80 mg total) by mouth daily.  180 tablet  0  . gabapentin (NEURONTIN) 300 MG capsule Take 8 tablets by mouth once daily      . glatiramer (COPAXONE) 20 MG/ML injection Inject 20 mg into the skin daily.        Marland Kitchen glucose blood (ONE TOUCH TEST STRIPS) test strip Use as instructed to test blood sugar 3 times daily dx: 250.60  300 each  3  . insulin  aspart (NOVOLOG FLEXPEN) 100 UNIT/ML injection Sliding scale  10 pen  3  . insulin glargine (LANTUS) 100 UNIT/ML injection Inject 0.47 mLs (47 Units total) into the skin 2 (two) times daily.  5 pen  1  . levothyroxine (SYNTHROID, LEVOTHROID) 175 MCG tablet Take 1 tablet (175 mcg total) by mouth daily.  90 tablet  0  . lidocaine (LIDODERM) 5 % Place 1-2 patches onto the skin every 12 (twelve) hours. then remove for 12 hours, repeat if tolerated  30 patch  0  . meloxicam (MOBIC) 15 MG tablet Take 1 tablet (15 mg total) by mouth daily as needed.  90 tablet  3  . metFORMIN (GLUCOPHAGE) 1000 MG tablet TAKE ONE TABLET BY MOUTH TWICE DAILY WITH FOOD  180 tablet  0  . omeprazole (PRILOSEC) 20 MG capsule TAKE ONE CAPSULE BY MOUTH TWICE DAILY  180 capsule  1  . ONE TOUCH LANCETS MISC Pt uses to test blood sugar 3 times daily dx 250.60  200 each  3  . oxyCODONE (OXY IR/ROXICODONE) 5 MG immediate release tablet Take 5 mg by mouth every 8 (eight) hours as needed.       Marland Kitchen  potassium chloride (K-DUR) 10 MEQ tablet Take 2 tablets (20 mEq total) by mouth daily.  180 tablet  1  . Respiratory Therapy Supplies (NEBULIZER/TUBING/MOUTHPIECE) KIT Use as directed dx: 496  1 each  0  . tiotropium (SPIRIVA) 18 MCG inhalation capsule Place 1 capsule (18 mcg total) into inhaler and inhale daily as needed.  90 capsule  1  . tiZANidine (ZANAFLEX) 2 MG tablet TAKE 1 TO 2 TABLETS BY MOUTH 3 TIMES A DAY AS NEEDED  180 tablet  1  . traZODone (DESYREL) 100 MG tablet Take 1/2 tab in the morning and 2 tabs at bedtime  225 tablet  3   No current facility-administered medications on file prior to visit.    Allergies  Allergen Reactions  . Penicillins Swelling    tongue and throat swelling tolerates cephalosporins  . Rosiglitazone Maleate Swelling    Past Medical History  Diagnosis Date  . Depression   . Goiter, nodular     multi  . Dyslipidemia   . NIDDM, uncontrolled, with neuropathy   . Hypertension   . Allergy   .  GERD (gastroesophageal reflux disease)   . Urinary incontinence   . Peripheral neuropathy   . Multiple sclerosis   . Breast cancer   . Hypothyroidism   . Asthma   . COPD (chronic obstructive pulmonary disease)   . DVT (deep venous thrombosis) 1990's    right leg  . Gout     Past Surgical History  Procedure Laterality Date  . Thyroidectomy  09/2004  . Transthoracic echocardiogram  05/16/2004  . Partial hysterectomy  1975  . Axillary hidradenitis excision  1993    Excision biopsy growth right axilla, benign   . Other surgical history      Thyroid biopsy 10/99  . Carpal tunnel release  03/2008    bilateral  . Mastectomy, radical  02/2009    left modified  . Tonsillectomy    . Appendectomy      Family History  Problem Relation Age of Onset  . Kidney failure Mother   . Heart disease Father 56  . Heart disease Brother   . Heart disease Brother     History   Social History  . Marital Status: Widowed    Spouse Name: N/A    Number of Children: 4  . 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 Smoking status: Current Every Day Smoker -- 0.50 packs/day    Types: Cigarettes  . Smokeless tobacco: Never Used  . Alcohol Use: No  . Drug Use: No  . Sexual Activity: Not on file   Other Topics Concern  . Not on file   Social History Narrative   Widowed 1999 then 2nd Marriage--2000. Widowed again 2009   Living with daughter now   Has living will   Daughter [name] is health care POA.   Would accept rescitation but no prolonged artificial ventilation   No feeding tube if cognitively unaware   Review of Systems  Constitutional: Positive for unexpected weight change. Negative for fatigue.       Weight up at Christmas and quit smoking Wears seat belt  HENT: Positive for hearing loss and tinnitus. Negative for dental problem.        Wears upper dentures-not lowers  Eyes: Negative for  visual disturbance.       No new problems  Respiratory:  Positive for cough and shortness of breath.   Cardiovascular: Positive for chest pain, palpitations and leg swelling.       Thinks it may be related to mastectomy  Gastrointestinal: Positive for abdominal pain and blood in stool.       Thinks her dark stools are blood--she declines any colon testing  Endocrine: Positive for cold intolerance and heat intolerance.       Hot weather affects MS and breathing Cold affects her joints  Genitourinary: Positive for dysuria and hematuria.       Has brown urine at times  Musculoskeletal: Positive for arthralgias and back pain. Negative for joint swelling.  Skin: Negative for rash.       Red spots on right arm and other areas---scattered  Allergic/Immunologic: Positive for environmental allergies. Negative for immunocompromised state.       Benedryl helps  Neurological: Positive for dizziness, syncope, weakness and numbness. Negative for headaches.       Passes out due to MS and stress  Psychiatric/Behavioral: Positive for sleep disturbance. Negative for dysphoric mood.       Objective:   Physical Exam  Constitutional: She is oriented to person, place, and time. She appears well-developed and well-nourished. No distress.  Neck: Normal range of motion. Neck supple. No thyromegaly present.  Cardiovascular: Normal rate, regular rhythm, normal heart sounds and intact distal pulses.  Exam reveals no gallop.   No murmur heard. Pulmonary/Chest: Effort normal and breath sounds normal. No respiratory distress. She has no wheezes. She has no rales.  Abdominal: Soft. There is no tenderness.  Musculoskeletal: She exhibits edema.  Thick calves without pitting  Lymphadenopathy:    She has no cervical adenopathy.  Neurological: She is alert and oriented to person, place, and time.  Psychiatric: She has a normal mood and affect. Her behavior is normal.          Assessment & Plan:

## 2013-11-17 NOTE — Assessment & Plan Note (Signed)
On gabapentin for this and other pain syndromes

## 2013-11-17 NOTE — Assessment & Plan Note (Signed)
Hopefully still good control Will check labs 

## 2013-11-17 NOTE — Assessment & Plan Note (Signed)
Follows with neurologist On copaxone

## 2013-11-17 NOTE — Assessment & Plan Note (Signed)
Overwhelming problems but tries to go on I really recommend colonoscopy but she refuses Keeps up with oncologist for breast cancer

## 2013-11-18 ENCOUNTER — Telehealth: Payer: Self-pay | Admitting: Internal Medicine

## 2013-11-18 ENCOUNTER — Encounter: Payer: Self-pay | Admitting: *Deleted

## 2013-11-18 LAB — CBC WITH DIFFERENTIAL/PLATELET
Basophils Absolute: 0.1 10*3/uL (ref 0.0–0.1)
Basophils Relative: 0.8 % (ref 0.0–3.0)
Eosinophils Absolute: 0.5 10*3/uL (ref 0.0–0.7)
Eosinophils Relative: 4.5 % (ref 0.0–5.0)
HEMATOCRIT: 40 % (ref 36.0–46.0)
Hemoglobin: 12.7 g/dL (ref 12.0–15.0)
LYMPHS ABS: 1.8 10*3/uL (ref 0.7–4.0)
Lymphocytes Relative: 16.7 % (ref 12.0–46.0)
MCHC: 31.8 g/dL (ref 30.0–36.0)
MCV: 80.4 fl (ref 78.0–100.0)
Monocytes Absolute: 1.1 10*3/uL — ABNORMAL HIGH (ref 0.1–1.0)
Monocytes Relative: 10.1 % (ref 3.0–12.0)
Neutro Abs: 7.3 10*3/uL (ref 1.4–7.7)
Neutrophils Relative %: 67.9 % (ref 43.0–77.0)
Platelets: 255 10*3/uL (ref 150.0–400.0)
RBC: 4.98 Mil/uL (ref 3.87–5.11)
RDW: 17.7 % — ABNORMAL HIGH (ref 11.5–14.6)
WBC: 10.8 10*3/uL — AB (ref 4.5–10.5)

## 2013-11-18 LAB — HEPATIC FUNCTION PANEL
ALBUMIN: 3.8 g/dL (ref 3.5–5.2)
ALT: 17 U/L (ref 0–35)
AST: 24 U/L (ref 0–37)
Alkaline Phosphatase: 80 U/L (ref 39–117)
BILIRUBIN TOTAL: 0.6 mg/dL (ref 0.3–1.2)
Bilirubin, Direct: 0.1 mg/dL (ref 0.0–0.3)
Total Protein: 7.6 g/dL (ref 6.0–8.3)

## 2013-11-18 LAB — LIPID PANEL
CHOL/HDL RATIO: 6
CHOLESTEROL: 222 mg/dL — AB (ref 0–200)
HDL: 39.3 mg/dL (ref >39.00)
TRIGLYCERIDES: 220 mg/dL — AB (ref 0.0–149.0)
VLDL: 44 mg/dL — ABNORMAL HIGH (ref 0.0–40.0)

## 2013-11-18 LAB — BASIC METABOLIC PANEL
BUN: 15 mg/dL (ref 6–23)
CHLORIDE: 101 meq/L (ref 96–112)
CO2: 29 mEq/L (ref 19–32)
CREATININE: 0.9 mg/dL (ref 0.4–1.2)
Calcium: 9.3 mg/dL (ref 8.4–10.5)
GFR: 65.95 mL/min (ref >60.00)
Glucose, Bld: 148 mg/dL — ABNORMAL HIGH (ref 70–99)
Potassium: 4.1 mEq/L (ref 3.5–5.1)
Sodium: 141 mEq/L (ref 135–145)

## 2013-11-18 LAB — T4, FREE: Free T4: 0.97 ng/dL (ref 0.60–1.60)

## 2013-11-18 LAB — MICROALBUMIN / CREATININE URINE RATIO
Creatinine,U: 30.8 mg/dL
Microalb Creat Ratio: 0.3 mg/g (ref 0.0–30.0)
Microalb, Ur: 0.1 mg/dL (ref 0.0–1.9)

## 2013-11-18 LAB — TSH: TSH: 2.79 u[IU]/mL (ref 0.35–5.50)

## 2013-11-18 LAB — HEMOGLOBIN A1C: Hgb A1c MFr Bld: 7.8 % — ABNORMAL HIGH (ref 4.6–6.5)

## 2013-11-18 LAB — LDL CHOLESTEROL, DIRECT: LDL DIRECT: 140.7 mg/dL

## 2013-11-18 NOTE — Telephone Encounter (Signed)
Relevant patient education assigned to patient using Emmi. ° °

## 2013-11-21 ENCOUNTER — Ambulatory Visit: Payer: Self-pay | Admitting: Pain Medicine

## 2013-11-24 ENCOUNTER — Other Ambulatory Visit: Payer: Self-pay | Admitting: Internal Medicine

## 2013-11-25 ENCOUNTER — Other Ambulatory Visit: Payer: Self-pay | Admitting: *Deleted

## 2013-11-25 MED ORDER — METFORMIN HCL 1000 MG PO TABS: ORAL_TABLET | ORAL | Status: DC

## 2013-12-08 ENCOUNTER — Ambulatory Visit: Payer: Self-pay | Admitting: Pain Medicine

## 2013-12-11 ENCOUNTER — Ambulatory Visit: Payer: Self-pay | Admitting: Oncology

## 2013-12-16 ENCOUNTER — Ambulatory Visit (INDEPENDENT_AMBULATORY_CARE_PROVIDER_SITE_OTHER): Payer: Medicare Other | Admitting: Nurse Practitioner

## 2013-12-16 ENCOUNTER — Encounter: Payer: Self-pay | Admitting: Nurse Practitioner

## 2013-12-16 VITALS — BP 108/65 | HR 80 | Ht 64.0 in | Wt 281.0 lb

## 2013-12-16 DIAGNOSIS — R209 Unspecified disturbances of skin sensation: Secondary | ICD-10-CM | POA: Insufficient documentation

## 2013-12-16 DIAGNOSIS — R269 Unspecified abnormalities of gait and mobility: Secondary | ICD-10-CM | POA: Insufficient documentation

## 2013-12-16 DIAGNOSIS — G35 Multiple sclerosis: Secondary | ICD-10-CM

## 2013-12-16 DIAGNOSIS — R413 Other amnesia: Secondary | ICD-10-CM | POA: Insufficient documentation

## 2013-12-16 NOTE — Progress Notes (Signed)
I have read the note, and I agree with the clinical assessment and plan.  Madeline [name],Madeline [name]   

## 2013-12-16 NOTE — Patient Instructions (Signed)
Continue Copaxone 3 times weekly MRI of the brain stable from previous in 2009 Try Magnesium 400mg  OTC for cramps F/U in 1 year

## 2013-12-16 NOTE — Progress Notes (Signed)
[facility]  PATIENT: Madeline [name] DOB: 26-Nov-1950   REASON FOR VISIT: follow up for MS    HISTORY OF PRESENT ILLNESS: Madeline [name], 63 year old female returns for followup. She has a history of multiple sclerosis and was last seen in the office by Madeline [name] 2014. She had repeat MRI of the brain in May 2014 which was without change from her previous MRI in 2009. She has multiple medical problems to include insulin-dependent diabetes which is not in good control, history of peripheral neuropathy related to her diabetes, chronic pain syndrome treated at the pain clinic, and osteoarthritis. She also has COPD significant, peripheral edema ,gout and hypertension and morbid obesity. He is currently on Copaxone 40 mg 3 times weekly. She complains with some muscle cramping. She has been told to take tonic H20. She returns for reevaluation.   HISTORY: 63 year old right-handed white female with a history of morbid obesity, diabetes, and multiple sclerosis. The patient has been on Copaxone for number of years, and she is tolerating the medication well. Her last MRI of the brain was done in 2009. The patient was last seen through this office in December of 2011. The patient indicates that she has not had any new deficits with numbness or weakness of the face, arms, or legs. The patient denies any problems controlling the bowels or the bladder, and she denies any significant balance issues. The patient does have chronic low back pain, and she is followed through a pain Center. The patient has not had any visual complaints. The patient did have a fall while dancing in December 2013. The patient did not sustain significant injury. The patient broke her left leg in 2012, and this required a prolonged recovery period. The patient recently was treated for an ulcer on her left big toe. The patient returns for an evaluation.   REVIEW OF SYSTEMS: Full 14 system review of systems performed and  notable only for those listed, all others are neg:  Constitutional: Fatigue  Cardiovascular:  Leg swelling, palpitations Ear/Nose/Throat: Hearing loss Skin: N/A  Eyes: N/A  Respiratory: shortness of breath Gastroitestinal: N/A Genitourinary: Incontinence of bladder  Hematology/Lymphatic: Easy bruising  Endocrine: N/A Musculoskeletal: Joint pain, joint swelling, muscle cramps, chronic pain Allergy/Immunology: N/A  Neurological: Weakness  Psychiatric: Depression anxiety  ALLERGIES: Allergies  Allergen Reactions  . Penicillins Swelling    tongue and throat swelling tolerates cephalosporins  . Rosiglitazone Maleate Swelling    HOME MEDICATIONS: Outpatient Prescriptions Prior to Visit  Medication Sig Dispense Refill  . albuterol (PROAIR HFA) 108 (90 BASE) MCG/ACT inhaler INHALE TWO PUFFS BY MOUTH UP TO FOUR TIMES A DAY AS NEEDED  18 g  3  . aspirin 325 MG tablet Take 325 mg by mouth daily.        . budesonide-formoterol (SYMBICORT) 160-4.5 MCG/ACT inhaler Inhale 1 puff into the lungs 2 (two) times daily.  10.2 g  11  . Calcium Carbonate-Vitamin D (CALCIUM-VITAMIN D) 600-200 MG-UNIT CAPS Take by mouth daily.        . carvedilol (COREG) 25 MG tablet Take 1 tablet (25 mg total) by mouth 2 (two) times daily with a meal.  180 tablet  3  . diazepam (VALIUM) 5 MG tablet TAKE ONE-HALF TO ONE TABLET BY MOUTH TWICE DAILY AS NEEDED NERVES  60 tablet  0  . fish oil-omega-3 fatty acids 1000 MG capsule Take 2 g by mouth daily.        . furosemide (LASIX) 80 MG tablet Take  1 tablet (80 mg total) by mouth daily.  180 tablet  0  . gabapentin (NEURONTIN) 300 MG capsule Take 8 tablets by mouth once daily      . glatiramer (COPAXONE) 20 MG/ML injection Inject 40 mg into the skin 3 (three) times daily.       Marland Kitchen glucose blood (ONE TOUCH TEST STRIPS) test strip Use as instructed to test blood sugar 3 times daily dx: 250.60  300 each  3  . insulin aspart (NOVOLOG FLEXPEN) 100 UNIT/ML injection Sliding scale   10 pen  3  . insulin glargine (LANTUS) 100 UNIT/ML injection Inject 0.47 mLs (47 Units total) into the skin 2 (two) times daily.  5 pen  1  . Insulin Pen Needle 30G X 5 MM MISC Use as instructed to inject insulin 2-3 times daily dx 250.60  300 each  6  . KLOR-CON M10 10 MEQ tablet TAKE 2 TABLETS BY MOUTH DAILY  60 tablet  0  . levothyroxine (SYNTHROID, LEVOTHROID) 175 MCG tablet Take 1 tablet (175 mcg total) by mouth daily.  90 tablet  0  . lidocaine (LIDODERM) 5 % Place 1-2 patches onto the skin every 12 (twelve) hours. then remove for 12 hours, repeat if tolerated  30 patch  0  . meloxicam (MOBIC) 15 MG tablet Take 1 tablet (15 mg total) by mouth daily as needed.  90 tablet  3  . metFORMIN (GLUCOPHAGE) 1000 MG tablet TAKE ONE TABLET BY MOUTH TWICE DAILY WITH FOOD  180 tablet  3  . omeprazole (PRILOSEC) 20 MG capsule TAKE ONE CAPSULE BY MOUTH TWICE DAILY  180 capsule  1  . ONE TOUCH LANCETS MISC Pt uses to test blood sugar 3 times daily dx 250.60  200 each  3  . oxyCODONE (OXY IR/ROXICODONE) 5 MG immediate release tablet Take 5 mg by mouth every 8 (eight) hours as needed.       . potassium chloride (K-DUR) 10 MEQ tablet Take 2 tablets (20 mEq total) by mouth daily.  180 tablet  1  . Respiratory Therapy Supplies (NEBULIZER/TUBING/MOUTHPIECE) KIT Use as directed dx: 496  1 each  0  . tiotropium (SPIRIVA) 18 MCG inhalation capsule Place 1 capsule (18 mcg total) into inhaler and inhale daily as needed.  90 capsule  1  . tiZANidine (ZANAFLEX) 2 MG tablet TAKE 1 TO 2 TABLETS BY MOUTH 3 TIMES A DAY AS NEEDED  180 tablet  1  . traZODone (DESYREL) 100 MG tablet Take 1/2 tab in the morning and 2 tabs at bedtime  225 tablet  3  . allopurinol (ZYLOPRIM) 100 MG tablet TAKE ONE TABLET BY MOUTH ONE TIME DAILY  60 tablet  11  . FLUoxetine (PROZAC) 20 MG capsule TAKE 1 CAPSULE BY MOUTH EVERY DAY  90 capsule  3   No facility-administered medications prior to visit.    PAST MEDICAL HISTORY: Past Medical  History  Diagnosis Date  . Depression   . Goiter, nodular     multi  . Dyslipidemia   . NIDDM, uncontrolled, with neuropathy   . Hypertension   . Allergy   . GERD (gastroesophageal reflux disease)   . Urinary incontinence   . Peripheral neuropathy   . Multiple sclerosis   . Breast cancer   . Hypothyroidism   . Asthma   . COPD (chronic obstructive pulmonary disease)   . DVT (deep venous thrombosis) 1990's    right leg  . Gout     PAST SURGICAL HISTORY: Past  Surgical History  Procedure Laterality Date  . Thyroidectomy  09/2004  . Transthoracic echocardiogram  05/16/2004  . Partial hysterectomy  1975  . Axillary hidradenitis excision  1993    Excision biopsy growth right axilla, benign   . Other surgical history      Thyroid biopsy 10/99  . Carpal tunnel release  03/2008    bilateral  . Mastectomy, radical  02/2009    left modified  . Tonsillectomy    . Appendectomy      FAMILY HISTORY: Family History  Problem Relation Age of Onset  . Kidney failure Mother   . Heart disease Father 59  . Heart disease Brother   . Heart disease Brother     SOCIAL HISTORY: History   Social History  . Marital Status: Widowed    Spouse Name: N/A    Number of Children: 4  . 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 Smoking status: Former Smoker -- 0.50 packs/day    Types: Cigarettes    Quit date: 11/06/2013  . Smokeless tobacco: Never Used  . Alcohol Use: No  . Drug Use: No  . Sexual Activity: Not on file   Other Topics Concern  . Not on file   Social History Narrative   Widowed 1999 then 2nd Marriage--2000. Widowed again 2009   Living with daughter now   Has living will   Daughter [name] is health care POA.   Would accept rescitation but no prolonged artificial ventilation   No feeding tube if cognitively unaware     PHYSICAL EXAM  Filed Vitals:   12/16/13 1400  BP:  108/65  Pulse: 80  Height: $Remove'5\' 4"'CbqVCYz$  (1.626 m)  Weight: 281 lb (127.461 kg)   Body mass index is 48.21 kg/(m^2).  Generalized: Well developed, morbidly obese female in no acute distress  Head: normocephalic and atraumatic,. Oropharynx benign  Neck: Supple, no carotid bruits  Cardiac: Regular rate rhythm, no murmur  Musculoskeletal: No deformity  Skin 2-3+ edema in both lower legs  Neurological examination   Mentation: Alert oriented to time, place, history taking. Follows all commands speech and language fluent  Cranial nerve II-XII: Fundoscopic exam reveals sharp disc margins.visual acuity 20/50 right 20/70 left .Pupils were equal round reactive to light extraocular movements were full, visual field were full on confrontational test. Facial sensation and strength were normal. hearing was intact to finger rubbing bilaterally. Uvula tongue midline. head turning and shoulder shrug were normal and symmetric.Tongue protrusion into cheek strength was normal. Motor: normal bulk and tone, full strength in the BUE, BLE,  No focal weakness Coordination: finger-nose-finger, heel-to-shin bilaterally, no dysmetria Reflexes: Depressed upper and lower and symmetric Gait and Station: Rising up from seated position without assistance, wide based  stance,  moderate stride,  smooth turning, able to perform tiptoe, and heel walking without difficulty. Tandem gait is unsteady. Romberg is negative. Ambulates with single-point cane  DIAGNOSTIC DATA (LABS, IMAGING, TESTING) - I reviewed patient records, labs, notes, testing and imaging myself where available.  Lab Results  Component Value Date   WBC 10.8* 11/17/2013   HGB 12.7 11/17/2013   HCT 40.0 11/17/2013   MCV 80.4 11/17/2013   PLT 255.0 11/17/2013      Component Value Date/Time   NA 141 11/17/2013 1613   K 4.1 11/17/2013 1613   CL 101 11/17/2013 1613   CO2 29  11/17/2013 1613   GLUCOSE 148* 11/17/2013 1613   BUN 15 11/17/2013 1613   CREATININE 0.9 11/17/2013 1613    CALCIUM 9.3 11/17/2013 1613   PROT 7.6 11/17/2013 1613   ALBUMIN 3.8 11/17/2013 1613   AST 24 11/17/2013 1613   ALT 17 11/17/2013 1613   ALKPHOS 80 11/17/2013 1613   BILITOT 0.6 11/17/2013 1613   GFRNONAA 72.10 08/28/2010 1258   GFRAA 55 12/31/2009   Lab Results  Component Value Date   CHOL 222* 11/17/2013   HDL 39.30 11/17/2013   LDLCALC  Value: 121        Total Cholesterol/HDL:CHD Risk Coronary Heart Disease Risk Table                     Men   Women  1/2 Average Risk   3.4   3.3  Average Risk       5.0   4.4  2 X Average Risk   9.6   7.1  3 X Average Risk  23.4   11.0        Use the calculated Patient Ratio above and the CHD Risk Table to determine the patient's CHD Risk.        ATP III CLASSIFICATION (LDL):  <100     mg/dL   Optimal  100-129  mg/dL   Near or Above                    Optimal  130-159  mg/dL   Borderline  160-189  mg/dL   High  >190     mg/dL   Very High* 01/18/2009   LDLDIRECT 140.7 11/17/2013   TRIG 220.0* 11/17/2013   CHOLHDL 6 11/17/2013   Lab Results  Component Value Date   HGBA1C 7.8* 11/17/2013    Lab Results  Component Value Date   TSH 2.79 11/17/2013      ASSESSMENT AND PLAN  63 y.o. year old female  has a past medical history of Depression;  morbid obesity ,Dyslipidemia; NIDDM, uncontrolled, with neuropathy; Hypertension;  Urinary incontinence; Peripheral neuropathy; Multiple sclerosis; ; Hypothyroidism; Asthma; COPD (chronic obstructive pulmonary disease); DVT (deep venous thrombosis) (1990's); and Gout. here to followup for her multiple sclerosis. MRI of the brain in May 2014 with no changes as compared to 2009  Continue Copaxone 3 times weekly, reviewed recent labs Try Magnesium $RemoveBefor'400mg'nTTeAwVecdRP$  OTC for cramps F/U in 1 year Dennie Bible, [facility], [facility], APRN  [facility] [address], [place], [place] [address] [phone]

## 2013-12-23 ENCOUNTER — Other Ambulatory Visit: Payer: Self-pay | Admitting: Internal Medicine

## 2013-12-28 ENCOUNTER — Other Ambulatory Visit: Payer: Self-pay | Admitting: Internal Medicine

## 2013-12-29 NOTE — Telephone Encounter (Signed)
rx called into pharmacy

## 2013-12-29 NOTE — Telephone Encounter (Signed)
Okay #60 x 0 

## 2014-01-03 LAB — CANCER ANTIGEN 27.29: CA 27.29: 19.6 U/mL (ref 0.0–38.6)

## 2014-01-04 ENCOUNTER — Ambulatory Visit: Payer: Self-pay | Admitting: Pain Medicine

## 2014-01-08 ENCOUNTER — Ambulatory Visit: Payer: Self-pay | Admitting: Oncology

## 2014-01-09 ENCOUNTER — Other Ambulatory Visit: Payer: Self-pay | Admitting: Family Medicine

## 2014-01-09 ENCOUNTER — Other Ambulatory Visit: Payer: Self-pay | Admitting: Internal Medicine

## 2014-01-11 ENCOUNTER — Other Ambulatory Visit: Payer: Self-pay | Admitting: Internal Medicine

## 2014-01-11 ENCOUNTER — Telehealth: Payer: Self-pay

## 2014-01-11 MED ORDER — SULFAMETHOXAZOLE-TMP DS 800-160 MG PO TABS: 1.0000 | ORAL_TABLET | Freq: Two times a day (BID) | ORAL | Status: DC

## 2014-01-11 NOTE — Telephone Encounter (Signed)
Pt request a shoulder bag type portable oxygen; pt said she cannot lift the portable oxygen tank she has now to go up or down steps; pt cannot catch breath upon exertion without oxygen; feels like her lungs run out of air.[facility] presently supplies oxygen. Pt is on oxygen every night and on and off during the day. Pt said sitting or laying without oxygen pt is not usually SOB but occasionally gets SOB upon no exertion. Pt also request med for UTI sent to [facility]. Pt has burning upon urination for ? Several days. Pt has been incontinent of urine on and off. No increase in back or abd pain. No fever. Pt request cb.

## 2014-01-11 NOTE — Telephone Encounter (Signed)
Okay to send septra DS #6 x 0 1 tid If burning persists, needs eval

## 2014-01-11 NOTE — Telephone Encounter (Signed)
Check with [facility] about the change in oxygen

## 2014-01-11 NOTE — Telephone Encounter (Signed)
rx sent to pharmacy by e-script  

## 2014-01-13 NOTE — Telephone Encounter (Signed)
Rx done. 

## 2014-01-13 NOTE — Telephone Encounter (Signed)
Dr. Silvio Pate can you please write an order for patient to receive a portable oxygen unit? Pt states she can't leave the house without her oxygen and she can't carry the big tank.

## 2014-01-13 NOTE — Telephone Encounter (Signed)
Spoke with [facility] and faxed order to [phone]

## 2014-01-24 ENCOUNTER — Ambulatory Visit: Payer: Self-pay | Admitting: Pain Medicine

## 2014-01-26 ENCOUNTER — Other Ambulatory Visit: Payer: Self-pay | Admitting: Internal Medicine

## 2014-01-30 ENCOUNTER — Ambulatory Visit: Payer: Self-pay | Admitting: Oncology

## 2014-01-31 LAB — PATHOLOGY REPORT

## 2014-02-07 ENCOUNTER — Other Ambulatory Visit: Payer: Self-pay | Admitting: Internal Medicine

## 2014-02-08 ENCOUNTER — Ambulatory Visit: Payer: Self-pay | Admitting: Oncology

## 2014-02-13 ENCOUNTER — Other Ambulatory Visit: Payer: Self-pay | Admitting: *Deleted

## 2014-02-13 MED ORDER — INSULIN GLARGINE 100 UNIT/ML SOLOSTAR PEN: PEN_INJECTOR | SUBCUTANEOUS | Status: DC

## 2014-02-20 ENCOUNTER — Other Ambulatory Visit: Payer: Self-pay | Admitting: Family Medicine

## 2014-02-20 ENCOUNTER — Telehealth: Payer: Self-pay | Admitting: Internal Medicine

## 2014-02-20 MED ORDER — ALBUTEROL SULFATE (2.5 MG/3ML) 0.083% IN NEBU: 2.5000 mg | INHALATION_SOLUTION | Freq: Four times a day (QID) | RESPIRATORY_TRACT | Status: AC | PRN

## 2014-02-20 NOTE — Telephone Encounter (Signed)
rx sent to pharmacy by e-script Spoke with patient and advised results   

## 2014-02-20 NOTE — Telephone Encounter (Signed)
Error

## 2014-02-20 NOTE — Telephone Encounter (Signed)
Pt is needing Albuteral for her nebulizer. Please advise. [facility] [address]

## 2014-02-23 ENCOUNTER — Telehealth: Payer: Self-pay | Admitting: Internal Medicine

## 2014-02-23 NOTE — Telephone Encounter (Signed)
Patient Information:  Caller Name: [name]  Phone: [phone]  Patient: Madeline [name]  Gender: Female  DOB: 1951/05/09  Age: 63 Years  PCP: Viviana Simpler [facility])  Office Follow Up:  Does the office need to follow up with this patient?: No  Instructions For The Office: N/A  RN Note:  Pt agrees to OV and call back information  Symptoms  Reason For Call & Symptoms: Pt states that she has developed a viral infection onset 4/12.  Coughing productive that is yellow, Afebrile, nasal congestion, mild diarrhea.  BS have been normal.  Pt has sick contact with the same sxs.  Pt states that she has increased her oxygen and number of breathing treatments.  Reviewed Health History In EMR: Yes  Reviewed Medications In EMR: Yes  Reviewed Allergies In EMR: Yes  Reviewed Surgeries / Procedures: Yes  Date of Onset of Symptoms: 02/18/2014  Guideline(s) Used:  Cough  Disposition Per Guideline:   See Today in Office  Reason For Disposition Reached:   Known COPD or other severe lung disease (i.e., bronchiectasis, cystic fibrosis, lung surgery) and worsening symptoms (i.e., increased sputum purulence or amount, increased breathing difficulty)  Advice Given:  Call Back If:  You become worse.  Patient Will Follow Care Advice:  YES  Appointment Scheduled:  02/24/2014 10:15:00 Appointment Scheduled Provider:  Webb Silversmith  Pt advised that she does not have any transportation for today 4/16 and states that she can be seen on 4/17.  Appt scheduled however, attempted to explain to pt that dispo was to be seen today and that should her sxs become worse she needs to go to ED for eval or call back to office number.  Pt states that she understands.

## 2014-02-24 ENCOUNTER — Ambulatory Visit (INDEPENDENT_AMBULATORY_CARE_PROVIDER_SITE_OTHER): Payer: Medicare Other | Admitting: Internal Medicine

## 2014-02-24 ENCOUNTER — Encounter: Payer: Self-pay | Admitting: Internal Medicine

## 2014-02-24 VITALS — BP 136/68 | HR 74 | Temp 98.6°F | Wt 272.0 lb

## 2014-02-24 DIAGNOSIS — R05 Cough: Secondary | ICD-10-CM

## 2014-02-24 DIAGNOSIS — B379 Candidiasis, unspecified: Secondary | ICD-10-CM

## 2014-02-24 DIAGNOSIS — T3695XA Adverse effect of unspecified systemic antibiotic, initial encounter: Secondary | ICD-10-CM

## 2014-02-24 DIAGNOSIS — J441 Chronic obstructive pulmonary disease with (acute) exacerbation: Secondary | ICD-10-CM

## 2014-02-24 DIAGNOSIS — R059 Cough, unspecified: Secondary | ICD-10-CM

## 2014-02-24 MED ORDER — PREDNISONE 10 MG PO TABS: ORAL_TABLET | ORAL | Status: DC

## 2014-02-24 MED ORDER — FLUCONAZOLE 150 MG PO TABS: 150.0000 mg | ORAL_TABLET | Freq: Once | ORAL | Status: DC

## 2014-02-24 MED ORDER — AZITHROMYCIN 250 MG PO TABS: ORAL_TABLET | ORAL | Status: DC

## 2014-02-24 NOTE — Progress Notes (Signed)
HPI  Pt presents to the clinic today with c/o cough and chest congestion. She reports this started 1 week ago. The cough is productive of thick yellow mucous. She does feel short of breath at time and chest pain with cough. She denies fever, chills or body aches. She has take Benadryl OTC without any relief. She does have a history of allergies and COPD. She is on 2L [place] at home. She is a current smoker.  Review of Systems      Past Medical History  Diagnosis Date  . Depression   . Goiter, nodular     multi  . Dyslipidemia   . NIDDM, uncontrolled, with neuropathy   . Hypertension   . Allergy   . GERD (gastroesophageal reflux disease)   . Urinary incontinence   . Peripheral neuropathy   . Multiple sclerosis   . Breast cancer   . Hypothyroidism   . Asthma   . COPD (chronic obstructive pulmonary disease)   . DVT (deep venous thrombosis) 1990's    right leg  . Gout     Family History  Problem Relation Age of Onset  . Kidney failure Mother   . Heart disease Father 49  . Heart disease Brother   . Heart disease Brother     History   Social History  . Marital Status: Widowed    Spouse Name: N/A    Number of Children: 4  . 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 Smoking status: Former Smoker -- 0.50 packs/day    Types: Cigarettes    Quit date: 11/06/2013  . Smokeless tobacco: Never Used  . Alcohol Use: No  . Drug Use: No  . Sexual Activity: Not on file   Other Topics Concern  . Not on file   Social History Narrative   Widowed 1999 then 2nd Marriage--2000. Widowed again 2009   Living with daughter now   Has living will   Daughter [name] is health care POA.   Would accept rescitation but no prolonged artificial ventilation   No feeding tube if cognitively unaware    Allergies  Allergen Reactions  . Penicillins Swelling    tongue and throat swelling tolerates  cephalosporins  . Rosiglitazone Maleate Swelling     Constitutional: Denies headache, fatigue, fever or abrupt weight changes.  HEENT: Denies eye redness, eye pain, pressure behind the eyes, facial pain, nasal congestion, ear pain, ringing in the ears, wax buildup, runny nose or bloody nose. Respiratory: Positive cough, shortness of breath. Denies difficulty breathing.  Cardiovascular: Denies chest pain, chest tightness, palpitations or swelling in the hands or feet.   No other specific complaints in a complete review of systems (except as listed in HPI above).  Objective:   BP 136/68  Pulse 74  Temp(Src) 98.6 F (37 C)  Wt 272 lb (123.378 kg) Wt Readings from Last 3 Encounters:  02/24/14 272 lb (123.378 kg)  12/16/13 281 lb (127.461 kg)  11/17/13 272 lb (123.378 kg)     General: Appears her stated age, ill appearing in NAD. HEENT: Head: normal shape and size; Eyes: sclera white, no icterus, conjunctiva pink, PERRLA and EOMs intact; Ears: Tm's gray and intact, normal light reflex; Nose: mucosa pink and moist, septum midline; Throat/Mouth: + PND. Teeth present, mucosa erythematous and moist, no exudate noted, no lesions or ulcerations noted.  Neck: Neck supple, trachea  midline. No massses, lumps or thyromegaly present.  Cardiovascular: Normal rate and rhythm. Muffled, S1,S2 noted.  No murmur, rubs or gallops noted. No JVD or BLE edema. No carotid bruits noted. Pulmonary/Chest: Normal effort and scattered rhonchi throughout. No respiratory distress. No wheezes, ralesnoted.      Assessment & Plan:   COPD exacerbation:  Get some rest and drink plenty of water eRx for Azithromax x 5 days eRx for pred taper Continue O2 at 2L [place] OTC for cough eRx for diflucan for antibiotic induced yeast infection  RTC as needed or if symptoms persist.

## 2014-02-24 NOTE — Patient Instructions (Addendum)

## 2014-02-24 NOTE — Progress Notes (Signed)
Pre visit review using our clinic review tool, if applicable. No additional management support is needed unless otherwise documented below in the visit note. 

## 2014-02-25 ENCOUNTER — Other Ambulatory Visit: Payer: Self-pay | Admitting: Internal Medicine

## 2014-03-07 ENCOUNTER — Ambulatory Visit: Payer: Self-pay | Admitting: Pain Medicine

## 2014-03-08 ENCOUNTER — Other Ambulatory Visit: Payer: Self-pay | Admitting: Internal Medicine

## 2014-03-10 ENCOUNTER — Ambulatory Visit: Payer: Self-pay | Admitting: Oncology

## 2014-03-21 ENCOUNTER — Other Ambulatory Visit: Payer: Self-pay | Admitting: *Deleted

## 2014-03-21 MED ORDER — LEVOTHYROXINE SODIUM 175 MCG PO TABS: 175.0000 ug | ORAL_TABLET | Freq: Every day | ORAL | Status: DC

## 2014-03-28 ENCOUNTER — Other Ambulatory Visit: Payer: Self-pay | Admitting: *Deleted

## 2014-03-28 MED ORDER — ALLOPURINOL 100 MG PO TABS: 100.0000 mg | ORAL_TABLET | Freq: Every day | ORAL | Status: DC

## 2014-03-28 MED ORDER — POTASSIUM CHLORIDE ER 10 MEQ PO TBCR: 20.0000 meq | EXTENDED_RELEASE_TABLET | Freq: Every day | ORAL | Status: DC

## 2014-04-05 ENCOUNTER — Ambulatory Visit: Payer: Self-pay | Admitting: Pain Medicine

## 2014-04-10 ENCOUNTER — Ambulatory Visit: Payer: Self-pay | Admitting: Oncology

## 2014-05-04 ENCOUNTER — Ambulatory Visit: Payer: Self-pay | Admitting: Pain Medicine

## 2014-05-15 ENCOUNTER — Ambulatory Visit: Payer: Self-pay | Admitting: Pain Medicine

## 2014-05-16 ENCOUNTER — Ambulatory Visit: Payer: Self-pay | Admitting: Oncology

## 2014-05-16 ENCOUNTER — Other Ambulatory Visit: Payer: Self-pay | Admitting: Internal Medicine

## 2014-05-17 ENCOUNTER — Encounter: Payer: Self-pay | Admitting: Internal Medicine

## 2014-05-17 ENCOUNTER — Ambulatory Visit (INDEPENDENT_AMBULATORY_CARE_PROVIDER_SITE_OTHER): Payer: Medicare Other | Admitting: Internal Medicine

## 2014-05-17 VITALS — BP 140/70 | HR 77 | Temp 98.0°F | Wt 274.0 lb

## 2014-05-17 DIAGNOSIS — G35 Multiple sclerosis: Secondary | ICD-10-CM

## 2014-05-17 DIAGNOSIS — E1142 Type 2 diabetes mellitus with diabetic polyneuropathy: Secondary | ICD-10-CM

## 2014-05-17 DIAGNOSIS — J449 Chronic obstructive pulmonary disease, unspecified: Secondary | ICD-10-CM

## 2014-05-17 DIAGNOSIS — J4489 Other specified chronic obstructive pulmonary disease: Secondary | ICD-10-CM

## 2014-05-17 DIAGNOSIS — I10 Essential (primary) hypertension: Secondary | ICD-10-CM

## 2014-05-17 DIAGNOSIS — G35D Multiple sclerosis, unspecified: Secondary | ICD-10-CM

## 2014-05-17 DIAGNOSIS — E1149 Type 2 diabetes mellitus with other diabetic neurological complication: Secondary | ICD-10-CM

## 2014-05-17 DIAGNOSIS — F39 Unspecified mood [affective] disorder: Secondary | ICD-10-CM

## 2014-05-17 LAB — HEMOGLOBIN A1C: Hgb A1c MFr Bld: 8.3 % — ABNORMAL HIGH (ref 4.6–6.5)

## 2014-05-17 NOTE — Assessment & Plan Note (Signed)
Mild disability but doing okay in general

## 2014-05-17 NOTE — Assessment & Plan Note (Signed)
Depression in past Lots of life stress Okay on the med

## 2014-05-17 NOTE — Assessment & Plan Note (Signed)
Hopefully still good control If not, will increase lantus

## 2014-05-17 NOTE — Assessment & Plan Note (Signed)
No major pain No med changes needed

## 2014-05-17 NOTE — Progress Notes (Signed)
Pre visit review using our clinic review tool, if applicable. No additional management support is needed unless otherwise documented below in the visit note. 

## 2014-05-17 NOTE — Assessment & Plan Note (Signed)
BP Readings from Last 3 Encounters:  05/17/14 140/70  02/24/14 136/68  12/16/13 108/65   Good control

## 2014-05-17 NOTE — Assessment & Plan Note (Signed)
Stable Tough in summer Needs oxygen for airflight

## 2014-05-17 NOTE — Progress Notes (Signed)
Subjective:    Patient ID: Madeline [name], female    DOB: Mar 03, 1951, 63 y.o.   MRN: [id number]  HPI Doing well  Has trip planned to [facility] and then [place] [employer] Very excited Needs form for portable oxygen  Ongoing dyspnea in warm weather Not able to get out much Has to use portable tank when out Some cough daily--some sputum Still not able to stop smoking-- still 1 PPD. Has quit in the past but everyone else in house smokes and she can't maintain abstinence.  Diabetes okay Checks sugars tid---does adjust the novolog Running okay-- fasting usually 200 though  Some chest pain--feels it is the tightness from her COPD  MS stable May be unsteady in AM--uses walker (rollator)  Current Outpatient Prescriptions on File Prior to Visit  Medication Sig Dispense Refill  . albuterol (PROVENTIL) (2.5 MG/3ML) 0.083% nebulizer solution Take 3 mLs (2.5 mg total) by nebulization every 6 (six) hours as needed for wheezing or shortness of breath.  75 mL  11  . anastrozole (ARIMIDEX) 1 MG tablet       . aspirin 325 MG tablet Take 325 mg by mouth daily.        . budesonide-formoterol (SYMBICORT) 160-4.5 MCG/ACT inhaler Inhale 1 puff into the lungs 2 (two) times daily.  10.2 g  11  . Calcium Carbonate-Vitamin D (CALCIUM-VITAMIN D) 600-200 MG-UNIT CAPS Take by mouth daily.        . carvedilol (COREG) 25 MG tablet Take 1 tablet (25 mg total) by mouth 2 (two) times daily with a meal.  180 tablet  3  . diazepam (VALIUM) 5 MG tablet TAKE 1/2 TO 1 TABLET BY MOUTH TWICE A DAY AS NEEDED FOR NERVES  60 tablet  0  . fish oil-omega-3 fatty acids 1000 MG capsule Take 2 g by mouth daily.        Marland Kitchen FLUoxetine (PROZAC) 20 MG capsule Take 20 mg by mouth. TAKE 2 CAPSULES BY MOUTH EVERY DAY      . furosemide (LASIX) 80 MG tablet Take 1 tablet (80 mg total) by mouth daily.  180 tablet  0  . gabapentin (NEURONTIN) 300 MG capsule Take 8 tablets by mouth once daily      . Glatiramer Acetate (COPAXONE) 40  MG/ML SOSY Inject 40 mg into the skin 3 (three) times a week.      Marland Kitchen glucose blood (ONE TOUCH TEST STRIPS) test strip Use as instructed to test blood sugar 3 times daily dx: 250.60  300 each  3  . insulin aspart (NOVOLOG FLEXPEN) 100 UNIT/ML injection Sliding scale  10 pen  3  . Insulin Glargine (LANTUS SOLOSTAR) 100 UNIT/ML Solostar Pen INJECT 47 UNITS TWICE A DAY AS DIRECTED  45 mL  9  . Insulin Pen Needle 30G X 5 MM MISC Use as instructed to inject insulin 2-3 times daily dx 250.60  300 each  6  . KLOR-CON M10 10 MEQ tablet TAKE 2 TABLETS BY MOUTH EVERY DAY  60 tablet  0  . levothyroxine (SYNTHROID, LEVOTHROID) 175 MCG tablet Take 1 tablet (175 mcg total) by mouth daily.  90 tablet  0  . lidocaine (LIDODERM) 5 % Place 1-2 patches onto the skin every 12 (twelve) hours. then remove for 12 hours, repeat if tolerated  30 patch  0  . meloxicam (MOBIC) 15 MG tablet Take 1 tablet (15 mg total) by mouth daily as needed.  90 tablet  3  . metFORMIN (GLUCOPHAGE) 1000  MG tablet TAKE ONE TABLET BY MOUTH TWICE DAILY WITH FOOD  180 tablet  3  . omeprazole (PRILOSEC) 20 MG capsule TAKE ONE CAPSULE BY MOUTH TWICE DAILY  180 capsule  1  . ONE TOUCH LANCETS MISC Pt uses to test blood sugar 3 times daily dx 250.60  200 each  3  . oxyCODONE (OXY IR/ROXICODONE) 5 MG immediate release tablet Take 5 mg by mouth every 8 (eight) hours as needed.       . potassium chloride (K-DUR) 10 MEQ tablet Take 2 tablets (20 mEq total) by mouth daily.  180 tablet  0  . Respiratory Therapy Supplies (NEBULIZER/TUBING/MOUTHPIECE) KIT Use as directed dx: 496  1 each  0  . tiotropium (SPIRIVA) 18 MCG inhalation capsule Place 1 capsule (18 mcg total) into inhaler and inhale daily as needed.  90 capsule  1  . tiZANidine (ZANAFLEX) 2 MG tablet TAKE 1 TO 2 TABLETS 3 TIMES A DAY AS NEEDED  180 tablet  1  . traZODone (DESYREL) 100 MG tablet Take 1/2 tab in the morning and 2 tabs at bedtime  225 tablet  3   No current facility-administered  medications on file prior to visit.    Allergies  Allergen Reactions  . Penicillins Swelling    tongue and throat swelling tolerates cephalosporins  . Rosiglitazone Maleate Swelling    Past Medical History  Diagnosis Date  . Depression   . Goiter, nodular     multi  . Dyslipidemia   . NIDDM, uncontrolled, with neuropathy   . Hypertension   . Allergy   . GERD (gastroesophageal reflux disease)   . Urinary incontinence   . Peripheral neuropathy   . Multiple sclerosis   . Breast cancer   . Hypothyroidism   . Asthma   . COPD (chronic obstructive pulmonary disease)   . DVT (deep venous thrombosis) 1990's    right leg  . Gout     Past Surgical History  Procedure Laterality Date  . Thyroidectomy  09/2004  . Transthoracic echocardiogram  05/16/2004  . Partial hysterectomy  1975  . Axillary hidradenitis excision  1993    Excision biopsy growth right axilla, benign   . Other surgical history      Thyroid biopsy 10/99  . Carpal tunnel release  03/2008    bilateral  . Mastectomy, radical  02/2009    left modified  . Tonsillectomy    . Appendectomy      Family History  Problem Relation Age of Onset  . Kidney failure Mother   . Heart disease Father 11  . Heart disease Brother   . Heart disease Brother     History   Social History  . Marital Status: Widowed    Spouse Name: N/A    Number of Children: 4  . 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 Smoking status: Former Smoker -- 0.50 packs/day    Types: Cigarettes    Quit date: 11/06/2013  . Smokeless tobacco: Never Used  . Alcohol Use: No  . Drug Use: No  . Sexual Activity: Not on file   Other Topics Concern  . Not on file   Social History Narrative   Widowed 1999 then 2nd Marriage--2000. Widowed again 2009   Living with daughter now   Has living will   Daughter [name] is health care POA.   Would  accept  rescitation but no prolonged artificial ventilation   No feeding tube if cognitively unaware   Review of Systems Sleeps well Appetite is good--trying to cut down Weight is up 2#    Objective:   Physical Exam  Constitutional: She appears well-developed and well-nourished. No distress.  Neck: Normal range of motion. Neck supple.  Cardiovascular: Normal rate, regular rhythm and normal heart sounds.  Exam reveals no gallop.   No murmur heard. ppulses not palpable but feet well perfused  Pulmonary/Chest: Effort normal. No respiratory distress. She has no wheezes. She has no rales.  Decreased breath sounds but clear  Musculoskeletal:  1+ edema in ankles  Lymphadenopathy:    She has no cervical adenopathy.  Skin:  No foot lesions  Psychiatric: She has a normal mood and affect. Her behavior is normal.          Assessment & Plan:

## 2014-05-18 ENCOUNTER — Telehealth: Payer: Self-pay | Admitting: *Deleted

## 2014-05-18 NOTE — Telephone Encounter (Signed)
Order faxed Spoke with patient and advised results

## 2014-05-18 NOTE — Telephone Encounter (Signed)
I wrote an order for her while she was here but I will do it again

## 2014-05-18 NOTE — Telephone Encounter (Signed)
Pt calling this morning asking for an order for a portable oxygen concentrator, pt states [facility] will cover this and they have in stock, fax order to [phone], please advise

## 2014-05-18 NOTE — Telephone Encounter (Signed)
As noted, please fax Rx

## 2014-06-05 ENCOUNTER — Other Ambulatory Visit: Payer: Self-pay | Admitting: *Deleted

## 2014-06-05 ENCOUNTER — Ambulatory Visit: Payer: Self-pay | Admitting: Pain Medicine

## 2014-06-05 NOTE — Telephone Encounter (Signed)
04/04/14 

## 2014-06-06 MED ORDER — DIAZEPAM 5 MG PO TABS: 2.5000 mg | ORAL_TABLET | Freq: Two times a day (BID) | ORAL | Status: DC | PRN

## 2014-06-06 NOTE — Telephone Encounter (Signed)
Okay #60 x 0 

## 2014-06-06 NOTE — Telephone Encounter (Signed)
rx called into pharmacy

## 2014-06-10 ENCOUNTER — Ambulatory Visit: Payer: Self-pay | Admitting: Oncology

## 2014-06-16 ENCOUNTER — Other Ambulatory Visit: Payer: Self-pay | Admitting: *Deleted

## 2014-06-16 MED ORDER — POTASSIUM CHLORIDE CRYS ER 10 MEQ PO TBCR: EXTENDED_RELEASE_TABLET | ORAL | Status: DC

## 2014-06-16 MED ORDER — LEVOTHYROXINE SODIUM 175 MCG PO TABS: 175.0000 ug | ORAL_TABLET | Freq: Every day | ORAL | Status: DC

## 2014-06-16 MED ORDER — CARVEDILOL 25 MG PO TABS
25.0000 mg | ORAL_TABLET | Freq: Two times a day (BID) | ORAL | Status: DC
Start: 2014-06-16 — End: 2014-12-15

## 2014-06-16 NOTE — Telephone Encounter (Signed)
Needed clarification on Allopurinol dosing. Pharmacy sent request for 100mg  1 tab daily, but on her med list it says 100mg  2 tabs daily. Left message on pt's voicemail for her to return call to office.

## 2014-06-19 MED ORDER — ALLOPURINOL 100 MG PO TABS: 200.0000 mg | ORAL_TABLET | Freq: Every day | ORAL | Status: DC

## 2014-06-19 NOTE — Telephone Encounter (Signed)
Pt returned call and verified she is taking 100 mg 2 tabs daily. She says med had been changed by Dr Jefm Bryant.

## 2014-06-27 ENCOUNTER — Encounter: Payer: Self-pay | Admitting: General Surgery

## 2014-06-29 ENCOUNTER — Inpatient Hospital Stay: Payer: Self-pay | Admitting: Internal Medicine

## 2014-06-29 LAB — COMPREHENSIVE METABOLIC PANEL
ALT: 11 U/L — AB
Albumin: 2.7 g/dL — ABNORMAL LOW (ref 3.4–5.0)
Alkaline Phosphatase: 76 U/L
Anion Gap: 6 — ABNORMAL LOW (ref 7–16)
BUN: 15 mg/dL (ref 7–18)
Bilirubin,Total: 0.4 mg/dL (ref 0.2–1.0)
CALCIUM: 9.1 mg/dL (ref 8.5–10.1)
CHLORIDE: 99 mmol/L (ref 98–107)
CO2: 30 mmol/L (ref 21–32)
CREATININE: 0.84 mg/dL (ref 0.60–1.30)
EGFR (African American): >60
EGFR (Non-African Amer.): >60
GLUCOSE: 185 mg/dL — AB (ref 65–99)
Osmolality: 276 (ref 275–301)
Potassium: 4.7 mmol/L (ref 3.5–5.1)
SGOT(AST): 13 U/L — ABNORMAL LOW (ref 15–37)
Sodium: 135 mmol/L — ABNORMAL LOW (ref 136–145)
Total Protein: 6.9 g/dL (ref 6.4–8.2)

## 2014-06-29 LAB — CBC
HCT: 31.9 % — AB (ref 35.0–47.0)
HGB: 10.1 g/dL — ABNORMAL LOW (ref 12.0–16.0)
MCH: 25.4 pg — AB (ref 26.0–34.0)
MCHC: 31.6 g/dL — ABNORMAL LOW (ref 32.0–36.0)
MCV: 81 fL (ref 80–100)
Platelet: 200 10*3/uL (ref 150–440)
RBC: 3.96 10*6/uL (ref 3.80–5.20)
RDW: 17.1 % — ABNORMAL HIGH (ref 11.5–14.5)
WBC: 13.1 10*3/uL — AB (ref 3.6–11.0)

## 2014-06-29 LAB — PRO B NATRIURETIC PEPTIDE: B-Type Natriuretic Peptide: 2155 pg/mL — ABNORMAL HIGH (ref 0–125)

## 2014-06-29 LAB — TROPONIN I: Troponin-I: 0.02 ng/mL

## 2014-06-30 LAB — CBC WITH DIFFERENTIAL/PLATELET
Basophil #: 0.1 10*3/uL (ref 0.0–0.1)
Basophil %: 0.7 %
EOS ABS: 0.3 10*3/uL (ref 0.0–0.7)
EOS PCT: 2.6 %
HCT: 30 % — ABNORMAL LOW (ref 35.0–47.0)
HGB: 9.6 g/dL — ABNORMAL LOW (ref 12.0–16.0)
LYMPHS ABS: 1.1 10*3/uL (ref 1.0–3.6)
LYMPHS PCT: 10.5 %
MCH: 25.4 pg — AB (ref 26.0–34.0)
MCHC: 31.9 g/dL — ABNORMAL LOW (ref 32.0–36.0)
MCV: 80 fL (ref 80–100)
Monocyte #: 1.4 x10 3/mm — ABNORMAL HIGH (ref 0.2–0.9)
Monocyte %: 13.7 %
NEUTROS PCT: 72.5 %
Neutrophil #: 7.3 10*3/uL — ABNORMAL HIGH (ref 1.4–6.5)
Platelet: 191 10*3/uL (ref 150–440)
RBC: 3.76 10*6/uL — ABNORMAL LOW (ref 3.80–5.20)
RDW: 16.8 % — ABNORMAL HIGH (ref 11.5–14.5)
WBC: 10.1 10*3/uL (ref 3.6–11.0)

## 2014-06-30 LAB — CREATININE, SERUM
Creatinine: 0.91 mg/dL (ref 0.60–1.30)
EGFR (African American): >60

## 2014-07-03 ENCOUNTER — Other Ambulatory Visit: Payer: Self-pay | Admitting: *Deleted

## 2014-07-03 MED ORDER — OMEPRAZOLE 20 MG PO CPDR: DELAYED_RELEASE_CAPSULE | ORAL | Status: DC

## 2014-07-04 LAB — WOUND CULTURE

## 2014-07-04 LAB — CULTURE, BLOOD (SINGLE)

## 2014-07-06 ENCOUNTER — Ambulatory Visit: Payer: Self-pay | Admitting: Pain Medicine

## 2014-07-11 ENCOUNTER — Ambulatory Visit: Payer: Self-pay | Admitting: Oncology

## 2014-07-12 ENCOUNTER — Encounter: Payer: Self-pay | Admitting: Internal Medicine

## 2014-07-12 ENCOUNTER — Ambulatory Visit (INDEPENDENT_AMBULATORY_CARE_PROVIDER_SITE_OTHER): Payer: Medicare Other | Admitting: Internal Medicine

## 2014-07-12 VITALS — BP 120/80 | HR 81 | Temp 98.1°F | Wt 258.0 lb

## 2014-07-12 DIAGNOSIS — L97509 Non-pressure chronic ulcer of other part of unspecified foot with unspecified severity: Secondary | ICD-10-CM

## 2014-07-12 DIAGNOSIS — L97519 Non-pressure chronic ulcer of other part of right foot with unspecified severity: Principal | ICD-10-CM

## 2014-07-12 DIAGNOSIS — E11621 Type 2 diabetes mellitus with foot ulcer: Secondary | ICD-10-CM

## 2014-07-12 DIAGNOSIS — F39 Unspecified mood [affective] disorder: Secondary | ICD-10-CM

## 2014-07-12 DIAGNOSIS — M109 Gout, unspecified: Secondary | ICD-10-CM

## 2014-07-12 DIAGNOSIS — E1169 Type 2 diabetes mellitus with other specified complication: Secondary | ICD-10-CM

## 2014-07-12 DIAGNOSIS — M10071 Idiopathic gout, right ankle and foot: Secondary | ICD-10-CM

## 2014-07-12 LAB — URIC ACID: Uric Acid, Serum: 7.2 mg/dL — ABNORMAL HIGH (ref 2.4–7.0)

## 2014-07-12 MED ORDER — COLCHICINE 0.6 MG PO TABS: 0.6000 mg | ORAL_TABLET | Freq: Two times a day (BID) | ORAL | Status: DC | PRN

## 2014-07-12 MED ORDER — DIAZEPAM 5 MG PO TABS: 2.5000 mg | ORAL_TABLET | Freq: Two times a day (BID) | ORAL | Status: DC | PRN

## 2014-07-12 NOTE — Assessment & Plan Note (Signed)
Ongoing anxiety Will refill the diazepam

## 2014-07-12 NOTE — Patient Instructions (Signed)
Please take the colchicine twice a day till your pain is gone. If you are doing okay, you can cut down to once a day. If your uric acid level is low now, you will need to stay on it once a day. If your level is high and I increase the allopurinol, you may be able to go off the colchicine.

## 2014-07-12 NOTE — Progress Notes (Signed)
Pre visit review using our clinic review tool, if applicable. No additional management support is needed unless otherwise documented below in the visit note. 

## 2014-07-12 NOTE — Assessment & Plan Note (Addendum)
Healing up Hospital records reviewed Infection seems cleared---MRSA and GNR. Finished dual therapy Discussed checking feet daily and protecting them

## 2014-07-12 NOTE — Progress Notes (Signed)
Subjective:    Patient ID: Madeline [name], female    DOB: 06/10/1951, 63 y.o.   MRN: [id number]  HPI Here with daughter Madeline [name] with her  Was hospitalized after getting sick-- "out of my head" and fever MRSA and GNR--- Rx with doxy and bactrim which finished yesterday  Seems to be getting worse again Not at toe but at proximal metatarsals mostly Putative gout diagnosis--- but no joint aspiration Still on allopurinol Had brief course of colchicine which seemed to help  Current Outpatient Prescriptions on File Prior to Visit  Medication Sig Dispense Refill  . albuterol (PROVENTIL) (2.5 MG/3ML) 0.083% nebulizer solution Take 3 mLs (2.5 mg total) by nebulization every 6 (six) hours as needed for wheezing or shortness of breath.  75 mL  11  . allopurinol (ZYLOPRIM) 100 MG tablet Take 2 tablets (200 mg total) by mouth daily.  180 tablet  1  . anastrozole (ARIMIDEX) 1 MG tablet       . aspirin 325 MG tablet Take 325 mg by mouth daily.        . budesonide-formoterol (SYMBICORT) 160-4.5 MCG/ACT inhaler Inhale 1 puff into the lungs 2 (two) times daily.  10.2 g  11  . Calcium Carbonate-Vitamin D (CALCIUM-VITAMIN D) 600-200 MG-UNIT CAPS Take by mouth daily.        . carvedilol (COREG) 25 MG tablet Take 1 tablet (25 mg total) by mouth 2 (two) times daily with a meal.  180 tablet  1  . diazepam (VALIUM) 5 MG tablet Take 0.5-1 tablets (2.5-5 mg total) by mouth 2 (two) times daily as needed for anxiety.  60 tablet  0  . fish oil-omega-3 fatty acids 1000 MG capsule Take 2 g by mouth daily.        Marland Kitchen FLUoxetine (PROZAC) 20 MG capsule Take 20 mg by mouth. TAKE 2 CAPSULES BY MOUTH EVERY DAY      . furosemide (LASIX) 80 MG tablet Take 1 tablet (80 mg total) by mouth daily.  180 tablet  0  . gabapentin (NEURONTIN) 300 MG capsule Take 8 tablets by mouth once daily      . Glatiramer Acetate (COPAXONE) 40 MG/ML SOSY Inject 40 mg into the skin 3 (three) times a week.      Marland Kitchen glucose blood (ONE TOUCH  TEST STRIPS) test strip Use as instructed to test blood sugar 3 times daily dx: 250.60  300 each  3  . insulin aspart (NOVOLOG FLEXPEN) 100 UNIT/ML injection Sliding scale  10 pen  3  . Insulin Glargine (LANTUS SOLOSTAR) 100 UNIT/ML Solostar Pen INJECT 47 UNITS TWICE A DAY AS DIRECTED  45 mL  9  . Insulin Pen Needle 30G X 5 MM MISC Use as instructed to inject insulin 2-3 times daily dx 250.60  300 each  6  . levothyroxine (SYNTHROID, LEVOTHROID) 175 MCG tablet Take 1 tablet (175 mcg total) by mouth daily.  90 tablet  1  . lidocaine (LIDODERM) 5 % Place 1-2 patches onto the skin every 12 (twelve) hours. then remove for 12 hours, repeat if tolerated  30 patch  0  . meloxicam (MOBIC) 15 MG tablet Take 1 tablet (15 mg total) by mouth daily as needed.  90 tablet  3  . metFORMIN (GLUCOPHAGE) 1000 MG tablet TAKE ONE TABLET BY MOUTH TWICE DAILY WITH FOOD  180 tablet  3  . omeprazole (PRILOSEC) 20 MG capsule TAKE ONE CAPSULE BY MOUTH TWICE DAILY  180 capsule  1  . ONE TOUCH  LANCETS MISC Pt uses to test blood sugar 3 times daily dx 250.60  200 each  3  . oxyCODONE (OXY IR/ROXICODONE) 5 MG immediate release tablet Take 5 mg by mouth every 8 (eight) hours as needed.       . potassium chloride (K-DUR) 10 MEQ tablet Take 2 tablets (20 mEq total) by mouth daily.  180 tablet  0  . potassium chloride (KLOR-CON M10) 10 MEQ tablet TAKE 2 TABLETS BY MOUTH EVERY DAY  180 tablet  1  . Respiratory Therapy Supplies (NEBULIZER/TUBING/MOUTHPIECE) KIT Use as directed dx: 496  1 each  0  . SPIRIVA HANDIHALER 18 MCG inhalation capsule PLACE 1 CAPSULE (18 MCG TOTAL) INTO INHALER AND INHALE DAILY AS NEEDED.  90 capsule  1  . tiZANidine (ZANAFLEX) 2 MG tablet TAKE 1 TO 2 TABLETS 3 TIMES A DAY AS NEEDED  180 tablet  1  . traZODone (DESYREL) 100 MG tablet Take 1/2 tab in the morning and 2 tabs at bedtime  225 tablet  3   No current facility-administered medications on file prior to visit.    Allergies  Allergen Reactions  .  Penicillins Swelling    tongue and throat swelling tolerates cephalosporins  . Rosiglitazone Maleate Swelling    Past Medical History  Diagnosis Date  . Depression   . Goiter, nodular     multi  . Dyslipidemia   . NIDDM, uncontrolled, with neuropathy   . Hypertension   . Allergy   . GERD (gastroesophageal reflux disease)   . Urinary incontinence   . Peripheral neuropathy   . Multiple sclerosis   . Breast cancer   . Hypothyroidism   . Asthma   . COPD (chronic obstructive pulmonary disease)   . DVT (deep venous thrombosis) 1990's    right leg  . Gout     Past Surgical History  Procedure Laterality Date  . Thyroidectomy  09/2004  . Transthoracic echocardiogram  05/16/2004  . Partial hysterectomy  1975  . Axillary hidradenitis excision  1993    Excision biopsy growth right axilla, benign   . Other surgical history      Thyroid biopsy 10/99  . Carpal tunnel release  03/2008    bilateral  . Mastectomy, radical  02/2009    left modified  . Tonsillectomy    . Appendectomy      Family History  Problem Relation Age of Onset  . Kidney failure Mother   . Heart disease Father 71  . Heart disease Brother   . Heart disease Brother     History   Social History  . Marital Status: Widowed    Spouse Name: N/A    Number of Children: 4  . 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 Smoking status: Former Smoker -- 0.50 packs/day    Types: Cigarettes    Quit date: 11/06/2013  . Smokeless tobacco: Never Used  . Alcohol Use: No  . Drug Use: No  . Sexual Activity: Not on file   Other Topics Concern  . Not on file   Social History Narrative   Widowed 1999 then 2nd Marriage--2000. Widowed again 2009   Living with daughter now   Has living will   Daughter Madeline [name] is health care POA.   Would accept rescitation but no prolonged artificial ventilation   No feeding tube if cognitively  unaware  Review of Systems No fever now Not systemically ill but has been getting weaker Appetite is improving--but not back to normal    Objective:   Physical Exam  Musculoskeletal:  Redness with exquisite tenderness at proximal side of 3rd-5th metatarsals and moderate tenderness with minimal redness at 1st MTP  Skin:  Ulcer under right great toe--firm adherent Slight pale redness but no fluctuance, warmth or tenderness. Some dead skin peeled off          Assessment & Plan:

## 2014-07-12 NOTE — Assessment & Plan Note (Signed)
2 different things going on The cellulitis has cleared Now the pain/inflamed spots are clearly gouty Will check uric acid and increased allopurinol if over 6 Restart colchicine--may need daily also

## 2014-07-13 ENCOUNTER — Other Ambulatory Visit: Payer: Self-pay | Admitting: *Deleted

## 2014-07-13 MED ORDER — ALLOPURINOL 300 MG PO TABS: 300.0000 mg | ORAL_TABLET | Freq: Every day | ORAL | Status: DC

## 2014-07-13 MED ORDER — FLUCONAZOLE 150 MG PO TABS: 150.0000 mg | ORAL_TABLET | Freq: Every day | ORAL | Status: DC

## 2014-07-13 NOTE — Telephone Encounter (Signed)
rx sent to pharmacy by e-script Spoke with patient and advised results   

## 2014-07-13 NOTE — Telephone Encounter (Signed)
Message copied by Despina Hidden on Thu Jul 13, 2014  1:34 PM ------      Message from: Viviana Simpler I      Created: Thu Jul 13, 2014  1:02 PM       Please call      She needs to increase the allopurinol to 300mg  daily (1 year Rx)      I will recheck the uric acid at her next visit      If the gout completely quiets down, she can try weaning off the colchicine completely in 2-3 weeks---but it is okay if she needs to stay on one daily indefinitely ------

## 2014-07-13 NOTE — Telephone Encounter (Signed)
Spoke with patient about her labs and pt is asking if she can have something for yeast after taking all those antibiotics she feels "raw" please advise

## 2014-07-13 NOTE — Telephone Encounter (Signed)
Okay fluconazole 150mg  #2 x 1 One now and repeat in 1 week prn

## 2014-07-20 ENCOUNTER — Telehealth: Payer: Self-pay | Admitting: Internal Medicine

## 2014-07-20 MED ORDER — PREDNISONE 20 MG PO TABS: 40.0000 mg | ORAL_TABLET | Freq: Every day | ORAL | Status: DC

## 2014-07-20 NOTE — Telephone Encounter (Signed)
Spoke with Gerald Stabs from [place] and advised the plan

## 2014-07-20 NOTE — Telephone Encounter (Signed)
Fax from [employer] from [place] Ankle swelling and redness are worse. Reviewed with patient Toe is fine---still sounds like gout despite increase in allopurinol and the colchicine.  Will try course of prednisone If not better by Monday, will need to reevaluate  Please call Gerald Stabs at [facility] and inform about the plan

## 2014-07-21 ENCOUNTER — Other Ambulatory Visit: Payer: Self-pay | Admitting: *Deleted

## 2014-07-22 NOTE — Telephone Encounter (Signed)
Okay to refill #180 x 1 if she is due

## 2014-07-24 MED ORDER — TIZANIDINE HCL 2 MG PO TABS: ORAL_TABLET | ORAL | Status: DC

## 2014-07-24 NOTE — Telephone Encounter (Signed)
rx sent to pharmacy by e-script  

## 2014-07-26 ENCOUNTER — Telehealth: Payer: Self-pay | Admitting: Internal Medicine

## 2014-07-26 ENCOUNTER — Inpatient Hospital Stay: Payer: Self-pay | Admitting: Internal Medicine

## 2014-07-26 LAB — COMPREHENSIVE METABOLIC PANEL
ALK PHOS: 102 U/L
ALT: 12 U/L — AB
Albumin: 2.8 g/dL — ABNORMAL LOW (ref 3.4–5.0)
Anion Gap: 8 (ref 7–16)
BILIRUBIN TOTAL: 0.3 mg/dL (ref 0.2–1.0)
BUN: 20 mg/dL — ABNORMAL HIGH (ref 7–18)
CALCIUM: 9 mg/dL (ref 8.5–10.1)
CHLORIDE: 98 mmol/L (ref 98–107)
CREATININE: 0.96 mg/dL (ref 0.60–1.30)
Co2: 31 mmol/L (ref 21–32)
EGFR (African American): >60
GLUCOSE: 153 mg/dL — AB (ref 65–99)
Osmolality: 279 (ref 275–301)
Potassium: 4.2 mmol/L (ref 3.5–5.1)
SGOT(AST): 15 U/L (ref 15–37)
Sodium: 137 mmol/L (ref 136–145)
TOTAL PROTEIN: 7.6 g/dL (ref 6.4–8.2)

## 2014-07-26 LAB — CBC
HCT: 38.8 % (ref 35.0–47.0)
HGB: 12.1 g/dL (ref 12.0–16.0)
MCH: 24.9 pg — AB (ref 26.0–34.0)
MCHC: 31.1 g/dL — ABNORMAL LOW (ref 32.0–36.0)
MCV: 80 fL (ref 80–100)
Platelet: 344 10*3/uL (ref 150–440)
RBC: 4.85 10*6/uL (ref 3.80–5.20)
RDW: 18.5 % — ABNORMAL HIGH (ref 11.5–14.5)
WBC: 12 10*3/uL — ABNORMAL HIGH (ref 3.6–11.0)

## 2014-07-26 LAB — SEDIMENTATION RATE: Erythrocyte Sed Rate: 29 mm/hr (ref 0–30)

## 2014-07-26 LAB — URIC ACID: Uric Acid: 4.2 mg/dL (ref 2.6–6.0)

## 2014-07-26 NOTE — Telephone Encounter (Signed)
Please call to check on her tomorrow

## 2014-07-26 NOTE — Telephone Encounter (Signed)
Patient Information:  Caller Name: [name]  Phone: [phone]  Patient: Madeline [name], Madeline [name]  Gender: Female  DOB: 1951-05-22  Age: 62 Years  PCP: Viviana Simpler [facility])  Office Follow Up:  Does the office need to follow up with this patient?: No  Instructions For The Office: N/A  RN Note:  Per disposition contacted the office and spoke with Benjie Karvonen and referred pt to ED for eval.  Pt is unable to walk and advised calling EMS for transport.  Symptoms  Reason For Call & Symptoms: Pt reports she is in severe pain with gout right ankle and  prescribed  prednisone and colchicine.  Pt is crying from the pain rating 8 on pain scale, 0-10,  Reviewed Health History In EMR: Yes  Reviewed Medications In EMR: Yes  Reviewed Allergies In EMR: Yes  Reviewed Surgeries / Procedures: Yes  Date of Onset of Symptoms: 07/12/2014  Guideline(s) Used:  Foot Pain  Ankle Pain  Disposition Per Guideline:   Go to ED Now (or to Office with PCP Approval)  Reason For Disposition Reached:   Fever and swollen joint  Advice Given:  N/A  Patient Will Follow Care Advice:  YES

## 2014-07-26 NOTE — Telephone Encounter (Signed)
Sent to triage

## 2014-07-27 LAB — BASIC METABOLIC PANEL
ANION GAP: 5 — AB (ref 7–16)
BUN: 22 mg/dL — ABNORMAL HIGH (ref 7–18)
CALCIUM: 9.1 mg/dL (ref 8.5–10.1)
CHLORIDE: 96 mmol/L — AB (ref 98–107)
CO2: 34 mmol/L — AB (ref 21–32)
Creatinine: 0.83 mg/dL (ref 0.60–1.30)
EGFR (African American): >60
EGFR (Non-African Amer.): >60
GLUCOSE: 131 mg/dL — AB (ref 65–99)
Osmolality: 275 (ref 275–301)
Potassium: 3.9 mmol/L (ref 3.5–5.1)
Sodium: 135 mmol/L — ABNORMAL LOW (ref 136–145)

## 2014-07-27 LAB — CBC WITH DIFFERENTIAL/PLATELET
BASOS ABS: 0.1 10*3/uL (ref 0.0–0.1)
Basophil %: 0.8 %
Eosinophil #: 0.2 10*3/uL (ref 0.0–0.7)
Eosinophil %: 2.1 %
HCT: 34.5 % — ABNORMAL LOW (ref 35.0–47.0)
HGB: 11 g/dL — AB (ref 12.0–16.0)
LYMPHS ABS: 1.8 10*3/uL (ref 1.0–3.6)
LYMPHS PCT: 17.9 %
MCH: 25.3 pg — ABNORMAL LOW (ref 26.0–34.0)
MCHC: 31.7 g/dL — ABNORMAL LOW (ref 32.0–36.0)
MCV: 80 fL (ref 80–100)
Monocyte #: 1.2 x10 3/mm — ABNORMAL HIGH (ref 0.2–0.9)
Monocyte %: 12.3 %
NEUTROS ABS: 6.6 10*3/uL — AB (ref 1.4–6.5)
Neutrophil %: 66.9 %
Platelet: 326 10*3/uL (ref 150–440)
RBC: 4.33 10*6/uL (ref 3.80–5.20)
RDW: 18.5 % — AB (ref 11.5–14.5)
WBC: 9.9 10*3/uL (ref 3.6–11.0)

## 2014-07-27 NOTE — Telephone Encounter (Signed)
.  left message to have patient return my call.  

## 2014-07-27 NOTE — Telephone Encounter (Signed)
Spoke with her in 98. Initial concern for osteomyelitis On vanco and clinda but MRI today shows Charcot joint Still on the antibiotics for now Being referred to podiatrist I guess Will follow while she is there

## 2014-07-27 NOTE — Telephone Encounter (Signed)
Patient was admitted into [facility] for osteomyelitis per sister in-law

## 2014-07-29 LAB — CREATININE, SERUM: Creatinine: 0.87 mg/dL (ref 0.60–1.30)

## 2014-07-29 LAB — VANCOMYCIN, TROUGH: Vancomycin, Trough: 9 ug/mL — ABNORMAL LOW (ref 10–20)

## 2014-07-30 LAB — CBC WITH DIFFERENTIAL/PLATELET
Basophil #: 0.1 10*3/uL (ref 0.0–0.1)
Basophil %: 1.5 %
EOS ABS: 0.2 10*3/uL (ref 0.0–0.7)
EOS PCT: 3.2 %
HCT: 35.7 % (ref 35.0–47.0)
HGB: 10.9 g/dL — ABNORMAL LOW (ref 12.0–16.0)
LYMPHS ABS: 1.8 10*3/uL (ref 1.0–3.6)
Lymphocyte %: 24.1 %
MCH: 24.8 pg — AB (ref 26.0–34.0)
MCHC: 30.6 g/dL — AB (ref 32.0–36.0)
MCV: 81 fL (ref 80–100)
MONOS PCT: 12.3 %
Monocyte #: 0.9 x10 3/mm (ref 0.2–0.9)
NEUTROS PCT: 58.9 %
Neutrophil #: 4.3 10*3/uL (ref 1.4–6.5)
PLATELETS: 332 10*3/uL (ref 150–440)
RBC: 4.41 10*6/uL (ref 3.80–5.20)
RDW: 18.2 % — ABNORMAL HIGH (ref 11.5–14.5)
WBC: 7.3 10*3/uL (ref 3.6–11.0)

## 2014-07-31 DIAGNOSIS — I517 Cardiomegaly: Secondary | ICD-10-CM

## 2014-07-31 LAB — CULTURE, BLOOD (SINGLE)

## 2014-08-01 ENCOUNTER — Telehealth: Payer: Self-pay | Admitting: Internal Medicine

## 2014-08-01 LAB — CULTURE, BLOOD (SINGLE)

## 2014-08-01 NOTE — Telephone Encounter (Signed)
Was discharged Charcot joint ---- seeing Dr Elvina Mattes. Has boot and trying to stay off except to go to the bathroom On IV antibiotics because apparent osteo also--with positive blood culture.  Has appts coming up---will be hard to keep them  Madeline [name], Please try to get a discharge summary for me and reschedule her upcoming appt with me till early November or so

## 2014-08-04 LAB — CULTURE, BLOOD (SINGLE)

## 2014-08-08 ENCOUNTER — Ambulatory Visit: Payer: Self-pay | Admitting: Pain Medicine

## 2014-08-10 ENCOUNTER — Ambulatory Visit: Payer: Medicare Other | Admitting: Internal Medicine

## 2014-08-15 ENCOUNTER — Other Ambulatory Visit: Payer: Self-pay | Admitting: *Deleted

## 2014-08-15 MED ORDER — BUDESONIDE-FORMOTEROL FUMARATE 160-4.5 MCG/ACT IN AERO: 1.0000 | INHALATION_SPRAY | Freq: Two times a day (BID) | RESPIRATORY_TRACT | Status: DC

## 2014-08-15 MED ORDER — MELOXICAM 15 MG PO TABS: 15.0000 mg | ORAL_TABLET | Freq: Every day | ORAL | Status: DC | PRN

## 2014-08-15 MED ORDER — TIOTROPIUM BROMIDE MONOHYDRATE 18 MCG IN CAPS: ORAL_CAPSULE | RESPIRATORY_TRACT | Status: DC

## 2014-08-15 MED ORDER — FUROSEMIDE 80 MG PO TABS: 80.0000 mg | ORAL_TABLET | Freq: Every day | ORAL | Status: DC

## 2014-08-15 MED ORDER — METFORMIN HCL 1000 MG PO TABS: ORAL_TABLET | ORAL | Status: DC

## 2014-08-15 MED ORDER — FLUOXETINE HCL 20 MG PO CAPS: ORAL_CAPSULE | ORAL | Status: DC

## 2014-08-15 MED ORDER — TRAZODONE HCL 100 MG PO TABS: ORAL_TABLET | ORAL | Status: DC

## 2014-08-15 NOTE — Telephone Encounter (Signed)
Called patient, no answer. Left voicemail with callback number requesting patient to call the office.

## 2014-08-15 NOTE — Telephone Encounter (Signed)
Received fax refill request for pt's medications. The fluoxetine instructions on the request are different that what we have on file for pt, so I called and left message on machine for pt to return call to office. Once she calls back I need to verify fluoxetine dosing instructions. Is she taking 20 mg 1 tab daily or 20 mg 2 tabs daily?   Pharmacy also request trazadone, and mobic. Pt has a f/u appt with you on 09/12/14.

## 2014-08-15 NOTE — Telephone Encounter (Signed)
Spoke to patient and she stated that her cancer doctor prescibed the 1/2 tab in the a.m. A long time ago when she was being treated for cancer. Patient stated that she did not need to take it in the morning anymore and she was fine with changing it to 2tabs at bedtime. Rx sent into perferred pharmacy.

## 2014-08-15 NOTE — Telephone Encounter (Signed)
The mobic is fine  Please confirm about the trazodone---I never prescribe it for morning use (someone else must have) but I am okay with the 2 at bedtime x 1 year

## 2014-08-15 NOTE — Telephone Encounter (Signed)
Pt returned my call, she takes 2 tabs daily. I sent rx in electronically.

## 2014-08-21 ENCOUNTER — Other Ambulatory Visit: Payer: Self-pay | Admitting: *Deleted

## 2014-08-21 MED ORDER — DIAZEPAM 5 MG PO TABS: 2.5000 mg | ORAL_TABLET | Freq: Two times a day (BID) | ORAL | Status: DC | PRN

## 2014-08-21 NOTE — Telephone Encounter (Signed)
plz phone in. 

## 2014-08-21 NOTE — Telephone Encounter (Signed)
Rx called in as directed.   

## 2014-08-21 NOTE — Telephone Encounter (Signed)
#   60 was last filled on 07/20/14, pt's last ov was a hosp f/u on 07/12/14, and she has a f/u scheduled for 09/12/14.

## 2014-08-29 ENCOUNTER — Ambulatory Visit: Payer: Self-pay | Admitting: Oncology

## 2014-09-05 ENCOUNTER — Ambulatory Visit: Payer: Self-pay | Admitting: Pain Medicine

## 2014-09-09 ENCOUNTER — Emergency Department: Payer: Self-pay | Admitting: Emergency Medicine

## 2014-09-10 ENCOUNTER — Ambulatory Visit: Payer: Self-pay | Admitting: Oncology

## 2014-09-12 ENCOUNTER — Encounter: Payer: Self-pay | Admitting: Internal Medicine

## 2014-09-12 ENCOUNTER — Ambulatory Visit (INDEPENDENT_AMBULATORY_CARE_PROVIDER_SITE_OTHER): Payer: Medicare Other | Admitting: Internal Medicine

## 2014-09-12 VITALS — BP 118/60 | HR 75 | Temp 97.7°F

## 2014-09-12 DIAGNOSIS — G35 Multiple sclerosis: Secondary | ICD-10-CM

## 2014-09-12 DIAGNOSIS — E11610 Type 2 diabetes mellitus with diabetic neuropathic arthropathy: Secondary | ICD-10-CM

## 2014-09-12 DIAGNOSIS — Z23 Encounter for immunization: Secondary | ICD-10-CM

## 2014-09-12 DIAGNOSIS — F39 Unspecified mood [affective] disorder: Secondary | ICD-10-CM

## 2014-09-12 DIAGNOSIS — E1142 Type 2 diabetes mellitus with diabetic polyneuropathy: Secondary | ICD-10-CM

## 2014-09-12 DIAGNOSIS — G35D Multiple sclerosis, unspecified: Secondary | ICD-10-CM

## 2014-09-12 NOTE — Assessment & Plan Note (Signed)
Casted now Apparently still concerned about osteomyelitis since she got long term parenteral antibiotics I will try to get records from Drs Ola Spurr and Troxler

## 2014-09-12 NOTE — Progress Notes (Signed)
Subjective:    Patient ID: Madeline [name], female    DOB: [birth year], 63 y.o.   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 foot for Charcot joint Still with central line--but has finally finished her antibiotics Seeing Dr Elvina Mattes and Dr Ola Spurr --has follow up with both coming up  Still with place on left arm from grade school-- from pencil Still comes to a head at times Will drain white stuff--- flat now (discussed probably a cyst)  Checking sugars tid Adjusts dose but doesn't take the novolog recently due to all sugars under 150 Has adjusted her diet and eating better Can't tell if she lost weight  MS seems quiet Continues on the copaxone   Current Outpatient Prescriptions on File Prior to Visit  Medication Sig Dispense Refill  . albuterol (PROVENTIL) (2.5 MG/3ML) 0.083% nebulizer solution Take 3 mLs (2.5 mg total) by nebulization every 6 (six) hours as needed for wheezing or shortness of breath. 75 mL 11  . allopurinol (ZYLOPRIM) 300 MG tablet Take 1 tablet (300 mg total) by mouth daily. 90 tablet 3  . aspirin 325 MG tablet Take 325 mg by mouth daily.      . budesonide-formoterol (SYMBICORT) 160-4.5 MCG/ACT inhaler Inhale 1 puff into the lungs 2 (two) times daily. 10.2 g 5  . Calcium Carbonate-Vitamin D (CALCIUM-VITAMIN D) 600-200 MG-UNIT CAPS Take by mouth daily.      . carvedilol (COREG) 25 MG tablet Take 1 tablet (25 mg total) by mouth 2 (two) times daily with a meal. 180 tablet 1  . colchicine 0.6 MG tablet Take 1 tablet (0.6 mg total) by mouth 2 (two) times daily as needed. 60 tablet 11  . diazepam (VALIUM) 5 MG tablet Take 0.5-1 tablets (2.5-5 mg total) by mouth 2 (two) times daily as needed for anxiety. 60 tablet 0  . fish oil-omega-3 fatty acids 1000 MG capsule Take 2 g by mouth daily.      Marland Kitchen FLUoxetine (PROZAC) 20 MG capsule TAKE 2 CAPSULES BY MOUTH EVERY DAY 180 capsule 0  . furosemide (LASIX) 80 MG tablet  Take 1 tablet (80 mg total) by mouth daily. 180 tablet 0  . gabapentin (NEURONTIN) 300 MG capsule Take 8 tablets by mouth once daily    . Glatiramer Acetate (COPAXONE) 40 MG/ML SOSY Inject 40 mg into the skin 3 (three) times a week.    Marland Kitchen glucose blood (ONE TOUCH TEST STRIPS) test strip Use as instructed to test blood sugar 3 times daily dx: 250.60 300 each 3  . insulin aspart (NOVOLOG FLEXPEN) 100 UNIT/ML injection Sliding scale 10 pen 3  . Insulin Glargine (LANTUS SOLOSTAR) 100 UNIT/ML Solostar Pen INJECT 47 UNITS TWICE A DAY AS DIRECTED 45 mL 9  . Insulin Pen Needle 30G X 5 MM MISC Use as instructed to inject insulin 2-3 times daily dx 250.60 300 each 6  . levothyroxine (SYNTHROID, LEVOTHROID) 175 MCG tablet Take 1 tablet (175 mcg total) by mouth daily. 90 tablet 1  . lidocaine (LIDODERM) 5 % Place 1-2 patches onto the skin every 12 (twelve) hours. then remove for 12 hours, repeat if tolerated 30 patch 0  . meloxicam (MOBIC) 15 MG tablet Take 1 tablet (15 mg total) by mouth daily as needed. 90 tablet 3  . metFORMIN (GLUCOPHAGE) 1000 MG tablet TAKE ONE TABLET BY MOUTH TWICE DAILY WITH FOOD 180 tablet 1  . omeprazole (PRILOSEC) 20 MG capsule TAKE ONE CAPSULE  BY MOUTH TWICE DAILY 180 capsule 1  . ONE TOUCH LANCETS MISC Pt uses to test blood sugar 3 times daily dx 250.60 200 each 3  . oxyCODONE (OXY IR/ROXICODONE) 5 MG immediate release tablet Take 5 mg by mouth every 8 (eight) hours as needed.     . potassium chloride (KLOR-CON M10) 10 MEQ tablet TAKE 2 TABLETS BY MOUTH EVERY DAY 180 tablet 1  . Respiratory Therapy Supplies (NEBULIZER/TUBING/MOUTHPIECE) KIT Use as directed dx: 496 1 each 0  . tiotropium (SPIRIVA HANDIHALER) 18 MCG inhalation capsule PLACE 1 CAPSULE (18 MCG TOTAL) INTO INHALER AND INHALE DAILY AS NEEDED. 90 capsule 1  . tiZANidine (ZANAFLEX) 2 MG tablet TAKE 1 TO 2 TABLETS 3 TIMES A DAY AS NEEDED 180 tablet 1  . traZODone (DESYREL) 100 MG tablet Take 2 tabs at bedtime. 225 tablet 3     No current facility-administered medications on file prior to visit.    Allergies  Allergen Reactions  . Penicillins Swelling    tongue and throat swelling tolerates cephalosporins  . Rosiglitazone Maleate Swelling    Past Medical History  Diagnosis Date  . Depression   . Goiter, nodular     multi  . Dyslipidemia   . NIDDM, uncontrolled, with neuropathy   . Hypertension   . Allergy   . GERD (gastroesophageal reflux disease)   . Urinary incontinence   . Peripheral neuropathy   . Multiple sclerosis   . Breast cancer   . Hypothyroidism   . Asthma   . COPD (chronic obstructive pulmonary disease)   . DVT (deep venous thrombosis) 1990's    right leg  . Gout     Past Surgical History  Procedure Laterality Date  . Thyroidectomy  09/2004  . Transthoracic echocardiogram  05/16/2004  . Partial hysterectomy  1975  . Axillary hidradenitis excision  1993    Excision biopsy growth right axilla, benign   . Other surgical history      Thyroid biopsy 10/99  . Carpal tunnel release  03/2008    bilateral  . Mastectomy, radical  02/2009    left modified  . Tonsillectomy    . Appendectomy      Family History  Problem Relation Age of Onset  . Kidney failure Mother   . Heart disease Father 53  . Heart disease Brother   . Heart disease Brother     History   Social History  . Marital Status: Widowed    Spouse Name: N/A    Number of Children: 4  . 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 Smoking status: Former Smoker -- 0.50 packs/day    Types: Cigarettes    Quit date: 11/06/2013  . Smokeless tobacco: Never Used  . Alcohol Use: No  . Drug Use: No  . Sexual Activity: Not on file   Other Topics Concern  . Not on file   Social History Narrative   Widowed 1999 then 2nd Marriage--2000. Widowed again 2009   Living with daughter now   Has living will   Daughter [name] is health  care POA.   Would accept rescitation but no prolonged artificial ventilation   No feeding tube if cognitively unaware   Review of Systems Sleeps okay Appetite is off while on the antibiotics (due to nausea) Bowels are okay    Objective:   Physical Exam  Constitutional: No  distress.  Neck: Normal range of motion.  Cardiovascular: Normal rate, regular rhythm and normal heart sounds.  Exam reveals no gallop.   No murmur heard. Pulmonary/Chest: Effort normal and breath sounds normal. No respiratory distress. She has no wheezes. She has no rales.  Musculoskeletal:  Right foot in boot Thick calves without pitting  Lymphadenopathy:    She has no cervical adenopathy.  Psychiatric: She has a normal mood and affect. Her behavior is normal.          Assessment & Plan:

## 2014-09-12 NOTE — Assessment & Plan Note (Signed)
Sugars seem to be better--especially with control of foot process She states Dr Ola Spurr has checked labs--I will try to get these

## 2014-09-12 NOTE — Assessment & Plan Note (Signed)
Worsening neuropathic pain Not sure if diabetic or from her MS Continues on copaxone

## 2014-09-12 NOTE — Progress Notes (Signed)
Pre visit review using our clinic review tool, if applicable. No additional management support is needed unless otherwise documented below in the visit note. 

## 2014-09-12 NOTE — Assessment & Plan Note (Signed)
Mood better with improvement in foot

## 2014-09-12 NOTE — Addendum Note (Signed)
Addended by: Despina Hidden on: 09/12/2014 12:59 PM   Modules accepted: Orders

## 2014-09-15 ENCOUNTER — Other Ambulatory Visit: Payer: Self-pay | Admitting: *Deleted

## 2014-09-15 MED ORDER — TIZANIDINE HCL 2 MG PO TABS: ORAL_TABLET | ORAL | Status: DC

## 2014-09-15 NOTE — Telephone Encounter (Signed)
#  180 last filled 08/21/14, pt's last ov was 09/12/14.

## 2014-09-15 NOTE — Telephone Encounter (Signed)
rx sent to pharmacy by e-script  

## 2014-09-15 NOTE — Telephone Encounter (Signed)
Okay #180 x 1 

## 2014-09-18 ENCOUNTER — Telehealth: Payer: Self-pay | Admitting: Internal Medicine

## 2014-09-18 NOTE — Telephone Encounter (Signed)
Spoke with the patient about changing Home Health agencies. At this time her IV port has been removed and she is no longer needing services from [facility]. She is waiting to hear back from Dr Ola Spurr and will talk to him about changing agencies if she should need them again because he can order the [facility] for her if she needs the IV Port again.

## 2014-09-18 NOTE — Telephone Encounter (Signed)
Okay I did wind up signing the orders but he would know if she needs more services

## 2014-10-03 ENCOUNTER — Ambulatory Visit: Payer: Self-pay | Admitting: Pain Medicine

## 2014-10-03 ENCOUNTER — Encounter: Payer: Self-pay | Admitting: Neurology

## 2014-10-10 ENCOUNTER — Ambulatory Visit: Payer: Self-pay | Admitting: Oncology

## 2014-10-19 ENCOUNTER — Other Ambulatory Visit: Payer: Self-pay | Admitting: *Deleted

## 2014-10-19 MED ORDER — DIAZEPAM 5 MG PO TABS: 2.5000 mg | ORAL_TABLET | Freq: Two times a day (BID) | ORAL | Status: DC | PRN

## 2014-10-19 NOTE — Telephone Encounter (Signed)
rx called into pharmacy

## 2014-10-19 NOTE — Telephone Encounter (Signed)
Ok to refill? Last filled 08/21/14 #60 with 0RF

## 2014-10-19 NOTE — Telephone Encounter (Signed)
Approved: #60 x 0 

## 2014-10-30 ENCOUNTER — Ambulatory Visit: Payer: Self-pay | Admitting: Pain Medicine

## 2014-11-07 ENCOUNTER — Telehealth: Payer: Self-pay | Admitting: Internal Medicine

## 2014-11-07 NOTE — Telephone Encounter (Signed)
Left vm with patient. Asking if she has had her eye exam in the last 24 months. If so, where/ when

## 2014-11-10 ENCOUNTER — Ambulatory Visit: Payer: Self-pay | Admitting: Oncology

## 2014-11-13 ENCOUNTER — Encounter: Payer: Self-pay | Admitting: Internal Medicine

## 2014-11-13 ENCOUNTER — Encounter: Payer: Self-pay | Admitting: Radiology

## 2014-11-13 ENCOUNTER — Ambulatory Visit (INDEPENDENT_AMBULATORY_CARE_PROVIDER_SITE_OTHER): Payer: Medicare Other | Admitting: Internal Medicine

## 2014-11-13 VITALS — BP 120/80 | HR 70 | Temp 98.0°F

## 2014-11-13 DIAGNOSIS — E1142 Type 2 diabetes mellitus with diabetic polyneuropathy: Secondary | ICD-10-CM

## 2014-11-13 DIAGNOSIS — I10 Essential (primary) hypertension: Secondary | ICD-10-CM

## 2014-11-13 DIAGNOSIS — E785 Hyperlipidemia, unspecified: Secondary | ICD-10-CM

## 2014-11-13 LAB — COMPREHENSIVE METABOLIC PANEL
ALT: 13 U/L (ref 0–35)
AST: 21 U/L (ref 0–37)
Albumin: 3.7 g/dL (ref 3.5–5.2)
Alkaline Phosphatase: 81 U/L (ref 39–117)
BUN: 12 mg/dL (ref 6–23)
CHLORIDE: 103 meq/L (ref 96–112)
CO2: 33 meq/L — AB (ref 19–32)
CREATININE: 0.7 mg/dL (ref 0.4–1.2)
Calcium: 9.2 mg/dL (ref 8.4–10.5)
GFR: 89 mL/min (ref >60.00)
Glucose, Bld: 95 mg/dL (ref 70–99)
Potassium: 5.2 mEq/L — ABNORMAL HIGH (ref 3.5–5.1)
SODIUM: 143 meq/L (ref 135–145)
TOTAL PROTEIN: 7 g/dL (ref 6.0–8.3)
Total Bilirubin: 0.4 mg/dL (ref 0.2–1.2)

## 2014-11-13 LAB — LIPID PANEL
Cholesterol: 197 mg/dL (ref 0–200)
HDL: 38 mg/dL — AB (ref >39.00)
NonHDL: 159
Total CHOL/HDL Ratio: 5
Triglycerides: 294 mg/dL — ABNORMAL HIGH (ref 0.0–149.0)
VLDL: 58.8 mg/dL — AB (ref 0.0–40.0)

## 2014-11-13 LAB — MICROALBUMIN / CREATININE URINE RATIO
Creatinine,U: 80.7 mg/dL
MICROALB/CREAT RATIO: 0.6 mg/g (ref 0.0–30.0)
Microalb, Ur: 0.5 mg/dL (ref 0.0–1.9)

## 2014-11-13 LAB — HEMOGLOBIN A1C: HEMOGLOBIN A1C: 6.8 % — AB (ref 4.6–6.5)

## 2014-11-13 NOTE — Assessment & Plan Note (Signed)
She is not willing to take statin On fish oil Will just recheck

## 2014-11-13 NOTE — Assessment & Plan Note (Signed)
Seems to have fair control Goal under 8%--- action if over 9% She doesn't want any other medications

## 2014-11-13 NOTE — Assessment & Plan Note (Signed)
BP Readings from Last 3 Encounters:  11/13/14 120/80  09/12/14 118/60  07/12/14 120/80   Has been fine with her beta blocker for long time Will just check urine microal Start ARB if positive

## 2014-11-13 NOTE — Progress Notes (Signed)
Subjective:    Patient ID: Madeline [name], female    DOB: Dec 08, 1950, 64 y.o.   MRN: [id number]  HPI Here with daughter For follow up on multiple medical problems  Still in boot for right foot Might need to be recasted Does have heel fracture as well---this is new (got up and put weight on it without her boot)  Needs help with shower--though she does have a shower chair Mostly does "bed baths" mostly Dresses herself==but needs help getting pants over her boot  Diabetes is better Last A1c was just after vacation in [place] No hypoglycemic spells  Breathing is okay  [employer] has contacted her about why she isn't on ACEI  Current Outpatient Prescriptions on File Prior to Visit  Medication Sig Dispense Refill  . albuterol (PROVENTIL) (2.5 MG/3ML) 0.083% nebulizer solution Take 3 mLs (2.5 mg total) by nebulization every 6 (six) hours as needed for wheezing or shortness of breath. 75 mL 11  . allopurinol (ZYLOPRIM) 300 MG tablet Take 1 tablet (300 mg total) by mouth daily. 90 tablet 3  . aspirin 325 MG tablet Take 325 mg by mouth daily.      . budesonide-formoterol (SYMBICORT) 160-4.5 MCG/ACT inhaler Inhale 1 puff into the lungs 2 (two) times daily. 10.2 g 5  . Calcium Carbonate-Vitamin D (CALCIUM-VITAMIN D) 600-200 MG-UNIT CAPS Take by mouth daily.      . carvedilol (COREG) 25 MG tablet Take 1 tablet (25 mg total) by mouth 2 (two) times daily with a meal. 180 tablet 1  . colchicine 0.6 MG tablet Take 1 tablet (0.6 mg total) by mouth 2 (two) times daily as needed. 60 tablet 11  . diazepam (VALIUM) 5 MG tablet Take 0.5-1 tablets (2.5-5 mg total) by mouth 2 (two) times daily as needed for anxiety. 60 tablet 0  . fish oil-omega-3 fatty acids 1000 MG capsule Take 2 g by mouth daily.      Marland Kitchen FLUoxetine (PROZAC) 20 MG capsule TAKE 2 CAPSULES BY MOUTH EVERY DAY 180 capsule 0  . furosemide (LASIX) 80 MG tablet Take 1 tablet (80 mg total) by mouth daily. 180 tablet 0  . gabapentin (NEURONTIN)  300 MG capsule Take 8 tablets by mouth once daily    . Glatiramer Acetate (COPAXONE) 40 MG/ML SOSY Inject 40 mg into the skin 3 (three) times a week.    Marland Kitchen glucose blood (ONE TOUCH TEST STRIPS) test strip Use as instructed to test blood sugar 3 times daily dx: 250.60 300 each 3  . insulin aspart (NOVOLOG FLEXPEN) 100 UNIT/ML injection Sliding scale 10 pen 3  . Insulin Glargine (LANTUS SOLOSTAR) 100 UNIT/ML Solostar Pen INJECT 47 UNITS TWICE A DAY AS DIRECTED 45 mL 9  . Insulin Pen Needle 30G X 5 MM MISC Use as instructed to inject insulin 2-3 times daily dx 250.60 300 each 6  . levothyroxine (SYNTHROID, LEVOTHROID) 175 MCG tablet Take 1 tablet (175 mcg total) by mouth daily. 90 tablet 1  . lidocaine (LIDODERM) 5 % Place 1-2 patches onto the skin every 12 (twelve) hours. then remove for 12 hours, repeat if tolerated 30 patch 0  . meloxicam (MOBIC) 15 MG tablet Take 1 tablet (15 mg total) by mouth daily as needed. 90 tablet 3  . metFORMIN (GLUCOPHAGE) 1000 MG tablet TAKE ONE TABLET BY MOUTH TWICE DAILY WITH FOOD 180 tablet 1  . omeprazole (PRILOSEC) 20 MG capsule TAKE ONE CAPSULE BY MOUTH TWICE DAILY 180 capsule 1  . ONE TOUCH LANCETS MISC Pt  uses to test blood sugar 3 times daily dx 250.60 200 each 3  . oxyCODONE (OXY IR/ROXICODONE) 5 MG immediate release tablet Take 5 mg by mouth every 8 (eight) hours as needed.     . potassium chloride (KLOR-CON M10) 10 MEQ tablet TAKE 2 TABLETS BY MOUTH EVERY DAY 180 tablet 1  . Respiratory Therapy Supplies (NEBULIZER/TUBING/MOUTHPIECE) KIT Use as directed dx: 496 1 each 0  . tiotropium (SPIRIVA HANDIHALER) 18 MCG inhalation capsule PLACE 1 CAPSULE (18 MCG TOTAL) INTO INHALER AND INHALE DAILY AS NEEDED. 90 capsule 1  . tiZANidine (ZANAFLEX) 2 MG tablet TAKE 1 TO 2 TABLETS 3 TIMES A DAY AS NEEDED 180 tablet 1  . traZODone (DESYREL) 100 MG tablet Take 2 tabs at bedtime. 225 tablet 3   No current facility-administered medications on file prior to visit.     Allergies  Allergen Reactions  . Penicillins Swelling    tongue and throat swelling tolerates cephalosporins  . Rosiglitazone Maleate Swelling    Past Medical History  Diagnosis Date  . Depression   . Goiter, nodular     multi  . Dyslipidemia   . NIDDM, uncontrolled, with neuropathy   . Hypertension   . Allergy   . GERD (gastroesophageal reflux disease)   . Urinary incontinence   . Peripheral neuropathy   . Multiple sclerosis   . Breast cancer   . Hypothyroidism   . Asthma   . COPD (chronic obstructive pulmonary disease)   . DVT (deep venous thrombosis) 1990's    right leg  . Gout     Past Surgical History  Procedure Laterality Date  . Thyroidectomy  09/2004  . Transthoracic echocardiogram  05/16/2004  . Partial hysterectomy  1975  . Axillary hidradenitis excision  1993    Excision biopsy growth right axilla, benign   . Other surgical history      Thyroid biopsy 10/99  . Carpal tunnel release  03/2008    bilateral  . Mastectomy, radical  02/2009    left modified  . Tonsillectomy    . Appendectomy      Family History  Problem Relation Age of Onset  . Kidney failure Mother   . Heart disease Father 4  . Heart disease Brother   . Heart disease Brother     History   Social History  . Marital Status: Widowed    Spouse Name: N/A    Number of Children: 4  . 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 Smoking status: Former Smoker -- 0.50 packs/day    Types: Cigarettes    Quit date: 11/06/2013  . Smokeless tobacco: Never Used  . Alcohol Use: No  . Drug Use: No  . Sexual Activity: Not on file   Other Topics Concern  . Not on file   Social History Narrative   Widowed 1999 then 2nd Marriage--2000. Widowed again 2009   Living with daughter now   Has living will   Daughter [name] is health care POA.   Would accept rescitation but no prolonged artificial  ventilation   No feeding tube if cognitively unaware   Review of Systems  Sleeps okay---uses oxygen at night Appetite is good Weight seems stable--unable to stand on the scale     Objective:   Physical Exam  Constitutional: She appears well-developed and well-nourished. No distress.  Neck: Normal range of motion.  Neck supple. No thyromegaly present.  Cardiovascular: Normal rate and normal heart sounds.  Exam reveals no gallop.   No murmur heard. Pulmonary/Chest: Breath sounds normal. No respiratory distress. She has no wheezes. She has no rales.  Lymphadenopathy:    She has no cervical adenopathy.  Psychiatric: She has a normal mood and affect. Her behavior is normal.          Assessment & Plan:

## 2014-11-14 ENCOUNTER — Other Ambulatory Visit: Payer: Self-pay | Admitting: Internal Medicine

## 2014-11-14 ENCOUNTER — Telehealth: Payer: Self-pay

## 2014-11-14 DIAGNOSIS — E1142 Type 2 diabetes mellitus with diabetic polyneuropathy: Secondary | ICD-10-CM

## 2014-11-14 DIAGNOSIS — E118 Type 2 diabetes mellitus with unspecified complications: Secondary | ICD-10-CM

## 2014-11-14 LAB — LDL CHOLESTEROL, DIRECT: Direct LDL: 115.4 mg/dL

## 2014-11-14 NOTE — Telephone Encounter (Signed)
LVM for pt to call back after I received VM returning my call.   Pt called back. Pt stated that she has not been since 2013. Pt wanted a referral for an eye exam.   Eye exam referral has been entered.

## 2014-11-14 NOTE — Telephone Encounter (Signed)
LVM for pt to call back.

## 2014-11-15 NOTE — Progress Notes (Signed)
Order placed

## 2014-11-15 NOTE — Addendum Note (Signed)
Addended by: Viviana Simpler I on: 11/15/2014 07:46 AM   Modules accepted: Orders

## 2014-11-21 DIAGNOSIS — M14671 Charcot's joint, right ankle and foot: Secondary | ICD-10-CM | POA: Diagnosis not present

## 2014-11-21 DIAGNOSIS — E11610 Type 2 diabetes mellitus with diabetic neuropathic arthropathy: Secondary | ICD-10-CM | POA: Diagnosis not present

## 2014-11-21 DIAGNOSIS — M84474S Pathological fracture, right foot, sequela: Secondary | ICD-10-CM | POA: Diagnosis not present

## 2014-11-29 ENCOUNTER — Ambulatory Visit: Payer: Self-pay | Admitting: Pain Medicine

## 2014-11-29 DIAGNOSIS — M546 Pain in thoracic spine: Secondary | ICD-10-CM | POA: Diagnosis not present

## 2014-11-29 DIAGNOSIS — Z79899 Other long term (current) drug therapy: Secondary | ICD-10-CM | POA: Diagnosis not present

## 2014-11-29 DIAGNOSIS — R51 Headache: Secondary | ICD-10-CM | POA: Diagnosis not present

## 2014-11-29 DIAGNOSIS — M47817 Spondylosis without myelopathy or radiculopathy, lumbosacral region: Secondary | ICD-10-CM | POA: Diagnosis not present

## 2014-11-29 DIAGNOSIS — M47892 Other spondylosis, cervical region: Secondary | ICD-10-CM | POA: Diagnosis not present

## 2014-11-29 DIAGNOSIS — Z79891 Long term (current) use of opiate analgesic: Secondary | ICD-10-CM | POA: Diagnosis not present

## 2014-11-29 DIAGNOSIS — M47894 Other spondylosis, thoracic region: Secondary | ICD-10-CM | POA: Diagnosis not present

## 2014-11-29 DIAGNOSIS — F1120 Opioid dependence, uncomplicated: Secondary | ICD-10-CM | POA: Diagnosis not present

## 2014-11-29 DIAGNOSIS — G894 Chronic pain syndrome: Secondary | ICD-10-CM | POA: Diagnosis not present

## 2014-11-29 DIAGNOSIS — E1340 Other specified diabetes mellitus with diabetic neuropathy, unspecified: Secondary | ICD-10-CM | POA: Diagnosis not present

## 2014-11-29 DIAGNOSIS — M47896 Other spondylosis, lumbar region: Secondary | ICD-10-CM | POA: Diagnosis not present

## 2014-11-29 DIAGNOSIS — M533 Sacrococcygeal disorders, not elsewhere classified: Secondary | ICD-10-CM | POA: Diagnosis not present

## 2014-11-29 DIAGNOSIS — E119 Type 2 diabetes mellitus without complications: Secondary | ICD-10-CM | POA: Diagnosis not present

## 2014-12-07 DIAGNOSIS — J449 Chronic obstructive pulmonary disease, unspecified: Secondary | ICD-10-CM | POA: Diagnosis not present

## 2014-12-08 DIAGNOSIS — N63 Unspecified lump in breast: Secondary | ICD-10-CM | POA: Diagnosis not present

## 2014-12-08 DIAGNOSIS — Z9223 Personal history of estrogen therapy: Secondary | ICD-10-CM | POA: Diagnosis not present

## 2014-12-08 DIAGNOSIS — Z9012 Acquired absence of left breast and nipple: Secondary | ICD-10-CM | POA: Diagnosis not present

## 2014-12-08 DIAGNOSIS — Z1239 Encounter for other screening for malignant neoplasm of breast: Secondary | ICD-10-CM | POA: Diagnosis not present

## 2014-12-08 DIAGNOSIS — Z853 Personal history of malignant neoplasm of breast: Secondary | ICD-10-CM | POA: Diagnosis not present

## 2014-12-08 DIAGNOSIS — Z452 Encounter for adjustment and management of vascular access device: Secondary | ICD-10-CM | POA: Diagnosis not present

## 2014-12-08 DIAGNOSIS — Z170 Estrogen receptor positive status [ER+]: Secondary | ICD-10-CM | POA: Diagnosis not present

## 2014-12-08 DIAGNOSIS — R928 Other abnormal and inconclusive findings on diagnostic imaging of breast: Secondary | ICD-10-CM | POA: Diagnosis not present

## 2014-12-11 ENCOUNTER — Ambulatory Visit: Payer: Self-pay | Admitting: Oncology

## 2014-12-15 ENCOUNTER — Other Ambulatory Visit: Payer: Self-pay | Admitting: *Deleted

## 2014-12-15 MED ORDER — FLUOXETINE HCL 20 MG PO CAPS: ORAL_CAPSULE | ORAL | Status: DC

## 2014-12-15 MED ORDER — CARVEDILOL 25 MG PO TABS: 25.0000 mg | ORAL_TABLET | Freq: Two times a day (BID) | ORAL | Status: DC

## 2014-12-15 MED ORDER — POTASSIUM CHLORIDE CRYS ER 10 MEQ PO TBCR: EXTENDED_RELEASE_TABLET | ORAL | Status: DC

## 2014-12-15 MED ORDER — LEVOTHYROXINE SODIUM 175 MCG PO TABS: 175.0000 ug | ORAL_TABLET | Freq: Every day | ORAL | Status: DC

## 2014-12-15 MED ORDER — FUROSEMIDE 80 MG PO TABS: 80.0000 mg | ORAL_TABLET | Freq: Every day | ORAL | Status: DC

## 2014-12-15 NOTE — Telephone Encounter (Signed)
Received faxed rx refill [facility]. Sent in the rx as requested.

## 2014-12-15 NOTE — Addendum Note (Signed)
Addended by: Marchia Bond on: 12/15/2014 03:12 PM   Modules accepted: Orders

## 2014-12-16 ENCOUNTER — Emergency Department: Payer: Self-pay | Admitting: Emergency Medicine

## 2014-12-16 DIAGNOSIS — E109 Type 1 diabetes mellitus without complications: Secondary | ICD-10-CM | POA: Diagnosis not present

## 2014-12-16 DIAGNOSIS — Z880 Allergy status to penicillin: Secondary | ICD-10-CM | POA: Diagnosis not present

## 2014-12-16 DIAGNOSIS — R110 Nausea: Secondary | ICD-10-CM | POA: Diagnosis not present

## 2014-12-16 DIAGNOSIS — R0602 Shortness of breath: Secondary | ICD-10-CM | POA: Diagnosis not present

## 2014-12-16 DIAGNOSIS — R531 Weakness: Secondary | ICD-10-CM | POA: Diagnosis not present

## 2014-12-16 DIAGNOSIS — R61 Generalized hyperhidrosis: Secondary | ICD-10-CM | POA: Diagnosis not present

## 2014-12-16 DIAGNOSIS — R42 Dizziness and giddiness: Secondary | ICD-10-CM | POA: Diagnosis not present

## 2014-12-16 DIAGNOSIS — M6281 Muscle weakness (generalized): Secondary | ICD-10-CM | POA: Diagnosis not present

## 2014-12-16 DIAGNOSIS — Z853 Personal history of malignant neoplasm of breast: Secondary | ICD-10-CM | POA: Diagnosis not present

## 2014-12-16 DIAGNOSIS — R202 Paresthesia of skin: Secondary | ICD-10-CM | POA: Diagnosis not present

## 2014-12-16 DIAGNOSIS — I10 Essential (primary) hypertension: Secondary | ICD-10-CM | POA: Diagnosis not present

## 2014-12-16 LAB — BASIC METABOLIC PANEL
ANION GAP: 8 (ref 7–16)
BUN: 14 mg/dL (ref 7–18)
CO2: 30 mmol/L (ref 21–32)
CREATININE: 0.94 mg/dL (ref 0.60–1.30)
Calcium, Total: 8.3 mg/dL — ABNORMAL LOW (ref 8.5–10.1)
Chloride: 99 mmol/L (ref 98–107)
EGFR (African American): >60
EGFR (Non-African Amer.): >60
Glucose: 166 mg/dL — ABNORMAL HIGH (ref 65–99)
Osmolality: 278 (ref 275–301)
Potassium: 4.3 mmol/L (ref 3.5–5.1)
SODIUM: 137 mmol/L (ref 136–145)

## 2014-12-16 LAB — CBC WITH DIFFERENTIAL/PLATELET
BASOS ABS: 0.1 10*3/uL (ref 0.0–0.1)
BASOS PCT: 1.4 %
EOS ABS: 0.4 10*3/uL (ref 0.0–0.7)
Eosinophil %: 6.4 %
HCT: 37.2 % (ref 35.0–47.0)
HGB: 11.4 g/dL — AB (ref 12.0–16.0)
LYMPHS PCT: 26.1 %
Lymphocyte #: 1.7 10*3/uL (ref 1.0–3.6)
MCH: 24.9 pg — ABNORMAL LOW (ref 26.0–34.0)
MCHC: 30.8 g/dL — ABNORMAL LOW (ref 32.0–36.0)
MCV: 81 fL (ref 80–100)
Monocyte #: 0.8 x10 3/mm (ref 0.2–0.9)
Monocyte %: 11.8 %
NEUTROS PCT: 54.3 %
Neutrophil #: 3.5 10*3/uL (ref 1.4–6.5)
PLATELETS: 228 10*3/uL (ref 150–440)
RBC: 4.59 10*6/uL (ref 3.80–5.20)
RDW: 19 % — AB (ref 11.5–14.5)
WBC: 6.5 10*3/uL (ref 3.6–11.0)

## 2014-12-16 LAB — URINALYSIS, COMPLETE
BILIRUBIN, UR: NEGATIVE
Bacteria: NONE SEEN
Blood: NEGATIVE
Glucose,UR: NEGATIVE mg/dL (ref 0–75)
KETONE: NEGATIVE
Leukocyte Esterase: NEGATIVE
Nitrite: NEGATIVE
PROTEIN: NEGATIVE
Ph: 7 (ref 4.5–8.0)
RBC, UR: NONE SEEN /HPF (ref 0–5)
Specific Gravity: 1.005 (ref 1.003–1.030)

## 2014-12-16 LAB — TROPONIN I

## 2014-12-16 LAB — D-DIMER(ARMC): D-DIMER: 954 ng/mL

## 2014-12-19 ENCOUNTER — Other Ambulatory Visit: Payer: Self-pay | Admitting: *Deleted

## 2014-12-19 MED ORDER — INSULIN ASPART 100 UNIT/ML FLEXPEN: PEN_INJECTOR | SUBCUTANEOUS | Status: DC

## 2014-12-19 NOTE — Telephone Encounter (Signed)
#   60 Diazepam last filled on 10/19/2014, pt's last f/u was 11/13/14.

## 2014-12-20 ENCOUNTER — Ambulatory Visit (INDEPENDENT_AMBULATORY_CARE_PROVIDER_SITE_OTHER): Payer: Medicare Other | Admitting: Nurse Practitioner

## 2014-12-20 ENCOUNTER — Encounter: Payer: Self-pay | Admitting: Nurse Practitioner

## 2014-12-20 ENCOUNTER — Ambulatory Visit: Payer: Medicare Other | Admitting: Nurse Practitioner

## 2014-12-20 ENCOUNTER — Other Ambulatory Visit: Payer: Self-pay | Admitting: Neurology

## 2014-12-20 VITALS — BP 135/78 | HR 68 | Temp 96.0°F | Ht 62.0 in | Wt 258.8 lb

## 2014-12-20 DIAGNOSIS — R269 Unspecified abnormalities of gait and mobility: Secondary | ICD-10-CM

## 2014-12-20 DIAGNOSIS — G35 Multiple sclerosis: Secondary | ICD-10-CM | POA: Diagnosis not present

## 2014-12-20 DIAGNOSIS — R413 Other amnesia: Secondary | ICD-10-CM | POA: Diagnosis not present

## 2014-12-20 MED ORDER — DIAZEPAM 5 MG PO TABS: 2.5000 mg | ORAL_TABLET | Freq: Two times a day (BID) | ORAL | Status: DC | PRN

## 2014-12-20 NOTE — Telephone Encounter (Signed)
Rx called in to pharmacy. 

## 2014-12-20 NOTE — Patient Instructions (Signed)
Continue Copaxone 3 times a week Will get needle dispenser Continue magnesium for cramps Use walker for safe ambulation Follow-up in one year

## 2014-12-20 NOTE — Progress Notes (Signed)
I have read the note, and I agree with the clinical assessment and plan.  Madeline [name]   

## 2014-12-20 NOTE — Progress Notes (Signed)
[facility]  PATIENT: Madeline [name] DOB: 03-31-1951   REASON FOR VISIT: Multiple sclerosis, gait abnormality, memory loss  HISTORY FROM:patient, daughter    HISTORY OF PRESENT ILLNESS:Madeline [name], 64 year old female returns for followup. She was last seen in this office 12/16/2013. She has a history of multiple sclerosis. She had repeat MRI of the brain in May 2014 which was without change from her previous MRI in 2009. She has multiple medical problems to include insulin-dependent diabetes which is  in good control, most recent A1c 6.8. She has history of peripheral neuropathy related to her diabetes, chronic pain syndrome treated at the pain clinic, and osteoarthritis. She also has gout . She has COPD significant, peripheral edema ,and hypertension and morbid obesity. She is currently on Copaxone 40 mg 3 times weekly. She denies any injection site issues. Magnesium was given for her complaints of muscle cramping with good results. Her memory loss is unchanged. She also has depression and is on Prozac. She was seen in the hospital last week for dehydration and given a couple liters of fluids. She returns for reevaluation.   HISTORY: 64 year old right-handed white female with a history of morbid obesity, diabetes, and multiple sclerosis. The patient has been on Copaxone for number of years, and she is tolerating the medication well. Her last MRI of the brain was done in 2009. The patient was last seen through this office in December of 2011. The patient indicates that she has not had any new deficits with numbness or weakness of the face, arms, or legs. The patient denies any problems controlling the bowels or the bladder, and she denies any significant balance issues. The patient does have chronic low back pain, and she is followed through a pain Center. The patient has not had any visual complaints. The patient did have a fall while dancing in December 2013. The patient did  not sustain significant injury. The patient broke her left leg in 2012, and this required a prolonged recovery period. The patient recently was treated for an ulcer on her left big toe. The patient returns for an evaluation.    REVIEW OF SYSTEMS: Full 14 system review of systems performed and notable only for those listed, all others are neg:  Constitutional: Fatigue Cardiovascular: neg Ear/Nose/Throat: neg  Skin: neg Eyes: neg Respiratory: COPD  Gastroitestinal: Urgency  Hematology/Lymphatic: neg  Endocrine: neg Musculoskeletal: Joint pain, gait abnormality  Allergy/Immunology: neg Neurological: Memory loss, dizziness Psychiatric: Depression and anxiety Sleep : neg   ALLERGIES: Allergies  Allergen Reactions  . Penicillins Swelling    tongue and throat swelling tolerates cephalosporins  . Rosiglitazone Maleate Swelling    HOME MEDICATIONS: Outpatient Prescriptions Prior to Visit  Medication Sig Dispense Refill  . albuterol (PROVENTIL) (2.5 MG/3ML) 0.083% nebulizer solution Take 3 mLs (2.5 mg total) by nebulization every 6 (six) hours as needed for wheezing or shortness of breath. 75 mL 11  . allopurinol (ZYLOPRIM) 300 MG tablet Take 1 tablet (300 mg total) by mouth daily. 90 tablet 3  . aspirin 325 MG tablet Take 325 mg by mouth daily.      . budesonide-formoterol (SYMBICORT) 160-4.5 MCG/ACT inhaler Inhale 1 puff into the lungs 2 (two) times daily. 10.2 g 5  . Calcium Carbonate-Vitamin D (CALCIUM-VITAMIN D) 600-200 MG-UNIT CAPS Take by mouth daily.      . carvedilol (COREG) 25 MG tablet Take 1 tablet (25 mg total) by mouth 2 (two) times daily with a meal. 180 tablet 1  .  colchicine 0.6 MG tablet Take 1 tablet (0.6 mg total) by mouth 2 (two) times daily as needed. 60 tablet 11  . fish oil-omega-3 fatty acids 1000 MG capsule Take 2 g by mouth daily.      Marland Kitchen FLUoxetine (PROZAC) 20 MG capsule TAKE 2 CAPSULES BY MOUTH EVERY DAY 180 capsule 0  . furosemide (LASIX) 80 MG tablet Take  1 tablet (80 mg total) by mouth daily. Take second dose around lunchtime if your legs are swelling (Patient taking differently: Take 40 mg by mouth daily. Take second dose around lunchtime if your legs are swelling) 180 tablet 0  . gabapentin (NEURONTIN) 300 MG capsule Take 8 tablets by mouth once daily    . Glatiramer Acetate (COPAXONE) 40 MG/ML SOSY Inject 40 mg into the skin 3 (three) times a week.    Marland Kitchen glucose blood (ONE TOUCH TEST STRIPS) test strip Use as instructed to test blood sugar 3 times daily dx: 250.60 300 each 3  . insulin aspart (NOVOLOG FLEXPEN) 100 UNIT/ML injection Sliding scale 10 pen 3  . insulin aspart (NOVOLOG) 100 UNIT/ML FlexPen Use according to sliding scale as directed 15 mL 3  . Insulin Glargine (LANTUS SOLOSTAR) 100 UNIT/ML Solostar Pen INJECT 47 UNITS TWICE A DAY AS DIRECTED 45 mL 9  . Insulin Pen Needle 30G X 5 MM MISC Use as instructed to inject insulin 2-3 times daily dx 250.60 300 each 6  . levothyroxine (SYNTHROID, LEVOTHROID) 175 MCG tablet Take 1 tablet (175 mcg total) by mouth daily. 90 tablet 1  . lidocaine (LIDODERM) 5 % Place 1-2 patches onto the skin every 12 (twelve) hours. then remove for 12 hours, repeat if tolerated 30 patch 0  . meloxicam (MOBIC) 15 MG tablet Take 1 tablet (15 mg total) by mouth daily as needed. (Patient taking differently: Take 15 mg by mouth daily. ) 90 tablet 3  . metFORMIN (GLUCOPHAGE) 1000 MG tablet TAKE ONE TABLET BY MOUTH TWICE DAILY WITH FOOD 180 tablet 1  . omeprazole (PRILOSEC) 20 MG capsule TAKE ONE CAPSULE BY MOUTH TWICE DAILY 180 capsule 1  . ONE TOUCH LANCETS MISC Pt uses to test blood sugar 3 times daily dx 250.60 200 each 3  . oxyCODONE (OXY IR/ROXICODONE) 5 MG immediate release tablet Take 5 mg by mouth every 8 (eight) hours as needed.     . potassium chloride (KLOR-CON M10) 10 MEQ tablet TAKE 2 TABLETS BY MOUTH EVERY DAY 180 tablet 1  . Respiratory Therapy Supplies (NEBULIZER/TUBING/MOUTHPIECE) KIT Use as directed dx:  496 1 each 0  . tiotropium (SPIRIVA HANDIHALER) 18 MCG inhalation capsule PLACE 1 CAPSULE (18 MCG TOTAL) INTO INHALER AND INHALE DAILY AS NEEDED. 90 capsule 1  . tiZANidine (ZANAFLEX) 2 MG tablet TAKE 1 TO 2 TABLETS 3 TIMES A DAY AS NEEDED 180 tablet 1  . traZODone (DESYREL) 100 MG tablet Take 2 tabs at bedtime. 225 tablet 3  . diazepam (VALIUM) 5 MG tablet Take 0.5-1 tablets (2.5-5 mg total) by mouth 2 (two) times daily as needed for anxiety. 60 tablet 0   No facility-administered medications prior to visit.    PAST MEDICAL HISTORY: Past Medical History  Diagnosis Date  . Depression   . Goiter, nodular     multi  . Dyslipidemia   . NIDDM, uncontrolled, with neuropathy   . Hypertension   . Allergy   . GERD (gastroesophageal reflux disease)   . Urinary incontinence   . Peripheral neuropathy   . Multiple sclerosis   .  Breast cancer   . Hypothyroidism   . Asthma   . COPD (chronic obstructive pulmonary disease)   . DVT (deep venous thrombosis) 1990's    right leg  . Gout     PAST SURGICAL HISTORY: Past Surgical History  Procedure Laterality Date  . Thyroidectomy  09/2004  . Transthoracic echocardiogram  05/16/2004  . Partial hysterectomy  1975  . Axillary hidradenitis excision  1993    Excision biopsy growth right axilla, benign   . Other surgical history      Thyroid biopsy 10/99  . Carpal tunnel release  03/2008    bilateral  . Mastectomy, radical  02/2009    left modified  . Tonsillectomy    . Appendectomy      FAMILY HISTORY: Family History  Problem Relation Age of Onset  . Kidney failure Mother   . Heart disease Father 33  . Heart disease Brother   . Heart disease Brother     SOCIAL HISTORY: History   Social History  . Marital Status: Widowed    Spouse Name: N/A  . Number of Children: 4  . Years of Education: N/A   Occupational History  . [employer] bondsman     Getting back to work now   Social History Main Topics  . Smoking status: Former Smoker  -- 0.50 packs/day    Types: Cigarettes    Quit date: 11/06/2013  . Smokeless tobacco: Never Used  . Alcohol Use: No  . Drug Use: No  . Sexual Activity: Not on file   Other Topics Concern  . Not on file   Social History Narrative   Widowed 1999 then 2nd Marriage--2000. Widowed again 2009   Living with daughter now   Has living will   Daughter [name] is health care POA.   Would accept rescitation but no prolonged artificial ventilation   No feeding tube if cognitively unaware     PHYSICAL EXAM  Filed Vitals:   12/20/14 0926  BP: 135/78  Pulse: 68  Temp: 96 F (35.6 C)  TempSrc: Oral  Height: 5\' 2"  (1.575 m)  Weight: 258 lb 12.8 oz (117.391 kg)   Body mass index is 47.32 kg/(m^2). Well developed, morbidly obese female in no acute distress  Head: normocephalic and atraumatic,. Oropharynx benign  Neck: Supple, no carotid bruits  Cardiac: Regular rate rhythm, no murmur  Musculoskeletal: No deformity  Skin 2+ edema in both lower legs  Neurological examination   Mentation: Alert oriented to time, place, history taking. Follows all commands speech and language fluent. MMSE 29/30 missing 1 recall question. AFT 14. GDS =6  Cranial nerve II-XII: Fundoscopic exam reveals sharp disc margins.visual acuity 20/40 right 20/50 left .Pupils were equal round reactive to light extraocular movements were full, visual field were full on confrontational test. Facial sensation and strength were normal. hearing was intact to finger rubbing bilaterally. Uvula tongue midline. head turning and shoulder shrug were normal and symmetric.Tongue protrusion into cheek strength was normal. Motor: normal bulk and tone, full strength in the BUE, BLE, No focal weakness Coordination: finger-nose-finger, heel-to-shin bilaterally, no dysmetria Reflexes: Depressed upper and lower and symmetric Gait and Station: In wheelchair not ambulated due to safety concerns.  DIAGNOSTIC DATA (LABS, IMAGING,  TESTING) - I reviewed patient records, labs, notes, testing and imaging myself where available.  Lab Results  Component Value Date   WBC 10.8* 11/17/2013   HGB 12.7 11/17/2013   HCT 40.0 11/17/2013   MCV 80.4 11/17/2013   PLT 255.0  11/17/2013      Component Value Date/Time   NA 143 11/13/2014 1151   K 5.2* 11/13/2014 1151   CL 103 11/13/2014 1151   CO2 33* 11/13/2014 1151   GLUCOSE 95 11/13/2014 1151   BUN 12 11/13/2014 1151   CREATININE 0.7 11/13/2014 1151   CALCIUM 9.2 11/13/2014 1151   PROT 7.0 11/13/2014 1151   ALBUMIN 3.7 11/13/2014 1151   AST 21 11/13/2014 1151   ALT 13 11/13/2014 1151   ALKPHOS 81 11/13/2014 1151   BILITOT 0.4 11/13/2014 1151   GFRNONAA 72.10 08/28/2010 1258   GFRAA 55 12/31/2009   Lab Results  Component Value Date   CHOL 197 11/13/2014   HDL 38.00* 11/13/2014   LDLCALC * 01/18/2009    121        Total Cholesterol/HDL:CHD Risk Coronary Heart Disease Risk Table                     Men   Women  1/2 Average Risk   3.4   3.3  Average Risk       5.0   4.4  2 X Average Risk   9.6   7.1  3 X Average Risk  23.4   11.0        Use the calculated Patient Ratio above and the CHD Risk Table to determine the patient's CHD Risk.        ATP III CLASSIFICATION (LDL):  <100     mg/dL   Optimal  100-129  mg/dL   Near or Above                    Optimal  130-159  mg/dL   Borderline  160-189  mg/dL   High  >190     mg/dL   Very High   LDLDIRECT 115.4 11/13/2014   TRIG 294.0* 11/13/2014   CHOLHDL 5 11/13/2014   Lab Results  Component Value Date   HGBA1C 6.8* 11/13/2014       ASSESSMENT AND PLAN  64 y.o. year old female  has a past medical history of Depression;  Dyslipidemia; NIDDM,  with neuropathy; Hypertension; Allergy; Urinary incontinence; Peripheral neuropathy; Multiple sclerosis; COPD (chronic obstructive pulmonary disease); DVT (deep venous thrombosis) (1990's); and Gout. gait disorder and mild cognitive impairment here to follow-up. MMSE  29 out of 30. Geriatric depression scale 6. AFT 14. Recent ER visit for dehydration.   Continue Copaxone 3 times a week To prevent recurrent dehydration drink 8-10 glasses of water daily, avoid caffeinated products such as coffee and tea and sodas Continue Prozac for depression Continue magnesium for cramps Memory score is stable, exercise memory with crossword puzzles strategy games etc. Use walker for safe ambulation Follow-up in one year 50% of time spent face to face discussing POC and treatment. Dennie Bible, [facility], [facility], APRN  [facility] [address] [place], [place] [address] [phone]

## 2014-12-20 NOTE — Telephone Encounter (Signed)
Approved: #60 x 0 

## 2015-01-04 ENCOUNTER — Ambulatory Visit: Payer: Self-pay | Admitting: Pain Medicine

## 2015-01-04 DIAGNOSIS — G35 Multiple sclerosis: Secondary | ICD-10-CM | POA: Diagnosis not present

## 2015-01-04 DIAGNOSIS — R51 Headache: Secondary | ICD-10-CM | POA: Diagnosis not present

## 2015-01-04 DIAGNOSIS — M546 Pain in thoracic spine: Secondary | ICD-10-CM | POA: Diagnosis not present

## 2015-01-04 DIAGNOSIS — M533 Sacrococcygeal disorders, not elsewhere classified: Secondary | ICD-10-CM | POA: Diagnosis not present

## 2015-01-04 DIAGNOSIS — M47817 Spondylosis without myelopathy or radiculopathy, lumbosacral region: Secondary | ICD-10-CM | POA: Diagnosis not present

## 2015-01-04 DIAGNOSIS — M47896 Other spondylosis, lumbar region: Secondary | ICD-10-CM | POA: Diagnosis not present

## 2015-01-04 DIAGNOSIS — M542 Cervicalgia: Secondary | ICD-10-CM | POA: Diagnosis not present

## 2015-01-04 DIAGNOSIS — E1340 Other specified diabetes mellitus with diabetic neuropathy, unspecified: Secondary | ICD-10-CM | POA: Diagnosis not present

## 2015-01-04 DIAGNOSIS — E1140 Type 2 diabetes mellitus with diabetic neuropathy, unspecified: Secondary | ICD-10-CM | POA: Diagnosis not present

## 2015-01-07 DIAGNOSIS — J449 Chronic obstructive pulmonary disease, unspecified: Secondary | ICD-10-CM | POA: Diagnosis not present

## 2015-01-10 DIAGNOSIS — I83013 Varicose veins of right lower extremity with ulcer of ankle: Secondary | ICD-10-CM | POA: Diagnosis not present

## 2015-01-10 DIAGNOSIS — M14671 Charcot's joint, right ankle and foot: Secondary | ICD-10-CM | POA: Diagnosis not present

## 2015-01-10 DIAGNOSIS — E11610 Type 2 diabetes mellitus with diabetic neuropathic arthropathy: Secondary | ICD-10-CM | POA: Diagnosis not present

## 2015-01-12 ENCOUNTER — Other Ambulatory Visit: Payer: Self-pay

## 2015-01-12 MED ORDER — TIZANIDINE HCL 2 MG PO TABS: ORAL_TABLET | ORAL | Status: DC

## 2015-01-12 MED ORDER — METFORMIN HCL 1000 MG PO TABS: ORAL_TABLET | ORAL | Status: DC

## 2015-01-12 MED ORDER — TIOTROPIUM BROMIDE MONOHYDRATE 18 MCG IN CAPS: ORAL_CAPSULE | RESPIRATORY_TRACT | Status: AC

## 2015-01-12 MED ORDER — FUROSEMIDE 80 MG PO TABS: 80.0000 mg | ORAL_TABLET | Freq: Every day | ORAL | Status: DC

## 2015-01-12 MED ORDER — FLUOXETINE HCL 20 MG PO CAPS: ORAL_CAPSULE | ORAL | Status: AC

## 2015-01-12 MED ORDER — OMEPRAZOLE 20 MG PO CPDR: DELAYED_RELEASE_CAPSULE | ORAL | Status: DC

## 2015-01-15 ENCOUNTER — Other Ambulatory Visit: Payer: Self-pay

## 2015-01-15 ENCOUNTER — Telehealth: Payer: Self-pay | Admitting: *Deleted

## 2015-01-15 MED ORDER — GLUCOSE BLOOD VI STRP: ORAL_STRIP | Status: DC

## 2015-01-15 MED ORDER — INSULIN LISPRO 100 UNIT/ML (KWIKPEN): PEN_INJECTOR | SUBCUTANEOUS | Status: DC

## 2015-01-15 NOTE — Telephone Encounter (Signed)
Received a form from [facility] stating that patient's insurance company does not cover [employer], and they prefer Humalog and wants to know if MD will change?   Form from pharmacy is on Dee's desk. Is it okay to change? [employer] Complete Plan, [id number], telephone number [phone].  Spoke to patient and was advised that she uses this on a sliding scale and has enough to last her for at least 2 weeks. Is it okay to change or should this be changed by PCP if he agrees.

## 2015-01-15 NOTE — Telephone Encounter (Signed)
Ok to change from [employer] flexpen to [employer] with same sig. Change made and sent to [employer].  Will route to PCP as fyi.

## 2015-01-16 NOTE — Telephone Encounter (Signed)
Agreed that change is fine

## 2015-01-17 ENCOUNTER — Ambulatory Visit
Admit: 2015-01-17 | Disposition: A | Discharge status: Home / Self Care | Payer: Self-pay | Attending: Oncology | Admitting: Oncology

## 2015-01-17 DIAGNOSIS — Z452 Encounter for adjustment and management of vascular access device: Secondary | ICD-10-CM | POA: Diagnosis not present

## 2015-01-17 DIAGNOSIS — Z853 Personal history of malignant neoplasm of breast: Secondary | ICD-10-CM | POA: Diagnosis not present

## 2015-01-17 DIAGNOSIS — E11610 Type 2 diabetes mellitus with diabetic neuropathic arthropathy: Secondary | ICD-10-CM | POA: Diagnosis not present

## 2015-01-17 DIAGNOSIS — I83013 Varicose veins of right lower extremity with ulcer of ankle: Secondary | ICD-10-CM | POA: Diagnosis not present

## 2015-01-17 DIAGNOSIS — M14671 Charcot's joint, right ankle and foot: Secondary | ICD-10-CM | POA: Diagnosis not present

## 2015-01-19 ENCOUNTER — Telehealth: Payer: Self-pay | Admitting: Family Medicine

## 2015-01-19 NOTE — Telephone Encounter (Signed)
received request for specific sliding scale dosage. plz call pt to verify what her sliding scale dosage is then add to sig and fax new Rx to [facility]. Rx in kims' box.

## 2015-01-19 NOTE — Telephone Encounter (Signed)
Spoke with patient. Scale as follows: 150-200= 2 units; 201-250= 4 units; 251-300= 6 units and continues to increase by 2 units by every 50mg /dL. Rx sent to pharmacy.

## 2015-01-23 ENCOUNTER — Other Ambulatory Visit: Payer: Self-pay | Admitting: *Deleted

## 2015-01-23 MED ORDER — DIAZEPAM 5 MG PO TABS: 2.5000 mg | ORAL_TABLET | Freq: Two times a day (BID) | ORAL | Status: DC | PRN

## 2015-01-23 NOTE — Telephone Encounter (Signed)
12/20/14 

## 2015-01-23 NOTE — Telephone Encounter (Signed)
Approved: #60 x 0 

## 2015-01-23 NOTE — Telephone Encounter (Signed)
Called in rx to [facility].

## 2015-01-30 ENCOUNTER — Ambulatory Visit: Payer: Self-pay | Admitting: Pain Medicine

## 2015-01-30 ENCOUNTER — Other Ambulatory Visit: Payer: Self-pay | Admitting: *Deleted

## 2015-01-30 DIAGNOSIS — M546 Pain in thoracic spine: Secondary | ICD-10-CM | POA: Diagnosis not present

## 2015-01-30 DIAGNOSIS — M545 Low back pain: Secondary | ICD-10-CM | POA: Diagnosis not present

## 2015-01-30 DIAGNOSIS — M109 Gout, unspecified: Secondary | ICD-10-CM | POA: Diagnosis not present

## 2015-01-30 DIAGNOSIS — M79671 Pain in right foot: Secondary | ICD-10-CM | POA: Diagnosis not present

## 2015-01-30 DIAGNOSIS — M47817 Spondylosis without myelopathy or radiculopathy, lumbosacral region: Secondary | ICD-10-CM | POA: Diagnosis not present

## 2015-01-30 DIAGNOSIS — E119 Type 2 diabetes mellitus without complications: Secondary | ICD-10-CM | POA: Diagnosis not present

## 2015-01-30 DIAGNOSIS — Z853 Personal history of malignant neoplasm of breast: Secondary | ICD-10-CM | POA: Diagnosis not present

## 2015-01-30 DIAGNOSIS — F17200 Nicotine dependence, unspecified, uncomplicated: Secondary | ICD-10-CM | POA: Diagnosis not present

## 2015-01-30 DIAGNOSIS — I10 Essential (primary) hypertension: Secondary | ICD-10-CM | POA: Diagnosis not present

## 2015-01-30 DIAGNOSIS — J449 Chronic obstructive pulmonary disease, unspecified: Secondary | ICD-10-CM | POA: Diagnosis not present

## 2015-01-30 DIAGNOSIS — M25551 Pain in right hip: Secondary | ICD-10-CM | POA: Diagnosis not present

## 2015-01-30 DIAGNOSIS — G35 Multiple sclerosis: Secondary | ICD-10-CM | POA: Diagnosis not present

## 2015-01-30 DIAGNOSIS — M533 Sacrococcygeal disorders, not elsewhere classified: Secondary | ICD-10-CM | POA: Diagnosis not present

## 2015-01-30 DIAGNOSIS — G8929 Other chronic pain: Secondary | ICD-10-CM | POA: Diagnosis not present

## 2015-01-30 DIAGNOSIS — A5216 Charcot's arthropathy (tabetic): Secondary | ICD-10-CM | POA: Diagnosis not present

## 2015-01-30 DIAGNOSIS — M79641 Pain in right hand: Secondary | ICD-10-CM | POA: Diagnosis not present

## 2015-01-30 DIAGNOSIS — E1340 Other specified diabetes mellitus with diabetic neuropathy, unspecified: Secondary | ICD-10-CM | POA: Diagnosis not present

## 2015-01-30 DIAGNOSIS — K219 Gastro-esophageal reflux disease without esophagitis: Secondary | ICD-10-CM | POA: Diagnosis not present

## 2015-02-05 DIAGNOSIS — J449 Chronic obstructive pulmonary disease, unspecified: Secondary | ICD-10-CM | POA: Diagnosis not present

## 2015-02-09 ENCOUNTER — Ambulatory Visit
Admit: 2015-02-09 | Disposition: A | Discharge status: Home / Self Care | Payer: Self-pay | Attending: Oncology | Admitting: Oncology

## 2015-02-12 ENCOUNTER — Emergency Department
Admit: 2015-02-12 | Disposition: A | Discharge status: Home / Self Care | Payer: Self-pay | Admitting: Emergency Medicine

## 2015-02-12 DIAGNOSIS — M14679 Charcot's joint, unspecified ankle and foot: Secondary | ICD-10-CM | POA: Diagnosis not present

## 2015-02-12 DIAGNOSIS — Z79891 Long term (current) use of opiate analgesic: Secondary | ICD-10-CM | POA: Diagnosis not present

## 2015-02-12 DIAGNOSIS — R61 Generalized hyperhidrosis: Secondary | ICD-10-CM | POA: Diagnosis not present

## 2015-02-12 DIAGNOSIS — Z880 Allergy status to penicillin: Secondary | ICD-10-CM | POA: Diagnosis not present

## 2015-02-12 DIAGNOSIS — K8020 Calculus of gallbladder without cholecystitis without obstruction: Secondary | ICD-10-CM | POA: Diagnosis not present

## 2015-02-12 DIAGNOSIS — J441 Chronic obstructive pulmonary disease with (acute) exacerbation: Secondary | ICD-10-CM | POA: Diagnosis not present

## 2015-02-12 DIAGNOSIS — R531 Weakness: Secondary | ICD-10-CM | POA: Diagnosis not present

## 2015-02-12 DIAGNOSIS — R42 Dizziness and giddiness: Secondary | ICD-10-CM | POA: Diagnosis not present

## 2015-02-12 DIAGNOSIS — E119 Type 2 diabetes mellitus without complications: Secondary | ICD-10-CM | POA: Diagnosis not present

## 2015-02-12 DIAGNOSIS — R079 Chest pain, unspecified: Secondary | ICD-10-CM | POA: Diagnosis not present

## 2015-02-12 DIAGNOSIS — R109 Unspecified abdominal pain: Secondary | ICD-10-CM | POA: Diagnosis not present

## 2015-02-12 DIAGNOSIS — Z720 Tobacco use: Secondary | ICD-10-CM | POA: Diagnosis not present

## 2015-02-12 DIAGNOSIS — R0789 Other chest pain: Secondary | ICD-10-CM | POA: Diagnosis not present

## 2015-02-12 LAB — COMPREHENSIVE METABOLIC PANEL
ALT: 17 U/L
AST: 27 U/L
Albumin: 3.8 g/dL
Alkaline Phosphatase: 98 U/L
Anion Gap: 10 (ref 7–16)
BUN: 13 mg/dL
Bilirubin,Total: 0.1 mg/dL — ABNORMAL LOW
CO2: 27 mmol/L
CREATININE: 0.65 mg/dL
Calcium, Total: 9.2 mg/dL
Chloride: 98 mmol/L — ABNORMAL LOW
EGFR (African American): >60
EGFR (Non-African Amer.): >60
Glucose: 182 mg/dL — ABNORMAL HIGH
POTASSIUM: 4 mmol/L
SODIUM: 135 mmol/L
TOTAL PROTEIN: 6.8 g/dL

## 2015-02-12 LAB — CBC WITH DIFFERENTIAL/PLATELET
Basophil #: 0.1 10*3/uL (ref 0.0–0.1)
Basophil %: 1.4 %
Eosinophil #: 0.5 10*3/uL (ref 0.0–0.7)
Eosinophil %: 5.4 %
HCT: 35.9 % (ref 35.0–47.0)
HGB: 11.6 g/dL — ABNORMAL LOW (ref 12.0–16.0)
LYMPHS ABS: 1.9 10*3/uL (ref 1.0–3.6)
Lymphocyte %: 20.7 %
MCH: 25.6 pg — ABNORMAL LOW (ref 26.0–34.0)
MCHC: 32.2 g/dL (ref 32.0–36.0)
MCV: 80 fL (ref 80–100)
Monocyte #: 1.1 x10 3/mm — ABNORMAL HIGH (ref 0.2–0.9)
Monocyte %: 11.8 %
NEUTROS PCT: 60.7 %
Neutrophil #: 5.5 10*3/uL (ref 1.4–6.5)
PLATELETS: 219 10*3/uL (ref 150–440)
RBC: 4.51 10*6/uL (ref 3.80–5.20)
RDW: 16.3 % — AB (ref 11.5–14.5)
WBC: 9 10*3/uL (ref 3.6–11.0)

## 2015-02-12 LAB — TROPONIN I: Troponin-I: <0.03 ng/mL

## 2015-02-13 LAB — LIPASE, BLOOD: Lipase: 54 U/L — ABNORMAL HIGH

## 2015-02-19 ENCOUNTER — Other Ambulatory Visit: Payer: Self-pay | Admitting: *Deleted

## 2015-02-19 DIAGNOSIS — Z452 Encounter for adjustment and management of vascular access device: Secondary | ICD-10-CM | POA: Diagnosis not present

## 2015-02-19 DIAGNOSIS — Z853 Personal history of malignant neoplasm of breast: Secondary | ICD-10-CM | POA: Diagnosis not present

## 2015-02-19 MED ORDER — INSULIN GLARGINE 100 UNIT/ML SOLOSTAR PEN: PEN_INJECTOR | SUBCUTANEOUS | Status: DC

## 2015-02-19 NOTE — Telephone Encounter (Signed)
Received faxed refill request from pharmacy. Refill sent to pharmacy electronically. 

## 2015-02-20 ENCOUNTER — Encounter: Payer: Self-pay | Admitting: Internal Medicine

## 2015-02-26 ENCOUNTER — Other Ambulatory Visit: Payer: Self-pay | Admitting: *Deleted

## 2015-02-26 NOTE — Telephone Encounter (Signed)
Approved: #60 x 0 

## 2015-02-26 NOTE — Telephone Encounter (Signed)
01/23/15 

## 2015-02-27 MED ORDER — DIAZEPAM 5 MG PO TABS: 2.5000 mg | ORAL_TABLET | Freq: Two times a day (BID) | ORAL | Status: DC | PRN

## 2015-02-27 NOTE — Telephone Encounter (Signed)
rx called into pharmacy

## 2015-03-01 ENCOUNTER — Ambulatory Visit
Admit: 2015-03-01 | Disposition: A | Discharge status: Home / Self Care | Payer: Self-pay | Attending: Pain Medicine | Admitting: Pain Medicine

## 2015-03-01 DIAGNOSIS — M545 Low back pain: Secondary | ICD-10-CM | POA: Diagnosis not present

## 2015-03-01 DIAGNOSIS — M47896 Other spondylosis, lumbar region: Secondary | ICD-10-CM | POA: Diagnosis not present

## 2015-03-01 DIAGNOSIS — M533 Sacrococcygeal disorders, not elsewhere classified: Secondary | ICD-10-CM | POA: Diagnosis not present

## 2015-03-01 DIAGNOSIS — M546 Pain in thoracic spine: Secondary | ICD-10-CM | POA: Diagnosis not present

## 2015-03-01 DIAGNOSIS — F1120 Opioid dependence, uncomplicated: Secondary | ICD-10-CM | POA: Diagnosis not present

## 2015-03-01 DIAGNOSIS — Z0389 Encounter for observation for other suspected diseases and conditions ruled out: Secondary | ICD-10-CM | POA: Diagnosis not present

## 2015-03-01 DIAGNOSIS — Z79899 Other long term (current) drug therapy: Secondary | ICD-10-CM | POA: Diagnosis not present

## 2015-03-01 DIAGNOSIS — M47817 Spondylosis without myelopathy or radiculopathy, lumbosacral region: Secondary | ICD-10-CM | POA: Diagnosis not present

## 2015-03-01 DIAGNOSIS — Z5181 Encounter for therapeutic drug level monitoring: Secondary | ICD-10-CM | POA: Diagnosis not present

## 2015-03-01 DIAGNOSIS — R209 Unspecified disturbances of skin sensation: Secondary | ICD-10-CM | POA: Diagnosis not present

## 2015-03-01 DIAGNOSIS — E1340 Other specified diabetes mellitus with diabetic neuropathy, unspecified: Secondary | ICD-10-CM | POA: Diagnosis not present

## 2015-03-01 DIAGNOSIS — E119 Type 2 diabetes mellitus without complications: Secondary | ICD-10-CM | POA: Diagnosis not present

## 2015-03-03 NOTE — Discharge Summary (Signed)
Dates of Admission and Diagnosis:  Date of Admission 26-Jul-2014   Date of Discharge 31-Jul-2014   Admitting Diagnosis Right foot pain   Final Diagnosis 1. 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 Podiatry applied BK cast with walking pedestal on bottom. 2. MSSA bacteremia - will need 2 weeks of IV abx per ID    Chief Complaint/History of Present Illness right foot pain   Allergies:  Penicillin: Rash, Swelling, Other  Trovan: Hives, Itching  Pertinent Past History:  Pertinent Past History 1. Cellulitis with MSSA and gram-negative organisms in August 2015.  2. Diabetes.  3. Multiple sclerosis.  4. Hypertension.  5. CHF.   6. Diabetic neuropathy.  7. COPD on home O2 at 2 liters.  8. History of breast cancer status post mastectomy, chemotherapy, and radiation, she still has a port in place.  9. Hypothyroidism.  10. Depression.  11. Multiple fractures.   Hospital Course:  Hospital Course patient is a 64 year old female with above-mentioned medical problems was admitted for right foot pain. She was started on broad-spectrum IV antibiotics. Podiatry consultation was obtained, recommended no debridement. the recommended IV antibiotics to be continued as an outpatient for at least 2 weeks. Infectious disease consultation was obtained with Dr. Adrian Prows for MSSA bacteremia. He recommended IV Ancef for 2 weeks through [facility].patient was agreeable with discharge planning and home health was set up for IV antibiotics.   Condition on Discharge Good   Code Status:  Code Status Full Code   PHYSICAL EXAM ON DISCHARGE:  Physical Exam:  GEN well nourished   HEENT pink conjunctivae   NECK supple  No masses   RESP normal resp effort  clear BS   CARD regular rate  no murmur   VASCULAR ACCESS -- Purulent drainage   ABD denies tenderness  soft  normal BS   EXTR rt foot in  dressing - charcot foot   SKIN positive rashes   NEURO follows commands   PSYCH alert, A+O to time, place, person   DISCHARGE INSTRUCTIONS HOME MEDS:  Medication Reconciliation: Patient's Home Medications at Discharge:     Medication Instructions  levothroid 175 mcg (0.175 mg) oral tablet  1 tab(s) orally once a day    carvedilol 25 mg oral tablet  1 tab(s) orally 2 times a day    lasix 40 mg oral tablet  1 tab(s) orally once a day   aspirin 325 mg oral tablet  1 tab(s) orally once a day   calcium 600+d  1 tab(s) orally once a day   klor-con 10 oral tablet, extended release  2 tab(s) orally once a day   metformin 1000 mg oral tablet  1 tab(s) orally 2 times a day   spiriva 18 mcg inhalation capsule  1 puff(s) inhaled once a day, As Needed - for Shortness of Breath   symbicort 160 mcg-4.5 mcg/inh inhalation aerosol with adapter  1 puff(s) inhaled once a day   trazodone 100 mg oral tablet  2 tab(s) (200 mg) orally once a day (at bedtime)   lidocaine topical 5% film  APPLY  1 - 2 PATCHES TO PAINFUL AREA OF SKIN FOR 12 HOURS THEN REMOVE FOR 12 HOURS  AND REPEAT  PROCESS IF TOLERATED   valium 5 mg oral tablet  0.5-1 tab(s) orally 2 times a day, As Needed   fish oil 1000 mg oral capsule  2 cap(s) orally once a day   fluoxetine 20 mg  oral capsule  2 cap(s) orally once a day   trazodone 50 mg oral tablet  1 tab(s) orally once a day (in the morning)   prilosec 20 mg oral delayed release capsule  1 cap(s) orally 2 times a day   meloxicam 15 mg oral tablet  1 tab(s) orally once a day, As Needed   allopurinol 100 mg oral tablet  1 tab(s) orally once a day   albuterol 2.5 mg/3 ml (0.083%) inhalation solution  3 milliliter(s) inhaled every 6 hours, As Needed   insulin aspart 100 units/ml subcutaneous solution  1 dose(s) subcutaneous per sliding scale   tizanidine 2 mg oral tablet  1-2 tab(s) orally 3 times a day, As Needed   gabapentin 300 mg oral capsule  1-2 cap(s) orally 2 to 4 times a day    colchicine 0.6 mg oral tablet  1 tab(s) orally once a day   lantus 100 units/ml subcutaneous solution  45 unit(s) subcutaneous 2 times a day   cefazolin  2 gram(s) IV every 8 hours    STOP TAKING THE FOLLOWING MEDICATION(S):    oxycodone  5  mg :   LIMIT  2 - 4 TABS PO DAY IF TOLERATED neurontin  300  mg  : LIMIT   1 - 2 TABS PO BID - QID  IF TOLERATED   Physician's Instructions:  Home Health? Yes   Hooks Therapy  Nurse  Nurse Aid   Home Health Instructions iv antibiotic thru port until 08/13/2014; CBC with diff, CMP weekly first draw 08/07/14. fax to Dr. Adrian Prows [facility] [place] [phone]   Diet Low Sodium   Activity Limitations As tolerated   Return to Work Not Applicable   Time frame for Follow Up Appointment 1-2 weeks  dr Rodman Key troxler   Time frame for Follow Up Appointment 2-4 weeks  dr Shanon Brow fitzgerald   Other Comments dr Viviana Simpler in 7-10 days     Troxler, Florencia Reasons): [facility], [address], [place], [place] [address], [place]   Adrian Prows Patrick(Consultant): [facility], Internal Medicine, [address] [place], [place] [address], [place], [place]): [facility], [address], [place], [place] [address], [place]   Silvio Pate, Richard(Family Physician): [facility], [place], [address], [place], [place] [address], [place] [phone]  TIME SPENT:  Total Time: Greater than 30 minutes   Electronic Signatures: Remer Macho (MD)  (Signed 23-Sep-15 11:52)  Authored: ADMISSION DATE AND DIAGNOSIS, CHIEF COMPLAINT/HPI, Allergies, PERTINENT PAST HISTORY, HOSPITAL COURSE, PHYSICAL EXAM ON DISCHARGE, [place], PATIENT INSTRUCTIONS, Follow Up Physician, TIME SPENT   Last Updated: 23-Sep-15 11:52 by Remer Macho (MD)

## 2015-03-03 NOTE — Consult Note (Signed)
PATIENT NAME:  GUY, CANET MR#:  [id number] DATE OF BIRTH:  1951/04/01  DATE OF CONSULTATION:  07/31/2014  REFERRING PHYSICIAN:   CONSULTING PHYSICIAN:  Stann Mainland. Sampson Goon, MD  REASON FOR CONSULT:  Staph aureus bacteremia.   HISTORY OF PRESENT ILLNESS: This is a pleasant 64 year old female with history of diabetes as well as MS. She was admitted September 16 with right foot pain. Prior to that she had a recent admission in August for MSSA and gram-negative cellulitis of the right foot. She also has a history of COPD, breast cancer, depression, CHF, and hypertension. On admission on September 16 the patient felt like her foot had increasing redness and pain. She had been treated by her outpatient primary care doctor with colchicine and allopurinol with the thought that it might be gout. When she was admitted she had x-ray showing some changes worrisome for osteomyelitis. The patient was admitted, started on IV antibiotics, and seen by podiatry who felt she likely had more of a Charcot joint. Cultures 1 out of 2 are positive for MSSA however. Her foot is now in a cast.   PAST MEDICAL HISTORY:  1. Cellulitis with MSSA and gram-negative organisms in August 2015.  2. Diabetes.  3. Multiple sclerosis.  4. Hypertension.  5. CHF.   6. Diabetic neuropathy.  7. COPD on home O2 at 2 liters.  8. History of breast cancer status post mastectomy, chemotherapy, and radiation, she still has a port in place.  9. Hypothyroidism.  10. Depression.  11. Multiple fractures.   PAST SURGICAL HISTORY: Surgical repair of the left foot, placement of right chest Port-A-Cath, appendectomy, removal of tumor from right arm, tonsillectomy, thyroidectomy, throat surgery, carpal tunnel release, hysterectomy.   SOCIAL HISTORY: The patient is a former smoker, no alcohol or drugs. She lives with her daughter.   FAMILY HISTORY: Positive for diabetes and heart disease.   REVIEW OF SYSTEMS: 11 systems reviewed and  negative except as per HPI.    ALLERGIES: PENICILLIN.   ANTIBIOTICS SINCE ADMISSION: Include initially clindamycin and levofloxacin, currently on Ancef, she also received vancomycin on admission through September 18.   PHYSICAL EXAMINATION:  VITAL SIGNS: Temperature 98.2, pulse 64, blood pressure 117/75, respirations 20, saturation 92% on room air. She has had no fever since admission.  GENERAL: She is overweight, obese, lying in bed, on oxygen, in no acute distress.  EYES: Pupils equal, round, and reactive to light and accommodation. Extraocular movements are intact. Sclerae anicteric.  OROPHARYNX: Clear.  NECK: Supple.  HEART: Regular.  LUNGS: Coarse breath sounds bilaterally. In her right chest wall she has a Port-A-Cath which is accessed and has no evidence of infection.  ABDOMEN: Obese, nontender.  EXTREMITIES: Left leg has 1+ edema. Right leg is wrapped in a cast.     LABORATORY DATA: Blood cultures 1 of 2 positive for MSSA from September 16, followup blood cultures the 20th are no growth to date. White blood count on admission was 12.0, currently it is 7.3, hemoglobin 10.9, platelets 332,000. Sedimentation rate 29 on September 16.  C-reactive protein was 50 on September 16. Echocardiogram was negative for any evidence of endocarditis. Renal function shows a creatinine of 0.87 on September 19. LFTs were normal on admission except for low albumin at 2.8.   IMAGING: MRI of the right foot without contrast shows extensive Charcot joints of the mid foot with fractures and diffuse edema with subluxations. The findings do not suggest diffuse osteomyelitis.   IMPRESSION: A 64 year old female with  history of diabetes, multiple sclerosis, and Charcot deformity of the right foot, admitted with increasing pain and swelling and redness. She has MSSA bacteremia in 1 of 2 bottles. She does have a Port-A-Cath in place but this does not appear infected at this time. Sedimentation rate is 29, but CRP is  50. She currently has a cast on. Her prior cultures from August of her cellulitis in her right foot had also grown MSSA as well as Peptoniphilus and Prevotella and a gram-negative rod. There is no evidence of osteomyelitis on the MRI, but fractures and edema from Charcot deformity.   RECOMMENDATIONS:  1.  The patient will need at least a 2 week course of IV Ancef 2 grams q. 8 to treat the bacteremia. I have some concern since she has a Port-A-Cath in place. She will need close followup to document that her infection clears.  2.  She will need weekly CBC and comprehensive panel.  3.  We will follow CRP as her one on admission was 50.  4.  She will follow with podiatry for her right leg fractures. If she develops new or worsening wounds there we may need to broaden her coverage to anaerobes given the prior cultures.  5.  Thank you for the consult. I will be glad to follow with you.    ____________________________ Stann Mainland. Sampson Goon, MD dpf:bu D: 07/31/2014 15:58:52 ET T: 07/31/2014 16:23:39 ET JOB#: [id number]  cc: Stann Mainland. Sampson Goon, MD, <Dictator> Nakiesha Rumsey Sampson Goon MD ELECTRONICALLY SIGNED 08/13/2014 23:53

## 2015-03-03 NOTE — H&P (Signed)
PATIENT NAME:  Madeline [name], Madeline [name] MR#:  [id number] DATE OF BIRTH:  03-17-1951  DATE OF ADMISSION:  07/26/2014  REFERRING EMERGENCY ROOM PHYSICIAN:  Letitia Neri, PA.   CHIEF COMPLAINT: Right foot pain.   HISTORY OF PRESENT ILLNESS: This very pleasant 64 year old female with past medical history of recent admission for MSSA and gram-negative cellulitis of the right foot in August of 2015, COPD on 2 liters at home, breast cancer in remission, gout, diabetes, multiple sclerosis, depression, CHF, and hypertension, presents with worsening pain in the right foot and x-ray concerning for osteomyelitis. The patient reports that after her last discharge on August 23 she did feel better and at the time of discharge she had very little pain in the right foot. Unfortunately, in the following days she developed increasing pain and redness in the foot. She has been working with her primary care physician to treat the pain as gout, she is currently on allopurinol and colchicine without relief. She describes pain so severe that she lays in bed and weeps, she is unable to put weight on the foot, she does take Percocet and Neurontin at home and has had no relief. On presentation to the Emergency Room she has had an x-ray of the right foot which shows significant change from limited views available on August 20. Findings worrisome for osteomyelitis involving tarsometatarsal joints. Gout is felt to be less likely. The patient may also have sustained fractures of the tarsometatarsal joint due to neuropathic processes. She is admitted for further evaluation and treatment.   PAST MEDICAL HISTORY:  1. Right foot MSSA and gram negative cellulitis August 2015.  2. Hypertension.  3. Diabetes.  4. Multiple sclerosis.  5. Congestive heart failure.  6. Diabetes neuropathy.  7. COPD with chronic respiratory failure requiring 2 liters in home oxygen.  8. History of left breast cancer status post mastectomy, chemotherapy, and  radiation.  9. Hypothyroidism.  10. Depression.  11. History of multiple fractures   PAST SURGICAL HISTORY:  1. Surgical repair of the left foot.  2. Placement of right chest Port-A-Cath.  3. Appendectomy.  4. Removal of tumor from the right arm.  5. Tonsillectomy.  6. Thyroidectomy.  7. Throat surgery.  8. Carpal tunnel release surgery.  9. Hysterectomy.   FAMILY HISTORY:  Positive for diabetes and heart disease.   SOCIAL HISTORY: The patient is a former smoker, no current tobacco use. No alcohol or illicit substance abuse. She lives at home with her daughter. She is not currently working.   REVIEW OF SYSTEMS:  GENERAL: Positive for fatigue, chills, sweats, no change in weight.  HEENT: No change in vision or hearing, no pain in the eyes or ears, no throat pain, no difficulty swallowing, no oral ulcers.  RESPIRATORY: No shortness of breath, cough, hemoptysis, wheezing, orthopnea.  CARDIOVASCULAR: No palpitations, chest pain, syncope, edema.  GASTROINTESTINAL: No nausea, vomiting, diarrhea, abdominal pain, melena, hematochezia.  GENITOURINARY: No dysuria or frequency.  MUSCULOSKELETAL: Positive for pain in the right foot as noted above, otherwise no swollen or tender joints, no change in exercise tolerance.  NEUROLOGIC: No focal numbness or weakness, no seizure, headache, change in vision.  PSYCHIATRIC: The patient reports significant increase in depression with increasing pain.   PHYSICAL EXAMINATION:  VITAL SIGNS: Temperature 98.2, pulse 78, respirations 16, blood pressure 98/55, oxygen saturation 95% on room air.  GENERAL: The patient is uncomfortable, she is laying calm on the exam stretcher, she has significant pain in the right foot.  HEENT: Pupils  are equal, round, and reactive to light, extraocular motion is intact, conjunctivae are clear with no icterus, no injection, oral mucous membranes are pink and moist, there are no oral lesions, no posterior oropharyngeal exudates or  tonsillar hypertrophy.  NECK: Supple, trachea midline, thyroid is surgically absent, no cervical lymphadenopathy. RESPIRATORY: There are scattered crackles and wheezes, good air movement, no respiratory distress.  CARDIOVASCULAR: Distant heart sounds, regular rate and rhythm, no murmurs, rubs, and gallops, trace peripheral edema, peripheral pulses are 1+.  ABDOMEN: Bowel sounds are normal, abdomen is soft, nontender, nondistended, no guarding, no rebound.    EXTREMITIES: There are no tender or swollen joints, range of motion is normal, strength is 5 out of 5 throughout, the right foot is erythematous and the mid foot is very tender to touch or move, there is no involvement of the ankle.  SKIN: There is erythema over the right mid foot circumferentially, there is a healed right great toe ulcer, which is covered currently by a scab, otherwise no other open areas, no drainage.  PSYCHIATRIC: The patient is depressed, tearful.   LABORATORY DATA: Sodium 137, potassium 4.2, chloride 98, bicarbonate 31, BUN 20, creatinine 0.96, glucose 153. Uric acid 4.2. LFTs are normal with the exception of a decreased serum albumin at 2.8. White blood cell count increased to 12.0, hemoglobin 12.1, platelets 344,000, MCV 80.   IMAGING: Right foot x-ray shows significant change in the appearance of the right foot since 06/29/2014. Findings are worrisome for osteomyelitis involving tarsometatarsal joints. Gout is felt less likely. Possible fractures of the tarsometatarsal joints due to neuropathic processes. MRI is recommended.   IMPRESSION:   1. Right foot pain, possible osteomyelitis versus neuropathic fracture. Blood cultures are drawn. There are no open wounds to culture. MRI is ordered for further evaluation. She was started on vancomycin and clindamycin. The patient has multiple drug allergies making antibiotic selection difficult. We may need an ID consultation. Depending on the MRI findings may need to consult  orthopedics or podiatry. Continue with pain control.  2. Chronic obstructive pulmonary disease with chronic respiratory failure requiring 2 liters of home oxygen. No current signs of exacerbation. Continue home regimen.  3. Diabetes mellitus: Continue Lantus and metformin with sliding scale insulin.  4. Gout: Serum uric acid is 4.2 at this time. Continue with allopurinol. Currently holding colchicine.   5. History of breast cancer: Continue anastrozole. She has had a Port-A-Cath in place for several years, she states she does not want to have it removed.  6. Anxiety and depression: Continue with Valium, fluoxetine.  7. Hypothyroidism: Continue with levothyroxine.   TIME SPENT ON THIS ADMISSION: 40 minutes  ____________________________ Earleen Newport. Volanda Napoleon, MD cpw:bu D: 07/26/2014 19:11:40 ET T: 07/26/2014 19:43:52 ET JOB#: [id number]  cc: Barnetta Chapel P. Volanda Napoleon, MD, <Dictator> Aldean Jewett MD ELECTRONICALLY SIGNED 07/27/2014 7:00

## 2015-03-03 NOTE — Consult Note (Signed)
PATIENT NAME:  Madeline [name], Madeline [name] MR#:  [id number] DATE OF BIRTH:  Aug 18, 1951  DATE OF CONSULTATION:  06/30/2014  CONSULTING PHYSICIAN:  Rhona Raider. Sabin Gibeault, DPM  REASON FOR CONSULTATION: The patient has a wound on her right great toe that has been there for quite a while, been treated at the wound care center for this. She has a history of MRSA infection apparently in that area and was placed on doxycycline a couple of days ago, but she came back to the Emergency Room as a result of continued pain and discomfort. She was admitted through the ER for fever of 101, confusion and redness and swelling to the right foot. She is a 64 year old morbidly obese Caucasian female.   PAST MEDICAL HISTORY:  Hypertension, diabetes, multiple sclerosis, congestive heart failure, diabetic neuropathy, COPD, chronic respiratory failure, history of left breast cancer, hypothyroidism, depression, multiple fractures.   SOCIAL HISTORY: The patient was a smoker for many years, but quit a few years ago. Denies alcohol and illicit drug use.  HOME MEDICATIONS:  Albuterol, she uses every 6 hours, allopurinol 100 mg once a day, anastrazole 1 mg daily, aspirin 325 mg daily, calcium daily, Coreg 25 mg b.i.d. doxycycline 100 mg b.i.d., fluoxetine 20 mg 2 capsules once a day, gabapentin 300 mg 1-2 capsules 2-4 times a day, glimepiride 40 mg subcutaneous 3 times a week, insulin sliding scale usage, potassium supplement, Lantus 50 units subcutaneous twice a day, Lasix 40 mg once a day, levothyroxine 170 mcg daily, Phenergan topical daily, meloxicam 15 mg once a day as needed, metformin 1000 mg b.i.d., oxycodone every 8 hours as needed 5 mg, Prilosec 20 mg orally twice a day, Spiriva, Symbicort, tizanidine, trazodone and Valium 5 mg twice a day.   PHYSICAL EXAMINATION: VITAL SIGNS: Currently include temperature 98.9, pulse is 72, respirations 18, blood pressure 104/66, and pulse oximetry is 93.  EXTREMITIES:  Lower extremity exam: Vascular  DP pulses are difficult to palpate due to edema.  NEUROLOGIC: The patient has a history of peripheral neuropathy.  ORTHOPEDICS:  No gross deformities at this point.  The patient has some swelling to the right foot and leg with some redness and cellulitis around the medial arch, medial ankle and lateral ankle and subtalar joint region.  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 This is an area where there is a wound that is about 1.3 cm diameter. Debridement of this wound yields a fairly clean granular base with no tracking, no deeper penetration. No undermining and no redness or cellulitis extending from the lesion back to the rear foot and ankle where the redness and cellulitis is actually occurring at this point.   CLINICAL IMPRESSION: I do not think that the toe ulceration is infected to the point where it is causing the cellulitis around her ankle, rear foot. She does have a history of gout and takes allopurinol daily and she may have run into a situation where uric acid spike may have caused a gout flare. It certainly has to be considered in the differential diagnosis.   TREATMENT PLAN: I would keep her on her antibiotics right now, but consider the possibility of this being a gouty attack.  Serum uric would probably not be helpful now, if she has deposition in the joints of sodium urate crystals. Would consider maybe starting some steroids tomorrow if she is still having as much pain and swelling in the right foot  and leg. May want to consider rheumatology consult. Today,  I will dress the wound.  I debrided it with a 15 blade and dressed the wound to get pressure off of it and would recommend a pressure relief dressing and also use a postoperative shoe anytime the patient gets up, stands or walks. She is being followed by the wound care center.    ____________________________ Rhona Raider. Cj Beecher, DPM mgt:DT D: 06/30/2014  14:31:27 ET T: 06/30/2014 14:55:11 ET JOB#: [id number]  cc: Rhona Raider Ryan Palermo, DPM, <Dictator> Epimenio Sarin MD ELECTRONICALLY SIGNED 07/19/2014 12:56

## 2015-03-03 NOTE — Consult Note (Signed)
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., 12-Dec-2011   Multi-drug Resistant Organism (MDRO): Positive culture for ESBL organsim., 04-Feb-2011   chronic pain:    COPD:    portacath: right chest wall   MRSA greater than 6 months ago: in right arm, 1993   seasonal allergies:    GERD:    poor circulation:    hx of blood clots:    gout:    memory changes:    masectomy:    Breast Cancer/Left:    Depression:    Osteoarthritis:    Indigestion:    Heart Murmer:    Asthma:    Multiple Sclerosis:    Hypertension:    Diabetes Mellitus,Type I (IDD):    Other, see comments: port- a- cath placed 3 weeks ago, 17-Apr-2009   Appendectomy:    Tumor removed from right arm:    Tonsillectomy and Adenoidectomy:    throat surgery:    Thyroidectomy:    Carpal Tunnel Release/ bilateral:    Hysterectomy - Partial:    Thyroidectomy:   Home Medications: Medication Instructions Status  lidocaine topical 5% film APPLY  1 - 2 PATCHES TO PAINFUL AREA OF SKIN FOR 12 HOURS THEN REMOVE FOR 12 HOURS  AND REPEAT  PROCESS IF TOLERATED Active  NEURONTIN  300  MG   LIMIT   1 - 2 TABS PO BID - QID  IF TOLERATED  Active  OXYCODONE  5  MG    LIMIT  2 - 4 TABS PO DAY IF TOLERATED Active  colchicine 0.6 mg oral tablet 1 tab(s) orally once a day Active  Levothroid 175 mcg (0.175 mg) oral tablet 1 tab(s) orally once a day  Active  carvedilol 25 mg oral tablet 1 tab(s) orally 2 times a day  Active  Lasix 40 mg oral tablet 1 tab(s) orally once a day Active  aspirin 325 mg oral tablet 1 tab(s) orally once a day Active  Calcium 600+D 1 tab(s) orally once a day Active  Klor-Con 10 oral tablet, extended release 2 tab(s) orally once a day Active  metformin 1000 mg oral  tablet 1 tab(s) orally 2 times a day Active  Spiriva 18 mcg inhalation capsule 1 puff(s) inhaled once a day, As Needed - for Shortness of Breath Active  Symbicort 160 mcg-4.5 mcg/inh inhalation aerosol with adapter 1 puff(s) inhaled once a day Active  trazodone 100 mg oral tablet 2 tab(s) (200 mg) orally once a day (at bedtime) Active  Valium 5 mg oral tablet 0.5-1 tab(s) orally 2 times a day, As Needed Active  Fish Oil 1000 mg oral capsule 2 cap(s) orally once a day Active  fluoxetine 20 mg oral capsule 2 cap(s) orally once a day Active  traZODone 50 mg oral tablet 1 tab(s) orally once a day (in the morning) Active  Lantus 100 units/mL subcutaneous solution 52 unit(s) subcutaneous 2 times a day Active  Prilosec 20 mg oral delayed release capsule 1 cap(s) orally 2 times a day Active  meloxicam 15 mg oral tablet 1 tab(s) orally once a day, As Needed Active  allopurinol 100 mg oral tablet 1 tab(s) orally once a day Active  albuterol 2.5 mg/3 mL (0.083%) inhalation solution 3 milliliter(s) inhaled every 6 hours, As Needed Active  anastrozole 1 mg oral  tablet 1 tab(s) orally once a day Active  insulin aspart 100 units/mL subcutaneous solution 1 dose(s) subcutaneous per sliding scale Active  tiZANidine 2 mg oral tablet 1-2 tab(s) orally 3 times a day, As Needed Active  gabapentin 300 mg oral capsule 1-2 cap(s) orally 2 to 4 times a day Active   Lab Results: Routine Micro:  16-Sep-15 15:49   Micro Text Report BLOOD CULTURE   COMMENT                   NO GROWTH IN 8-12 HOURS   ANTIBIOTIC                         16:00   Micro Text Report BLOOD CULTURE   COMMENT                   NO GROWTH IN 8-12 HOURS   ANTIBIOTIC                       General Ref:  16-Sep-15 14:02   C-Reactive Protein ========== TEST NAME ==========  ========= RESULTS =========  = REFERENCE RANGE =  C-REACTIVE PROTEIN,QUANT  C-Reactive Protein, Quant C-Reactive Protein, Quant       [H  50.0 mg/L            ]            0.0-4.9               [facility]            No: [id number]           [address], [place], [place] [address]           Lindon Romp, MD         [phone]   Result(s) reported on 27 Jul 2014 at 08:19AM.  Routine Hem:  16-Sep-15 14:02   WBC (CBC)  12.0  17-Sep-15 04:57   WBC (CBC) 9.9   Radiology Results:  Radiology Results: XRay:    20-Aug-15 08:49, Toe Great (1st Digit) Rt Foot  Toe Great (1st Digit) Rt Foot  REASON FOR EXAM:    ulcer over plantar surface with redness  COMMENTS:       PROCEDURE: DXR - DXR TOE GREAT (1ST DIGIT) RT FOO  - Jun 29 2014  8:49AM     CLINICAL DATA:  Ulcer on the plantar surface of the right great toe.    EXAM:  RIGHT GREAT TOE    COMPARISON:  None.    FINDINGS:  No bony destructive change is identified. There is no fracture or  dislocation. No soft tissue gas collection or radiopaque foreign  body is seen. Soft tissues appear swollen.     IMPRESSION:  Soft tissue swelling.  Otherwise negative.      Electronically Signed    By: Inge Rise M.D.    On: 06/29/2014 08:59         Verified By: Ramond Dial, M.D.,    16-Sep-15 14:00, Foot Right Complete  Foot Right Complete  REASON FOR EXAM:    pain  no know injury   flex 53  COMMENTS:       PROCEDURE: DXR - DXR FOOT RT COMPLETE W/OBLIQUES  - Jul 26 2014  2:00PM     CLINICAL DATA:  Extreme foot pain without known injury.    EXAM:  RIGHT FOOT COMPLETE - 3+ VIEW  COMPARISON:  Great toe series of June 29, 2014.    FINDINGS:  Since the previous study the bones of the foot demonstrate a diffuse  permeative pattern of demineralization. The phalanges are grossly  intact as are the metatarsals. There is deformity of the first  tarsometatarsal joint that is new. Detail of the other  tarsometatarsal joints is limited but abnormalities are present. The  talus and calcaneus and distal tibia and fibula are grossly intact.  There is a large plantar  calcaneal spur. There isdiffuse soft  tissue swelling The tarsometatarsal joints are not well  demonstrated.     IMPRESSION:  The appearance of the right foot has changed markedly from the  limited views visible on this study of June 29, 2014. The findings  are worrisome for osteomyelitis involving the tarsometatarsal  joints. Gout is felt less likely. The patient may have sustained  fractures of the tarsometatarsal joints due to neuropathic  processes.  Follow-up MRI is recommended.    These results were called by telephone at the time of interpretation  on 07/26/2014 at 2:42 pm to Dr. Letitia Neri , who verbally  acknowledged these results.      Electronically Signed    By: David  Martinique    On: 07/26/2014 14:42         Verified By: DAVID A. Martinique, M.D., MD  MRI:    17-Sep-15 12:20, MRI Foot Right Without Contrast  MRI Foot Right Without Contrast  REASON FOR EXAM:    possible osteomyelitis, pain, erythema, xray findings  COMMENTS:       PROCEDURE: MR  - MR FOOT RIGHT WO CONTRAST  - Jul 27 2014 12:20PM     CLINICAL DATA:  Right foot pain and erythema.    EXAM:  MRI OF THE RIGHT FOREFOOT WITHOUTCONTRAST    TECHNIQUE:  Multiplanar, multisequence MR imaging was performed. No intravenous  contrast was administered.    COMPARISON:  None.  FINDINGS:  The patient has developed collapse of the bones of the midfoot  including the navicular, cuboid,and cuneiforms as well as the bases  of the first and second metatarsals. There are hairline fractures of  the distal calcaneus and fractures involve the navicular and  cuneiforms and bases of the first and second metatarsals.    There is edema in the subcutaneous fat of the dorsal lateral aspect  of the forefoot. The distal metatarsals are intact although there is  edema extending into the second metatarsal head. The visualized  portions of the phalanges appear normal.    The distal tibia and fibula and the talus are  normal.    The tendons around the ankle are normal. No inflammation of the  plantar fascia. Chronic thickening of the proximal medial band.     IMPRESSION:  The patient has developed extensive Charcot joints of the midfoot  withfractures and diffuse edema with subluxations. The finding is  not suggestive of diffuse osteomyelitis. Does the patient have  peripheral neuropathy?      Electronically Signed    By: Rozetta Nunnery M.D.    On: 07/27/2014 12:36         Verified By: Larey Seat, M.D.,    Penicillin: Rash, Swelling, Other  Trovan: Hives, Itching  Nursing Flowsheets: **Vital Signs.:   17-Sep-15 13:16  Temperature Temperature (F) 97.6  Celsius 36.4    General Aspect Patient presents with continued pain into her right foot.  She states she had pain that began and late July  in her left foot and then progress to her right foot.  Was apparently admitted to the hospital and was diagnosed with gout in her right foot.  Was discharged seem to get somewhat better but the pain has reoccurred and she has been unable to bear much weight on her right foot recently.  Seen in the ER and x-rays were concerning for possible osteomyelitis.  An MRI has been performed.  She denies any acute fever or chills but at her previous admission she had fever and chills.   Case History and Physical Exam:  Cardiovascular Palpable DP and PT pulses to both feet.  Capillary fill time is brisk.   Musculoskeletal She has diffuse edema to her right foot.  She has noted marked pain with attempted midfoot range of motion.  No ankle pain or MTPJ pain.   Neurological She is mildly neuropathic to the lesser toes with gross sensation to the mid tarsal joint regions.   Skin She has mild diffuse erythema to the midfoot on the right foot.    Impression Acute Charcot a right foot.   Plan I discussed the need to completely immobilize this right foot today.  I will return and place her in a below-the-knee  posterior splint with a compression Ace wrap to the area.  We will consult physical therapy to assist with nonweightbearing to this right foot.  Elevation will be helpful.  She needs to have close follow-up in the outpatient clinic for continued immobilization as I discussed the need to likely cast her for several weeks to stop the acute Charcot changes the can occur.  I will review her x-rays MRI and previous x-rays.   Electronic Signatures: Samara Deist (MD)  (Signed 17-Sep-15 15:40)  Authored: Health Issues, Significant Events - History, Home Medications, Labs, Radiology Results, Allergies, Vital Signs, General Aspect/Present Illness, History and Physical Exam, Impression/Plan   Last Updated: 17-Sep-15 15:40 by Samara Deist (MD)

## 2015-03-03 NOTE — Consult Note (Signed)
Brief Consult Note: Diagnosis: diabetic ulcer right great toe.  Cellulitis to right ankle and subtalar joint.   Patient was seen by consultant.   Consult note dictated.   Recommend further assessment or treatment.   Orders entered.   Discussed with Attending MD.   Comments: No tracking or spreading celllulitis from right great toe wound.  Cellulitis seems independant of toe.  Toe ulcer is relatively superficial.  She has a history gout and this should be considered in the differential diagnosis.  Electronic Signatures: Perry Mount (MD)  (Signed 21-Aug-15 14:36)  Authored: Brief Consult Note   Last Updated: 21-Aug-15 14:36 by Perry Mount (MD)

## 2015-03-03 NOTE — H&P (Signed)
PATIENT NAME:  Madeline [name], LAMM MR#:  [id number] DATE OF BIRTH:  12-Jul-1951  DATE OF ADMISSION:  06/29/2014  PRIMARY CARE PROVIDER: Karie Schwalbe, MD.   CHIEF COMPLAINT: Fever, confusion, and right toe redness, swelling.   HISTORY OF PRESENT ILLNESS: A 64 year old, morbidly obese, Caucasian female patient with history of hypertension, diabetes, multiple sclerosis, neuropathy, chronic respiratory failure, presents to the Emergency Room with complaints of worsening pain, swelling, and redness of the right great toe. The patient mentions that she has had a sore which started as a blister about a week prior. The patient is a little drowsy and is trying to talk, but mentions that she is unable to concentrate.   She was started on doxycycline 2 days prior with cultures from the wound growing MRSA and gram-negative rod and with worsening symptoms, returned to the Emergency Room due to fever, confusion, drowsiness and is being admitted to the hospitalist service with failed outpatient therapy.   She also complains of some mild shortness of breath, but states that she is chronically short of breath and on 2 L home oxygen.   ALLERGIES: PENICILLIN AND TROVAN.   PAST MEDICAL HISTORY:  1. Hypertension.  2. Diabetes.  3. Multiple sclerosis.  4. Congestive heart failure.  5. Diabetic neuropathy.  6. COPD. 7. Chronic respiratory failure, on 2 L home oxygen.  8. History of left breast cancer, status post mastectomy, chemotherapy, and radiation.  9. Hypothyroidism.  10. Depression.  11. Multiple fractures.   SOCIAL HISTORY: The patient used to smoke in the past but quit a few years back. No alcohol. No illicit drug use. Lives at home with her daughter.   FAMILY HISTORY: A brother has diabetes, heart disease.   REVIEW OF SYSTEMS:  CONSTITUTIONAL: Complains of fatigue.  EYES: No blurred vision, pain or redness.  EARS, NOSE, AND THROAT: No tinnitus, ear pain, hearing loss.   RESPIRATORY: Has  chronic cough, chronic shortness of breath. CARDIAC: No chest pain, orthopnea, has chronic edema.  GENITOURINARY: No dysuria, hematuria.  SKIN: Has cellulitis of right lower extremity with ulcer.  NEUROLOGIC: No focal numbness/weakness. Has had some confusion.  PSYCHIATRIC: Has depression.   HOME MEDICATIONS:  1. Albuterol nebulizer every 6 hours as needed.  2. Allopurinol 100 mg oral once a day.  3. Anastrozole 1 mg daily.  4. Aspirin 325 mg daily. 5. Calcium vitamin D 1 tablet daily.  6. Coreg 25 mg oral 2 times a day.  7. Doxycycline 100 mg oral 2 times a day.  8. Fish oil 1000 mg 2 capsules total once a day.  9. Fluoxetine 20 mg 2 capsules once a day.  10. Gabapentin 300 mg 1-2 capsules oral 2-4 times a day.  11. Glatiramer 40 mg subcutaneous 3 times a week.  12. Insulin aspart sliding scale insulin.  13. Potassium chloride 20 mEq daily.  14. Lantus 50 units subcutaneous 2 times a day.  15. Lasix 40 mg oral once a day.  16. Levothyroxine 175 mcg daily.  17. Phenergan topical daily.  18. Meloxicam 15 mg oral once a day as needed.  19. Metformin 1000 mg oral 2 times a day.  20. Oxycodone 5 mg oral every 8 hours as needed.  21. Prilosec 20 mg oral 2 times a day.  22.  Spiriva 18 mcg inhaled daily.  23. Symbicort 1 puff inhaled once a day.  24. Tizanidine 2 mg 1-2 tablets oral 3 times a day as needed.  25. Trazodone 100 mg 2 tablets  at bedtime.  26. Valium 5 mg 2 times a day as needed for anxiety.   PHYSICAL EXAMINATION  VITAL SIGNS: Temperature of 99, pulse 83, blood pressure 136/58, saturating 98% on 2 L oxygen.  GENERAL:  A morbidly obese Caucasian female patient sitting up in bed, a little drowsy, but able to have a conversation.  PSYCHIATRIC: Drowsy.  HEENT: Atraumatic, normocephalic. Mucous membranes are moist and pink. External ears and nose normal. Pallor positive. No icterus. Pupils bilaterally equal and reactive to light.  NECK: Supple .  No thyromegaly or palpable  lymph nodes. Trachea midline. No carotid bruits or JVD.  CARDIOVASCULAR: S1, S2, without any murmurs. Peripheral pulses 2+.  RESPIRATORY: Has decreased air entry with some wheezing. Increased work of breathing.  GASTROINTESTINAL: Soft abdomen, nontender. Bowel sounds present. No hepatosplenomegaly palpable.  SKIN: Warm and dry. Has redness along the dorsum of the right foot with a small ulcer, without any discharge on the plantar side of the great toe. No other rash, ulcers found.  NEUROLOGICAL: Worsened to 4/5 in lower extremities. A 5/5 in upper extremities. Sensation was intact all over.  LYMPHATIC: No cervical lymphadenopathy.   LABORATORY STUDIES: Glucose of 185 and BNP of 2155 with BUN 15, creatinine 0.84. Sodium 135, potassium 4.7. AST, ALT, alkaline phosphatase, bilirubin normal. Troponin 0.02. WBC 13.1, platelets of 200,000, hemoglobin 10.1.   Wound cultures from 06/27/2014 show MRSA and gram-negative rod, both of which seem to be pansensitive.   EKG shows sinus rhythm with PACs.   Chest x-ray PA and lateral, shows mild cardiomegaly with nothing acute.   Foot x-ray shows no fractures. No signs of osteomyelitis. Some cellulitis.   ASSESSMENT AND PLAN:  1. Right lower extremity cellulitis with small ulceration. There is no discharge. This does not seem deep and has been present for about a week. Presently, the patient is afebrile white count is elevated, had fever of 100.7 earlier. Failed outpatient therapy with doxycycline. We will put her on vancomycin and ciprofloxacin, to which both the bacteria from her wound should be sensitive. We will also get blood cultures. Further management per response to the antibiotics. She does not have any fluctuance. No osteomyelitis on x-ray. Will not need any surgery.  2. Acute encephalopathy secondary to the cellulitis and infection.  3. Chronic respiratory failure with chronic obstructive pulmonary. The patient has some wheezing. Likely has wheezing  at baseline. We will put her on breathing treatments. No steroids at this time. She is on baseline 2 L oxygen.  4. Hypertension. Continue medications.  5. Deep vein thrombosis prophylaxis with Lovenox.   CODE STATUS: Full code.   TIME SPENT TODAY ON THIS CASE: 42 minutes    ____________________________ Molinda Bailiff. Dorthy Hustead, MD srs:ls D: 06/29/2014 14:04:00 ET T: 06/29/2014 14:58:35 ET JOB#: [id number]  cc: Wardell Heath R. Ameliya Nicotra, MD, <Dictator> Orie Fisherman MD ELECTRONICALLY SIGNED 07/25/2014 16:30

## 2015-03-03 NOTE — Discharge Summary (Signed)
PATIENT NAME:  Madeline [name], Madeline [name] MR#:  [id number] DATE OF BIRTH:  [birth year]  DATE OF ADMISSION:  06/29/2014 DATE OF DISCHARGE:  07/02/2014  DISCHARGE DIAGNOSES:  1.  Methicillin-resistant Staphylococcus aureus and gram-negative rod right foot cellulitis ulcer.  2.  Chronic obstructive pulmonary disease.  3.  Acute encephalopathy.  4.  Gout.  5.  Arthritis.   DISCHARGE MEDICATIONS:  1.  Levothyroxine 1 tablet oral once a day.  2.  Coreg 25 mg oral 2 times a day.  3.  Lasix 40 mg daily.  4.  Aspirin 325 mg oral once a day.  5.  Calcium with vitamin D 1 tablet daily.  6.  Potassium chloride 10 mEq 2 tablets oral once a day.  7.  Metformin 1000 mg oral 2 times a day.  8.  Spiriva 18 mcg inhaled once a day as needed for shortness of breath.  9.  Symbicort 160/4.5 one puff inhaled once a day.  10.  Trazodone 100 mg 2 tablets once a day.  11.  Lidocaine topical film daily.  12.  Valium 5 mg 1 tablet 2 times a day as needed.  13.  Fish oil 1000 mg 2 capsules daily.  14.  Fluoxetine 20 mg 2 capsules oral once a day.  15.  Trazodone 50 mg daily at bedtime.  16.  Lantus 52 units subcutaneous 2 times a day.  17.  Prilosec 20 mg oral 2 times a day.  18.  Meloxicam 15 mg oral once a day as needed.  19.  Allopurinol 100 mg daily.  20.  Albuterol nebulizer every 6 hours as needed for shortness of breath.  21.  Anastrozole 1 mg oral once a day.  22.  Glatiramer 40 mg subcutaneous 3 times a day.  23.  Insulin aspart per sliding scale a.c. and at bedtime.  24.  Oxycodone 5 mg oral every 8 hours as needed for pain.  25.  Tizanidine 2 mg 1-2 tablets oral 3 times a day as needed.  26.  Gabapentin 300 mg 1-2 tablets oral 4 times a day. 27.  Colchicine 0.6 mg oral once a day for 4 days.  28.  Bactrim DS 800/160 mg oral 2 times a day.   DISCHARGE INSTRUCTIONS: Home health physical therapy and nursing for wound care. Home oxygen 2 liters continuous. Low fat, low-carb diet. Activity as tolerated using  the boot provided to the patient. Follow up in 1-2 weeks with primary care physician, Dr. Alphonsus Sias.   CONSULTS: Bradly Chris, MD with podiatry.   IMAGING STUDIES: Include left and right foot x-rays which showed cellulitis of the right great toe area, no osteomyelitis, no fractures, does have significant arthritic changes.   ADMITTING HISTORY AND PHYSICAL AND HOSPITAL COURSE: Please see detailed H and P dictated previously. In brief, a 64 year old morbidly obese Caucasian female patient with chronic respiratory failure, COPD, hypertension, diabetes, presented to the hospital complaining of fever, confusion and worsening redness, pain, swelling of the right foot. The patient was found to have right foot cellulitis with acute encephalopathy, admitted to hospitalist service. The patient did have fever of 100.7 at home, but afebrile in the hospital.   HOSPITAL COURSE:  1.  Right foot great toe cellulitis. The patient had a small ulceration. This started out as a blister on 06/03/2014. Initially, managed conservatively, later started on doxycycline in the Emergency Room. Also, cultures were sent which are growing MRSA and gram-negative rod. The patient did have fever of 100.7, nothing here in  the hospital. She was started on vancomycin and ciprofloxacin IV with which she has improved well, her redness has resolved. She did have debridement done at bedside by podiatry. Today on examination, her wound looks dry without any purulent discharge, good granulation tissue. She we will continue her regular dressing at home for which home health nursing has been set up, use the boot provided. The patient was seen by physical therapy. She was thought to be a candidate to go to skilled nursing facility for further rehab, but she has refused this. I have also discussed with her daughter at bedside on day of discharge and they agree that the patient wants to go home and for this I have set up home health with physical  therapy. The patient will continue on Bactrim DS 1 tablet b.i.d. for 8 more days to finish 10 day course of antibiotics.  2.  Diabetes mellitus, hypertension, COPD have been stable during the hospital stay.  3.  Acute encephalopathy secondary to her infection has resolved.   DISCHARGE EXAMINATION: EXTREMITIES: Prior to discharge, the patient's wound looks improved.  LUNGS: Sound clear.  HEART: S1, S2.   TIME SPENT TODAY ON DISCHARGE: Forty-five minutes.   ____________________________ Molinda Bailiff Pia Jedlicka, MD srs:TT D: 07/02/2014 12:24:33 ET T: 07/02/2014 17:45:07 ET JOB#: [id number]  cc: Wardell Heath R. Elpidio Anis, MD, <Dictator> Karie Schwalbe, MD Orie Fisherman MD ELECTRONICALLY SIGNED 07/25/2014 16:30

## 2015-03-08 DIAGNOSIS — J449 Chronic obstructive pulmonary disease, unspecified: Secondary | ICD-10-CM | POA: Diagnosis not present

## 2015-03-12 ENCOUNTER — Encounter: Payer: Self-pay | Admitting: Pain Medicine

## 2015-03-12 ENCOUNTER — Ambulatory Visit: Payer: Medicare Other | Attending: Pain Medicine | Admitting: Pain Medicine

## 2015-03-12 VITALS — BP 142/53 | HR 62 | Temp 97.6°F | Resp 16 | Ht 63.0 in | Wt 279.0 lb

## 2015-03-12 DIAGNOSIS — M5030 Other cervical disc degeneration, unspecified cervical region: Secondary | ICD-10-CM | POA: Insufficient documentation

## 2015-03-12 DIAGNOSIS — M5137 Other intervertebral disc degeneration, lumbosacral region: Secondary | ICD-10-CM | POA: Diagnosis not present

## 2015-03-12 DIAGNOSIS — M25561 Pain in right knee: Secondary | ICD-10-CM | POA: Diagnosis not present

## 2015-03-12 DIAGNOSIS — E1049 Type 1 diabetes mellitus with other diabetic neurological complication: Secondary | ICD-10-CM

## 2015-03-12 DIAGNOSIS — M4806 Spinal stenosis, lumbar region: Secondary | ICD-10-CM | POA: Diagnosis not present

## 2015-03-12 DIAGNOSIS — M47817 Spondylosis without myelopathy or radiculopathy, lumbosacral region: Secondary | ICD-10-CM | POA: Diagnosis not present

## 2015-03-12 DIAGNOSIS — E1040 Type 1 diabetes mellitus with diabetic neuropathy, unspecified: Secondary | ICD-10-CM | POA: Diagnosis not present

## 2015-03-12 MED ORDER — BUPIVACAINE HCL (PF) 0.25 % IJ SOLN
30.0000 mL | Freq: Once | INTRAMUSCULAR | Status: AC
  Administered 2015-03-12: 30 mL

## 2015-03-12 MED ORDER — ORPHENADRINE CITRATE 30 MG/ML IJ SOLN
30.0000 mg | Freq: Once | INTRAMUSCULAR | Status: AC
  Administered 2015-03-12: 30 mg via INTRAMUSCULAR

## 2015-03-12 MED ORDER — MIDAZOLAM HCL 2 MG/2ML IJ SOLN
5.0000 mg | Freq: Once | INTRAMUSCULAR | Status: AC
  Administered 2015-03-12: 2 mg via INTRAVENOUS

## 2015-03-12 MED ORDER — FENTANYL CITRATE (PF) 100 MCG/2ML IJ SOLN
50.0000 ug | Freq: Once | INTRAMUSCULAR | Status: AC
  Administered 2015-03-12: 50 ug via INTRAVENOUS

## 2015-03-12 MED ORDER — CIPROFLOXACIN IN D5W 400 MG/200ML IV SOLN
400.0000 mg | Freq: Once | INTRAVENOUS | Status: AC
  Administered 2015-03-12: 400 mg via INTRAVENOUS

## 2015-03-12 MED ORDER — ORPHENADRINE CITRATE 30 MG/ML IJ SOLN
60.0000 mg | Freq: Once | INTRAMUSCULAR | Status: AC
  Administered 2015-03-12: 60 mg via INTRAMUSCULAR

## 2015-03-12 NOTE — Procedures (Signed)
PROCEDURE PERFORMED: Lumbar epidural steroid injection   NOTE: The patient is a 64 y.o.-year-old female who returns to [facility] for further evaluation and treatment of pain involving the lumbar and lower extremity region. MRI studies have revealed the patient to be with severe stenosis facet arthropathy, foraminal stenosis. The risks, benefits, and expectations of the procedure have been discussed and explained to the patient who was understanding and in agreement with suggested treatment plan. We will proceed with interventional treatment as discussed and explained to the patient who is willing to proceed with procedure as planned.  DESCRIPTION OF PROCEDURE: Lumbar epidural steroid injection with IV Versed, IV fentanyl conscious sedation, EKG, blood pressure, pulse, and pulse oximetry monitoring. The procedure was performed with the patient in the prone position under fluoroscopic guidance. A local anesthetic skin wheal of 1.5% plain lidocaine was accomplished at proposed entry site. An 18-gauge Tuohy epidural needle was inserted at the L 3 vertebral body level left of the midline via loss-of-resistance technique with negative heme and negative CSF return. A total of 4 mL of Preservative-Free normal saline with 40 mg of Kenalog injected incrementally via epidurally placed needle. Needle removed. The patient tolerated the injection well.   PLAN:   1. Medications: We will continue presently prescribed medications. 2. Will consider modification of treatment regimen pending response to treatment rendered on today's visit and follow-up evaluation. 3. The patient is to follow-up with primary care physician regarding blood pressure and general medical condition status post lumbar epidural steroid injection performed on today's visit. 4. Surgical evaluation. 5. Neurological evaluation. 6. The patient may be a candidate for radiofrequency procedures, implantation device, and other treatment  pending response to treatment and follow-up evaluation. 7. The patient has been advised to adhere to proper body mechanics and avoid activities which appear to aggravate condition. 8. The patient has been advised to call the [facility] prior to scheduled return appointment should there be significant change in condition or should there be significant  1. Medications: We will continue presently prescribed medications.  2. Will consider modification of treatment regimen pending response to treatment rendered on today's visit and follow-up evaluation.  3. The patient is to follow-up with primary care physician regarding blood pressure and general medical condition status post lumbar epidural steroid injection performed on today's visit.  4. Surgical evaluation.  5. Neurological evaluation. 6. The patient may be a candidate for radiofrequency procedures, implantation device, and other treatment pending response to treatment and follow-up evaluation.  7. The patient has been advised to adhere to proper body mechanics and avoid activities which appear to aggravate condition.  8. The patient has been advised to call the [facility] prior to scheduled return appointment should there be significant change in condition or should should patient have other concerns regarding condition prior to scheduled return appointment.  The patient is understanding and in agreement with suggested treatment plan.

## 2015-03-12 NOTE — Progress Notes (Signed)
   Subjective:    Patient ID: Madeline [name], female    DOB: Mar 08, 1951, 64 y.o.   MRN: [id number]  HPI    Review of Systems     Objective:   Physical Exam        Assessment & Plan:

## 2015-03-12 NOTE — Patient Instructions (Signed)
Lumbar Sympathetic Block Patient Information  Description: The lumbar plexus is a group of nerves that are part of the sympathetic nervous system.  These nerves supply organs in the pelvis and legs.  Lumbar sympathetic blocks are utilized for the diagnosis and treatment of painful conditions in these areas.   The lumbar plexus is located on both sides of the aorta at approximately the level of the second lumbar vertebral body.  The block will be performed with you lying on your abdomen with a pillow underneath.  Using direct x-ray guidance,   The plexus will be located on both sides of the spine.  Numbing medicine will be used to deaden the skin prior to needle insertion.  In most cases, a small amount of sedation can be give by IV prior to the numbing medicine.  One or two small needles will be placed near the plexus and local anesthetic will be injected.  This may make your leg(s) feel warm.  The Entire block usually lasts about 15-25 minutes.  Conditions which may be treated by lumbar sympathetic block:   Reflex sympathetic dystrophy  Phantom limb pain  Peripheral neuropathy  Peripheral vascular disease ( inadequate blood flow )  Cancer pain of pelvis, leg and kidney  Preparation for the injection:  1. Do note eat any solid food or diary products within 6 hours of your appointment. 2. You may drink clear liquids up to 2 hours before appointment.  Clear liquids include water, black coffee, juice or soda.  No milk or cream please. 3. You may take your regular medication, including pain medications, with a sip of water before you appointment.  Diabetics should hold regular insulin ( if taken separately ) and take 1/2 NPH dose the morning of the procedure .  Carry some sugar containing items with you to your appointment. 4. A driver must accompany you and be prepared to drive you home after your procedure. 5. Bring all your current medication with you. 6. An IV may be inserted and sedation  may be given at the discretion of the physician.  7. A blood pressure cuff, EKG and other monitors will often be applied during the procedure.  Some patients may need to have extra oxygen administered for a short period. 8. You will be asked to provide medical information, including your allergies and medications, prior to the procedure.  We must know immediately if your taking blood thinners (like Coumadin/Warfarin) or if you are allergic to IV iodine contrast (dye).  We must know if you could possibly be pregnant.  Possible side-effects   Bleeding from needle site or deeper  Infection (rare, can require surgery)  Nerve injury (rare)  Numbness & tingling (temporary)  Collapsed lung (rare)  Spinal headache (a headache worse with upright posture)  Light-headedness (temporary)  Pain at injection site (several days)  Decreased blood pressure (temporary)  Weakness in legs (temporary)  Seizure or other drug reaction (rare)  Call if you experience:   Fever/chills associated with headache or increased back/ neck pain  Headache worsened by an upright position  New onset weakness or numbness of an extremity below the injection site  Hives or difficulty breathing ( go to the emergency room)  Inflammation or drainage at the injections site(s)  New symptoms which are concerning to you  In the next 24 hours:  Do not drive  Do not drink alcoholic beverages  Do not make any legal decisions  Do not operate any appliances/machinery that could be dangerous  You may use an ice pack , 15 minutes on, 15 minutes off for the next 24 hours then may switch to heat  ( do not put ice and/or heat pack next to skin)  Teach back three things  Discharge instructions given/ patient verbalizes understanding  Discharge via wheelchair to home at          Hours.  Please note:  If effective, we will often do a series of 2-3 injections spaced 3-6 weeks apart to maximally decrease your pain.   If initial series is effective, you may be a candidate for a more permanent block of the lumbar sympathetic plexus.  If you have any questions please call [phone] [facility]

## 2015-03-13 ENCOUNTER — Telehealth: Payer: Self-pay | Admitting: *Deleted

## 2015-03-13 ENCOUNTER — Ambulatory Visit: Payer: Medicare Other | Admitting: Internal Medicine

## 2015-03-13 NOTE — Procedures (Signed)
PROCEDURE PERFORMED: Lumbar sympathetic block.  HISTORY OF PRESENT ILLNESS: The patient is 64 y.o. -year-old female who returns to [place] for further evaluation and treatment of pain involving the lower extremities. The patient is with history of  diabetic neuropathy and  PVD with pain described as burning  throbbing   and shooting pain.. There is concern regarding the patient's pain being due to diabetic neuropathy The risks, benefits, and expectations of the procedure were discussed and explained to the patient who was understanding and wished to proceed with interventional treatment as planned.   DESCRIPTION OF PROCEDURE: Lumbar sympathetic block with IV Versed, IV fentanyl conscious sedation, EKG, blood pressure, pulse, pulse oximetry monitoring. The procedure was performed with the patient in prone position under fluoroscopic guidance.   PROCEDURE #1: right side lumbar sympathetic block: With the patient in prone position and oblique orientation of 20 degrees, Betadine prep and local anesthetic skin wheal of 1.5% lidocaine plain was prepared at the proposed needle entry site. Under fluoroscopic guidance with 20 degrees oblique orientation, the 22 -gauge needle was inserted at the lateral border of the L2 vertebral body on the right side.   PROCEDURE #2: right side lumbar sympathetic block: With the patient in the prone position and oblique orientation of 20 degrees, Betadine prep and local anesthetic skin wheal of 1.5% lidocaine plain was prepared at the proposed needle entry site. Under fluoroscopic guidance with 20 degrees  oblique orientation, the 22 -gauge needle was inserted at the lateral border of the L3 vertebral body on the right side.   PROCEDURE #3: right side lumbar sympathetic block: With the patient in prone position and oblique orientation of 20 degrees, Betadine prep and local anesthetic skin wheal of 1.5% lidocaine plain was prepared at the proposed needle entry site.  Under fluoroscopic guidance with 20 degrees oblique orientation, the 22 -gauge needle was inserted at the lateral border of the L4 vertebral body on the right side.    Following needle placement at the L2, L3 and L4 vertebral body levels on the right side, needle placement was then verified on lateral view with tip of the needle documented to be in the anterior third of the vertebral body of L2, L3 and L4 respectively. Following negative aspiration of each needle for heme and CSF, L2 vertebral body level needle was injected with 10  mL of 0.25% bupivacaine. L3 vertebral body level needle was injected with 10 mL of 0.25% bupivacaine. L4 vertebral body level was injected with 10 mL of 0.25% bupivacaine. Needles were removed. Please note temperature readings prior to lumbar sympathetic block were noted to be 82  degrees Fahrenheit and following completion of the lumbar sympathetic block temperature readings of the lower extremity were noted to be 93  degrees Fahrenheit. The patient tolerated the procedure well.  PLAN:   1. Medications: We will continue presently prescribed medications at this time. 2. The patient is to follow-up with primary care physician, Dr. Silvio Pate, for further evaluation of blood pressure and general medical condition as discussed. 3. Surgical evaluation as discussed. 4. Neurological evaluation as discussed. 5. The patient may be candidate for radiofrequency procedures, implantation devices, and other treatment pending response to treatment and follow-up evaluation. 6. The patient has been advised to adhere to proper body mechanics. 7. The patient has been advised to call the [facility] Management Center prior to scheduled return appointment should there be significant change in condition or have other concerns regarding condition prior to scheduled return appointment.  The patient was understanding and in agreement with suggested treatment plan.

## 2015-03-13 NOTE — Telephone Encounter (Signed)
Post-procedure phone call, ok.

## 2015-03-13 NOTE — Addendum Note (Signed)
Addended by: Burtis Junes on: 03/13/2015 03:46 PM   Modules accepted: Orders

## 2015-03-14 DIAGNOSIS — I83013 Varicose veins of right lower extremity with ulcer of ankle: Secondary | ICD-10-CM | POA: Diagnosis not present

## 2015-03-14 DIAGNOSIS — M14671 Charcot's joint, right ankle and foot: Secondary | ICD-10-CM | POA: Diagnosis not present

## 2015-03-14 DIAGNOSIS — E11610 Type 2 diabetes mellitus with diabetic neuropathic arthropathy: Secondary | ICD-10-CM | POA: Diagnosis not present

## 2015-03-19 NOTE — Addendum Note (Signed)
Addended by: Morley Kos on: 03/19/2015 05:36 PM   Modules accepted: Level of Service

## 2015-03-22 ENCOUNTER — Other Ambulatory Visit: Payer: Self-pay | Admitting: *Deleted

## 2015-03-22 MED ORDER — ALLOPURINOL 300 MG PO TABS: 300.0000 mg | ORAL_TABLET | Freq: Every day | ORAL | Status: DC

## 2015-04-03 ENCOUNTER — Encounter: Payer: Self-pay | Admitting: Pain Medicine

## 2015-04-03 ENCOUNTER — Ambulatory Visit: Payer: Medicare Other | Attending: Pain Medicine | Admitting: Pain Medicine

## 2015-04-03 ENCOUNTER — Inpatient Hospital Stay: Payer: Medicare Other | Attending: Oncology

## 2015-04-03 VITALS — BP 132/93 | HR 76 | Temp 98.1°F | Resp 20 | Ht 63.0 in | Wt 275.0 lb

## 2015-04-03 DIAGNOSIS — G90529 Complex regional pain syndrome I of unspecified lower limb: Secondary | ICD-10-CM | POA: Diagnosis not present

## 2015-04-03 DIAGNOSIS — E1140 Type 2 diabetes mellitus with diabetic neuropathy, unspecified: Secondary | ICD-10-CM | POA: Insufficient documentation

## 2015-04-03 DIAGNOSIS — M51369 Other intervertebral disc degeneration, lumbar region without mention of lumbar back pain or lower extremity pain: Secondary | ICD-10-CM

## 2015-04-03 DIAGNOSIS — M461 Sacroiliitis, not elsewhere classified: Secondary | ICD-10-CM | POA: Diagnosis not present

## 2015-04-03 DIAGNOSIS — M546 Pain in thoracic spine: Secondary | ICD-10-CM | POA: Diagnosis present

## 2015-04-03 DIAGNOSIS — Z853 Personal history of malignant neoplasm of breast: Secondary | ICD-10-CM | POA: Insufficient documentation

## 2015-04-03 DIAGNOSIS — M533 Sacrococcygeal disorders, not elsewhere classified: Secondary | ICD-10-CM | POA: Diagnosis not present

## 2015-04-03 DIAGNOSIS — M791 Myalgia: Secondary | ICD-10-CM | POA: Diagnosis not present

## 2015-04-03 DIAGNOSIS — M5416 Radiculopathy, lumbar region: Secondary | ICD-10-CM | POA: Diagnosis not present

## 2015-04-03 DIAGNOSIS — M4806 Spinal stenosis, lumbar region: Secondary | ICD-10-CM | POA: Diagnosis not present

## 2015-04-03 DIAGNOSIS — Z452 Encounter for adjustment and management of vascular access device: Secondary | ICD-10-CM | POA: Diagnosis not present

## 2015-04-03 DIAGNOSIS — M5136 Other intervertebral disc degeneration, lumbar region: Secondary | ICD-10-CM | POA: Insufficient documentation

## 2015-04-03 DIAGNOSIS — M48062 Spinal stenosis, lumbar region with neurogenic claudication: Secondary | ICD-10-CM

## 2015-04-03 DIAGNOSIS — M7060 Trochanteric bursitis, unspecified hip: Secondary | ICD-10-CM | POA: Diagnosis not present

## 2015-04-03 DIAGNOSIS — C801 Malignant (primary) neoplasm, unspecified: Secondary | ICD-10-CM

## 2015-04-03 DIAGNOSIS — E1340 Other specified diabetes mellitus with diabetic neuropathy, unspecified: Secondary | ICD-10-CM

## 2015-04-03 DIAGNOSIS — M542 Cervicalgia: Secondary | ICD-10-CM | POA: Diagnosis present

## 2015-04-03 DIAGNOSIS — I739 Peripheral vascular disease, unspecified: Secondary | ICD-10-CM

## 2015-04-03 DIAGNOSIS — M47817 Spondylosis without myelopathy or radiculopathy, lumbosacral region: Secondary | ICD-10-CM | POA: Diagnosis not present

## 2015-04-03 MED ORDER — OXYCODONE HCL 5 MG PO TABS: ORAL_TABLET | ORAL | Status: DC

## 2015-04-03 MED ORDER — SODIUM CHLORIDE 0.9 % IJ SOLN
10.0000 mL | INTRAMUSCULAR | Status: DC | PRN
  Administered 2015-04-03: 10 mL via INTRAVENOUS
  Filled 2015-04-03: qty 10

## 2015-04-03 MED ORDER — BACLOFEN 10 MG PO TABS: ORAL_TABLET | ORAL | Status: DC

## 2015-04-03 MED ORDER — HEPARIN SOD (PORK) LOCK FLUSH 10 UNIT/ML IV SOLN
10.0000 [IU] | Freq: Once | INTRAVENOUS | Status: AC
  Administered 2015-04-03: 10 [IU]

## 2015-04-03 MED ORDER — GABAPENTIN 300 MG PO CAPS: 300.0000 mg | ORAL_CAPSULE | Freq: Four times a day (QID) | ORAL | Status: DC

## 2015-04-03 NOTE — Progress Notes (Signed)
   Subjective:    Patient ID: Madeline [name], female    DOB: August 09, 1951, 64 y.o.   MRN: [id number]  HPI    Review of Systems     Objective:   Physical Exam        Assessment & Plan:

## 2015-04-03 NOTE — Progress Notes (Signed)
Ambulatory with cane  Script given to pt of oxycodone  uds due in July left 1155

## 2015-04-03 NOTE — Progress Notes (Signed)
   Subjective:    Patient ID: Madeline [name], female    DOB: 05-17-1951, 64 y.o.   MRN: [id number]  HPI  Patient is 64 year old female returns to [place] for further evaluation and treatment of pain involving the neck and entire back upper extremity regions with most bothersome pain involving the right lower extremity region. During throbbing aching shooting pain of the right lower extremity there is concern regarding component of complex regional pain syndrome contributing to patient's symptomatology the prior fracture of the lower extremity and is with component of diabetic neuropathy. There is concern regarding diabetic neuropathy and complex regional pain syndrome which may be contributing to be released disabling symptoms of the lower extremity independent with activities of daily living as well as ability to obtain restful sleep. We will proceed with lumbar synthetic block at time return appointment in attempt to decrease severity of patient's symptoms and minimize progression of patient's symptoms. All understanding and agree with suggested treatment plan      Review of Systems     Objective:   Physical Exam    palpation of the splenius capitis muscles reproduce moderate discomfort as well as palpation of the occipitalis muscles. There was tenderness over the cervical facet thoracic facet regions of moderate degree with moderate tenderness of the acromioclavicular and glenohumeral joint region with limited range of motion of the shoulders noted. There was decreased grip strength with Tinel and Phalen's maneuver reproducing mild discomfort. Patient over the thoracic facet thoracic paraspinal musculature region was with moderate discomfort without crepitus of the thoracic region haven't been noted and palpation over the lumbar paraspinal musculature region lumbar facet region was with moderate discomfort right greater than the left. There was decreased straight leg raising on  the right compared to left with increased sensitivity to touch of the right lower extremity without definite allodynia noted. No choreiform lesions of the skin were noted decreased EHL strength significantly decreased on the right. Negative clonus negative Homans. Abdomen protuberant and nontender with no costovertebral angle tenderness noted.      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  Lumbar sympathetic block to be performed at time return appointment pending medical clearance by Dr.letvak   F/U PCP for evaliation of  BP and general medical  condition.  F/U surgical evaluation.  F/U neurological evaluation.  May consider radiofrequency rhizolysis or intraspinal procedures pending response to present treatment and F/U evaluation.  Patient to call [facility] should patient have concerns prior to scheduled return appointment.

## 2015-04-03 NOTE — Patient Instructions (Addendum)
Continue present medications. You'll need to hold joint aspirin for 5 days before the procedure if your primary care physician agrees   Lumbar sympathetic block 04/16/2015 F/U PCP for evaliation of  BP and general medical  condition.  F/U surgical evaluation.  F/U neurological evaluation.  May consider radiofrequency rhizolysis or intraspinal procedures pending response to present treatment and F/U evaluation.  Patient to call [facility] Center should patient have concerns prior to scheduled return appointment. Lumbar Sympathetic Block Patient Information  Description: The lumbar plexus is a group of nerves that are part of the sympathetic nervous system.  These nerves supply organs in the pelvis and legs.  Lumbar sympathetic blocks are utilized for the diagnosis and treatment of painful conditions in these areas.   The lumbar plexus is located on both sides of the aorta at approximately the level of the second lumbar vertebral body.  The block will be performed with you lying on your abdomen with a pillow underneath.  Using direct x-ray guidance,   The plexus will be located on both sides of the spine.  Numbing medicine will be used to deaden the skin prior to needle insertion.  In most cases, a small amount of sedation can be give by IV prior to the numbing medicine.  One or two small needles will be placed near the plexus and local anesthetic will be injected.  This may make your leg(s) feel warm.  The Entire block usually lasts about 15-25 minutes.  Conditions which may be treated by lumbar sympathetic block:   Reflex sympathetic dystrophy  Phantom limb pain  Peripheral neuropathy  Peripheral vascular disease ( inadequate blood flow )  Cancer pain of pelvis, leg and kidney  Preparation for the injection:  1. Do note eat any solid food or diary products within 6 hours of your appointment. 2. You may drink clear liquids up to 2 hours before appointment.  Clear liquids include  water, black coffee, juice or soda.  No milk or cream please. 3. You may take your regular medication, including pain medications, with a sip of water before you appointment.  Diabetics should hold regular insulin ( if taken separately ) and take 1/2 NPH dose the morning of the procedure .  Carry some sugar containing items with you to your appointment. 4. A driver must accompany you and be prepared to drive you home after your procedure. 5. Bring all your current medication with you. 6. An IV may be inserted and sedation may be given at the discretion of the physician.  7. A blood pressure cuff, EKG and other monitors will often be applied during the procedure.  Some patients may need to have extra oxygen administered for a short period. 8. You will be asked to provide medical information, including your allergies and medications, prior to the procedure.  We must know immediately if your taking blood thinners (like Coumadin/Warfarin) or if you are allergic to IV iodine contrast (dye).  We must know if you could possibly be pregnant.  Possible side-effects   Bleeding from needle site or deeper  Infection (rare, can require surgery)  Nerve injury (rare)  Numbness & tingling (temporary)  Collapsed lung (rare)  Spinal headache (a headache worse with upright posture)  Light-headedness (temporary)  Pain at injection site (several days)  Decreased blood pressure (temporary)  Weakness in legs (temporary)  Seizure or other drug reaction (rare)  Call if you experience:   Fever/chills associated with headache or increased back/ neck pain  Headache worsened by an upright position  New onset weakness or numbness of an extremity below the injection site  Hives or difficulty breathing ( go to the emergency room)  Inflammation or drainage at the injections site(s)  New symptoms which are concerning to you  Please note:  If effective, we will often do a series of 2-3 injections  spaced 3-6 weeks apart to maximally decrease your pain.  If initial series is effective, you may be a candidate for a more permanent block of the lumbar sympathetic plexus.  If you have any questions please call [phone] [facility]

## 2015-04-03 NOTE — Progress Notes (Signed)
   Subjective:    Patient ID: Madeline [name], female    DOB: 04-Feb-1951, 64 y.o.   MRN: [id number]  HPI    Review of Systems     Objective:   Physical Exam        Assessment & Plan:

## 2015-04-07 DIAGNOSIS — J449 Chronic obstructive pulmonary disease, unspecified: Secondary | ICD-10-CM | POA: Diagnosis not present

## 2015-04-11 HISTORY — PX: FEMUR FRACTURE SURGERY: SHX633

## 2015-04-13 ENCOUNTER — Other Ambulatory Visit: Payer: Self-pay | Admitting: Pain Medicine

## 2015-04-13 DIAGNOSIS — M47816 Spondylosis without myelopathy or radiculopathy, lumbar region: Secondary | ICD-10-CM | POA: Insufficient documentation

## 2015-04-13 DIAGNOSIS — L97519 Non-pressure chronic ulcer of other part of right foot with unspecified severity: Secondary | ICD-10-CM

## 2015-04-13 DIAGNOSIS — E1340 Other specified diabetes mellitus with diabetic neuropathy, unspecified: Secondary | ICD-10-CM | POA: Insufficient documentation

## 2015-04-13 DIAGNOSIS — M5136 Other intervertebral disc degeneration, lumbar region: Secondary | ICD-10-CM

## 2015-04-13 DIAGNOSIS — L97509 Non-pressure chronic ulcer of other part of unspecified foot with unspecified severity: Secondary | ICD-10-CM

## 2015-04-13 DIAGNOSIS — E11621 Type 2 diabetes mellitus with foot ulcer: Secondary | ICD-10-CM

## 2015-04-16 ENCOUNTER — Encounter: Payer: Self-pay | Admitting: Pain Medicine

## 2015-04-16 ENCOUNTER — Ambulatory Visit: Payer: Medicare Other | Attending: Pain Medicine | Admitting: Pain Medicine

## 2015-04-16 VITALS — BP 133/63 | HR 64 | Temp 97.8°F | Resp 18 | Ht 63.0 in | Wt 271.0 lb

## 2015-04-16 DIAGNOSIS — E11622 Type 2 diabetes mellitus with other skin ulcer: Secondary | ICD-10-CM | POA: Diagnosis not present

## 2015-04-16 DIAGNOSIS — E11621 Type 2 diabetes mellitus with foot ulcer: Secondary | ICD-10-CM

## 2015-04-16 DIAGNOSIS — M5136 Other intervertebral disc degeneration, lumbar region: Secondary | ICD-10-CM

## 2015-04-16 DIAGNOSIS — M47817 Spondylosis without myelopathy or radiculopathy, lumbosacral region: Secondary | ICD-10-CM | POA: Diagnosis not present

## 2015-04-16 DIAGNOSIS — M79605 Pain in left leg: Secondary | ICD-10-CM | POA: Diagnosis present

## 2015-04-16 DIAGNOSIS — E1340 Other specified diabetes mellitus with diabetic neuropathy, unspecified: Secondary | ICD-10-CM

## 2015-04-16 DIAGNOSIS — L98499 Non-pressure chronic ulcer of skin of other sites with unspecified severity: Secondary | ICD-10-CM | POA: Insufficient documentation

## 2015-04-16 DIAGNOSIS — E1169 Type 2 diabetes mellitus with other specified complication: Secondary | ICD-10-CM | POA: Insufficient documentation

## 2015-04-16 DIAGNOSIS — L97509 Non-pressure chronic ulcer of other part of unspecified foot with unspecified severity: Secondary | ICD-10-CM

## 2015-04-16 DIAGNOSIS — M79604 Pain in right leg: Secondary | ICD-10-CM | POA: Diagnosis present

## 2015-04-16 DIAGNOSIS — M47816 Spondylosis without myelopathy or radiculopathy, lumbar region: Secondary | ICD-10-CM

## 2015-04-16 MED ORDER — FENTANYL CITRATE (PF) 100 MCG/2ML IJ SOLN
INTRAMUSCULAR | Status: AC
  Administered 2015-04-16: 50 ug via INTRAVENOUS
  Filled 2015-04-16: qty 2

## 2015-04-16 MED ORDER — ORPHENADRINE CITRATE 30 MG/ML IJ SOLN
INTRAMUSCULAR | Status: AC
Start: 2015-04-16 — End: 2015-04-16
  Administered 2015-04-16: 10:00:00
  Filled 2015-04-16: qty 2

## 2015-04-16 MED ORDER — TRIAMCINOLONE ACETONIDE 40 MG/ML IJ SUSP
INTRAMUSCULAR | Status: AC
  Filled 2015-04-16: qty 1

## 2015-04-16 MED ORDER — BUPIVACAINE HCL (PF) 0.25 % IJ SOLN
INTRAMUSCULAR | Status: AC
  Administered 2015-04-16: 10:00:00
  Filled 2015-04-16: qty 30

## 2015-04-16 MED ORDER — MIDAZOLAM HCL 5 MG/5ML IJ SOLN
INTRAMUSCULAR | Status: AC
  Administered 2015-04-16: 3 mg via INTRAVENOUS
  Filled 2015-04-16: qty 5

## 2015-04-16 NOTE — Progress Notes (Signed)
Safety precautions to be maintained throughout the outpatient stay will include: orient to surroundings, keep bed in low position, maintain call bell within reach at all times, provide assistance with transfer out of bed and ambulation.  

## 2015-04-16 NOTE — Progress Notes (Signed)
   Subjective:    Patient ID: Madeline [name], female    DOB: 08/17/1951, 64 y.o.   MRN: [id number]  HPI    Review of Systems     Objective:   Physical Exam        Assessment & Plan:

## 2015-04-16 NOTE — Progress Notes (Signed)
Subjective:    Patient ID: Madeline [name], female    DOB: February 09, 1951, 64 y.o.   MRN: [id number]  HPI  PROCEDURE PERFORMED: Lumbar sympathetic block.  HISTORY OF PRESENT ILLNESS: The patient is 64 y.o. female who returns to [facility] for further evaluation and treatment of pain involving the lower extremities. The patient is with history of diabetes mellitus with ulceration of the lower extremities felt to be due to diabetic neuropathy and diabetic vasculopathy with pain described as sharp shooting burning throbbing pain . There is concern regarding the patient's pain being due to diabetic neuropathy and diabetic vasculopathy . The risks, benefits, and expectations of the procedure were discussed and explained to the patient who was understanding and wished to proceed with interventional treatment as planned.   DESCRIPTION OF PROCEDURE: Lumbar sympathetic block with IV Versed, IV fentanyl conscious sedation, EKG, blood pressure, pulse, pulse oximetry monitoring. The procedure was performed with the patient in prone position under fluoroscopic guidance.   NEEDLE PLACEMENT AT L2, right side lumbar sympathetic block: With the patient in prone position and oblique orientation of 20 degrees, Betadine prep and local anesthetic skin wheal of 1.5% lidocaine plain was prepared at the proposed needle entry site. Under fluoroscopic guidance with 20 oblique orientation, the 22 -gauge needle was inserted at the lateral border of the L2 vertebral body on the right side.   NEEDLE PLACEMENT AT L3, right side lumbar sympathetic block: With the patient in the prone position and oblique orientation of 20 degrees, Betadine prep and local anesthetic skin wheal of 1.5% lidocaine plain was prepared at the proposed needle entry site. Under fluoroscopic guidance with 20 oblique orientation, the 22 -gauge needle was inserted at the lateral border of the L3 vertebral body on the right side.   NEEDLE PLACEMENT  AT L4, right side lumbar sympathetic block: With the patient in prone position and oblique orientation of 20 degrees, Betadine prep and local anesthetic skin wheal of 1.5% lidocaine plain was prepared at the proposed needle entry site. Under fluoroscopic guidance with 20 oblique orientation, the 22 -gauge needle was inserted at the lateral border of the L4 vertebral body on the right side.    Following needle placement at the L2, L3 and L4 vertebral body levels on the right side, needle placement was then verified on lateral view with tip of the needle documented to be in the anterior third of the vertebral body of L2, L3 and L4 respectively. Following negative aspiration of each needle for heme and CSF, L2 vertebral body level needle was injected with 10 mL of 0.25% bupivacaine. L3 vertebral body level needle was injected with 10 mL of 0.25% bupivacaine. L4 vertebral body level was injected with 10 mL of 0.25% bupivacaine. Needles were removed. Please note temperature readings prior to lumbar sympathetic block were noted to be 88.3 degrees Fahrenheit and following completion of the lumbar sympathetic block temperature readings of the lower extremity were noted to be 89.6 degrees Fahrenheit. The patient tolerated the procedure well.  PLAN:   1. Medications: We will continue presently prescribed medications at this time. 2. The patient is to follow-up with primary care physician for further evaluation of blood pressure and general medical condition as discussed. 3. Surgical evaluation as discussed and wound care clinic follow-up evaluation 4. Neurological evaluation as discussed. 5. The patient may be candidate for radiofrequency procedures, implantation devices, and other treatment pending response to treatment and follow-up evaluation. 6. The patient has been advised to  adhere to proper body mechanics. 7. The patient has been advised to call the [facility] prior to scheduled return  appointment should there be significant change in condition or have other concerns regarding condition prior to scheduled return appointment.   The patient was understanding and in agreement with suggested treatment plan.   Review of Systems     Objective:   Physical Exam        Assessment & Plan:

## 2015-04-16 NOTE — Patient Instructions (Addendum)
Continue present medications.  F/U PCP for evaliation of  BP and general medical  condition.  F/U surgical evaluation.  F/U neurological evaluation.  May consider radiofrequency rhizolysis or intraspinal procedures pending response to present treatment and F/U evaluation.  Patient to call Pain Management Center should patient have concerns prior to scheduled return appointment. Pain Management Discharge Instructions  General Discharge Instructions :  If you need to reach your doctor call: Monday-Friday 8:00 am - 4:00 pm at [phone] or toll free [phone].  After clinic hours [phone] to have operator reach doctor.  Bring all of your medication bottles to all your appointments in the pain clinic.  To cancel or reschedule your appointment with Pain Management please remember to call 24 hours in advance to avoid a fee.  Refer to the educational materials which you have been given on: General Risks, I had my Procedure. Discharge Instructions, Post Sedation.  Post Procedure Instructions:  The drugs you were given will stay in your system until tomorrow, so for the next 24 hours you should not drive, make any legal decisions or drink any alcoholic beverages.  You may eat anything you prefer, but it is better to start with liquids then soups and crackers, and gradually work up to solid foods.  Please notify your doctor immediately if you have any unusual bleeding, trouble breathing or pain that is not related to your normal pain.  Depending on the type of procedure that was done, some parts of your body may feel week and/or numb.  This usually clears up by tonight or the next day.  Walk with the use of an assistive device or accompanied by an adult for the 24 hours.  You may use ice on the affected area for the first 24 hours.  Put ice in a Ziploc bag and cover with a towel and place against area 15 minutes on 15 minutes off.  You may switch to heat after 24 hours. 

## 2015-04-17 ENCOUNTER — Telehealth: Payer: Self-pay | Admitting: *Deleted

## 2015-04-17 NOTE — Telephone Encounter (Signed)
No problems upon post procedure phone call

## 2015-04-18 ENCOUNTER — Other Ambulatory Visit: Payer: Self-pay | Admitting: *Deleted

## 2015-04-18 MED ORDER — LEVOTHYROXINE SODIUM 175 MCG PO TABS: 175.0000 ug | ORAL_TABLET | Freq: Every day | ORAL | Status: DC

## 2015-04-18 MED ORDER — POTASSIUM CHLORIDE CRYS ER 10 MEQ PO TBCR: EXTENDED_RELEASE_TABLET | ORAL | Status: DC

## 2015-04-18 MED ORDER — CARVEDILOL 25 MG PO TABS: 25.0000 mg | ORAL_TABLET | Freq: Two times a day (BID) | ORAL | Status: DC

## 2015-04-26 ENCOUNTER — Encounter: Payer: Self-pay | Admitting: Internal Medicine

## 2015-04-26 ENCOUNTER — Ambulatory Visit (INDEPENDENT_AMBULATORY_CARE_PROVIDER_SITE_OTHER): Payer: Medicare Other | Admitting: Internal Medicine

## 2015-04-26 VITALS — BP 140/80 | HR 84 | Temp 98.0°F | Wt 269.0 lb

## 2015-04-26 DIAGNOSIS — J439 Emphysema, unspecified: Secondary | ICD-10-CM

## 2015-04-26 DIAGNOSIS — E11610 Type 2 diabetes mellitus with diabetic neuropathic arthropathy: Secondary | ICD-10-CM

## 2015-04-26 DIAGNOSIS — R609 Edema, unspecified: Secondary | ICD-10-CM | POA: Diagnosis not present

## 2015-04-26 DIAGNOSIS — E1141 Type 2 diabetes mellitus with diabetic mononeuropathy: Secondary | ICD-10-CM | POA: Diagnosis not present

## 2015-04-26 NOTE — Assessment & Plan Note (Signed)
Not overly bad Nothing to suggest DVT She really needs the 80 of furosemide daily

## 2015-04-26 NOTE — Progress Notes (Signed)
Subjective:    Patient ID: Madeline [name], female    DOB: 10/20/1951, 64 y.o.   MRN: [id number]  HPI Here with daughter due to leg pain and swelling  Has chronic problems with her feet--neuropathy Swelling in legs is chronic Can't get the support hose on--- has short ones but they cut off her circulation  Mostly concerned about the swelling Right more than the left Worried about DVT Swelling seems to be worse over past 2 weeks No open areas on right---small open area on left great toe  Sugars have been good Checks sugars bid and occ tid She does adjust her humalog based on readings  COPD always worse in the hot weather  Has cut back on the furosemide herself Now only taking 40mg  daily  Current Outpatient Prescriptions on File Prior to Visit  Medication Sig Dispense Refill  . albuterol (PROVENTIL) (2.5 MG/3ML) 0.083% nebulizer solution Take 3 mLs (2.5 mg total) by nebulization every 6 (six) hours as needed for wheezing or shortness of breath. 75 mL 11  . allopurinol (ZYLOPRIM) 300 MG tablet Take 1 tablet (300 mg total) by mouth daily. 90 tablet 3  . aspirin 325 MG tablet Take 325 mg by mouth daily.      . baclofen (LIORESAL) 10 MG tablet Limit 1 tablet by mouth twice a day to 3 times a day if tolerated 80 each 0  . budesonide-formoterol (SYMBICORT) 160-4.5 MCG/ACT inhaler Inhale 1 puff into the lungs 2 (two) times daily. 10.2 g 5  . Calcium Carbonate-Vitamin D (CALCIUM-VITAMIN D) 600-200 MG-UNIT CAPS Take by mouth daily.      . carvedilol (COREG) 25 MG tablet Take 1 tablet (25 mg total) by mouth 2 (two) times daily with a meal. 180 tablet 3  . colchicine 0.6 MG tablet Take 1 tablet (0.6 mg total) by mouth 2 (two) times daily as needed. 60 tablet 11  . COPAXONE 40 MG/ML SOSY Inject 65ml (40mg ) subcutaneously three times a week. 36 Syringe 3  . diazepam (VALIUM) 5 MG tablet Take 0.5-1 tablets (2.5-5 mg total) by mouth 2 (two) times daily as needed for anxiety. 60 tablet 0  .  fish oil-omega-3 fatty acids 1000 MG capsule Take 2 g by mouth daily.      Marland Kitchen FLUoxetine (PROZAC) 20 MG capsule TAKE 2 CAPSULES BY MOUTH EVERY DAY 180 capsule 3  . furosemide (LASIX) 80 MG tablet Take 1 tablet (80 mg total) by mouth daily. Take second dose around lunchtime if your legs are swelling 180 tablet 3  . gabapentin (NEURONTIN) 300 MG capsule Take 1 capsule (300 mg total) by mouth 4 (four) times daily. 2 TABS 4 TIMES DAILY 240 capsule 0  . glucose blood (ONE TOUCH TEST STRIPS) test strip Use as instructed to test blood sugar 3 times daily dx: E11.40 300 each 3  . insulin lispro (HUMALOG) 100 UNIT/ML KiwkPen Use according to sliding scale as directed 15 mL 3  . Insulin Pen Needle 30G X 5 MM MISC Use as instructed to inject insulin 2-3 times daily dx 250.60 300 each 6  . levothyroxine (SYNTHROID, LEVOTHROID) 175 MCG tablet Take 1 tablet (175 mcg total) by mouth daily. 90 tablet 3  . meloxicam (MOBIC) 15 MG tablet Take 1 tablet (15 mg total) by mouth daily as needed. (Patient taking differently: Take 15 mg by mouth daily. ) 90 tablet 3  . omeprazole (PRILOSEC) 20 MG capsule TAKE ONE CAPSULE BY MOUTH TWICE DAILY 180 capsule 3  . oxyCODONE (  OXY IR/ROXICODONE) 5 MG immediate release tablet Limit 1 tablet by mouth 2-4 times per day if tolerated 120 tablet 0  . potassium chloride (KLOR-CON M10) 10 MEQ tablet TAKE 2 TABLETS BY MOUTH EVERY DAY 180 tablet 3  . tiotropium (SPIRIVA HANDIHALER) 18 MCG inhalation capsule PLACE 1 CAPSULE (18 MCG TOTAL) INTO INHALER AND INHALE DAILY AS NEEDED. 90 capsule 3  . traZODone (DESYREL) 100 MG tablet Take 2 tabs at bedtime. 225 tablet 3   No current facility-administered medications on file prior to visit.    Allergies  Allergen Reactions  . Penicillins Swelling    tongue and throat swelling tolerates cephalosporins  . Trovan [Alatrofloxacin] Hives  . Rosiglitazone Maleate Swelling    Past Medical History  Diagnosis Date  . Depression   . Goiter,  nodular     multi  . Dyslipidemia   . NIDDM, uncontrolled, with neuropathy   . Hypertension   . Allergy   . GERD (gastroesophageal reflux disease)   . Urinary incontinence   . Peripheral neuropathy   . Multiple sclerosis   . Breast cancer   . Hypothyroidism   . Asthma   . COPD (chronic obstructive pulmonary disease)   . DVT (deep venous thrombosis) 1990's    right leg  . Gout     Past Surgical History  Procedure Laterality Date  . Thyroidectomy  09/2004  . Transthoracic echocardiogram  05/16/2004  . Partial hysterectomy  1975  . Axillary hidradenitis excision  1993    Excision biopsy growth right axilla, benign   . Other surgical history      Thyroid biopsy 10/99  . Carpal tunnel release  03/2008    bilateral  . Mastectomy, radical  02/2009    left modified  . Tonsillectomy    . Appendectomy      Family History  Problem Relation Age of Onset  . Kidney failure Mother   . Heart disease Father 80  . Heart disease Brother   . Heart disease Brother     History   Social History  . Marital Status: Widowed    Spouse Name: N/A  . Number of Children: 4  . 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 Smoking status: Current Every Day Smoker -- 1.00 packs/day for 53 years    Types: Cigarettes    Last Attempt to Quit: 11/06/2013  . Smokeless tobacco: Never Used  . Alcohol Use: No  . Drug Use: No  . Sexual Activity: Not on file   Other Topics Concern  . Not on file   Social History Narrative   Widowed 1999 then 2nd Marriage--2000. Widowed again 2009   Living with daughter now   Has living will   Daughter [name] is health care POA.   Would accept rescitation but no prolonged artificial ventilation   No feeding tube if cognitively unaware   Review of Systems  Has some chest pain and down to left axilla--nothing new for quite some time Breathing is stable---does get SOB  easily Still smoking and not ready to stop     Objective:   Physical Exam  Constitutional: She appears well-developed. No distress.  Neck: Normal range of motion. Neck supple.  Cardiovascular: Normal rate, regular rhythm and normal heart sounds.  Exam reveals no gallop.   No murmur heard. Pulmonary/Chest: Effort normal. No respiratory distress. She has no wheezes.  She has no rales.  Decreased breath sounds but clear  Musculoskeletal:  1+ edema in feet No calf tenderness or inflammation Charcot foot on right  Lymphadenopathy:    She has no cervical adenopathy.  Skin:  Small granulated cut on top of left great toe  Psychiatric: She has a normal mood and affect.          Assessment & Plan:

## 2015-04-26 NOTE — Assessment & Plan Note (Signed)
Seems to have good control Will check A1c 

## 2015-04-26 NOTE — Progress Notes (Signed)
Pre visit review using our clinic review tool, if applicable. No additional management support is needed unless otherwise documented below in the visit note. 

## 2015-04-26 NOTE — Assessment & Plan Note (Signed)
Chronic pain in foot She has supportive shoes Trouble with hose though

## 2015-04-26 NOTE — Assessment & Plan Note (Signed)
More symptoms in hot weather but not exacerbated No changes Urged her to at least cut down on cigarettes

## 2015-04-27 LAB — HEMOGLOBIN A1C: Hgb A1c MFr Bld: 7.1 % — ABNORMAL HIGH (ref 4.6–6.5)

## 2015-05-01 ENCOUNTER — Ambulatory Visit: Payer: Medicare Other | Attending: Pain Medicine | Admitting: Pain Medicine

## 2015-05-01 ENCOUNTER — Encounter: Payer: Self-pay | Admitting: Pain Medicine

## 2015-05-01 ENCOUNTER — Encounter: Payer: Self-pay | Admitting: Internal Medicine

## 2015-05-01 VITALS — BP 117/68 | HR 75 | Temp 98.1°F | Resp 16 | Ht 63.0 in | Wt 269.0 lb

## 2015-05-01 DIAGNOSIS — M19019 Primary osteoarthritis, unspecified shoulder: Secondary | ICD-10-CM | POA: Insufficient documentation

## 2015-05-01 DIAGNOSIS — M5416 Radiculopathy, lumbar region: Secondary | ICD-10-CM | POA: Diagnosis not present

## 2015-05-01 DIAGNOSIS — M461 Sacroiliitis, not elsewhere classified: Secondary | ICD-10-CM | POA: Diagnosis not present

## 2015-05-01 DIAGNOSIS — G35 Multiple sclerosis: Secondary | ICD-10-CM | POA: Diagnosis not present

## 2015-05-01 DIAGNOSIS — M47816 Spondylosis without myelopathy or radiculopathy, lumbar region: Secondary | ICD-10-CM

## 2015-05-01 DIAGNOSIS — L97519 Non-pressure chronic ulcer of other part of right foot with unspecified severity: Secondary | ICD-10-CM

## 2015-05-01 DIAGNOSIS — M5136 Other intervertebral disc degeneration, lumbar region: Secondary | ICD-10-CM | POA: Diagnosis not present

## 2015-05-01 DIAGNOSIS — E11621 Type 2 diabetes mellitus with foot ulcer: Secondary | ICD-10-CM

## 2015-05-01 DIAGNOSIS — M79604 Pain in right leg: Secondary | ICD-10-CM | POA: Diagnosis present

## 2015-05-01 DIAGNOSIS — M47817 Spondylosis without myelopathy or radiculopathy, lumbosacral region: Secondary | ICD-10-CM | POA: Diagnosis not present

## 2015-05-01 DIAGNOSIS — E1140 Type 2 diabetes mellitus with diabetic neuropathy, unspecified: Secondary | ICD-10-CM | POA: Diagnosis not present

## 2015-05-01 DIAGNOSIS — M51369 Other intervertebral disc degeneration, lumbar region without mention of lumbar back pain or lower extremity pain: Secondary | ICD-10-CM

## 2015-05-01 DIAGNOSIS — M791 Myalgia: Secondary | ICD-10-CM | POA: Diagnosis not present

## 2015-05-01 DIAGNOSIS — M4806 Spinal stenosis, lumbar region: Secondary | ICD-10-CM | POA: Insufficient documentation

## 2015-05-01 DIAGNOSIS — M79605 Pain in left leg: Secondary | ICD-10-CM | POA: Diagnosis present

## 2015-05-01 DIAGNOSIS — M545 Low back pain: Secondary | ICD-10-CM | POA: Diagnosis present

## 2015-05-01 MED ORDER — OXYCODONE HCL 5 MG PO TABS: ORAL_TABLET | ORAL | Status: DC

## 2015-05-01 MED ORDER — GABAPENTIN 300 MG PO CAPS: 300.0000 mg | ORAL_CAPSULE | Freq: Four times a day (QID) | ORAL | Status: DC

## 2015-05-01 MED ORDER — BACLOFEN 10 MG PO TABS: ORAL_TABLET | ORAL | Status: DC

## 2015-05-01 NOTE — Progress Notes (Signed)
Safety precautions to be maintained throughout the outpatient stay will include: orient to surroundings, keep bed in low position, maintain call bell within reach at all times, provide assistance with transfer out of bed and ambulation.  0825 Discharged to home ambulatory. Scripts given as ordered. Pre procedure instructions given   Bethann Humble RN

## 2015-05-01 NOTE — Patient Instructions (Addendum)
Continue present medications. Neurontin baclofen and oxycodone  Lumbar sympathetic block Wednesday, 05/09/2015. We will need to have Dr.Letvak hold aspirin for 5 days prior to the procedure  F/U PCP for evaliation of  BP and general medical  condition.  F/U surgical evaluation.  F/U neurological evaluation.  May consider radiofrequency rhizolysis or intraspinal procedures pending response to present treatment and F/U evaluation.  Patient to call [facility] should patient have concerns prior to scheduled return appointment. NeurontinGENERAL RISKS AND COMPLICATIONS  What are the risk, side effects and possible complications? Generally speaking, most procedures are safe.  However, with any procedure there are risks, side effects, and the possibility of complications.  The risks and complications are dependent upon the sites that are lesioned, or the type of nerve block to be performed.  The closer the procedure is to the spine, the more serious the risks are.  Great care is taken when placing the radio frequency needles, block needles or lesioning probes, but sometimes complications can occur. 1. Infection: Any time there is an injection through the skin, there is a risk of infection.  This is why sterile conditions are used for these blocks.  There are four possible types of infection. 1. Localized skin infection. 2. Central Nervous System Infection-This can be in the form of Meningitis, which can be deadly. 3. Epidural Infections-This can be in the form of an epidural abscess, which can cause pressure inside of the spine, causing compression of the spinal cord with subsequent paralysis. This would require an emergency surgery to decompress, and there are no guarantees that the patient would recover from the paralysis. 4. Discitis-This is an infection of the intervertebral discs.  It occurs in about 1% of discography procedures.  It is difficult to treat and it may lead to surgery.       2. Pain: the needles have to go through skin and soft tissues, will cause soreness.       3. Damage to internal structures:  The nerves to be lesioned may be near blood vessels or    other nerves which can be potentially damaged.       4. Bleeding: Bleeding is more common if the patient is taking blood thinners such as  aspirin, Coumadin, Ticiid, Plavix, etc., or if he/she have some genetic predisposition  such as hemophilia. Bleeding into the spinal canal can cause compression of the spinal  cord with subsequent paralysis.  This would require an emergency surgery to  decompress and there are no guarantees that the patient would recover from the  paralysis.       5. Pneumothorax:  Puncturing of a lung is a possibility, every time a needle is introduced in  the area of the chest or upper back.  Pneumothorax refers to free air around the  collapsed lung(s), inside of the thoracic cavity (chest cavity).  Another two possible  complications related to a similar event would include: Hemothorax and Chylothorax.   These are variations of the Pneumothorax, where instead of air around the collapsed  lung(s), you may have blood or chyle, respectively.       6. Spinal headaches: They may occur with any procedures in the area of the spine.       7. Persistent CSF (Cerebro-Spinal Fluid) leakage: This is a rare problem, but may occur  with prolonged intrathecal or epidural catheters either due to the formation of a fistulous  track or a dural tear.  8. Nerve damage: By working so close to the spinal cord, there is always a possibility of  nerve damage, which could be as serious as a permanent spinal cord injury with  paralysis.       9. Death:  Although rare, severe deadly allergic reactions known as "Anaphylactic  reaction" can occur to any of the medications used.      10. Worsening of the symptoms:  We can always make thing worse.  What are the chances of something like this happening? Chances of any of  this occuring are extremely low.  By statistics, you have more of a chance of getting killed in a motor vehicle accident: while driving to the hospital than any of the above occurring .  Nevertheless, you should be aware that they are possibilities.  In general, it is similar to taking a shower.  Everybody knows that you can slip, hit your head and get killed.  Does that mean that you should not shower again?  Nevertheless always keep in mind that statistics do not mean anything if you happen to be on the wrong side of them.  Even if a procedure has a 1 (one) in a 1,000,000 (million) chance of going wrong, it you happen to be that one..Also, keep in mind that by statistics, you have more of a chance of having something go wrong when taking medications.  Who should not have this procedure? If you are on a blood thinning medication (e.g. Coumadin, Plavix, see list of "Blood Thinners"), or if you have an active infection going on, you should not have the procedure.  If you are taking any blood thinners, please inform your physician.  How should I prepare for this procedure?  Do not eat or drink anything at least six hours prior to the procedure.  Bring a driver with you .  It cannot be a taxi.  Come accompanied by an adult that can drive you back, and that is strong enough to help you if your legs get weak or numb from the local anesthetic.  Take all of your medicines the morning of the procedure with just enough water to swallow them.  If you have diabetes, make sure that you are scheduled to have your procedure done first thing in the morning, whenever possible.  If you have diabetes, take only half of your insulin dose and notify our nurse that you have done so as soon as you arrive at the clinic.  If you are diabetic, but only take blood sugar pills (oral hypoglycemic), then do not take them on the morning of your procedure.  You may take them after you have had the procedure.  Do not take  aspirin or any aspirin-containing medications, at least eleven (11) days prior to the procedure.  They may prolong bleeding.  Wear loose fitting clothing that may be easy to take off and that you would not mind if it got stained with Betadine or blood.  Do not wear any jewelry or perfume  Remove any nail coloring.  It will interfere with some of our monitoring equipment.  NOTE: Remember that this is not meant to be interpreted as a complete list of all possible complications.  Unforeseen problems may occur.  BLOOD THINNERS The following drugs contain aspirin or other products, which can cause increased bleeding during surgery and should not be taken for 2 weeks prior to and 1 week after surgery.  If you should need take something for relief of minor  pain, you may take acetaminophen which is found in Tylenol,m Datril, Anacin-3 and Panadol. It is not blood thinner. The products listed below are.  Do not take any of the products listed below in addition to any listed on your instruction sheet.  A.P.C or A.P.C with Codeine Codeine Phosphate Capsules #3 Ibuprofen Ridaura  ABC compound Congesprin Imuran rimadil  Advil Cope Indocin Robaxisal  Alka-Seltzer Effervescent Pain Reliever and Antacid Coricidin or Coricidin-D  Indomethacin Rufen  Alka-Seltzer plus Cold Medicine Cosprin Ketoprofen S-A-C Tablets  Anacin Analgesic Tablets or Capsules Coumadin Korlgesic Salflex  Anacin Extra Strength Analgesic tablets or capsules CP-2 Tablets Lanoril Salicylate  Anaprox Cuprimine Capsules Levenox Salocol  Anexsia-D Dalteparin Magan Salsalate  Anodynos Darvon compound Magnesium Salicylate Sine-off  Ansaid Dasin Capsules Magsal Sodium Salicylate  Anturane Depen Capsules Marnal Soma  APF Arthritis pain formula Dewitt's Pills Measurin Stanback  Argesic Dia-Gesic Meclofenamic Sulfinpyrazone  Arthritis Bayer Timed Release Aspirin Diclofenac Meclomen Sulindac  Arthritis pain formula Anacin Dicumarol Medipren  Supac  Analgesic (Safety coated) Arthralgen Diffunasal Mefanamic Suprofen  Arthritis Strength Bufferin Dihydrocodeine Mepro Compound Suprol  Arthropan liquid Dopirydamole Methcarbomol with Aspirin Synalgos  ASA tablets/Enseals Disalcid Micrainin Tagament  Ascriptin Doan's Midol Talwin  Ascriptin A/D Dolene Mobidin Tanderil  Ascriptin Extra Strength Dolobid Moblgesic Ticlid  Ascriptin with Codeine Doloprin or Doloprin with Codeine Momentum Tolectin  Asperbuf Duoprin Mono-gesic Trendar  Aspergum Duradyne Motrin or Motrin IB Triminicin  Aspirin plain, buffered or enteric coated Durasal Myochrisine Trigesic  Aspirin Suppositories Easprin Nalfon Trillsate  Aspirin with Codeine Ecotrin Regular or Extra Strength Naprosyn Uracel  Atromid-S Efficin Naproxen Ursinus  Auranofin Capsules Elmiron Neocylate Vanquish  Axotal Emagrin Norgesic Verin  Azathioprine Empirin or Empirin with Codeine Normiflo Vitamin E  Azolid Emprazil Nuprin Voltaren  Bayer Aspirin plain, buffered or children's or timed BC Tablets or powders Encaprin Orgaran Warfarin Sodium  Buff-a-Comp Enoxaparin Orudis Zorpin  Buff-a-Comp with Codeine Equegesic Os-Cal-Gesic   Buffaprin Excedrin plain, buffered or Extra Strength Oxalid   Bufferin Arthritis Strength Feldene Oxphenbutazone   Bufferin plain or Extra Strength Feldene Capsules Oxycodone with Aspirin   Bufferin with Codeine Fenoprofen Fenoprofen Pabalate or Pabalate-SF   Buffets II Flogesic Panagesic   Buffinol plain or Extra Strength Florinal or Florinal with Codeine Panwarfarin   Buf-Tabs Flurbiprofen Penicillamine   Butalbital Compound Four-way cold tablets Penicillin   Butazolidin Fragmin Pepto-Bismol   Carbenicillin Geminisyn Percodan   Carna Arthritis Reliever Geopen Persantine   Carprofen Gold's salt Persistin   Chloramphenicol Goody's Phenylbutazone   Chloromycetin Haltrain Piroxlcam   Clmetidine heparin Plaquenil   Cllnoril Hyco-pap Ponstel   Clofibrate Hydroxy  chloroquine Propoxyphen         Before stopping any of these medications, be sure to consult the physician who ordered them.  Some, such as Coumadin (Warfarin) are ordered to prevent or treat serious conditions such as "deep thrombosis", "pumonary embolisms", and other heart problems.  The amount of time that you may need off of the medication may also vary with the medication and the reason for which you were taking it.  If you are taking any of these medications, please make sure you notify your pain physician before you undergo any procedures.         Pain Management Discharge Instructions  General Discharge Instructions :  If you need to reach your doctor call: Monday-Friday 8:00 am - 4:00 pm at [phone] or toll free [phone].  After clinic hours [phone] to have operator reach doctor.  Bring all of  your medication bottles to all your appointments in the pain clinic.  To cancel or reschedule your appointment with Pain Management please remember to call 24 hours in advance to avoid a fee.  Refer to the educational materials which you have been given on: General Risks, I had my Procedure. Discharge Instructions, Post Sedation.  Post Procedure Instructions:  The drugs you were given will stay in your system until tomorrow, so for the next 24 hours you should not drive, make any legal decisions or drink any alcoholic beverages.  You may eat anything you prefer, but it is better to start with liquids then soups and crackers, and gradually work up to solid foods.  Please notify your doctor immediately if you have any unusual bleeding, trouble breathing or pain that is not related to your normal pain.  Depending on the type of procedure that was done, some parts of your body may feel week and/or numb.  This usually clears up by tonight or the next day.  Walk with the use of an assistive device or accompanied by an adult for the 24 hours.  You may use ice on the affected  area for the first 24 hours.  Put ice in a Ziploc bag and cover with a towel and place against area 15 minutes on 15 minutes off.  You may switch to heat after 24 hours.

## 2015-05-01 NOTE — Progress Notes (Signed)
   Subjective:    Patient ID: Madeline [name], female    DOB: [birth year], 64 y.o.   MRN: [id number]  HPI    Patient is 64 year old female who returns to [facility] for further evaluation and treatment of pain involving the lower back and lower extremity region predominantly especially the lower extremities with burning stinging throbbing sensations of the feet. There is concern regarding patient's severe symptoms of the feet being due to diabetic neuropathy. Patient is with history of ulcerations of the feet felt to be due to diabetic neuropathy/vasculopathy and is with severe paresthesias at this time which interfere with patient's ability to perform activities of daily living as well as ability to obtain restful sleep. We will proceed with lumbar synthetic block at time return appointment in attempt to decrease severely disabling symptoms of the feet as discussed and explained to patient on today's visit. The patient is understanding and agreement with suggested treatment plan.  Review of Systems     Objective:   Physical Exam  Mild tenderness to palpation of paraspinal musculature region cervical region cervical facet region with unremarkable Spurling's maneuver. There was tenderness over the cervical facet and thoracic facet and paraspinal musculature region of mild degree. Palpation of the acromioclavicular glenohumeral joint region was increased pain involving the right greater than the left. There appeared to be decreased grip strength and Tinel and Phalen's maneuver were without increase of pain of significant degree. Palpation over the thoracic facet thoracic paraspinal musculature region was a tends to palpation with muscle spasms in the lower thoracic region predominantly. Palpation of the lumbar paraspinal musculature region lumbar facet region was associated increased pain with palpation over the lumbar facets reproducing moderate discomfort. Palpation of the gluteal and  piriformis musketry region was a tends to palpation of moderate degree and there was tends to palpation of the PSIS and PII S regions of moderate degree. Straight leg raising tolerates approximately 20 there was decreased sensation of the lower extremities in a stocking type distribution. There was increased sensitivity to touch of the lower extremities with negative Homans and without increased redness or excessive swelling or evidence of new lesions of the lower extremity. There were areas of hypersensitivity to touch of the lower extremity especially the right lower extremity abdomen was nontender and no costovertebral angle tenderness was noted.      Assessment & Plan:  Degenerative disc disease lumbar spine  Multilevel degenerative changes lumbar spine with L4-5 level most significantly involved  Lumbar facet syndrome  Lumbar stenosis with neurogenic claudication  Diabetic neuropathy/vasculopathy  Multiple sclerosis  DJD shoulder    Plan  Continue present medications. Neurontin,  Baclofen, and oxycodone  Lumbar sympathetic block to be performed at time return appointment in attempt to decrease severe paresthesias of the lower extremity and hopefully retard progression of patient's symptoms of the lower extremities  F/U PCP for evaliation of  BP and general medical  condition.  F/U surgical evaluation.  F/U neurological evaluation.  May consider radiofrequency rhizolysis or intraspinal procedures pending response to present treatment and F/U evaluation.  Patient to call Pain Management Center should patient have concerns prior to scheduled return appointment.

## 2015-05-02 ENCOUNTER — Telehealth: Payer: Self-pay | Admitting: Internal Medicine

## 2015-05-02 ENCOUNTER — Telehealth: Payer: Self-pay | Admitting: *Deleted

## 2015-05-02 DIAGNOSIS — G8190 Hemiplegia, unspecified affecting unspecified side: Secondary | ICD-10-CM | POA: Diagnosis not present

## 2015-05-02 DIAGNOSIS — M79605 Pain in left leg: Secondary | ICD-10-CM | POA: Diagnosis not present

## 2015-05-02 DIAGNOSIS — R1030 Lower abdominal pain, unspecified: Secondary | ICD-10-CM | POA: Diagnosis not present

## 2015-05-02 NOTE — Telephone Encounter (Signed)
Pt is having a nerve block on her leg on Wednesday and is not going to be taking her Asprin from Sat to Wed.  She will start the Asprin back up on Thursday.  Call back [phone] if there are any questions. thanks

## 2015-05-02 NOTE — Telephone Encounter (Signed)
After questioning patient more thoroughly, she reports pain was so bad she had to take a pain pill. She reports the pain is going into her left jaw and down her left arm as well. I advised her that she needs to go to the ER now and that she should not drive herself. She stated that she is alone so I advised she call EMS right away. She was concerned regarding having to pay for EMS and I reiterated to her that it could be her heart with her symptoms and this is something that needs to be addressed now and not later. She said she would call EMS.  I called her daughter [name] who told me she is on her way to get her mother to take her to the ER

## 2015-05-02 NOTE — Telephone Encounter (Signed)
Dr. Silvio Pate already addressed this in an email from pt.

## 2015-05-05 ENCOUNTER — Encounter: Payer: Self-pay | Admitting: *Deleted

## 2015-05-05 ENCOUNTER — Other Ambulatory Visit: Payer: Self-pay

## 2015-05-05 ENCOUNTER — Emergency Department
Admission: EM | Admit: 2015-05-05 | Discharge: 2015-05-05 | Disposition: A | Discharge status: Other Acute Inpatient Hospital | Payer: Medicare Other | Attending: Emergency Medicine | Admitting: Emergency Medicine

## 2015-05-05 ENCOUNTER — Emergency Department: Payer: Medicare Other

## 2015-05-05 DIAGNOSIS — Z7982 Long term (current) use of aspirin: Secondary | ICD-10-CM | POA: Diagnosis not present

## 2015-05-05 DIAGNOSIS — S72402A Unspecified fracture of lower end of left femur, initial encounter for closed fracture: Secondary | ICD-10-CM | POA: Diagnosis not present

## 2015-05-05 DIAGNOSIS — S3993XA Unspecified injury of pelvis, initial encounter: Secondary | ICD-10-CM | POA: Diagnosis not present

## 2015-05-05 DIAGNOSIS — Y998 Other external cause status: Secondary | ICD-10-CM | POA: Diagnosis not present

## 2015-05-05 DIAGNOSIS — W19XXXA Unspecified fall, initial encounter: Secondary | ICD-10-CM

## 2015-05-05 DIAGNOSIS — Z923 Personal history of irradiation: Secondary | ICD-10-CM | POA: Diagnosis not present

## 2015-05-05 DIAGNOSIS — K219 Gastro-esophageal reflux disease without esophagitis: Secondary | ICD-10-CM | POA: Diagnosis not present

## 2015-05-05 DIAGNOSIS — G8918 Other acute postprocedural pain: Secondary | ICD-10-CM | POA: Diagnosis not present

## 2015-05-05 DIAGNOSIS — Z6841 Body Mass Index (BMI) 40.0 and over, adult: Secondary | ICD-10-CM | POA: Diagnosis not present

## 2015-05-05 DIAGNOSIS — S72452A Displaced supracondylar fracture without intracondylar extension of lower end of left femur, initial encounter for closed fracture: Secondary | ICD-10-CM | POA: Diagnosis not present

## 2015-05-05 DIAGNOSIS — M79605 Pain in left leg: Secondary | ICD-10-CM | POA: Diagnosis not present

## 2015-05-05 DIAGNOSIS — Z720 Tobacco use: Secondary | ICD-10-CM | POA: Insufficient documentation

## 2015-05-05 DIAGNOSIS — R52 Pain, unspecified: Secondary | ICD-10-CM

## 2015-05-05 DIAGNOSIS — Y9389 Activity, other specified: Secondary | ICD-10-CM | POA: Insufficient documentation

## 2015-05-05 DIAGNOSIS — G8929 Other chronic pain: Secondary | ICD-10-CM | POA: Diagnosis not present

## 2015-05-05 DIAGNOSIS — Z9012 Acquired absence of left breast and nipple: Secondary | ICD-10-CM | POA: Diagnosis not present

## 2015-05-05 DIAGNOSIS — G4733 Obstructive sleep apnea (adult) (pediatric): Secondary | ICD-10-CM | POA: Diagnosis not present

## 2015-05-05 DIAGNOSIS — F39 Unspecified mood [affective] disorder: Secondary | ICD-10-CM | POA: Diagnosis not present

## 2015-05-05 DIAGNOSIS — M549 Dorsalgia, unspecified: Secondary | ICD-10-CM | POA: Diagnosis not present

## 2015-05-05 DIAGNOSIS — I509 Heart failure, unspecified: Secondary | ICD-10-CM | POA: Diagnosis not present

## 2015-05-05 DIAGNOSIS — I10 Essential (primary) hypertension: Secondary | ICD-10-CM | POA: Diagnosis not present

## 2015-05-05 DIAGNOSIS — Z853 Personal history of malignant neoplasm of breast: Secondary | ICD-10-CM | POA: Diagnosis not present

## 2015-05-05 DIAGNOSIS — Y92002 Bathroom of unspecified non-institutional (private) residence single-family (private) house as the place of occurrence of the external cause: Secondary | ICD-10-CM | POA: Insufficient documentation

## 2015-05-05 DIAGNOSIS — Z9049 Acquired absence of other specified parts of digestive tract: Secondary | ICD-10-CM | POA: Diagnosis not present

## 2015-05-05 DIAGNOSIS — Z86718 Personal history of other venous thrombosis and embolism: Secondary | ICD-10-CM | POA: Diagnosis not present

## 2015-05-05 DIAGNOSIS — L97509 Non-pressure chronic ulcer of other part of unspecified foot with unspecified severity: Secondary | ICD-10-CM | POA: Diagnosis not present

## 2015-05-05 DIAGNOSIS — R531 Weakness: Secondary | ICD-10-CM | POA: Diagnosis not present

## 2015-05-05 DIAGNOSIS — E1142 Type 2 diabetes mellitus with diabetic polyneuropathy: Secondary | ICD-10-CM | POA: Diagnosis not present

## 2015-05-05 DIAGNOSIS — S8991XA Unspecified injury of right lower leg, initial encounter: Secondary | ICD-10-CM | POA: Diagnosis not present

## 2015-05-05 DIAGNOSIS — Z9079 Acquired absence of other genital organ(s): Secondary | ICD-10-CM | POA: Diagnosis not present

## 2015-05-05 DIAGNOSIS — E1140 Type 2 diabetes mellitus with diabetic neuropathy, unspecified: Secondary | ICD-10-CM | POA: Diagnosis not present

## 2015-05-05 DIAGNOSIS — Z23 Encounter for immunization: Secondary | ICD-10-CM | POA: Diagnosis not present

## 2015-05-05 DIAGNOSIS — S72332A Displaced oblique fracture of shaft of left femur, initial encounter for closed fracture: Secondary | ICD-10-CM | POA: Diagnosis not present

## 2015-05-05 DIAGNOSIS — R279 Unspecified lack of coordination: Secondary | ICD-10-CM | POA: Diagnosis not present

## 2015-05-05 DIAGNOSIS — G35 Multiple sclerosis: Secondary | ICD-10-CM | POA: Diagnosis not present

## 2015-05-05 DIAGNOSIS — S299XXA Unspecified injury of thorax, initial encounter: Secondary | ICD-10-CM | POA: Diagnosis not present

## 2015-05-05 DIAGNOSIS — S7292XA Unspecified fracture of left femur, initial encounter for closed fracture: Secondary | ICD-10-CM

## 2015-05-05 DIAGNOSIS — J410 Simple chronic bronchitis: Secondary | ICD-10-CM | POA: Diagnosis not present

## 2015-05-05 DIAGNOSIS — D62 Acute posthemorrhagic anemia: Secondary | ICD-10-CM | POA: Diagnosis not present

## 2015-05-05 DIAGNOSIS — W010XXA Fall on same level from slipping, tripping and stumbling without subsequent striking against object, initial encounter: Secondary | ICD-10-CM | POA: Insufficient documentation

## 2015-05-05 DIAGNOSIS — Z880 Allergy status to penicillin: Secondary | ICD-10-CM | POA: Diagnosis not present

## 2015-05-05 DIAGNOSIS — T148 Other injury of unspecified body region: Secondary | ICD-10-CM | POA: Diagnosis not present

## 2015-05-05 DIAGNOSIS — Z79899 Other long term (current) drug therapy: Secondary | ICD-10-CM | POA: Diagnosis not present

## 2015-05-05 DIAGNOSIS — S80211A Abrasion, right knee, initial encounter: Secondary | ICD-10-CM | POA: Diagnosis not present

## 2015-05-05 DIAGNOSIS — E039 Hypothyroidism, unspecified: Secondary | ICD-10-CM | POA: Diagnosis not present

## 2015-05-05 DIAGNOSIS — S72352A Displaced comminuted fracture of shaft of left femur, initial encounter for closed fracture: Secondary | ICD-10-CM | POA: Diagnosis not present

## 2015-05-05 DIAGNOSIS — E11621 Type 2 diabetes mellitus with foot ulcer: Secondary | ICD-10-CM | POA: Diagnosis not present

## 2015-05-05 DIAGNOSIS — S72332D Displaced oblique fracture of shaft of left femur, subsequent encounter for closed fracture with routine healing: Secondary | ICD-10-CM | POA: Diagnosis not present

## 2015-05-05 DIAGNOSIS — Z9221 Personal history of antineoplastic chemotherapy: Secondary | ICD-10-CM | POA: Diagnosis not present

## 2015-05-05 DIAGNOSIS — Z794 Long term (current) use of insulin: Secondary | ICD-10-CM | POA: Diagnosis not present

## 2015-05-05 DIAGNOSIS — M109 Gout, unspecified: Secondary | ICD-10-CM | POA: Diagnosis not present

## 2015-05-05 DIAGNOSIS — J449 Chronic obstructive pulmonary disease, unspecified: Secondary | ICD-10-CM | POA: Diagnosis not present

## 2015-05-05 DIAGNOSIS — M25551 Pain in right hip: Secondary | ICD-10-CM | POA: Diagnosis not present

## 2015-05-05 DIAGNOSIS — M25552 Pain in left hip: Secondary | ICD-10-CM | POA: Diagnosis not present

## 2015-05-05 DIAGNOSIS — E11610 Type 2 diabetes mellitus with diabetic neuropathic arthropathy: Secondary | ICD-10-CM | POA: Diagnosis not present

## 2015-05-05 DIAGNOSIS — M25561 Pain in right knee: Secondary | ICD-10-CM | POA: Diagnosis not present

## 2015-05-05 DIAGNOSIS — S72462A Displaced supracondylar fracture with intracondylar extension of lower end of left femur, initial encounter for closed fracture: Secondary | ICD-10-CM | POA: Diagnosis not present

## 2015-05-05 DIAGNOSIS — D696 Thrombocytopenia, unspecified: Secondary | ICD-10-CM | POA: Diagnosis not present

## 2015-05-05 DIAGNOSIS — E1149 Type 2 diabetes mellitus with other diabetic neurological complication: Secondary | ICD-10-CM | POA: Diagnosis not present

## 2015-05-05 DIAGNOSIS — S8992XA Unspecified injury of left lower leg, initial encounter: Secondary | ICD-10-CM | POA: Diagnosis present

## 2015-05-05 LAB — CBC WITH DIFFERENTIAL/PLATELET
Basophils Absolute: 0.1 10*3/uL (ref 0–0.1)
Basophils Relative: 1 %
Eosinophils Absolute: 0.6 10*3/uL (ref 0–0.7)
Eosinophils Relative: 6 %
HCT: 34.5 % — ABNORMAL LOW (ref 35.0–47.0)
Hemoglobin: 10.6 g/dL — ABNORMAL LOW (ref 12.0–16.0)
Lymphocytes Relative: 15 %
Lymphs Abs: 1.5 10*3/uL (ref 1.0–3.6)
MCH: 24 pg — ABNORMAL LOW (ref 26.0–34.0)
MCHC: 30.7 g/dL — ABNORMAL LOW (ref 32.0–36.0)
MCV: 78 fL — ABNORMAL LOW (ref 80.0–100.0)
Monocytes Absolute: 1.1 10*3/uL — ABNORMAL HIGH (ref 0.2–0.9)
Monocytes Relative: 11 %
Neutro Abs: 6.9 10*3/uL — ABNORMAL HIGH (ref 1.4–6.5)
Neutrophils Relative %: 67 %
Platelets: 210 10*3/uL (ref 150–440)
RBC: 4.43 MIL/uL (ref 3.80–5.20)
RDW: 17.8 % — ABNORMAL HIGH (ref 11.5–14.5)
WBC: 10.2 10*3/uL (ref 3.6–11.0)

## 2015-05-05 LAB — BASIC METABOLIC PANEL
Anion gap: 9 (ref 5–15)
BUN: 15 mg/dL (ref 6–20)
CO2: 32 mmol/L (ref 22–32)
Calcium: 8.7 mg/dL — ABNORMAL LOW (ref 8.9–10.3)
Chloride: 96 mmol/L — ABNORMAL LOW (ref 101–111)
Creatinine, Ser: 0.71 mg/dL (ref 0.44–1.00)
GFR calc non Af Amer: >60 mL/min (ref >60)
GLUCOSE: 110 mg/dL — AB (ref 65–99)
Potassium: 3.5 mmol/L (ref 3.5–5.1)
Sodium: 137 mmol/L (ref 135–145)

## 2015-05-05 LAB — PROTIME-INR
INR: 0.99
Prothrombin Time: 13.3 seconds (ref 11.4–15.0)

## 2015-05-05 LAB — TYPE AND SCREEN
ABO/RH(D): A POS
Antibody Screen: NEGATIVE

## 2015-05-05 LAB — ABO/RH: ABO/RH(D): A POS

## 2015-05-05 LAB — APTT: APTT: 29 s (ref 24–36)

## 2015-05-05 MED ORDER — TETANUS-DIPHTHERIA TOXOIDS TD 5-2 LFU IM INJ
INJECTION | INTRAMUSCULAR | Status: AC
  Administered 2015-05-05: 0.5 mL via INTRAMUSCULAR
  Filled 2015-05-05: qty 0.5

## 2015-05-05 MED ORDER — HYDROMORPHONE HCL 1 MG/ML IJ SOLN
INTRAMUSCULAR | Status: AC
  Filled 2015-05-05: qty 1

## 2015-05-05 MED ORDER — HYDROMORPHONE HCL 1 MG/ML IJ SOLN
0.5000 mg | INTRAMUSCULAR | Status: AC | PRN
Start: 2015-05-05 — End: 2015-05-05
  Administered 2015-05-05 (×2): 0.5 mg via INTRAVENOUS

## 2015-05-05 MED ORDER — TETANUS-DIPHTHERIA TOXOIDS TD 5-2 LFU IM INJ
0.5000 mL | INJECTION | Freq: Once | INTRAMUSCULAR | Status: AC
  Administered 2015-05-05: 0.5 mL via INTRAMUSCULAR

## 2015-05-05 MED ORDER — ONDANSETRON HCL 4 MG/2ML IJ SOLN
4.0000 mg | Freq: Once | INTRAMUSCULAR | Status: AC
  Administered 2015-05-05: 4 mg via INTRAVENOUS

## 2015-05-05 MED ORDER — ONDANSETRON HCL 4 MG/2ML IJ SOLN
INTRAMUSCULAR | Status: AC
  Administered 2015-05-05: 4 mg via INTRAVENOUS
  Filled 2015-05-05: qty 2

## 2015-05-05 MED ORDER — MORPHINE SULFATE 4 MG/ML IJ SOLN
INTRAMUSCULAR | Status: AC
  Administered 2015-05-05: 4 mg via INTRAVENOUS
  Filled 2015-05-05: qty 1

## 2015-05-05 MED ORDER — MORPHINE SULFATE 4 MG/ML IJ SOLN
4.0000 mg | Freq: Once | INTRAMUSCULAR | Status: AC
  Administered 2015-05-05: 4 mg via INTRAVENOUS

## 2015-05-05 NOTE — ED Notes (Signed)
Pt port accessed successfully at this time, pt tolerated well.

## 2015-05-05 NOTE — ED Provider Notes (Signed)
I've accepted signout from Dr. Dineen Kid for this 64-related with an acute fracture of her left femur. I saw the patient himself at approximately 1700. She is alert and was overall comfortable oh she says the pain was coming back some. Dr. Dineen Kid has initiated a transfer to [facility] after having spoken with our local orthopedic doctor. Their concerns due to the patient's complex medical condition and the oblique fracture that extends into the joint.  Now, at 1725, the patient is asking for more pain medication. I will treated with Dilaudid 0.5 mg with 1 repeat when necessary.  Transfer pending.  ----------------------------------------- 5:41 PM on 05/05/2015 -----------------------------------------  I discussed the case with Dr. Dorothyann Peng, orthopedics at [facility].  He is comfortable treating the orthopedic injury but is asking that we speak with the medical service and have them admit her due to her comp looks medical condition with COPD diabetes and obesity. The deep transfer center reports he will call us back to facilitate that Dilaudid.  ----------------------------------------- 5:58 PM on 05/05/2015 -----------------------------------------  I discussed the case with Dr. Ninamarie Roers, internal medicine, and she is accepting the patient to [facility] hospital for ongoing care. She has access to much of the records from [facility] as the patient receives her care there.     Ahmed Prima, MD 05/06/15 [phone]

## 2015-05-05 NOTE — ED Notes (Signed)
Pt's daughter would like to be contacted when pt is transferred out.  Loletha Grayer cell # [phone]

## 2015-05-05 NOTE — ED Provider Notes (Signed)
[facility] Emergency Department Provider Note  ____________________________________________  Time seen: Seen upon arrival to the emergency department.  I have reviewed the triage vital signs and the nursing notes.   HISTORY  Chief Complaint Fall and Knee Injury    HPI Madeline [name] is a 64 y.o. female with a history of MS and breast cancer who presents today after a slip and fall in her bathroom at home. She said that she slipped on the floor of her bathroom falling forward onto her knees bilaterally. She has marked pain to the left knee although there is pain bilaterally. She is able to feel her legs fully. She did not hit her head or lose consciousness. The only blood thinner she takes is aspirin. There was no loss of consciousness.   Past Medical History  Diagnosis Date  . Depression   . Goiter, nodular     multi  . Dyslipidemia   . NIDDM, uncontrolled, with neuropathy   . Hypertension   . Allergy   . GERD (gastroesophageal reflux disease)   . Urinary incontinence   . Peripheral neuropathy   . Multiple sclerosis   . Breast cancer   . Hypothyroidism   . Asthma   . COPD (chronic obstructive pulmonary disease)   . DVT (deep venous thrombosis) 1990's    right leg  . Gout     Patient Active Problem List   Diagnosis Date Noted  . Neuropathy due to secondary diabetes 04/13/2015  . DDD (degenerative disc disease), lumbar 04/13/2015  . Lumbar facet arthropathy 04/13/2015  . Diabetic foot ulcer 04/13/2015  . 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 Abnormality of gait 12/16/2013  . Memory loss 12/16/2013  . Edema 07/06/2013  . COPD exacerbation 06/20/2013  . Gout   . Obstructive chronic bronchitis with exacerbation 01/27/2013  . Routine general medical examination at a health care facility 09/27/2012  . COPD (chronic obstructive pulmonary disease) with emphysema 04/01/2010  . Hyperlipemia 01/31/2010  .  HYPOTHYROIDISM 06/01/2009  . ADENOCARCINOMA, BREAST, LEFT 02/08/2009  . Multiple sclerosis 08/16/2008  . Type 2 diabetes mellitus with neurologic complication 83/41/9622  . ALLERGIC RHINITIS 06/24/2007  . GERD 06/24/2007  . URINARY INCONTINENCE 06/24/2007  . Episodic mood disorder 06/05/2007  . Essential hypertension, benign 06/05/2007    Past Surgical History  Procedure Laterality Date  . Thyroidectomy  09/2004  . Transthoracic echocardiogram  05/16/2004  . Partial hysterectomy  1975  . Axillary hidradenitis excision  1993    Excision biopsy growth right axilla, benign   . Other surgical history      Thyroid biopsy 10/99  . Carpal tunnel release  03/2008    bilateral  . Mastectomy, radical  02/2009    left modified  . Tonsillectomy    . Appendectomy      Current Outpatient Rx  Name  Route  Sig  Dispense  Refill  . albuterol (PROVENTIL) (2.5 MG/3ML) 0.083% nebulizer solution   Nebulization   Take 3 mLs (2.5 mg total) by nebulization every 6 (six) hours as needed for wheezing or shortness of breath.   75 mL   11   . allopurinol (ZYLOPRIM) 300 MG tablet   Oral   Take 1 tablet (300 mg total) by mouth daily.   90 tablet   3   . aspirin 325 MG tablet   Oral   Take 325 mg by mouth daily.           Marland Kitchen  baclofen (LIORESAL) 10 MG tablet      Limit 1 tablet by mouth twice a day to 3 times a day if tolerated   80 each   0   . budesonide-formoterol (SYMBICORT) 160-4.5 MCG/ACT inhaler   Inhalation   Inhale 1 puff into the lungs 2 (two) times daily.   10.2 g   5   . Calcium Carbonate-Vitamin D (CALCIUM-VITAMIN D) 600-200 MG-UNIT CAPS   Oral   Take by mouth daily.           . carvedilol (COREG) 25 MG tablet   Oral   Take 1 tablet (25 mg total) by mouth 2 (two) times daily with a meal.   180 tablet   3   . colchicine 0.6 MG tablet   Oral   Take 1 tablet (0.6 mg total) by mouth 2 (two) times daily as needed.   60 tablet   11   . COPAXONE 40 MG/ML SOSY       Inject 18ml (40mg ) subcutaneously three times a week.   36 Syringe   3     Dispense as written.   . diazepam (VALIUM) 5 MG tablet   Oral   Take 0.5-1 tablets (2.5-5 mg total) by mouth 2 (two) times daily as needed for anxiety.   60 tablet   0   . fish oil-omega-3 fatty acids 1000 MG capsule   Oral   Take 2 g by mouth daily.           Marland Kitchen FLUoxetine (PROZAC) 20 MG capsule      TAKE 2 CAPSULES BY MOUTH EVERY DAY   180 capsule   3   . furosemide (LASIX) 80 MG tablet   Oral   Take 1 tablet (80 mg total) by mouth daily. Take second dose around lunchtime if your legs are swelling   180 tablet   3     Take second dose around lunchtime if your legs are ...   . gabapentin (NEURONTIN) 300 MG capsule   Oral   Take 1 capsule (300 mg total) by mouth 4 (four) times daily. Limit 1-2 tabs by mouth 2-4 times per day if tolerated   240 capsule   0   . glucose blood (ONE TOUCH TEST STRIPS) test strip      Use as instructed to test blood sugar 3 times daily dx: E11.40   300 each   3   . insulin lispro (HUMALOG) 100 UNIT/ML KiwkPen      Use according to sliding scale as directed   15 mL   3   . Insulin Pen Needle 30G X 5 MM MISC      Use as instructed to inject insulin 2-3 times daily dx 250.60   300 each   6   . LANTUS SOLOSTAR 100 UNIT/ML Solostar Pen   Subcutaneous   Inject 45 Units into the skin 2 (two) times daily.            Dispense as written.   Marland Kitchen levothyroxine (SYNTHROID, LEVOTHROID) 175 MCG tablet   Oral   Take 1 tablet (175 mcg total) by mouth daily.   90 tablet   3   . meloxicam (MOBIC) 15 MG tablet   Oral   Take 1 tablet (15 mg total) by mouth daily as needed. Patient taking differently: Take 15 mg by mouth daily.    90 tablet   3   . metFORMIN (GLUCOPHAGE) 1000 MG tablet   Oral  Take 1,000 mg by mouth daily with breakfast.          . omeprazole (PRILOSEC) 20 MG capsule      TAKE ONE CAPSULE BY MOUTH TWICE DAILY   180 capsule   3   .  oxyCODONE (OXY IR/ROXICODONE) 5 MG immediate release tablet      Limit 1 tablet by mouth 2-4 times per day if tolerated   120 tablet   0   . potassium chloride (KLOR-CON M10) 10 MEQ tablet      TAKE 2 TABLETS BY MOUTH EVERY DAY   180 tablet   3   . tiotropium (SPIRIVA HANDIHALER) 18 MCG inhalation capsule      PLACE 1 CAPSULE (18 MCG TOTAL) INTO INHALER AND INHALE DAILY AS NEEDED.   90 capsule   3   . traZODone (DESYREL) 100 MG tablet      Take 2 tabs at bedtime.   225 tablet   3     Allergies Penicillins; Trovan; and Rosiglitazone maleate  Family History  Problem Relation Age of Onset  . Kidney failure Mother   . Heart disease Father 74  . Heart disease Brother   . Heart disease Brother     Social History History  Substance Use Topics  . Smoking status: Current Every Day Smoker -- 1.00 packs/day for 53 years    Types: Cigarettes    Last Attempt to Quit: 11/06/2013  . Smokeless tobacco: Never Used  . Alcohol Use: No    Review of Systems Constitutional: No fever/chills Eyes: No visual changes. ENT: No sore throat. Cardiovascular: Denies chest pain. Respiratory: Denies shortness of breath. Gastrointestinal: No abdominal pain.  No nausea, no vomiting.  No diarrhea.  No constipation. Genitourinary: Negative for dysuria. Musculoskeletal: Negative for back pain. Skin: Negative for rash. Neurological: Negative for headaches, focal weakness or numbness.  10-point ROS otherwise negative.  ____________________________________________   PHYSICAL EXAM:  VITAL SIGNS: ED Triage Vitals  Enc Vitals Group     BP 05/05/15 1429 121/61 mmHg     Pulse Rate 05/05/15 1429 67     Resp 05/05/15 1429 20     Temp 05/05/15 1429 98.2 F (36.8 C)     Temp Source 05/05/15 1429 Oral     SpO2 05/05/15 1429 95 %     Weight 05/05/15 1429 269 lb (122.018 kg)     Height 05/05/15 1429 5\' 3"  (1.6 m)     Head Cir --      Peak Flow --      Pain Score 05/05/15 1430 6     Pain  Loc --      Pain Edu? --      Excl. in [place]? --     Constitutional: Alert and oriented. Well appearing and in no acute distress. Eyes: Conjunctivae are normal. PERRL. EOMI. Head: Atraumatic. Nose: No congestion/rhinnorhea. Mouth/Throat: Mucous membranes are moist.  Oropharynx non-erythematous. Neck: No stridor.   Cardiovascular: Normal rate, regular rhythm. Grossly normal heart sounds.  Good peripheral circulation. Respiratory: Normal respiratory effort.  No retractions. Lungs CTAB. Gastrointestinal: Soft and nontender. No distention. No abdominal bruits. No CVA tenderness. Musculoskeletal: Tenderness to the bilateral knees diffusely. Painful range of motion bilaterally which reduces the extent of motion. Left lower extremity shortened and externally rotated. Patient is neurovascularly intact with bilateral dorsalis pedis pulses. Is able to move the toes bilaterally. Neurologic:  Normal speech and language. No gross focal neurologic deficits are appreciated. Speech is normal. No gait instability.  Skin:  Skin is warm, dry .  Superficial abrasion over the right anterior knee. No rash noted. Psychiatric: Mood and affect are normal. Speech and behavior are normal.  ____________________________________________   LABS (all labs ordered are listed, but only abnormal results are displayed)  Labs Reviewed  CBC WITH DIFFERENTIAL/PLATELET - Abnormal; Notable for the following:    Hemoglobin 10.6 (*)    HCT 34.5 (*)    MCV 78.0 (*)    MCH 24.0 (*)    MCHC 30.7 (*)    RDW 17.8 (*)    Neutro Abs 6.9 (*)    Monocytes Absolute 1.1 (*)    All other components within normal limits  BASIC METABOLIC PANEL - Abnormal; Notable for the following:    Chloride 96 (*)    Glucose, Bld 110 (*)    Calcium 8.7 (*)    All other components within normal limits  PROTIME-INR  APTT  TYPE AND SCREEN   ____________________________________________  EKG  ED ECG REPORT I, Doran Stabler, the attending  physician, personally viewed and interpreted this ECG.   Date: 05/05/2015  EKG Time: 1615  Rate: 69  Rhythm: normal sinus rhythm  Axis: Normal axis  Intervals:none  ST&T Change: No ST elevations or depressions. No abnormal T-wave inversions.  ____________________________________________  RADIOLOGY  Maj. x-ray findings included comminuted, oblique intra-articular fracture involving the mid and distal left femur. I personally reviewed the films. ____________________________________________   PROCEDURES   ____________________________________________   INITIAL IMPRESSION / ASSESSMENT AND PLAN / ED COURSE  Pertinent labs & imaging results that were available during my care of the patient were reviewed by me and considered in my medical decision making (see chart for details).  ----------------------------------------- 4:55 PM on 05/05/2015 -----------------------------------------  Patient resting comfortably after pain medication. Discussed case with Dr. Roland Rack of orthopedics at [facility] who says we do not have the resources to fix this fracture here in the operating room. He thinks that the patient would be best suited at a trauma center or tertiary care center where there will be more resources for complex fracture as well as more team members in the operating room to help lift the left leg for proper alignment and fixation. The family requested to be transferred to [facility]. However, I talked to [facility] Sherrian Divers at [facility] who says that he also does not have the resources. The patient's family and the patient requesting [facility].  I have talked to the transfer center. Dr. Thomasene Lot to follow-up with transfer center regarding M.D. 2 M.D. signout and final disposition. Patient remains neurovascularly intact. ____________________________________________   FINAL CLINICAL IMPRESSION(S) / ED DIAGNOSES  Final diagnoses:  Fall  Femur fracture, left, closed, initial encounter      Orbie Pyo, MD 05/05/15 [phone]

## 2015-05-05 NOTE — ED Notes (Addendum)
Pt to ED via EMS after slipping and falling while getting in shower. Pt with c/c of left knee pain, left leg appears rotated in and shortened. Pt tender to palpation along entire left leg. Pt states pain is 6/10. Pt AAOx3, vitals wnl. MD schaevitz at bedside on arrival.

## 2015-05-05 NOTE — ED Notes (Signed)
Pt to Xray at this time

## 2015-05-05 NOTE — ED Notes (Signed)
Pt's daughter contacted in regards to pt's departure. Pt's daughter verbalized understanding. Pt left at this time via EMS.

## 2015-05-07 ENCOUNTER — Other Ambulatory Visit: Payer: Self-pay

## 2015-05-09 ENCOUNTER — Ambulatory Visit: Payer: Medicare Other | Admitting: Pain Medicine

## 2015-05-10 ENCOUNTER — Telehealth: Payer: Self-pay | Admitting: *Deleted

## 2015-05-10 ENCOUNTER — Emergency Department
Admission: EM | Admit: 2015-05-10 | Discharge: 2015-05-10 | Disposition: A | Discharge status: Home / Self Care | Payer: Medicare Other | Attending: Emergency Medicine | Admitting: Emergency Medicine

## 2015-05-10 ENCOUNTER — Encounter: Payer: Self-pay | Admitting: Emergency Medicine

## 2015-05-10 DIAGNOSIS — E1149 Type 2 diabetes mellitus with other diabetic neurological complication: Secondary | ICD-10-CM | POA: Diagnosis not present

## 2015-05-10 DIAGNOSIS — E11610 Type 2 diabetes mellitus with diabetic neuropathic arthropathy: Secondary | ICD-10-CM | POA: Insufficient documentation

## 2015-05-10 DIAGNOSIS — M79605 Pain in left leg: Secondary | ICD-10-CM | POA: Diagnosis not present

## 2015-05-10 DIAGNOSIS — I10 Essential (primary) hypertension: Secondary | ICD-10-CM | POA: Insufficient documentation

## 2015-05-10 DIAGNOSIS — Z880 Allergy status to penicillin: Secondary | ICD-10-CM | POA: Diagnosis not present

## 2015-05-10 DIAGNOSIS — Z720 Tobacco use: Secondary | ICD-10-CM | POA: Insufficient documentation

## 2015-05-10 DIAGNOSIS — E11621 Type 2 diabetes mellitus with foot ulcer: Secondary | ICD-10-CM | POA: Diagnosis not present

## 2015-05-10 DIAGNOSIS — G8918 Other acute postprocedural pain: Secondary | ICD-10-CM | POA: Diagnosis not present

## 2015-05-10 DIAGNOSIS — L97509 Non-pressure chronic ulcer of other part of unspecified foot with unspecified severity: Secondary | ICD-10-CM | POA: Insufficient documentation

## 2015-05-10 DIAGNOSIS — R531 Weakness: Secondary | ICD-10-CM | POA: Diagnosis not present

## 2015-05-10 MED ORDER — HYDROMORPHONE HCL 1 MG/ML IJ SOLN
INTRAMUSCULAR | Status: AC
  Filled 2015-05-10: qty 1

## 2015-05-10 MED ORDER — HYDROMORPHONE HCL 1 MG/ML IJ SOLN
1.0000 mg | Freq: Once | INTRAMUSCULAR | Status: AC
  Administered 2015-05-10: 1 mg via INTRAMUSCULAR

## 2015-05-10 NOTE — ED Notes (Signed)
Pt requested i take out her medport access, which i did. She has no iv antibiotics ordered and is going home with her daughter.

## 2015-05-10 NOTE — Telephone Encounter (Signed)
Spoke with daughter and she states this happens all the time when pt needs rehab, daughter states pt lasted 4 days at [facility], and 4 hours at another rehab facility. Daughter told the dr's at [facility] that pt would probably refuse care and that she would do her best to take care of her but she needs equipment. Please advise

## 2015-05-10 NOTE — ED Notes (Signed)
Daughter at bedside.

## 2015-05-10 NOTE — Telephone Encounter (Signed)
I can't imagine what could have happened for them to have such a serious reaction when she only got there today Obviously she cannot be cared for by daugher in home with fractured femur  Will have to go to ER and we will follow up

## 2015-05-10 NOTE — ED Notes (Signed)
Pt has an accessed port a cath from previous facility, date intact on dressing 05/06/15

## 2015-05-10 NOTE — Telephone Encounter (Signed)
Call from daughter that pt was transferred to [facility] today around 12, after having surgery on Sunday for breaking her femur. Per daughter pt is now refusing care and is being "put out" daughter states the CNA was not nice at first. Daughter had to leave around 3 and she's not sure what happened after then. Per daughter [facility] called her said pt is being discharged and will be transported by ambulance to her home, daughter states she told them to take her to [facility] because she can't take care of pt in her home without the appropriate equipment.

## 2015-05-10 NOTE — ED Notes (Signed)
D/c'd the old iv and catheter sites from previous visits.

## 2015-05-10 NOTE — ED Provider Notes (Addendum)
[facility] Emergency Department Provider Note     Time seen: ----------------------------------------- 6:48 PM on 05/10/2015 -----------------------------------------    I have reviewed the triage vital signs and the nursing notes.   HISTORY  Chief Complaint No chief complaint on file.    HPI Madeline [name] is a 64 y.o. female who presents to ER for pain control and to help with ambulation. Patient signed out of a nursing home due to inadequate care. Patient reportedly had left 3 nursing homes in the last 4 days. 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 She cannot ambulate at all without maximal assistance and requires a [employer] fast streamer. She states they did not treat her well at the nursing home so she signed out AMA. She is complaining of severe pain in her legs currently, denies other complaints.   Past Medical History  Diagnosis Date  . Depression   . Goiter, nodular     multi  . Dyslipidemia   . NIDDM, uncontrolled, with neuropathy   . Hypertension   . Allergy   . GERD (gastroesophageal reflux disease)   . Urinary incontinence   . Peripheral neuropathy   . Multiple sclerosis   . Breast cancer   . Hypothyroidism   . Asthma   . COPD (chronic obstructive pulmonary disease)   . DVT (deep venous thrombosis) 1990's    right leg  . Gout     Patient Active Problem List   Diagnosis Date Noted  . Neuropathy due to secondary diabetes 04/13/2015  . DDD (degenerative disc disease), lumbar 04/13/2015  . Lumbar facet arthropathy 04/13/2015  . Diabetic foot ulcer 04/13/2015  . 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 Abnormality of gait 12/16/2013  . Memory loss 12/16/2013  . Edema 07/06/2013  . COPD exacerbation 06/20/2013  . Gout   . Obstructive chronic bronchitis with exacerbation 01/27/2013  . Routine general medical examination at a health care facility 09/27/2012  .  COPD (chronic obstructive pulmonary disease) with emphysema 04/01/2010  . Hyperlipemia 01/31/2010  . HYPOTHYROIDISM 06/01/2009  . ADENOCARCINOMA, BREAST, LEFT 02/08/2009  . Multiple sclerosis 08/16/2008  . Type 2 diabetes mellitus with neurologic complication 10/62/6948  . ALLERGIC RHINITIS 06/24/2007  . GERD 06/24/2007  . URINARY INCONTINENCE 06/24/2007  . Episodic mood disorder 06/05/2007  . Essential hypertension, benign 06/05/2007    Past Surgical History  Procedure Laterality Date  . Thyroidectomy  09/2004  . Transthoracic echocardiogram  05/16/2004  . Partial hysterectomy  1975  . Axillary hidradenitis excision  1993    Excision biopsy growth right axilla, benign   . Other surgical history      Thyroid biopsy 10/99  . Carpal tunnel release  03/2008    bilateral  . Mastectomy, radical  02/2009    left modified  . Tonsillectomy    . Appendectomy      Allergies Penicillins; Trovan; and Rosiglitazone maleate  Social History History  Substance Use Topics  . Smoking status: Current Every Day Smoker -- 1.00 packs/day for 53 years    Types: Cigarettes    Last Attempt to Quit: 11/06/2013  . Smokeless tobacco: Never Used  . Alcohol Use: No    Review of Systems Constitutional: Negative for fever. Eyes: Negative for visual changes. ENT: Negative for sore throat. Cardiovascular: Negative for chest pain. Respiratory: Negative for shortness of breath. Gastrointestinal: Negative for abdominal pain, vomiting and diarrhea. Genitourinary: Negative for dysuria. Musculoskeletal: Negative  for back pain. Positive for left leg pain Skin: Negative for rash. Neurological: Positive for weakness  10-point ROS otherwise negative.  ____________________________________________   PHYSICAL EXAM:  VITAL SIGNS: ED Triage Vitals  Enc Vitals Group     BP --      Pulse --      Resp --      Temp --      Temp src --      SpO2 --      Weight --      Height --      Head Cir --       Peak Flow --      Pain Score --      Pain Loc --      Pain Edu? --      Excl. in [place]? --     Constitutional: Alert and oriented. Well appearing and in no distress. Eyes: Conjunctivae are normal. PERRL. Normal extraocular movements. ENT   Head: Normocephalic and atraumatic.   Nose: No congestion/rhinnorhea.   Mouth/Throat: Mucous membranes are moist.   Neck: No stridor. Hematological/Lymphatic/Immunilogical: No cervical lymphadenopathy. Cardiovascular: Normal rate, regular rhythm. Normal and symmetric distal pulses are present in all extremities. No murmurs, rubs, or gallops. Respiratory: Normal respiratory effort without tachypnea nor retractions. Breath sounds are clear and equal bilaterally. No wheezes/rales/rhonchi. Gastrointestinal: Soft and nontender. No distention. No abdominal bruits. There is no CVA tenderness. Musculoskeletal: Tender left lower extremity which is in a knee immobilizer and wrap. Neurologic:  Normal speech and language. No gross focal neurologic deficits are appreciated. Speech is normal.  Skin:  Skin is warm, dry and intact. No rash noted. Psychiatric: Patient with exceedingly poor insight and judgment. ____________________________________________  ED COURSE:  Pertinent labs & imaging results that were available during my care of the patient were reviewed by me and considered in my medical decision making (see chart for details). Patient is in no acute distress, unfortunately refused the only care that she was getting. I'm unsure for placement at this time. She may need social work and will likely stay in the ER for several days. ____________________________________________ RADIOLOGY  None  ____________________________________________  FINAL ASSESSMENT AND PLAN  Weakness, postoperative pain  Plan: We'll discuss with care manager and possibly social work to find permanent placement for the patient. [facility] that  since she received a commode and 2012 she does not qualify for another one, also that since she has trapezial home she cannot have a [facility] lift.  Earleen Newport, MD   Earleen Newport, MD 05/10/15 1911  Family was to take the patient home. We will arrange same. I'll try to print discharge summary from recent hospitalization at [facility].  Earleen Newport, MD 05/10/15 1921  Earleen Newport, MD 05/10/15 [phone]

## 2015-05-10 NOTE — ED Notes (Signed)
MD at bedside, case manager at bedside addressing rehab issues with daughter and pt.

## 2015-05-10 NOTE — Care Management (Signed)
Patient presented via EMS from [facility].  Patient was discharged from [facility] today.  Patient signed out AMA from [facility]. I called and spoke to the supervisor at [facility].  She stated that since the patient was discharged and left AMA she would not be able to return to the facility tonight.  Daughter who is POA is at bedside.  Per daughter in order for patient to return to her home residence hoyer lift and bedside commode needed.  I have put a call out to [facility] to see if they patient would be able to obtain a lift and commode.  Representative to return call to Dr. Jimmye Norman.  Dr. Jimmye Norman explained in detail to the patient and daughter that the patient is now in a situation where she is not able to return to rehab tonight and also will not be able to receive equipment tonight as well.  Social work consult entered by Dr. Jimmye Norman.

## 2015-05-10 NOTE — ED Notes (Signed)
dtr requesting to see if she is able to put the pt on a bedpan, which she was. The pt rolled to the left quite easily. Pt has a hospital bed with a trapeze at home. dtr requesting to have pt released to her and she will care for the pt at home. Dr. Jimmye Norman made area and is in talking to them.

## 2015-05-10 NOTE — Discharge Instructions (Signed)
Medical Screening Exam °A medical screening exam has been done. This exam helps find the cause of your problem and determines whether you need emergency treatment. Your exam has shown that you do not need emergency treatment at this point. It is safe for you to go to your caregiver's office or clinic for treatment. You should make an appointment today to see your caregiver as soon as he or she is available. °Depending on your illness, your symptoms and condition can change over time. If your condition gets worse or you develop new or troubling symptoms before you see your caregiver, you should return to the emergency department for further evaluation.  °Document Released: 12/04/2004 Document Revised: 01/19/2012 Document Reviewed: 07/16/2011 °Exit[employer]® Patient Information ©2015 [employer]. This information is not intended to replace advice given to you by your health care provider. Make sure you discuss any questions you have with your health care provider. ° °

## 2015-05-10 NOTE — ED Notes (Signed)
Pt here with c/o left leg pain, is needing her pain medication restarted. States she wants to be sent home for rehab.

## 2015-05-11 ENCOUNTER — Other Ambulatory Visit: Payer: Self-pay

## 2015-05-11 MED ORDER — OXYCODONE HCL 5 MG PO TABS: ORAL_TABLET | ORAL | Status: DC

## 2015-05-11 MED ORDER — DIAZEPAM 5 MG PO TABS: 2.5000 mg | ORAL_TABLET | Freq: Two times a day (BID) | ORAL | Status: DC | PRN

## 2015-05-11 NOTE — Telephone Encounter (Signed)
Spoke with daughter and pt was at [facility] was sent to daughter's house with bedpan, per daughter the dr's at the ER put in orders for [facility] lift and bedside commode, per daughter she will also need home health. Daughter is trying to reach the dr's at [facility] to help with some of these orders.

## 2015-05-11 NOTE — Telephone Encounter (Signed)
[facility] 

## 2015-05-11 NOTE — Telephone Encounter (Signed)
Actually, the Madeline [name] doctors should take care of this. ER note shows she got bedside commode 2 years ago and can't get this again. They stated that her house would not [employer] lift Really needs to coordinate care through her ortho appt (for the home health,etc --since they know what she should have)

## 2015-05-11 NOTE — Telephone Encounter (Signed)
Find out status this morning Need to know what equipment she needs---certainly will qualify with fractured femur--- hospital bed with trapeze? Bedside commode? Will need home PT/OT. Let Madeline [name] help-- I will put in order once we know what she needs

## 2015-05-11 NOTE — Telephone Encounter (Signed)
Pt called and pt has a bedside commode at home but the bucket like pot that sits in the commode is broken and pt wants to know where she can get just the pot. [facility] at [facility] will sell the pot and lid for $16.95. Notified pt and when she told her daughter, [name] said she knows where to get a replacement pot but pt is not able to get on the bedside commode. Pt asked what can be done to help her. Advised pt to contact [facility] doctor that admitted her to [facility] for assistance with her care. [name] voiced understanding. Send to Dr Silvio Pate as Juluis Rainier.

## 2015-05-11 NOTE — Telephone Encounter (Signed)
Spoke with daughter and Madeline [name] is handing all the orders for equipment.

## 2015-05-12 NOTE — Telephone Encounter (Signed)
Please check on her on Tuesday

## 2015-05-15 ENCOUNTER — Telehealth: Payer: Self-pay | Admitting: Pain Medicine

## 2015-05-15 ENCOUNTER — Inpatient Hospital Stay: Payer: Medicare Other

## 2015-05-15 DIAGNOSIS — G35 Multiple sclerosis: Secondary | ICD-10-CM | POA: Diagnosis not present

## 2015-05-15 NOTE — Telephone Encounter (Signed)
Spoke with patient and she states she's doing well, Madeline [name] her daughter is handing everything. Pt states [facility] is handing all orders.

## 2015-05-15 NOTE — Telephone Encounter (Signed)
Spoke with [name], informed ok to fill prescription, advised to monitor for adverse side effects.

## 2015-05-15 NOTE — Telephone Encounter (Signed)
Dr. Primus Bravo, please advise.

## 2015-05-15 NOTE — Telephone Encounter (Signed)
Madeline [name] had surgery 05/06/15. Pt fell broke left femur. They sent her home with pain meds. 5mg  oxycodone #80. Pt daughter wants to make sure ok to fill.

## 2015-05-15 NOTE — Telephone Encounter (Signed)
Nurses  Please inform patient that she may get the oxycodone Remind patient to exercise extreme caution since the oxycodone can cause drowsiness confusion respiratory depression and other undesirable side effects

## 2015-05-16 NOTE — Telephone Encounter (Signed)
Good to hear. Let her know if she is having problems and needs to be seen, I can put her on my home visit list

## 2015-05-18 ENCOUNTER — Encounter: Payer: Self-pay | Admitting: Internal Medicine

## 2015-05-18 NOTE — Telephone Encounter (Signed)
Pt called and still does not have home health or any assistance in home except pts daughter is assisting pt; pt request Dr Silvio Pate to do home visit. Pt request cb.

## 2015-05-18 NOTE — Telephone Encounter (Signed)
Dee and I both called daughter [name]. The confusion has been that [facility] was the first one called and they dont service her area. Second one called was [facility] and they are out of network. Daughter had to leave a VM that [facility] is the only agency in the network. I called [name] back and gave her [facility] number and also the main number to [facility] to try to get thru to them to ask them to call in a verbal order. She was going to call [facility] again before 5 to facilitate the verbal order needed to start the Home Care.

## 2015-05-18 NOTE — Telephone Encounter (Signed)
I spoke to her I will put her on my list and get out to see her hopefully next week or the week after  Rosaria Ferries, Please check with her daughter. When she left [facility] ([facility]), I thought they were going to contact [facility] and her ortho about getting home health. The surgeon will need to specify what she can do in therapy so that is why I didn't want to do it Thanks

## 2015-05-21 ENCOUNTER — Telehealth: Payer: Self-pay

## 2015-05-21 NOTE — Telephone Encounter (Signed)
Margaretha Sheffield RN case mgr left v/m; pt was discharged after fx of femur 05/10/15; pt was admitted to [facility] for a few hours and pt was discharged from [facility] to [facility] ED due to pt not getting her meds at [facility]; [facility] has not seen pt yet; [facility] is supposed to see pt on 05/22/15; Margaretha Sheffield concerned pt has not been seen by [facility] yet. Pt did not know discharge instructions since left [facility]. Margaretha Sheffield is concerned about pt not getting good treatment at skilled nursing facility.also concerned pt is taking Lovenox on her own and getting pain med. Margaretha Sheffield request cb with update on this pt. Margaretha Sheffield said [facility] may be more familiar with pt than Margaretha Sheffield is. Pt advised Margaretha Sheffield that Dr Silvio Pate will be making a home visit later this week.

## 2015-05-21 NOTE — Telephone Encounter (Signed)
I spoke to her last week and she seemed to be getting along in the acute sense--but not able to leave bed and probalby too much for daughter to handle I am planning to go Friday afternoon but am glad they are going to make it out tomorrow I don't know anything about the lovenox--- I got no information at all.  That is fairly easy to use if she got Rx--which I am not even sure about

## 2015-05-22 ENCOUNTER — Telehealth: Payer: Self-pay | Admitting: Neurology

## 2015-05-22 ENCOUNTER — Other Ambulatory Visit: Payer: Self-pay | Admitting: *Deleted

## 2015-05-22 DIAGNOSIS — A5216 Charcot's arthropathy (tabetic): Secondary | ICD-10-CM | POA: Diagnosis not present

## 2015-05-22 DIAGNOSIS — G35 Multiple sclerosis: Secondary | ICD-10-CM | POA: Diagnosis not present

## 2015-05-22 DIAGNOSIS — J449 Chronic obstructive pulmonary disease, unspecified: Secondary | ICD-10-CM | POA: Diagnosis not present

## 2015-05-22 DIAGNOSIS — M109 Gout, unspecified: Secondary | ICD-10-CM | POA: Diagnosis not present

## 2015-05-22 DIAGNOSIS — S72332D Displaced oblique fracture of shaft of left femur, subsequent encounter for closed fracture with routine healing: Secondary | ICD-10-CM | POA: Diagnosis not present

## 2015-05-22 DIAGNOSIS — E1121 Type 2 diabetes mellitus with diabetic nephropathy: Secondary | ICD-10-CM | POA: Diagnosis not present

## 2015-05-22 MED ORDER — BUDESONIDE-FORMOTEROL FUMARATE 160-4.5 MCG/ACT IN AERO: 1.0000 | INHALATION_SPRAY | Freq: Two times a day (BID) | RESPIRATORY_TRACT | Status: DC

## 2015-05-22 NOTE — Telephone Encounter (Signed)
Events noted

## 2015-05-22 NOTE — Telephone Encounter (Signed)
Patient called and wanted to make Dr. Jannifer Franklin aware that she fell and broke her leg recently. She was dizzy and believes this caused the fall. No need to return call.

## 2015-05-22 NOTE — Telephone Encounter (Signed)
Left detailed message for case manager on her VM, advised her to either call pt's daughter or she can call me back if she has any questions.

## 2015-05-23 DIAGNOSIS — G35 Multiple sclerosis: Secondary | ICD-10-CM | POA: Diagnosis not present

## 2015-05-23 DIAGNOSIS — E139 Other specified diabetes mellitus without complications: Secondary | ICD-10-CM | POA: Diagnosis not present

## 2015-05-23 DIAGNOSIS — R269 Unspecified abnormalities of gait and mobility: Secondary | ICD-10-CM | POA: Diagnosis not present

## 2015-05-24 DIAGNOSIS — A5216 Charcot's arthropathy (tabetic): Secondary | ICD-10-CM | POA: Diagnosis not present

## 2015-05-24 DIAGNOSIS — J449 Chronic obstructive pulmonary disease, unspecified: Secondary | ICD-10-CM | POA: Diagnosis not present

## 2015-05-24 DIAGNOSIS — M109 Gout, unspecified: Secondary | ICD-10-CM | POA: Diagnosis not present

## 2015-05-24 DIAGNOSIS — G35 Multiple sclerosis: Secondary | ICD-10-CM | POA: Diagnosis not present

## 2015-05-24 DIAGNOSIS — E1121 Type 2 diabetes mellitus with diabetic nephropathy: Secondary | ICD-10-CM | POA: Diagnosis not present

## 2015-05-24 DIAGNOSIS — S72332D Displaced oblique fracture of shaft of left femur, subsequent encounter for closed fracture with routine healing: Secondary | ICD-10-CM | POA: Diagnosis not present

## 2015-05-25 ENCOUNTER — Ambulatory Visit: Payer: Medicare Other | Admitting: Internal Medicine

## 2015-05-25 ENCOUNTER — Encounter: Payer: Self-pay | Admitting: Internal Medicine

## 2015-05-25 VITALS — BP 112/64 | HR 76 | Resp 18

## 2015-05-25 DIAGNOSIS — E1121 Type 2 diabetes mellitus with diabetic nephropathy: Secondary | ICD-10-CM | POA: Diagnosis not present

## 2015-05-25 DIAGNOSIS — J439 Emphysema, unspecified: Secondary | ICD-10-CM | POA: Diagnosis not present

## 2015-05-25 DIAGNOSIS — G35 Multiple sclerosis: Secondary | ICD-10-CM | POA: Diagnosis not present

## 2015-05-25 DIAGNOSIS — E1140 Type 2 diabetes mellitus with diabetic neuropathy, unspecified: Secondary | ICD-10-CM

## 2015-05-25 DIAGNOSIS — M109 Gout, unspecified: Secondary | ICD-10-CM | POA: Diagnosis not present

## 2015-05-25 DIAGNOSIS — A5216 Charcot's arthropathy (tabetic): Secondary | ICD-10-CM | POA: Diagnosis not present

## 2015-05-25 DIAGNOSIS — S72332D Displaced oblique fracture of shaft of left femur, subsequent encounter for closed fracture with routine healing: Secondary | ICD-10-CM | POA: Diagnosis not present

## 2015-05-25 DIAGNOSIS — S7292XA Unspecified fracture of left femur, initial encounter for closed fracture: Secondary | ICD-10-CM

## 2015-05-25 DIAGNOSIS — K219 Gastro-esophageal reflux disease without esophagitis: Secondary | ICD-10-CM

## 2015-05-25 DIAGNOSIS — J449 Chronic obstructive pulmonary disease, unspecified: Secondary | ICD-10-CM | POA: Diagnosis not present

## 2015-05-25 NOTE — Assessment & Plan Note (Signed)
Surgical repair almost a month ago Still NWB so bedbound in daughter's home Finally getting home health lovenox till ambulatory (aspirin 81 daily while on lovenox)

## 2015-05-25 NOTE — Assessment & Plan Note (Signed)
Lab Results  Component Value Date   HGBA1C 7.1* 04/26/2015   Seems to have good control still Charcot joint on right--but no ulcer

## 2015-05-25 NOTE — Assessment & Plan Note (Signed)
Doing well on current regimen .  

## 2015-05-25 NOTE — Assessment & Plan Note (Signed)
She thinks her dizziness and fall were related to this but not clear cut Still on copaxone

## 2015-05-25 NOTE — Assessment & Plan Note (Signed)
Okay on the PPI 

## 2015-05-25 NOTE — Progress Notes (Signed)
Subjective:    Patient ID: Madeline [name], female    DOB: 05/15/1951, 64 y.o.   MRN: [id number]  HPI Initial home visit  Daughter here--she is home  Femoral fracture on left-- distal (6/25) Had surgery at [facility] No weight bearing yet--has follow up on Tuesday next week  Went to [facility] for rehab and signed out same day because they didn't have her pain meds ready Now in [facility] bed already with trapeze Can roll side to side Daughter changes her--provides meals, etc etc  Just had home health start this week RN and PT have started  Pain is not an issue  No breathing problems Oxygen at night  She thinks her dizziness getting out of shower chair may have been part of her MS No other neuro symptoms   No chest pain No palpitations  Checks sugars tid Takes humalog if premeal over 150---often doesn't need it  Current Outpatient Prescriptions on File Prior to Visit  Medication Sig Dispense Refill  . albuterol (PROVENTIL) (2.5 MG/3ML) 0.083% nebulizer solution Take 3 mLs (2.5 mg total) by nebulization every 6 (six) hours as needed for wheezing or shortness of breath. 75 mL 11  . allopurinol (ZYLOPRIM) 300 MG tablet Take 1 tablet (300 mg total) by mouth daily. 90 tablet 3  . aspirin 325 MG tablet Take 325 mg by mouth daily.      . baclofen (LIORESAL) 10 MG tablet Limit 1 tablet by mouth twice a day to 3 times a day if tolerated (Patient taking differently: 10 mg 2 (two) times daily. Third one prn) 80 each 0  . budesonide-formoterol (SYMBICORT) 160-4.5 MCG/ACT inhaler Inhale 1 puff into the lungs 2 (two) times daily. 10.2 g 5  . Calcium Carbonate-Vitamin D (CALCIUM-VITAMIN D) 600-200 MG-UNIT CAPS Take by mouth daily.      . carvedilol (COREG) 25 MG tablet Take 1 tablet (25 mg total) by mouth 2 (two) times daily with a meal. 180 tablet 3  . colchicine 0.6 MG tablet Take 1 tablet (0.6 mg total) by mouth 2 (two) times daily as needed. (Patient taking  differently: Take 0.6 mg by mouth 2 (two) times daily. ) 60 tablet 11  . COPAXONE 40 MG/ML SOSY Inject 43ml (40mg ) subcutaneously three times a week. 36 Syringe 3  . diazepam (VALIUM) 5 MG tablet Take 0.5-1 tablets (2.5-5 mg total) by mouth 2 (two) times daily as needed for anxiety. (Patient taking differently: Take 5 mg by mouth 2 (two) times daily. ) 60 tablet 0  . fish oil-omega-3 fatty acids 1000 MG capsule Take 2 g by mouth daily.      Marland Kitchen FLUoxetine (PROZAC) 20 MG capsule TAKE 2 CAPSULES BY MOUTH EVERY DAY 180 capsule 3  . furosemide (LASIX) 80 MG tablet Take 1 tablet (80 mg total) by mouth daily. Take second dose around lunchtime if your legs are swelling 180 tablet 3  . gabapentin (NEURONTIN) 300 MG capsule Take 1 capsule (300 mg total) by mouth 4 (four) times daily. Limit 1-2 tabs by mouth 2-4 times per day if tolerated 240 capsule 0  . glucose blood (ONE TOUCH TEST STRIPS) test strip Use as instructed to test blood sugar 3 times daily dx: E11.40 300 each 3  . insulin lispro (HUMALOG) 100 UNIT/ML KiwkPen Use according to sliding scale as directed 15 mL 3  . Insulin Pen Needle 30G X 5 MM MISC Use as instructed to inject insulin 2-3 times daily dx 250.60 300 each 6  .  LANTUS SOLOSTAR 100 UNIT/ML Solostar Pen Inject 45 Units into the skin 2 (two) times daily.     Marland Kitchen levothyroxine (SYNTHROID, LEVOTHROID) 175 MCG tablet Take 1 tablet (175 mcg total) by mouth daily. 90 tablet 3  . meloxicam (MOBIC) 15 MG tablet Take 1 tablet (15 mg total) by mouth daily as needed. (Patient taking differently: Take 15 mg by mouth daily. ) 90 tablet 3  . metFORMIN (GLUCOPHAGE) 1000 MG tablet Take 1,000 mg by mouth daily with breakfast.     . omeprazole (PRILOSEC) 20 MG capsule TAKE ONE CAPSULE BY MOUTH TWICE DAILY 180 capsule 3  . oxyCODONE (OXY IR/ROXICODONE) 5 MG immediate release tablet Limit 1 tablet by mouth 2-4 times per day if tolerated 120 tablet 0  . potassium chloride (KLOR-CON M10) 10 MEQ tablet TAKE 2  TABLETS BY MOUTH EVERY DAY 180 tablet 3  . tiotropium (SPIRIVA HANDIHALER) 18 MCG inhalation capsule PLACE 1 CAPSULE (18 MCG TOTAL) INTO INHALER AND INHALE DAILY AS NEEDED. 90 capsule 3  . traZODone (DESYREL) 100 MG tablet Take 2 tabs at bedtime. 225 tablet 3   No current facility-administered medications on file prior to visit.    Allergies  Allergen Reactions  . Penicillins Swelling    tongue and throat swelling tolerates cephalosporins  . Trovan [Alatrofloxacin] Hives  . Rosiglitazone Maleate Swelling    Past Medical History  Diagnosis Date  . Depression   . Goiter, nodular     multi  . Dyslipidemia   . NIDDM, uncontrolled, with neuropathy   . Hypertension   . Allergy   . GERD (gastroesophageal reflux disease)   . Urinary incontinence   . Peripheral neuropathy   . Multiple sclerosis   . Breast cancer   . Hypothyroidism   . Asthma   . COPD (chronic obstructive pulmonary disease)   . DVT (deep venous thrombosis) 1990's    right leg  . Gout     Past Surgical History  Procedure Laterality Date  . Thyroidectomy  09/2004  . Transthoracic echocardiogram  05/16/2004  . Partial hysterectomy  1975  . Axillary hidradenitis excision  1993    Excision biopsy growth right axilla, benign   . Other surgical history      Thyroid biopsy 10/99  . Carpal tunnel release  03/2008    bilateral  . Mastectomy, radical  02/2009    left modified  . Tonsillectomy    . Appendectomy      Family History  Problem Relation Age of Onset  . Kidney failure Mother   . Heart disease Father 45  . Heart disease Brother   . Heart disease Brother     History   Social History  . Marital Status: Widowed    Spouse Name: N/A  . Number of Children: 4  . 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 Smoking status: Current Some Day Smoker -- 1.00 packs/day for 53 years    Types: Cigarettes    Last Attempt  to Quit: 11/06/2013  . Smokeless tobacco: Never Used     Comment: almost stopped  . Alcohol Use: No  . Drug Use: No  . Sexual Activity: Not on file   Other Topics Concern  . Not on file   Social History Narrative   Widowed 1999 then 2nd Marriage--2000. Widowed again 2009   Living with daughter now   Has  living will   Daughter [name] is health care POA.   Would accept rescitation but no prolonged artificial ventilation   No feeding tube if cognitively unaware   Review of Systems Right foot ulcer is better Appetite is okay Sleeping well    Objective:   Physical Exam  Constitutional: She appears well-developed. No distress.  Neck: Normal range of motion. Neck supple. No JVD present.  Cardiovascular: Normal rate, regular rhythm, normal heart sounds and intact distal pulses.  Exam reveals no gallop.   No murmur heard. Pulmonary/Chest: Effort normal and breath sounds normal. No respiratory distress. She has no wheezes. She has no rales.  Abdominal: Soft. There is no tenderness.  Musculoskeletal: She exhibits no edema.  No calf tenderness  Lymphadenopathy:    She has no cervical adenopathy.  Skin:  Abnormal right foot is the same---but no ulcer now  Psychiatric: She has a normal mood and affect. Her behavior is normal.          Assessment & Plan:

## 2015-05-28 ENCOUNTER — Other Ambulatory Visit: Payer: Self-pay | Admitting: *Deleted

## 2015-05-28 DIAGNOSIS — M109 Gout, unspecified: Secondary | ICD-10-CM | POA: Diagnosis not present

## 2015-05-28 DIAGNOSIS — S72332D Displaced oblique fracture of shaft of left femur, subsequent encounter for closed fracture with routine healing: Secondary | ICD-10-CM | POA: Diagnosis not present

## 2015-05-28 DIAGNOSIS — J449 Chronic obstructive pulmonary disease, unspecified: Secondary | ICD-10-CM | POA: Diagnosis not present

## 2015-05-28 DIAGNOSIS — E1121 Type 2 diabetes mellitus with diabetic nephropathy: Secondary | ICD-10-CM | POA: Diagnosis not present

## 2015-05-28 DIAGNOSIS — G35 Multiple sclerosis: Secondary | ICD-10-CM | POA: Diagnosis not present

## 2015-05-28 DIAGNOSIS — A5216 Charcot's arthropathy (tabetic): Secondary | ICD-10-CM | POA: Diagnosis not present

## 2015-05-28 MED ORDER — INSULIN PEN NEEDLE 30G X 5 MM MISC: Status: DC

## 2015-05-28 NOTE — Telephone Encounter (Addendum)
Margaretha Sheffield case mgr left v/m returning call about pt after home visit on 05/25/15 for update; Margaretha Sheffield wants to know if there are any discharge orders that [facility] received; request medication reconciliation to see what meds pt was discharged home on and did home health get started; also request copy of office notes to be Faxed to # [phone] with Cover letter to [employer].

## 2015-05-29 DIAGNOSIS — M109 Gout, unspecified: Secondary | ICD-10-CM | POA: Diagnosis not present

## 2015-05-29 DIAGNOSIS — J449 Chronic obstructive pulmonary disease, unspecified: Secondary | ICD-10-CM | POA: Diagnosis not present

## 2015-05-29 DIAGNOSIS — G35 Multiple sclerosis: Secondary | ICD-10-CM | POA: Diagnosis not present

## 2015-05-29 DIAGNOSIS — T148 Other injury of unspecified body region: Secondary | ICD-10-CM | POA: Diagnosis not present

## 2015-05-29 DIAGNOSIS — M79652 Pain in left thigh: Secondary | ICD-10-CM | POA: Diagnosis not present

## 2015-05-29 DIAGNOSIS — E1121 Type 2 diabetes mellitus with diabetic nephropathy: Secondary | ICD-10-CM | POA: Diagnosis not present

## 2015-05-29 DIAGNOSIS — S72332D Displaced oblique fracture of shaft of left femur, subsequent encounter for closed fracture with routine healing: Secondary | ICD-10-CM | POA: Diagnosis not present

## 2015-05-29 DIAGNOSIS — S72402D Unspecified fracture of lower end of left femur, subsequent encounter for closed fracture with routine healing: Secondary | ICD-10-CM | POA: Diagnosis not present

## 2015-05-29 DIAGNOSIS — A5216 Charcot's arthropathy (tabetic): Secondary | ICD-10-CM | POA: Diagnosis not present

## 2015-05-29 NOTE — Telephone Encounter (Signed)
Ok to give OV notes?

## 2015-05-30 NOTE — Telephone Encounter (Signed)
OV notes faxed  

## 2015-05-30 NOTE — Telephone Encounter (Signed)
Please let them know I got nothing from [facility] since she had been sent to rehab. I have done a med reconciliation at my visit so her current list is accurate Okay to send a copy of my note but make sure they get a copy of the current med list---I made some changes at the visit

## 2015-05-31 ENCOUNTER — Encounter: Payer: Self-pay | Admitting: Pain Medicine

## 2015-05-31 ENCOUNTER — Ambulatory Visit: Payer: Medicare Other | Attending: Pain Medicine | Admitting: Pain Medicine

## 2015-05-31 VITALS — BP 128/50 | HR 70 | Temp 98.0°F | Resp 16 | Ht 62.0 in | Wt 269.8 lb

## 2015-05-31 DIAGNOSIS — M791 Myalgia: Secondary | ICD-10-CM | POA: Diagnosis not present

## 2015-05-31 DIAGNOSIS — M109 Gout, unspecified: Secondary | ICD-10-CM | POA: Diagnosis not present

## 2015-05-31 DIAGNOSIS — G35 Multiple sclerosis: Secondary | ICD-10-CM | POA: Diagnosis not present

## 2015-05-31 DIAGNOSIS — M47817 Spondylosis without myelopathy or radiculopathy, lumbosacral region: Secondary | ICD-10-CM | POA: Diagnosis not present

## 2015-05-31 DIAGNOSIS — M25511 Pain in right shoulder: Secondary | ICD-10-CM | POA: Diagnosis present

## 2015-05-31 DIAGNOSIS — E1340 Other specified diabetes mellitus with diabetic neuropathy, unspecified: Secondary | ICD-10-CM

## 2015-05-31 DIAGNOSIS — M47816 Spondylosis without myelopathy or radiculopathy, lumbar region: Secondary | ICD-10-CM | POA: Insufficient documentation

## 2015-05-31 DIAGNOSIS — E1121 Type 2 diabetes mellitus with diabetic nephropathy: Secondary | ICD-10-CM | POA: Diagnosis not present

## 2015-05-31 DIAGNOSIS — E1140 Type 2 diabetes mellitus with diabetic neuropathy, unspecified: Secondary | ICD-10-CM | POA: Insufficient documentation

## 2015-05-31 DIAGNOSIS — M25512 Pain in left shoulder: Secondary | ICD-10-CM | POA: Diagnosis present

## 2015-05-31 DIAGNOSIS — E669 Obesity, unspecified: Secondary | ICD-10-CM | POA: Diagnosis not present

## 2015-05-31 DIAGNOSIS — M5416 Radiculopathy, lumbar region: Secondary | ICD-10-CM | POA: Diagnosis not present

## 2015-05-31 DIAGNOSIS — M461 Sacroiliitis, not elsewhere classified: Secondary | ICD-10-CM | POA: Diagnosis not present

## 2015-05-31 DIAGNOSIS — M19019 Primary osteoarthritis, unspecified shoulder: Secondary | ICD-10-CM | POA: Insufficient documentation

## 2015-05-31 DIAGNOSIS — M542 Cervicalgia: Secondary | ICD-10-CM | POA: Diagnosis present

## 2015-05-31 DIAGNOSIS — M5136 Other intervertebral disc degeneration, lumbar region: Secondary | ICD-10-CM | POA: Diagnosis not present

## 2015-05-31 DIAGNOSIS — A5216 Charcot's arthropathy (tabetic): Secondary | ICD-10-CM | POA: Diagnosis not present

## 2015-05-31 DIAGNOSIS — J449 Chronic obstructive pulmonary disease, unspecified: Secondary | ICD-10-CM | POA: Diagnosis not present

## 2015-05-31 DIAGNOSIS — S72332D Displaced oblique fracture of shaft of left femur, subsequent encounter for closed fracture with routine healing: Secondary | ICD-10-CM | POA: Diagnosis not present

## 2015-05-31 MED ORDER — OXYCODONE HCL 5 MG PO TABS: ORAL_TABLET | ORAL | Status: DC

## 2015-05-31 MED ORDER — GABAPENTIN 300 MG PO CAPS: ORAL_CAPSULE | ORAL | Status: DC

## 2015-05-31 NOTE — Patient Instructions (Addendum)
Continue present medications Neurontin and oxycodone  F/U PCP Dr Silvio Pate for evaliation of  BP, fracture and general medical  condition.  F/U surgical evaluation of fracture with Dr. Dorothyann Peng as planned  F/U neurological evaluation  F/U wound care clinic evaluation  May consider radiofrequency rhizolysis or intraspinal procedures pending response to present treatment and F/U evaluation.  Patient to call [facility] should patient have concerns prior to scheduled return appointment.

## 2015-05-31 NOTE — Progress Notes (Signed)
Safety precautions to be maintained throughout the outpatient stay will include: orient to surroundings, keep bed in low position, maintain call bell within reach at all times, provide assistance with transfer out of bed and ambulation.  

## 2015-05-31 NOTE — Progress Notes (Signed)
   Subjective:    Patient ID: Madeline [name], female    DOB: 1951-08-09, 64 y.o.   MRN: [id number]  HPI Patient is 64 year old female who returns to [facility] for further evaluation and treatment of pain involving the neck and shoulders headache as well as entire back upper and lower extremity regions. Patient recently fell and shower sustaining fracture of the lower extremity in the region of the knee with comminuted, oblique intra-articular fracture involving the mid and distal left femur. Patient underwent surgical intervention [facility] with surgery performed by Dr. Dorothyann Peng. At the present time we will continue present medications. Patient will undergo follow-up evaluation with Dr. Dorothyann Peng as well as with Dr. Silvio Pate and we will remain available is to modify treatment regimen as needed. The patient was understanding and in agreement status treatment plan.   Review of Systems     Objective:   Physical Exam There was tends to palpation of the splenius capitis and occipitalis musculature region palpation of which reproduced mild to moderate discomfort. There were no new lesions of the head and neck noted. Palpation over the cervical facet cervical paraspinal musculature region was a tends to palpation of moderate degree. There was moderate tenderness and limited range of motion of the acromioclavicular glenohumeral joint region with slightly decreased grip strength. Tinel and Phalen's maneuver were without increase of pain significant degree. Patient was is no crepitus of the thoracic region noted there was tends to palpation over the thoracic region of moderate degree. Palpation over the lumbar paraspinal musculature region lumbar facet region was a tends to palpation of moderate degree. There was moderate tenderness of the greater trochanteric region iliotibial band region. Patient was with tenderness to palpation in the region of the left lower extremity in the  region of the knee with bandage in place of recent surgical site for repair of fracture of the left lower extremity. There was decreased EHL strength on the left compared to the right there was negative Homans. Abdomen nontender with no costovertebral maintenance noted.       Assessment & Plan:    Degenerative disc disease lumbar spine Degenerative changes lumbar spine with multilevel involvement with L4-L5 level most significantly involved Lumbar facet syndrome  Fracture of left lower extremity(recent) Comminuted, oblique intra-articular fracture involving the mid and distal left femur Patient is status post surgical intervention for fracture performed at [facility] of orthopedic surgery  Diabetic neuropathy  Obesity  Degenerative joint disease of shoulder   Plan Continue present medications Neurontin and oxycodone  F/U PCP Dr. Silvio Pate  for evaliation of  BP and general medical  condition.  F/U surgical evaluation Patient will follow-up with Dr. Dorothyann Peng status post surgical repair of fracture of the left lower extremity  F/U neurological evaluation  F/U wound care clinic evaluation  May consider radiofrequency rhizolysis or intraspinal procedures pending response to present treatment and F/U evaluation.  Patient to call [facility] should patient have concerns prior to scheduled return appointment.

## 2015-06-01 DIAGNOSIS — E139 Other specified diabetes mellitus without complications: Secondary | ICD-10-CM | POA: Diagnosis not present

## 2015-06-01 DIAGNOSIS — R269 Unspecified abnormalities of gait and mobility: Secondary | ICD-10-CM | POA: Diagnosis not present

## 2015-06-01 DIAGNOSIS — G35 Multiple sclerosis: Secondary | ICD-10-CM | POA: Diagnosis not present

## 2015-06-01 NOTE — Telephone Encounter (Signed)
Sherry from [phone]) called and stated that the daughter dropped off two prescriptions and it was from Dr Catha Gosselin office  [phone] for 60 tablets Of oxycodone dated the 19th of July and the other prescription was from Dr Ethel Rana office for oxycodone dated on the July 21 for 120 tablets.  April Hall, [place]  From Dr Catha Gosselin office Confirmed that Dr Dorothyann Peng wrote the prescription and is aware that the patient comes to this clinic and gets medication from Dr Primus Bravo. Dr Primus Bravo was called and is aware of the situation and knows that Dr Dorothyann Peng wrote the prescription for oxycodone  And is okay for [facility] to fill the prescription from Dr Dorothyann Peng today and fill Dr Primus Bravo prescription on July 26th. Dr Primus Bravo called and aware that  Her prescription will be filled on the 26th.  Date on the medication refill  Was changed to read meds tlu 07/05/2015.

## 2015-06-04 ENCOUNTER — Encounter: Payer: Self-pay | Admitting: Internal Medicine

## 2015-06-04 ENCOUNTER — Other Ambulatory Visit: Payer: Self-pay | Admitting: *Deleted

## 2015-06-04 DIAGNOSIS — M109 Gout, unspecified: Secondary | ICD-10-CM | POA: Diagnosis not present

## 2015-06-04 DIAGNOSIS — J449 Chronic obstructive pulmonary disease, unspecified: Secondary | ICD-10-CM | POA: Diagnosis not present

## 2015-06-04 DIAGNOSIS — A5216 Charcot's arthropathy (tabetic): Secondary | ICD-10-CM | POA: Diagnosis not present

## 2015-06-04 DIAGNOSIS — S72332D Displaced oblique fracture of shaft of left femur, subsequent encounter for closed fracture with routine healing: Secondary | ICD-10-CM | POA: Diagnosis not present

## 2015-06-04 DIAGNOSIS — E1121 Type 2 diabetes mellitus with diabetic nephropathy: Secondary | ICD-10-CM | POA: Diagnosis not present

## 2015-06-04 DIAGNOSIS — G35 Multiple sclerosis: Secondary | ICD-10-CM | POA: Diagnosis not present

## 2015-06-04 MED ORDER — ONETOUCH DELICA LANCETS 33G MISC: Status: AC

## 2015-06-04 NOTE — Telephone Encounter (Signed)
LETVAK PATIENT, Please send back to me for call in  

## 2015-06-04 NOTE — Telephone Encounter (Signed)
Spoke with daughter and advised she couldn't just drop off urine without pt being seen, daughter also states that pt has a discharge and that's another reason pt would need to be seen. I verbally spoke with Webb Silversmith, NP and she advised that pt's home health nurse can check urine but order would have to come thru the ortho office because they are the ordering physician.

## 2015-06-04 NOTE — Telephone Encounter (Signed)
pts daughter, [name] [phone] said she cannot bring pt to [facility] due to her recent surgery on leg and pt basically staying in bed. pts daughter in law is RN and advised pt is having frequency of urine, pressure and burning upon urination and yellowish vaginal discharge. No fever and no confusion. [name] wants to know if can bring urine specimen to office on 06/05/15. Please advise. [name] request cb.

## 2015-06-04 NOTE — Telephone Encounter (Signed)
She would have to be seen   Per [facility]

## 2015-06-04 NOTE — Telephone Encounter (Signed)
Ok to ref #60, 0 ref 

## 2015-06-05 DIAGNOSIS — G35 Multiple sclerosis: Secondary | ICD-10-CM | POA: Diagnosis not present

## 2015-06-05 DIAGNOSIS — A5216 Charcot's arthropathy (tabetic): Secondary | ICD-10-CM | POA: Diagnosis not present

## 2015-06-05 DIAGNOSIS — M109 Gout, unspecified: Secondary | ICD-10-CM | POA: Diagnosis not present

## 2015-06-05 DIAGNOSIS — J449 Chronic obstructive pulmonary disease, unspecified: Secondary | ICD-10-CM | POA: Diagnosis not present

## 2015-06-05 DIAGNOSIS — S72332D Displaced oblique fracture of shaft of left femur, subsequent encounter for closed fracture with routine healing: Secondary | ICD-10-CM | POA: Diagnosis not present

## 2015-06-05 DIAGNOSIS — E1121 Type 2 diabetes mellitus with diabetic nephropathy: Secondary | ICD-10-CM | POA: Diagnosis not present

## 2015-06-05 MED ORDER — DIAZEPAM 5 MG PO TABS: 5.0000 mg | ORAL_TABLET | Freq: Two times a day (BID) | ORAL | Status: DC

## 2015-06-05 NOTE — Telephone Encounter (Signed)
rx called into pharmacy

## 2015-06-05 NOTE — Telephone Encounter (Signed)
Advised daughter I would forward to Dr. Silvio Pate to view on his return.

## 2015-06-06 DIAGNOSIS — E1121 Type 2 diabetes mellitus with diabetic nephropathy: Secondary | ICD-10-CM | POA: Diagnosis not present

## 2015-06-06 DIAGNOSIS — G35 Multiple sclerosis: Secondary | ICD-10-CM | POA: Diagnosis not present

## 2015-06-06 DIAGNOSIS — J449 Chronic obstructive pulmonary disease, unspecified: Secondary | ICD-10-CM | POA: Diagnosis not present

## 2015-06-06 DIAGNOSIS — A5216 Charcot's arthropathy (tabetic): Secondary | ICD-10-CM | POA: Diagnosis not present

## 2015-06-06 DIAGNOSIS — S72332D Displaced oblique fracture of shaft of left femur, subsequent encounter for closed fracture with routine healing: Secondary | ICD-10-CM | POA: Diagnosis not present

## 2015-06-06 DIAGNOSIS — M109 Gout, unspecified: Secondary | ICD-10-CM | POA: Diagnosis not present

## 2015-06-06 MED ORDER — FLUCONAZOLE 150 MG PO TABS: 150.0000 mg | ORAL_TABLET | Freq: Once | ORAL | Status: DC

## 2015-06-06 NOTE — Addendum Note (Signed)
Addended by: Viviana Simpler I on: 06/06/2015 08:11 AM   Modules accepted: Orders

## 2015-06-07 DIAGNOSIS — A5216 Charcot's arthropathy (tabetic): Secondary | ICD-10-CM | POA: Diagnosis not present

## 2015-06-07 DIAGNOSIS — J449 Chronic obstructive pulmonary disease, unspecified: Secondary | ICD-10-CM | POA: Diagnosis not present

## 2015-06-07 DIAGNOSIS — S72332D Displaced oblique fracture of shaft of left femur, subsequent encounter for closed fracture with routine healing: Secondary | ICD-10-CM | POA: Diagnosis not present

## 2015-06-07 DIAGNOSIS — G35 Multiple sclerosis: Secondary | ICD-10-CM | POA: Diagnosis not present

## 2015-06-07 DIAGNOSIS — E1121 Type 2 diabetes mellitus with diabetic nephropathy: Secondary | ICD-10-CM | POA: Diagnosis not present

## 2015-06-07 DIAGNOSIS — M109 Gout, unspecified: Secondary | ICD-10-CM | POA: Diagnosis not present

## 2015-06-08 DIAGNOSIS — M109 Gout, unspecified: Secondary | ICD-10-CM | POA: Diagnosis not present

## 2015-06-08 DIAGNOSIS — A5216 Charcot's arthropathy (tabetic): Secondary | ICD-10-CM | POA: Diagnosis not present

## 2015-06-08 DIAGNOSIS — J449 Chronic obstructive pulmonary disease, unspecified: Secondary | ICD-10-CM | POA: Diagnosis not present

## 2015-06-08 DIAGNOSIS — G35 Multiple sclerosis: Secondary | ICD-10-CM | POA: Diagnosis not present

## 2015-06-08 DIAGNOSIS — S72332D Displaced oblique fracture of shaft of left femur, subsequent encounter for closed fracture with routine healing: Secondary | ICD-10-CM | POA: Diagnosis not present

## 2015-06-08 DIAGNOSIS — E1121 Type 2 diabetes mellitus with diabetic nephropathy: Secondary | ICD-10-CM | POA: Diagnosis not present

## 2015-06-11 DIAGNOSIS — J449 Chronic obstructive pulmonary disease, unspecified: Secondary | ICD-10-CM | POA: Diagnosis not present

## 2015-06-11 DIAGNOSIS — G35 Multiple sclerosis: Secondary | ICD-10-CM | POA: Diagnosis not present

## 2015-06-11 DIAGNOSIS — A5216 Charcot's arthropathy (tabetic): Secondary | ICD-10-CM | POA: Diagnosis not present

## 2015-06-11 DIAGNOSIS — S72332D Displaced oblique fracture of shaft of left femur, subsequent encounter for closed fracture with routine healing: Secondary | ICD-10-CM | POA: Diagnosis not present

## 2015-06-11 DIAGNOSIS — M109 Gout, unspecified: Secondary | ICD-10-CM | POA: Diagnosis not present

## 2015-06-11 DIAGNOSIS — E1121 Type 2 diabetes mellitus with diabetic nephropathy: Secondary | ICD-10-CM | POA: Diagnosis not present

## 2015-06-12 ENCOUNTER — Telehealth: Payer: Self-pay

## 2015-06-12 NOTE — Telephone Encounter (Signed)
Spoke with therapist and advised verbal order, what she was saying is, she's a therapist and she will drop the urine off at their office and have the nurse run the urine.

## 2015-06-12 NOTE — Telephone Encounter (Signed)
Anderson Malta PT with [facility] request verbal order for U/A and C&S; pt having burning upon urination and frequency and pt thinks has fever in evenings; pt presently has home health PT and home health aide; pt does not have nursing home health at this time; does nursing home health need to go back out to pts home? Jennifer request cb.

## 2015-06-12 NOTE — Telephone Encounter (Signed)
Okay to send urine  Not sure about question----she needs ongoing RN and aide help as well as PT

## 2015-06-13 DIAGNOSIS — A5216 Charcot's arthropathy (tabetic): Secondary | ICD-10-CM | POA: Diagnosis not present

## 2015-06-13 DIAGNOSIS — E1121 Type 2 diabetes mellitus with diabetic nephropathy: Secondary | ICD-10-CM | POA: Diagnosis not present

## 2015-06-13 DIAGNOSIS — G35 Multiple sclerosis: Secondary | ICD-10-CM | POA: Diagnosis not present

## 2015-06-13 DIAGNOSIS — J449 Chronic obstructive pulmonary disease, unspecified: Secondary | ICD-10-CM | POA: Diagnosis not present

## 2015-06-13 DIAGNOSIS — M109 Gout, unspecified: Secondary | ICD-10-CM | POA: Diagnosis not present

## 2015-06-13 DIAGNOSIS — S72332D Displaced oblique fracture of shaft of left femur, subsequent encounter for closed fracture with routine healing: Secondary | ICD-10-CM | POA: Diagnosis not present

## 2015-06-14 DIAGNOSIS — A5216 Charcot's arthropathy (tabetic): Secondary | ICD-10-CM | POA: Diagnosis not present

## 2015-06-14 DIAGNOSIS — S72332D Displaced oblique fracture of shaft of left femur, subsequent encounter for closed fracture with routine healing: Secondary | ICD-10-CM | POA: Diagnosis not present

## 2015-06-14 DIAGNOSIS — G35 Multiple sclerosis: Secondary | ICD-10-CM | POA: Diagnosis not present

## 2015-06-14 DIAGNOSIS — J449 Chronic obstructive pulmonary disease, unspecified: Secondary | ICD-10-CM | POA: Diagnosis not present

## 2015-06-14 DIAGNOSIS — E1121 Type 2 diabetes mellitus with diabetic nephropathy: Secondary | ICD-10-CM | POA: Diagnosis not present

## 2015-06-14 DIAGNOSIS — M109 Gout, unspecified: Secondary | ICD-10-CM | POA: Diagnosis not present

## 2015-06-15 DIAGNOSIS — G35 Multiple sclerosis: Secondary | ICD-10-CM | POA: Diagnosis not present

## 2015-06-15 NOTE — Telephone Encounter (Signed)
Left message on VM that she has the most current med list.

## 2015-06-15 NOTE — Telephone Encounter (Addendum)
Margaretha Sheffield, RN case mgr with [facility] left v/m requesting cb to make sure the med list that was faxed to [facility] on 05/31/15 is the most updated med list for pt; Margaretha Sheffield also request contact phone # for [facility].

## 2015-06-18 DIAGNOSIS — G35 Multiple sclerosis: Secondary | ICD-10-CM | POA: Diagnosis not present

## 2015-06-18 DIAGNOSIS — A5216 Charcot's arthropathy (tabetic): Secondary | ICD-10-CM | POA: Diagnosis not present

## 2015-06-18 DIAGNOSIS — S72332D Displaced oblique fracture of shaft of left femur, subsequent encounter for closed fracture with routine healing: Secondary | ICD-10-CM | POA: Diagnosis not present

## 2015-06-18 DIAGNOSIS — E1121 Type 2 diabetes mellitus with diabetic nephropathy: Secondary | ICD-10-CM | POA: Diagnosis not present

## 2015-06-18 DIAGNOSIS — J449 Chronic obstructive pulmonary disease, unspecified: Secondary | ICD-10-CM | POA: Diagnosis not present

## 2015-06-18 DIAGNOSIS — M109 Gout, unspecified: Secondary | ICD-10-CM | POA: Diagnosis not present

## 2015-06-20 DIAGNOSIS — G35 Multiple sclerosis: Secondary | ICD-10-CM | POA: Diagnosis not present

## 2015-06-20 DIAGNOSIS — J449 Chronic obstructive pulmonary disease, unspecified: Secondary | ICD-10-CM | POA: Diagnosis not present

## 2015-06-20 DIAGNOSIS — E1121 Type 2 diabetes mellitus with diabetic nephropathy: Secondary | ICD-10-CM | POA: Diagnosis not present

## 2015-06-20 DIAGNOSIS — A5216 Charcot's arthropathy (tabetic): Secondary | ICD-10-CM | POA: Diagnosis not present

## 2015-06-20 DIAGNOSIS — M109 Gout, unspecified: Secondary | ICD-10-CM | POA: Diagnosis not present

## 2015-06-20 DIAGNOSIS — S72332D Displaced oblique fracture of shaft of left femur, subsequent encounter for closed fracture with routine healing: Secondary | ICD-10-CM | POA: Diagnosis not present

## 2015-06-22 DIAGNOSIS — S72332D Displaced oblique fracture of shaft of left femur, subsequent encounter for closed fracture with routine healing: Secondary | ICD-10-CM | POA: Diagnosis not present

## 2015-06-22 DIAGNOSIS — A5216 Charcot's arthropathy (tabetic): Secondary | ICD-10-CM | POA: Diagnosis not present

## 2015-06-22 DIAGNOSIS — E1121 Type 2 diabetes mellitus with diabetic nephropathy: Secondary | ICD-10-CM | POA: Diagnosis not present

## 2015-06-22 DIAGNOSIS — M109 Gout, unspecified: Secondary | ICD-10-CM | POA: Diagnosis not present

## 2015-06-22 DIAGNOSIS — J449 Chronic obstructive pulmonary disease, unspecified: Secondary | ICD-10-CM | POA: Diagnosis not present

## 2015-06-22 DIAGNOSIS — G35 Multiple sclerosis: Secondary | ICD-10-CM | POA: Diagnosis not present

## 2015-06-25 DIAGNOSIS — J449 Chronic obstructive pulmonary disease, unspecified: Secondary | ICD-10-CM | POA: Diagnosis not present

## 2015-06-25 DIAGNOSIS — S72332D Displaced oblique fracture of shaft of left femur, subsequent encounter for closed fracture with routine healing: Secondary | ICD-10-CM | POA: Diagnosis not present

## 2015-06-25 DIAGNOSIS — A5216 Charcot's arthropathy (tabetic): Secondary | ICD-10-CM | POA: Diagnosis not present

## 2015-06-25 DIAGNOSIS — E1121 Type 2 diabetes mellitus with diabetic nephropathy: Secondary | ICD-10-CM | POA: Diagnosis not present

## 2015-06-25 DIAGNOSIS — M109 Gout, unspecified: Secondary | ICD-10-CM | POA: Diagnosis not present

## 2015-06-25 DIAGNOSIS — G35 Multiple sclerosis: Secondary | ICD-10-CM | POA: Diagnosis not present

## 2015-06-26 ENCOUNTER — Inpatient Hospital Stay: Payer: Medicare Other | Attending: Oncology

## 2015-06-27 DIAGNOSIS — E1121 Type 2 diabetes mellitus with diabetic nephropathy: Secondary | ICD-10-CM | POA: Diagnosis not present

## 2015-06-27 DIAGNOSIS — J449 Chronic obstructive pulmonary disease, unspecified: Secondary | ICD-10-CM | POA: Diagnosis not present

## 2015-06-27 DIAGNOSIS — A5216 Charcot's arthropathy (tabetic): Secondary | ICD-10-CM | POA: Diagnosis not present

## 2015-06-27 DIAGNOSIS — M109 Gout, unspecified: Secondary | ICD-10-CM | POA: Diagnosis not present

## 2015-06-27 DIAGNOSIS — G35 Multiple sclerosis: Secondary | ICD-10-CM | POA: Diagnosis not present

## 2015-06-27 DIAGNOSIS — S72332D Displaced oblique fracture of shaft of left femur, subsequent encounter for closed fracture with routine healing: Secondary | ICD-10-CM | POA: Diagnosis not present

## 2015-06-28 DIAGNOSIS — A5216 Charcot's arthropathy (tabetic): Secondary | ICD-10-CM | POA: Diagnosis not present

## 2015-06-28 DIAGNOSIS — E1121 Type 2 diabetes mellitus with diabetic nephropathy: Secondary | ICD-10-CM | POA: Diagnosis not present

## 2015-06-28 DIAGNOSIS — J449 Chronic obstructive pulmonary disease, unspecified: Secondary | ICD-10-CM | POA: Diagnosis not present

## 2015-06-28 DIAGNOSIS — S72332D Displaced oblique fracture of shaft of left femur, subsequent encounter for closed fracture with routine healing: Secondary | ICD-10-CM | POA: Diagnosis not present

## 2015-06-28 DIAGNOSIS — G35 Multiple sclerosis: Secondary | ICD-10-CM | POA: Diagnosis not present

## 2015-06-28 DIAGNOSIS — M109 Gout, unspecified: Secondary | ICD-10-CM | POA: Diagnosis not present

## 2015-06-29 DIAGNOSIS — A5216 Charcot's arthropathy (tabetic): Secondary | ICD-10-CM | POA: Diagnosis not present

## 2015-06-29 DIAGNOSIS — J449 Chronic obstructive pulmonary disease, unspecified: Secondary | ICD-10-CM | POA: Diagnosis not present

## 2015-06-29 DIAGNOSIS — M109 Gout, unspecified: Secondary | ICD-10-CM | POA: Diagnosis not present

## 2015-06-29 DIAGNOSIS — S72332D Displaced oblique fracture of shaft of left femur, subsequent encounter for closed fracture with routine healing: Secondary | ICD-10-CM | POA: Diagnosis not present

## 2015-06-29 DIAGNOSIS — E1121 Type 2 diabetes mellitus with diabetic nephropathy: Secondary | ICD-10-CM | POA: Diagnosis not present

## 2015-06-29 DIAGNOSIS — G35 Multiple sclerosis: Secondary | ICD-10-CM | POA: Diagnosis not present

## 2015-07-02 DIAGNOSIS — J449 Chronic obstructive pulmonary disease, unspecified: Secondary | ICD-10-CM | POA: Diagnosis not present

## 2015-07-02 DIAGNOSIS — G35 Multiple sclerosis: Secondary | ICD-10-CM | POA: Diagnosis not present

## 2015-07-02 DIAGNOSIS — A5216 Charcot's arthropathy (tabetic): Secondary | ICD-10-CM | POA: Diagnosis not present

## 2015-07-02 DIAGNOSIS — S72332D Displaced oblique fracture of shaft of left femur, subsequent encounter for closed fracture with routine healing: Secondary | ICD-10-CM | POA: Diagnosis not present

## 2015-07-02 DIAGNOSIS — E1121 Type 2 diabetes mellitus with diabetic nephropathy: Secondary | ICD-10-CM | POA: Diagnosis not present

## 2015-07-02 DIAGNOSIS — M109 Gout, unspecified: Secondary | ICD-10-CM | POA: Diagnosis not present

## 2015-07-03 ENCOUNTER — Ambulatory Visit: Payer: Medicare Other | Attending: Pain Medicine | Admitting: Pain Medicine

## 2015-07-03 ENCOUNTER — Encounter: Payer: Self-pay | Admitting: Pain Medicine

## 2015-07-03 VITALS — BP 134/83 | HR 74 | Temp 97.3°F | Resp 18 | Ht 63.0 in | Wt 269.0 lb

## 2015-07-03 DIAGNOSIS — Z9889 Other specified postprocedural states: Secondary | ICD-10-CM | POA: Insufficient documentation

## 2015-07-03 DIAGNOSIS — M5136 Other intervertebral disc degeneration, lumbar region: Secondary | ICD-10-CM

## 2015-07-03 DIAGNOSIS — E1340 Other specified diabetes mellitus with diabetic neuropathy, unspecified: Secondary | ICD-10-CM

## 2015-07-03 DIAGNOSIS — S72402A Unspecified fracture of lower end of left femur, initial encounter for closed fracture: Secondary | ICD-10-CM | POA: Insufficient documentation

## 2015-07-03 DIAGNOSIS — X58XXXA Exposure to other specified factors, initial encounter: Secondary | ICD-10-CM | POA: Insufficient documentation

## 2015-07-03 DIAGNOSIS — E1140 Type 2 diabetes mellitus with diabetic neuropathy, unspecified: Secondary | ICD-10-CM | POA: Insufficient documentation

## 2015-07-03 DIAGNOSIS — M19011 Primary osteoarthritis, right shoulder: Secondary | ICD-10-CM | POA: Diagnosis not present

## 2015-07-03 DIAGNOSIS — M546 Pain in thoracic spine: Secondary | ICD-10-CM | POA: Diagnosis present

## 2015-07-03 DIAGNOSIS — S7292XD Unspecified fracture of left femur, subsequent encounter for closed fracture with routine healing: Secondary | ICD-10-CM

## 2015-07-03 DIAGNOSIS — M791 Myalgia: Secondary | ICD-10-CM | POA: Diagnosis not present

## 2015-07-03 DIAGNOSIS — M47817 Spondylosis without myelopathy or radiculopathy, lumbosacral region: Secondary | ICD-10-CM | POA: Diagnosis not present

## 2015-07-03 DIAGNOSIS — E669 Obesity, unspecified: Secondary | ICD-10-CM | POA: Insufficient documentation

## 2015-07-03 DIAGNOSIS — M47816 Spondylosis without myelopathy or radiculopathy, lumbar region: Secondary | ICD-10-CM | POA: Insufficient documentation

## 2015-07-03 DIAGNOSIS — M5416 Radiculopathy, lumbar region: Secondary | ICD-10-CM | POA: Diagnosis not present

## 2015-07-03 DIAGNOSIS — M19012 Primary osteoarthritis, left shoulder: Secondary | ICD-10-CM | POA: Diagnosis not present

## 2015-07-03 DIAGNOSIS — M542 Cervicalgia: Secondary | ICD-10-CM | POA: Diagnosis present

## 2015-07-03 DIAGNOSIS — M461 Sacroiliitis, not elsewhere classified: Secondary | ICD-10-CM | POA: Diagnosis not present

## 2015-07-03 MED ORDER — OXYCODONE HCL 5 MG PO TABS: ORAL_TABLET | ORAL | Status: DC

## 2015-07-03 MED ORDER — GABAPENTIN 300 MG PO CAPS: ORAL_CAPSULE | ORAL | Status: DC

## 2015-07-03 NOTE — Progress Notes (Signed)
Safety precautions to be maintained throughout the outpatient stay will include: orient to surroundings, keep bed in low position, maintain call bell within reach at all times, provide assistance with transfer out of bed and ambulation.  

## 2015-07-03 NOTE — Patient Instructions (Signed)
Continue present medications Neurontin and oxycodone  F/U PCP  Dr. Silvio Pate for evaliation of  BP and general medical  condition  F/U surgical evaluation  F/U neurological evaluation  F/U wound care clinic evaluation  May consider radiofrequency rhizolysis or intraspinal procedures pending response to present treatment and F/U evaluation   Patient to call [facility] should patient have concerns prior to scheduled return appointmen.

## 2015-07-03 NOTE — Progress Notes (Signed)
   Subjective:    Patient ID: Madeline [name], female    DOB: December 05, 1950, 64 y.o.   MRN: [id number]  HPI   patient 64 year old female returns to [place] for further evaluation and treatment of pain involving the neck shoulder entire back upper and lower extremity region and the left lower extremity status post fracture of the left lower extremity. On today's visit patient was with pain fairly well-controlled. Patient appears to be tolerating medications well. Patient is without ulcerations of the lower extremities. Patient is with history of ulcerations of the lower extremities and as well as diabetes mellitus. Patient is status post recent fall with fracture of the right lower extremity with significant improvement and appears to be convalescing rather well. We will continue presently prescribed medications and avoid interventional treatment. Patient will continue Neurontin and oxycodone and will call [facility] should there be significant change in her condition prior to scheduled return appointment. The patient was understanding and agree with suggested treatment plan.  Review of Systems     Objective:   Physical Exam   there was tends to palpation splenius capitis and occipitalis musculature region of mild degree. There was moderate tends to palpation of the acromioclavicular glenohumeral joint region right greater than the left. Period there was limited range of motion of the shoulder. Patient appeared to be with unremarkable Spurling's maneuver. Tinel and Phalen's maneuver were without increased pain of significant degree There was tends to palpation over the cervical facet cervical paraspinal musculature region as well as the thoracic facet and thoracic paraspinal musculature regions. No crepitus of the thoracic region was noted. There was well-healed surgical scar of the left lower extremity in the region of the knee. No increased warmth or erythema in the region of  the lower extremity was noted. No lower extremity edema was noted. No new lesions of the lower extremity were noted. EHL strength was decreased there was negative  Homans. There was mild tenderness of the greater trochanteric region and iliotibial band region.. The abdomen was nontender and no costovertebral tenderness was noted.      Assessment & Plan:    Degenerative disc disease lumbar spine Degenerative changes lumbar spine with multilevel involvement with L4-L5 level most significantly involved Lumbar facet syndrome  Fracture of left lower extremity(recent) Comminuted, oblique intra-articular fracture involving the mid and distal left femur Patient is status post surgical intervention for fracture performed at [facility] Department of orthopedic surgery  Diabetic neuropathy  Obesity  Degenerative joint disease of shoulder     Plan   Continue present medications Neurontin and oxycodone  F/U PCP  Dr.Letvak  for evaliation of  BP and general medical  condition  F/U surgical evaluation status post fracture of the left lower extremity as planned  F/U neurological evaluation  F/U  wound care clinic evaluation as needed  May consider radiofrequency rhizolysis or intraspinal procedures pending response to present treatment and F/U evaluation   Patient to call [facility] should patient have concerns prior to scheduled return appointmen.

## 2015-07-04 ENCOUNTER — Telehealth: Payer: Self-pay | Admitting: *Deleted

## 2015-07-04 ENCOUNTER — Other Ambulatory Visit: Payer: Self-pay | Admitting: Pain Medicine

## 2015-07-04 DIAGNOSIS — R262 Difficulty in walking, not elsewhere classified: Secondary | ICD-10-CM | POA: Diagnosis not present

## 2015-07-04 DIAGNOSIS — Y33XXXD Other specified events, undetermined intent, subsequent encounter: Secondary | ICD-10-CM | POA: Diagnosis not present

## 2015-07-04 DIAGNOSIS — A5216 Charcot's arthropathy (tabetic): Secondary | ICD-10-CM | POA: Diagnosis not present

## 2015-07-04 DIAGNOSIS — R531 Weakness: Secondary | ICD-10-CM | POA: Diagnosis not present

## 2015-07-04 DIAGNOSIS — S72342D Displaced spiral fracture of shaft of left femur, subsequent encounter for closed fracture with routine healing: Secondary | ICD-10-CM | POA: Diagnosis not present

## 2015-07-04 DIAGNOSIS — J449 Chronic obstructive pulmonary disease, unspecified: Secondary | ICD-10-CM | POA: Diagnosis not present

## 2015-07-04 DIAGNOSIS — S72332D Displaced oblique fracture of shaft of left femur, subsequent encounter for closed fracture with routine healing: Secondary | ICD-10-CM | POA: Diagnosis not present

## 2015-07-04 DIAGNOSIS — M109 Gout, unspecified: Secondary | ICD-10-CM | POA: Diagnosis not present

## 2015-07-04 DIAGNOSIS — G35 Multiple sclerosis: Secondary | ICD-10-CM | POA: Diagnosis not present

## 2015-07-04 DIAGNOSIS — E1121 Type 2 diabetes mellitus with diabetic nephropathy: Secondary | ICD-10-CM | POA: Diagnosis not present

## 2015-07-04 DIAGNOSIS — M79605 Pain in left leg: Secondary | ICD-10-CM | POA: Diagnosis not present

## 2015-07-04 NOTE — Telephone Encounter (Signed)
Dr. Primus Bravo pt's daughter called and stated" Mon's surgeon Dr. Dorothyann Peng gave her oxycodone 5mg  no. 70  To take. q 4 hours prn

## 2015-07-04 NOTE — Telephone Encounter (Signed)
Pt is home bound due to fx femur and needs port flushed, asking if we can send order to [facility] care [phone] for port flush. Spoke with [place] who agreed to  Flush port adn asked an order be faxed to [phone]. Order faxed and they said they would start next week, pt informed

## 2015-07-05 ENCOUNTER — Inpatient Hospital Stay: Payer: Medicare Other

## 2015-07-05 NOTE — Telephone Encounter (Signed)
Nurses I answered this yesterday. The patient may take the oxycodone prescribed by Dr. Dorothyann Peng. Patient is still under the care of surgeon and prefer to have patient prescribed medication for treatment of pain postoperatively to monitor patient's condition. Remind patient that she should avoid taking oxycodone prescribed by me at this time to avoid respiratory depression, confusion, and other undesirable side effects. Please informed patient's daughter, Mrs. Aumen, as well as informed the patient of this note since she is caregiver for her mother. This is same message as was sent yesterday by me

## 2015-07-06 DIAGNOSIS — J449 Chronic obstructive pulmonary disease, unspecified: Secondary | ICD-10-CM | POA: Diagnosis not present

## 2015-07-06 DIAGNOSIS — E1121 Type 2 diabetes mellitus with diabetic nephropathy: Secondary | ICD-10-CM | POA: Diagnosis not present

## 2015-07-06 DIAGNOSIS — A5216 Charcot's arthropathy (tabetic): Secondary | ICD-10-CM | POA: Diagnosis not present

## 2015-07-06 DIAGNOSIS — M109 Gout, unspecified: Secondary | ICD-10-CM | POA: Diagnosis not present

## 2015-07-06 DIAGNOSIS — G35 Multiple sclerosis: Secondary | ICD-10-CM | POA: Diagnosis not present

## 2015-07-06 DIAGNOSIS — S72332D Displaced oblique fracture of shaft of left femur, subsequent encounter for closed fracture with routine healing: Secondary | ICD-10-CM | POA: Diagnosis not present

## 2015-07-08 DIAGNOSIS — J449 Chronic obstructive pulmonary disease, unspecified: Secondary | ICD-10-CM | POA: Diagnosis not present

## 2015-07-09 DIAGNOSIS — A5216 Charcot's arthropathy (tabetic): Secondary | ICD-10-CM | POA: Diagnosis not present

## 2015-07-09 DIAGNOSIS — G35 Multiple sclerosis: Secondary | ICD-10-CM | POA: Diagnosis not present

## 2015-07-09 DIAGNOSIS — S72332D Displaced oblique fracture of shaft of left femur, subsequent encounter for closed fracture with routine healing: Secondary | ICD-10-CM | POA: Diagnosis not present

## 2015-07-09 DIAGNOSIS — J449 Chronic obstructive pulmonary disease, unspecified: Secondary | ICD-10-CM | POA: Diagnosis not present

## 2015-07-09 DIAGNOSIS — E1121 Type 2 diabetes mellitus with diabetic nephropathy: Secondary | ICD-10-CM | POA: Diagnosis not present

## 2015-07-09 DIAGNOSIS — M109 Gout, unspecified: Secondary | ICD-10-CM | POA: Diagnosis not present

## 2015-07-11 DIAGNOSIS — M109 Gout, unspecified: Secondary | ICD-10-CM | POA: Diagnosis not present

## 2015-07-11 DIAGNOSIS — E1121 Type 2 diabetes mellitus with diabetic nephropathy: Secondary | ICD-10-CM | POA: Diagnosis not present

## 2015-07-11 DIAGNOSIS — J449 Chronic obstructive pulmonary disease, unspecified: Secondary | ICD-10-CM | POA: Diagnosis not present

## 2015-07-11 DIAGNOSIS — S72332D Displaced oblique fracture of shaft of left femur, subsequent encounter for closed fracture with routine healing: Secondary | ICD-10-CM | POA: Diagnosis not present

## 2015-07-11 DIAGNOSIS — A5216 Charcot's arthropathy (tabetic): Secondary | ICD-10-CM | POA: Diagnosis not present

## 2015-07-11 DIAGNOSIS — G35 Multiple sclerosis: Secondary | ICD-10-CM | POA: Diagnosis not present

## 2015-07-11 NOTE — Telephone Encounter (Signed)
Sent to Dr. Primus Bravo

## 2015-07-13 DIAGNOSIS — S72332D Displaced oblique fracture of shaft of left femur, subsequent encounter for closed fracture with routine healing: Secondary | ICD-10-CM | POA: Diagnosis not present

## 2015-07-13 DIAGNOSIS — G35 Multiple sclerosis: Secondary | ICD-10-CM | POA: Diagnosis not present

## 2015-07-13 DIAGNOSIS — J449 Chronic obstructive pulmonary disease, unspecified: Secondary | ICD-10-CM | POA: Diagnosis not present

## 2015-07-13 DIAGNOSIS — A5216 Charcot's arthropathy (tabetic): Secondary | ICD-10-CM | POA: Diagnosis not present

## 2015-07-13 DIAGNOSIS — M109 Gout, unspecified: Secondary | ICD-10-CM | POA: Diagnosis not present

## 2015-07-13 DIAGNOSIS — E1121 Type 2 diabetes mellitus with diabetic nephropathy: Secondary | ICD-10-CM | POA: Diagnosis not present

## 2015-07-16 DIAGNOSIS — G35 Multiple sclerosis: Secondary | ICD-10-CM | POA: Diagnosis not present

## 2015-07-17 DIAGNOSIS — J449 Chronic obstructive pulmonary disease, unspecified: Secondary | ICD-10-CM | POA: Diagnosis not present

## 2015-07-17 DIAGNOSIS — G35 Multiple sclerosis: Secondary | ICD-10-CM | POA: Diagnosis not present

## 2015-07-17 DIAGNOSIS — E1121 Type 2 diabetes mellitus with diabetic nephropathy: Secondary | ICD-10-CM | POA: Diagnosis not present

## 2015-07-17 DIAGNOSIS — S72332D Displaced oblique fracture of shaft of left femur, subsequent encounter for closed fracture with routine healing: Secondary | ICD-10-CM | POA: Diagnosis not present

## 2015-07-17 DIAGNOSIS — M109 Gout, unspecified: Secondary | ICD-10-CM | POA: Diagnosis not present

## 2015-07-17 DIAGNOSIS — A5216 Charcot's arthropathy (tabetic): Secondary | ICD-10-CM | POA: Diagnosis not present

## 2015-07-18 DIAGNOSIS — A5216 Charcot's arthropathy (tabetic): Secondary | ICD-10-CM | POA: Diagnosis not present

## 2015-07-18 DIAGNOSIS — M109 Gout, unspecified: Secondary | ICD-10-CM | POA: Diagnosis not present

## 2015-07-18 DIAGNOSIS — S72332D Displaced oblique fracture of shaft of left femur, subsequent encounter for closed fracture with routine healing: Secondary | ICD-10-CM | POA: Diagnosis not present

## 2015-07-18 DIAGNOSIS — J449 Chronic obstructive pulmonary disease, unspecified: Secondary | ICD-10-CM | POA: Diagnosis not present

## 2015-07-18 DIAGNOSIS — G35 Multiple sclerosis: Secondary | ICD-10-CM | POA: Diagnosis not present

## 2015-07-18 DIAGNOSIS — E1121 Type 2 diabetes mellitus with diabetic nephropathy: Secondary | ICD-10-CM | POA: Diagnosis not present

## 2015-07-24 DIAGNOSIS — S72332D Displaced oblique fracture of shaft of left femur, subsequent encounter for closed fracture with routine healing: Secondary | ICD-10-CM | POA: Diagnosis not present

## 2015-07-24 DIAGNOSIS — G35 Multiple sclerosis: Secondary | ICD-10-CM | POA: Diagnosis not present

## 2015-07-24 DIAGNOSIS — A5216 Charcot's arthropathy (tabetic): Secondary | ICD-10-CM | POA: Diagnosis not present

## 2015-07-24 DIAGNOSIS — E1121 Type 2 diabetes mellitus with diabetic nephropathy: Secondary | ICD-10-CM | POA: Diagnosis not present

## 2015-07-24 DIAGNOSIS — J449 Chronic obstructive pulmonary disease, unspecified: Secondary | ICD-10-CM | POA: Diagnosis not present

## 2015-07-24 DIAGNOSIS — M109 Gout, unspecified: Secondary | ICD-10-CM | POA: Diagnosis not present

## 2015-07-25 DIAGNOSIS — A5216 Charcot's arthropathy (tabetic): Secondary | ICD-10-CM | POA: Diagnosis not present

## 2015-07-25 DIAGNOSIS — M109 Gout, unspecified: Secondary | ICD-10-CM | POA: Diagnosis not present

## 2015-07-25 DIAGNOSIS — E1121 Type 2 diabetes mellitus with diabetic nephropathy: Secondary | ICD-10-CM | POA: Diagnosis not present

## 2015-07-25 DIAGNOSIS — J449 Chronic obstructive pulmonary disease, unspecified: Secondary | ICD-10-CM | POA: Diagnosis not present

## 2015-07-25 DIAGNOSIS — S72332D Displaced oblique fracture of shaft of left femur, subsequent encounter for closed fracture with routine healing: Secondary | ICD-10-CM | POA: Diagnosis not present

## 2015-07-25 DIAGNOSIS — G35 Multiple sclerosis: Secondary | ICD-10-CM | POA: Diagnosis not present

## 2015-07-26 DIAGNOSIS — S72332D Displaced oblique fracture of shaft of left femur, subsequent encounter for closed fracture with routine healing: Secondary | ICD-10-CM | POA: Diagnosis not present

## 2015-07-26 DIAGNOSIS — G35 Multiple sclerosis: Secondary | ICD-10-CM | POA: Diagnosis not present

## 2015-07-26 DIAGNOSIS — J449 Chronic obstructive pulmonary disease, unspecified: Secondary | ICD-10-CM | POA: Diagnosis not present

## 2015-07-26 DIAGNOSIS — A5216 Charcot's arthropathy (tabetic): Secondary | ICD-10-CM | POA: Diagnosis not present

## 2015-07-26 DIAGNOSIS — E1121 Type 2 diabetes mellitus with diabetic nephropathy: Secondary | ICD-10-CM | POA: Diagnosis not present

## 2015-07-26 DIAGNOSIS — M109 Gout, unspecified: Secondary | ICD-10-CM | POA: Diagnosis not present

## 2015-07-30 DIAGNOSIS — J449 Chronic obstructive pulmonary disease, unspecified: Secondary | ICD-10-CM | POA: Diagnosis not present

## 2015-07-30 DIAGNOSIS — M109 Gout, unspecified: Secondary | ICD-10-CM | POA: Diagnosis not present

## 2015-07-30 DIAGNOSIS — S72332D Displaced oblique fracture of shaft of left femur, subsequent encounter for closed fracture with routine healing: Secondary | ICD-10-CM | POA: Diagnosis not present

## 2015-07-30 DIAGNOSIS — A5216 Charcot's arthropathy (tabetic): Secondary | ICD-10-CM | POA: Diagnosis not present

## 2015-07-30 DIAGNOSIS — E1121 Type 2 diabetes mellitus with diabetic nephropathy: Secondary | ICD-10-CM | POA: Diagnosis not present

## 2015-07-30 DIAGNOSIS — G35 Multiple sclerosis: Secondary | ICD-10-CM | POA: Diagnosis not present

## 2015-07-31 DIAGNOSIS — G35 Multiple sclerosis: Secondary | ICD-10-CM | POA: Diagnosis not present

## 2015-07-31 DIAGNOSIS — A5216 Charcot's arthropathy (tabetic): Secondary | ICD-10-CM | POA: Diagnosis not present

## 2015-07-31 DIAGNOSIS — S72332D Displaced oblique fracture of shaft of left femur, subsequent encounter for closed fracture with routine healing: Secondary | ICD-10-CM | POA: Diagnosis not present

## 2015-07-31 DIAGNOSIS — M109 Gout, unspecified: Secondary | ICD-10-CM | POA: Diagnosis not present

## 2015-07-31 DIAGNOSIS — J449 Chronic obstructive pulmonary disease, unspecified: Secondary | ICD-10-CM | POA: Diagnosis not present

## 2015-07-31 DIAGNOSIS — E1121 Type 2 diabetes mellitus with diabetic nephropathy: Secondary | ICD-10-CM | POA: Diagnosis not present

## 2015-08-01 ENCOUNTER — Encounter: Payer: Self-pay | Admitting: Internal Medicine

## 2015-08-01 ENCOUNTER — Ambulatory Visit: Payer: Medicare Other | Admitting: Internal Medicine

## 2015-08-01 VITALS — BP 112/62 | HR 66 | Resp 12

## 2015-08-01 DIAGNOSIS — J439 Emphysema, unspecified: Secondary | ICD-10-CM

## 2015-08-01 DIAGNOSIS — M109 Gout, unspecified: Secondary | ICD-10-CM | POA: Diagnosis not present

## 2015-08-01 DIAGNOSIS — J449 Chronic obstructive pulmonary disease, unspecified: Secondary | ICD-10-CM | POA: Diagnosis not present

## 2015-08-01 DIAGNOSIS — S72332D Displaced oblique fracture of shaft of left femur, subsequent encounter for closed fracture with routine healing: Secondary | ICD-10-CM | POA: Diagnosis not present

## 2015-08-01 DIAGNOSIS — Z23 Encounter for immunization: Secondary | ICD-10-CM | POA: Diagnosis not present

## 2015-08-01 DIAGNOSIS — E11610 Type 2 diabetes mellitus with diabetic neuropathic arthropathy: Secondary | ICD-10-CM

## 2015-08-01 DIAGNOSIS — F39 Unspecified mood [affective] disorder: Secondary | ICD-10-CM | POA: Diagnosis not present

## 2015-08-01 DIAGNOSIS — E1140 Type 2 diabetes mellitus with diabetic neuropathy, unspecified: Secondary | ICD-10-CM

## 2015-08-01 DIAGNOSIS — I10 Essential (primary) hypertension: Secondary | ICD-10-CM

## 2015-08-01 DIAGNOSIS — G35 Multiple sclerosis: Secondary | ICD-10-CM

## 2015-08-01 DIAGNOSIS — E1121 Type 2 diabetes mellitus with diabetic nephropathy: Secondary | ICD-10-CM | POA: Diagnosis not present

## 2015-08-01 DIAGNOSIS — S7292XG Unspecified fracture of left femur, subsequent encounter for closed fracture with delayed healing: Secondary | ICD-10-CM

## 2015-08-01 DIAGNOSIS — A5216 Charcot's arthropathy (tabetic): Secondary | ICD-10-CM | POA: Diagnosis not present

## 2015-08-01 NOTE — Assessment & Plan Note (Signed)
Seems to have good control Lab Results  Component Value Date   HGBA1C 7.1* 04/26/2015   Will recheck when able to get out to office again

## 2015-08-01 NOTE — Assessment & Plan Note (Signed)
Supposed to limit weight bearing on right foot due to Charcot joint---but now trouble with left Working with PT now and will continue once she can start standing again

## 2015-08-01 NOTE — Assessment & Plan Note (Signed)
Has some SOB but I think it is mostly deconditioning--no exacerbation

## 2015-08-01 NOTE — Assessment & Plan Note (Signed)
Difficult situation stuck in bed in daughter's small home No exacerbation though

## 2015-08-01 NOTE — Progress Notes (Signed)
Subjective:    Patient ID: Madeline [name], female    DOB: [birth year], 64 y.o.   MRN: [id number]  HPI Home visit for follow up of femur fracture and her chronic medical conditions Daughter is here  Still not allowed to bear weight on the leg Can transfer to commode though--but barely The lovenox was stopped--on full ASA now Still with severe pain--- but trying to limit exercise Still with therapy at home--- three times a week Still not sure how long till she can start weight bearing--goes back 9//28  Has been out to Dr Primus Bravo No recent injections--not since her injury  Breathing has been bad with early ragweed (or mold) Some wheezing and SOB Still smoking--has tried to cut down Briefly tried a patch but didn't stick with it  Some left chest pain and down her arm Thinks it may be from the exercise--and pulling herself with transfers Some dizziness if tired--- no syncope. Not new with her MS No edema since in bed most of the time  Mood is okay Frustrated with situation Knows it is a strain on her daughter--but things seem to be okay  Checks sugars bid usually Fasting usually under 130 randoms up to 200 or so Hasn't really needed the short acting insulin  Current Outpatient Prescriptions on File Prior to Visit  Medication Sig Dispense Refill  . albuterol (PROVENTIL) (2.5 MG/3ML) 0.083% nebulizer solution Take 3 mLs (2.5 mg total) by nebulization every 6 (six) hours as needed for wheezing or shortness of breath. 75 mL 11  . allopurinol (ZYLOPRIM) 300 MG tablet Take 1 tablet (300 mg total) by mouth daily. 90 tablet 3  . aspirin 325 MG tablet Take 325 mg by mouth daily.      . baclofen (LIORESAL) 10 MG tablet Limit 1 tablet by mouth twice a day to 3 times a day if tolerated (Patient taking differently: 10 mg 2 (two) times daily. Third one prn) 80 each 0  . budesonide-formoterol (SYMBICORT) 160-4.5 MCG/ACT inhaler Inhale 1 puff into the lungs 2 (two) times daily. 10.2 g 5  .  Calcium Carbonate-Vitamin D (CALCIUM-VITAMIN D) 600-200 MG-UNIT CAPS Take by mouth 2 (two) times daily.     . carvedilol (COREG) 25 MG tablet Take 1 tablet (25 mg total) by mouth 2 (two) times daily with a meal. 180 tablet 3  . colchicine 0.6 MG tablet Take 1 tablet (0.6 mg total) by mouth 2 (two) times daily as needed. (Patient taking differently: Take 0.6 mg by mouth 2 (two) times daily. ) 60 tablet 11  . COPAXONE 40 MG/ML SOSY Inject 49ml (40mg ) subcutaneously three times a week. 36 Syringe 3  . diazepam (VALIUM) 5 MG tablet Take 1 tablet (5 mg total) by mouth 2 (two) times daily. 60 tablet 0  . fish oil-omega-3 fatty acids 1000 MG capsule Take 2 g by mouth daily.      Marland Kitchen FLUoxetine (PROZAC) 20 MG capsule TAKE 2 CAPSULES BY MOUTH EVERY DAY 180 capsule 3  . furosemide (LASIX) 80 MG tablet Take 1 tablet (80 mg total) by mouth daily. Take second dose around lunchtime if your legs are swelling 180 tablet 3  . gabapentin (NEURONTIN) 300 MG capsule Limit 1-2 tablets by mouth 2-4 times per day if tolerated (Patient taking differently: Take 600 mg by mouth 4 (four) times daily. ) 240 capsule 0  . glucose blood (ONE TOUCH TEST STRIPS) test strip Use as instructed to test blood sugar 3 times daily dx: E11.40 300 each  3  . insulin lispro (HUMALOG) 100 UNIT/ML KiwkPen Use according to sliding scale as directed 15 mL 3  . Insulin Pen Needle 30G X 5 MM MISC Use as instructed to inject insulin 2-3 times daily dx 250.60 300 each 6  . LANTUS SOLOSTAR 100 UNIT/ML Solostar Pen Inject 45 Units into the skin 2 (two) times daily.     Marland Kitchen levothyroxine (SYNTHROID, LEVOTHROID) 175 MCG tablet Take 1 tablet (175 mcg total) by mouth daily. 90 tablet 3  . meloxicam (MOBIC) 15 MG tablet Take 1 tablet (15 mg total) by mouth daily as needed. (Patient taking differently: Take 15 mg by mouth daily. ) 90 tablet 3  . metFORMIN (GLUCOPHAGE) 1000 MG tablet Take 1,000 mg by mouth daily with breakfast.     . omeprazole (PRILOSEC) 20 MG  capsule TAKE ONE CAPSULE BY MOUTH TWICE DAILY 180 capsule 3  . ONETOUCH DELICA LANCETS 35D MISC Use to test blood sugar three times daily dx: E11.40 300 each 0  . oxyCODONE (OXY IR/ROXICODONE) 5 MG immediate release tablet Limit 1 tablet by mouth 2-4 times per day if tolerated 120 tablet 0  . potassium chloride (KLOR-CON M10) 10 MEQ tablet TAKE 2 TABLETS BY MOUTH EVERY DAY 180 tablet 3  . tiotropium (SPIRIVA HANDIHALER) 18 MCG inhalation capsule PLACE 1 CAPSULE (18 MCG TOTAL) INTO INHALER AND INHALE DAILY AS NEEDED. 90 capsule 3  . traZODone (DESYREL) 100 MG tablet Take 2 tabs at bedtime. 225 tablet 3   No current facility-administered medications on file prior to visit.    Allergies  Allergen Reactions  . Penicillins Swelling    tongue and throat swelling tolerates cephalosporins  . Trovan [Alatrofloxacin] Hives  . Rosiglitazone Maleate Swelling    Past Medical History  Diagnosis Date  . Depression   . Goiter, nodular     multi  . Dyslipidemia   . NIDDM, uncontrolled, with neuropathy   . Hypertension   . Allergy   . GERD (gastroesophageal reflux disease)   . Urinary incontinence   . Peripheral neuropathy   . Multiple sclerosis   . Breast cancer   . Hypothyroidism   . Asthma   . COPD (chronic obstructive pulmonary disease)   . DVT (deep venous thrombosis) 1990's    right leg  . Gout     Past Surgical History  Procedure Laterality Date  . Thyroidectomy  09/2004  . Transthoracic echocardiogram  05/16/2004  . Partial hysterectomy  1975  . Axillary hidradenitis excision  1993    Excision biopsy growth right axilla, benign   . Other surgical history      Thyroid biopsy 10/99  . Carpal tunnel release  03/2008    bilateral  . Mastectomy, radical  02/2009    left modified  . Tonsillectomy    . Appendectomy    . Femur fracture surgery Left june 2016    Family History  Problem Relation Age of Onset  . Kidney failure Mother   . Heart disease Father 51  . Heart  disease Brother   . Heart disease Brother     Social History   Social History  . Marital Status: Widowed    Spouse Name: N/A  . Number of Children: 4  . 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 Smoking status: Current Some Day Smoker -- 1.00 packs/day for 53 years  Types: Cigarettes    Last Attempt to Quit: 11/06/2013  . Smokeless tobacco: Never Used     Comment: almost stopped  . Alcohol Use: No  . Drug Use: No  . Sexual Activity: Not on file   Other Topics Concern  . Not on file   Social History Narrative   Widowed 1999 then 2nd Marriage--2000. Widowed again 2009   Living with daughter now   Has living will   Daughter [name] is health care POA.   Would accept rescitation but no prolonged artificial ventilation   No feeding tube if cognitively unaware   Review of Systems Appetite is okay Weight seems stable Bowels are okay No skin ulcers Some vaginal discharge again-thinks she has yeast again. The med worked well last time    Objective:   Physical Exam  Constitutional: She appears well-developed. No distress.  Neck: Normal range of motion. Neck supple.  Cardiovascular: Normal rate, regular rhythm and normal heart sounds.  Exam reveals no gallop.   No murmur heard. Pulmonary/Chest: Effort normal and breath sounds normal. No respiratory distress. She has no wheezes. She has no rales.  Abdominal: Soft. There is no tenderness.  Musculoskeletal:  No calf edema or tenderness  Lymphadenopathy:    She has no cervical adenopathy.  Neurological:  Severe trouble doing the transfer from commode-- finally able to pull over using trapeze and bed rail on opposite side  Skin: No rash noted.  No ulcers  Psychiatric: She has a normal mood and affect. Her behavior is normal.          Assessment & Plan:

## 2015-08-01 NOTE — Assessment & Plan Note (Signed)
Still no weight bearing Going back to ortho later this month--hopefully will be allowed to start weight bearing Gets yearly bone density--will recheck when able to move around

## 2015-08-01 NOTE — Assessment & Plan Note (Signed)
BP Readings from Last 3 Encounters:  08/01/15 112/62  07/03/15 134/83  05/31/15 128/50   Good control

## 2015-08-01 NOTE — Assessment & Plan Note (Signed)
This seems to be well controlled On copaxone

## 2015-08-02 ENCOUNTER — Ambulatory Visit: Payer: Medicare Other | Attending: Pain Medicine | Admitting: Pain Medicine

## 2015-08-02 ENCOUNTER — Encounter: Payer: Self-pay | Admitting: Pain Medicine

## 2015-08-02 VITALS — BP 130/65 | HR 67 | Temp 97.6°F | Resp 18 | Ht 62.0 in | Wt 269.0 lb

## 2015-08-02 DIAGNOSIS — E669 Obesity, unspecified: Secondary | ICD-10-CM | POA: Diagnosis not present

## 2015-08-02 DIAGNOSIS — Z8781 Personal history of (healed) traumatic fracture: Secondary | ICD-10-CM | POA: Diagnosis not present

## 2015-08-02 DIAGNOSIS — Z9889 Other specified postprocedural states: Secondary | ICD-10-CM | POA: Insufficient documentation

## 2015-08-02 DIAGNOSIS — J449 Chronic obstructive pulmonary disease, unspecified: Secondary | ICD-10-CM | POA: Diagnosis not present

## 2015-08-02 DIAGNOSIS — M47817 Spondylosis without myelopathy or radiculopathy, lumbosacral region: Secondary | ICD-10-CM | POA: Diagnosis not present

## 2015-08-02 DIAGNOSIS — M25561 Pain in right knee: Secondary | ICD-10-CM

## 2015-08-02 DIAGNOSIS — M791 Myalgia: Secondary | ICD-10-CM | POA: Diagnosis not present

## 2015-08-02 DIAGNOSIS — Z23 Encounter for immunization: Secondary | ICD-10-CM | POA: Diagnosis not present

## 2015-08-02 DIAGNOSIS — A5216 Charcot's arthropathy (tabetic): Secondary | ICD-10-CM | POA: Diagnosis not present

## 2015-08-02 DIAGNOSIS — R51 Headache: Secondary | ICD-10-CM | POA: Diagnosis present

## 2015-08-02 DIAGNOSIS — M5136 Other intervertebral disc degeneration, lumbar region: Secondary | ICD-10-CM | POA: Diagnosis not present

## 2015-08-02 DIAGNOSIS — I739 Peripheral vascular disease, unspecified: Secondary | ICD-10-CM

## 2015-08-02 DIAGNOSIS — M5137 Other intervertebral disc degeneration, lumbosacral region: Secondary | ICD-10-CM

## 2015-08-02 DIAGNOSIS — M109 Gout, unspecified: Secondary | ICD-10-CM | POA: Diagnosis not present

## 2015-08-02 DIAGNOSIS — M47816 Spondylosis without myelopathy or radiculopathy, lumbar region: Secondary | ICD-10-CM | POA: Diagnosis not present

## 2015-08-02 DIAGNOSIS — S7292XG Unspecified fracture of left femur, subsequent encounter for closed fracture with delayed healing: Secondary | ICD-10-CM | POA: Diagnosis not present

## 2015-08-02 DIAGNOSIS — E1340 Other specified diabetes mellitus with diabetic neuropathy, unspecified: Secondary | ICD-10-CM

## 2015-08-02 DIAGNOSIS — M19019 Primary osteoarthritis, unspecified shoulder: Secondary | ICD-10-CM | POA: Insufficient documentation

## 2015-08-02 DIAGNOSIS — L97519 Non-pressure chronic ulcer of other part of right foot with unspecified severity: Secondary | ICD-10-CM

## 2015-08-02 DIAGNOSIS — S72332D Displaced oblique fracture of shaft of left femur, subsequent encounter for closed fracture with routine healing: Secondary | ICD-10-CM | POA: Diagnosis not present

## 2015-08-02 DIAGNOSIS — F39 Unspecified mood [affective] disorder: Secondary | ICD-10-CM | POA: Diagnosis not present

## 2015-08-02 DIAGNOSIS — E11621 Type 2 diabetes mellitus with foot ulcer: Secondary | ICD-10-CM

## 2015-08-02 DIAGNOSIS — M5030 Other cervical disc degeneration, unspecified cervical region: Secondary | ICD-10-CM

## 2015-08-02 DIAGNOSIS — E1140 Type 2 diabetes mellitus with diabetic neuropathy, unspecified: Secondary | ICD-10-CM | POA: Diagnosis not present

## 2015-08-02 DIAGNOSIS — M5416 Radiculopathy, lumbar region: Secondary | ICD-10-CM | POA: Diagnosis not present

## 2015-08-02 DIAGNOSIS — M48062 Spinal stenosis, lumbar region with neurogenic claudication: Secondary | ICD-10-CM

## 2015-08-02 DIAGNOSIS — S7292XD Unspecified fracture of left femur, subsequent encounter for closed fracture with routine healing: Secondary | ICD-10-CM

## 2015-08-02 DIAGNOSIS — M461 Sacroiliitis, not elsewhere classified: Secondary | ICD-10-CM | POA: Diagnosis not present

## 2015-08-02 DIAGNOSIS — G35 Multiple sclerosis: Secondary | ICD-10-CM | POA: Diagnosis not present

## 2015-08-02 DIAGNOSIS — E1121 Type 2 diabetes mellitus with diabetic nephropathy: Secondary | ICD-10-CM | POA: Diagnosis not present

## 2015-08-02 DIAGNOSIS — M542 Cervicalgia: Secondary | ICD-10-CM | POA: Diagnosis present

## 2015-08-02 DIAGNOSIS — E1049 Type 1 diabetes mellitus with other diabetic neurological complication: Secondary | ICD-10-CM

## 2015-08-02 DIAGNOSIS — L97509 Non-pressure chronic ulcer of other part of unspecified foot with unspecified severity: Secondary | ICD-10-CM

## 2015-08-02 MED ORDER — OXYCODONE HCL 5 MG PO TABS: ORAL_TABLET | ORAL | Status: DC

## 2015-08-02 MED ORDER — GABAPENTIN 300 MG PO CAPS: ORAL_CAPSULE | ORAL | Status: DC

## 2015-08-02 NOTE — Progress Notes (Signed)
   Subjective:    Patient ID: Madeline [name], female    DOB: 1951-09-02, 64 y.o.   MRN: [id number]  HPI Patient is 64 year old female returns to [place] for further evaluation and treatment of pain involving neck headaches shoulder entire back and lower extremity regions. The patient is status post recent fracture of the left lower extremity and patient fell in her bathroom. Patient continues under care of Dr. Dorothyann Peng of [facility] and will follow-up with Dr. Dorothyann Peng as discussed. Patient tolerating medications well at this time. We will avoid interventional treatment. Patient also denies lesions of the lower extremities. We will may lead consider modification of treatment pending follow-up evaluation. We will continue Neurontin and oxycodone at this time. The patient is in agreement with suggested treatment plan       Review of Systems     Objective:   Physical Exam  There was tenderness of the splenius capitis and occipitalis musculature regions. Palpation of which reproduced pain of moderate degree. There was moderate tenderness to palpation of the acromioclavicular glenohumeral joint region on the right greater than the left. Palpation of the cervical facet cervical paraspinal musculature region and thoracic facet thoracic paraspinal muscles region was associated with tinged palpation of moderate degree. No crepitus of the thoracic region was noted. Tinel and Phalen's maneuver were without increased pain of significant degree. Patient was with evidence of decreased grip strength. Palpation over the lumbar paraspinal musculature region lumbar facet region associated with moderate discomfort. Extension and palpation of the lumbar facets reproduce moderate discomfort.. There was moderate tenderness of the PSIS PII S region as well as the gluteal and piriformis musculature regions. There was well-healed surgical scar of the left lower extremity without increased warmth  or erythema in the region of the scar there was decreased EHL strength. There appeared to be decreased sensation of the lower extremities in a stocking type distribution. There was negative Homans. The abdomen was nontender and no costovertebral tenderness was noted.      Assessment & Plan:    Degenerative disc disease lumbar spine Degenerative changes lumbar spine with multilevel involvement with L4-L5 level most significantly involved Lumbar facet syndrome  Fracture of left lower extremity(recent) Comminuted, oblique intra-articular fracture involving the mid and distal left femur Patient is status post surgical intervention for fracture performed at [facility] Department of orthopedic surgery  Diabetic neuropathy  Obesity  Degenerative joint disease of shoulder    PLAN   Continue present medication Neurontin and oxycodone  F/U PCP  Dr.Letvak  for evaliation of  BP diabetes mellitus fracture of the left lower extremity status post surgical intervention and general medical  condition  F/U surgical evaluation.. Patient will follow-up Dr. Dorothyann Peng of [place] orth[facility] as planned  F/U neurological evaluation. May consider pending follow-up evaluation  F/U wound care clinic evaluation as needed  May consider radiofrequency rhizolysis or intraspinal procedures pending response to present treatment and F/U evaluation   Patient to call [facility] should patient have concerns prior to scheduled return appointment.

## 2015-08-02 NOTE — Progress Notes (Signed)
Safety precautions to be maintained throughout the outpatient stay will include: orient to surroundings, keep bed in low position, maintain call bell within reach at all times, provide assistance with transfer out of bed and ambulation.  

## 2015-08-02 NOTE — Patient Instructions (Signed)
PLAN   Continue present medication Neurontin and oxycodone  F/U PCP Dr. Silvio Pate  for evaliation of  BP and general medical  condition  F/U surgical evaluation. . Patient will undergo follow-up surgical evaluation as discussed   F/U neurological evaluation. May consider pending follow-up evaluations  F/U wound care clinic evaluation  May consider radiofrequency rhizolysis or intraspinal procedures pending response to present treatment and F/U evaluation   Patient to call [facility] Management Center should patient have concerns prior to scheduled return appointment.

## 2015-08-02 NOTE — Addendum Note (Signed)
Addended by: Despina Hidden on: 08/02/2015 08:46 AM   Modules accepted: Orders

## 2015-08-07 ENCOUNTER — Inpatient Hospital Stay: Payer: Medicare Other | Attending: Oncology

## 2015-08-07 ENCOUNTER — Other Ambulatory Visit: Payer: Self-pay | Admitting: *Deleted

## 2015-08-07 DIAGNOSIS — A5216 Charcot's arthropathy (tabetic): Secondary | ICD-10-CM | POA: Diagnosis not present

## 2015-08-07 DIAGNOSIS — G35 Multiple sclerosis: Secondary | ICD-10-CM | POA: Diagnosis not present

## 2015-08-07 DIAGNOSIS — M109 Gout, unspecified: Secondary | ICD-10-CM | POA: Diagnosis not present

## 2015-08-07 DIAGNOSIS — J449 Chronic obstructive pulmonary disease, unspecified: Secondary | ICD-10-CM | POA: Diagnosis not present

## 2015-08-07 DIAGNOSIS — S72332D Displaced oblique fracture of shaft of left femur, subsequent encounter for closed fracture with routine healing: Secondary | ICD-10-CM | POA: Diagnosis not present

## 2015-08-07 DIAGNOSIS — E1121 Type 2 diabetes mellitus with diabetic nephropathy: Secondary | ICD-10-CM | POA: Diagnosis not present

## 2015-08-07 MED ORDER — COLCHICINE 0.6 MG PO TABS: 0.6000 mg | ORAL_TABLET | Freq: Two times a day (BID) | ORAL | Status: DC | PRN

## 2015-08-07 MED ORDER — DIAZEPAM 5 MG PO TABS: 5.0000 mg | ORAL_TABLET | Freq: Two times a day (BID) | ORAL | Status: DC

## 2015-08-07 NOTE — Telephone Encounter (Signed)
Patient scheduled for CPE in January 2017

## 2015-08-07 NOTE — Telephone Encounter (Signed)
Last OV 08/01/15, last filled #[phone] on 06/05/15

## 2015-08-07 NOTE — Telephone Encounter (Signed)
rx called into pharmacy

## 2015-08-07 NOTE — Telephone Encounter (Signed)
Approved: #60 x 0 

## 2015-08-08 DIAGNOSIS — W19XXXA Unspecified fall, initial encounter: Secondary | ICD-10-CM | POA: Diagnosis not present

## 2015-08-08 DIAGNOSIS — S72321D Displaced transverse fracture of shaft of right femur, subsequent encounter for closed fracture with routine healing: Secondary | ICD-10-CM | POA: Diagnosis not present

## 2015-08-08 DIAGNOSIS — S72332G Displaced oblique fracture of shaft of left femur, subsequent encounter for closed fracture with delayed healing: Secondary | ICD-10-CM | POA: Diagnosis not present

## 2015-08-08 DIAGNOSIS — S7292XA Unspecified fracture of left femur, initial encounter for closed fracture: Secondary | ICD-10-CM | POA: Diagnosis not present

## 2015-08-08 DIAGNOSIS — J449 Chronic obstructive pulmonary disease, unspecified: Secondary | ICD-10-CM | POA: Diagnosis not present

## 2015-08-08 DIAGNOSIS — S72492D Other fracture of lower end of left femur, subsequent encounter for closed fracture with routine healing: Secondary | ICD-10-CM | POA: Diagnosis not present

## 2015-08-08 DIAGNOSIS — T148 Other injury of unspecified body region: Secondary | ICD-10-CM | POA: Diagnosis not present

## 2015-08-08 DIAGNOSIS — R531 Weakness: Secondary | ICD-10-CM | POA: Diagnosis not present

## 2015-08-08 DIAGNOSIS — R2689 Other abnormalities of gait and mobility: Secondary | ICD-10-CM | POA: Diagnosis not present

## 2015-08-09 DIAGNOSIS — M109 Gout, unspecified: Secondary | ICD-10-CM | POA: Diagnosis not present

## 2015-08-09 DIAGNOSIS — S72332D Displaced oblique fracture of shaft of left femur, subsequent encounter for closed fracture with routine healing: Secondary | ICD-10-CM | POA: Diagnosis not present

## 2015-08-09 DIAGNOSIS — E1121 Type 2 diabetes mellitus with diabetic nephropathy: Secondary | ICD-10-CM | POA: Diagnosis not present

## 2015-08-09 DIAGNOSIS — J449 Chronic obstructive pulmonary disease, unspecified: Secondary | ICD-10-CM | POA: Diagnosis not present

## 2015-08-09 DIAGNOSIS — A5216 Charcot's arthropathy (tabetic): Secondary | ICD-10-CM | POA: Diagnosis not present

## 2015-08-09 DIAGNOSIS — G35 Multiple sclerosis: Secondary | ICD-10-CM | POA: Diagnosis not present

## 2015-08-10 DIAGNOSIS — M109 Gout, unspecified: Secondary | ICD-10-CM | POA: Diagnosis not present

## 2015-08-10 DIAGNOSIS — A5216 Charcot's arthropathy (tabetic): Secondary | ICD-10-CM | POA: Diagnosis not present

## 2015-08-10 DIAGNOSIS — S72332D Displaced oblique fracture of shaft of left femur, subsequent encounter for closed fracture with routine healing: Secondary | ICD-10-CM | POA: Diagnosis not present

## 2015-08-10 DIAGNOSIS — E1121 Type 2 diabetes mellitus with diabetic nephropathy: Secondary | ICD-10-CM | POA: Diagnosis not present

## 2015-08-10 DIAGNOSIS — J449 Chronic obstructive pulmonary disease, unspecified: Secondary | ICD-10-CM | POA: Diagnosis not present

## 2015-08-10 DIAGNOSIS — G35 Multiple sclerosis: Secondary | ICD-10-CM | POA: Diagnosis not present

## 2015-08-13 DIAGNOSIS — S72332D Displaced oblique fracture of shaft of left femur, subsequent encounter for closed fracture with routine healing: Secondary | ICD-10-CM | POA: Diagnosis not present

## 2015-08-13 DIAGNOSIS — G35 Multiple sclerosis: Secondary | ICD-10-CM | POA: Diagnosis not present

## 2015-08-13 DIAGNOSIS — A5216 Charcot's arthropathy (tabetic): Secondary | ICD-10-CM | POA: Diagnosis not present

## 2015-08-13 DIAGNOSIS — M109 Gout, unspecified: Secondary | ICD-10-CM | POA: Diagnosis not present

## 2015-08-13 DIAGNOSIS — E1121 Type 2 diabetes mellitus with diabetic nephropathy: Secondary | ICD-10-CM | POA: Diagnosis not present

## 2015-08-13 DIAGNOSIS — J449 Chronic obstructive pulmonary disease, unspecified: Secondary | ICD-10-CM | POA: Diagnosis not present

## 2015-08-14 DIAGNOSIS — A5216 Charcot's arthropathy (tabetic): Secondary | ICD-10-CM | POA: Diagnosis not present

## 2015-08-14 DIAGNOSIS — S72332D Displaced oblique fracture of shaft of left femur, subsequent encounter for closed fracture with routine healing: Secondary | ICD-10-CM | POA: Diagnosis not present

## 2015-08-14 DIAGNOSIS — J449 Chronic obstructive pulmonary disease, unspecified: Secondary | ICD-10-CM | POA: Diagnosis not present

## 2015-08-14 DIAGNOSIS — M109 Gout, unspecified: Secondary | ICD-10-CM | POA: Diagnosis not present

## 2015-08-14 DIAGNOSIS — E1121 Type 2 diabetes mellitus with diabetic nephropathy: Secondary | ICD-10-CM | POA: Diagnosis not present

## 2015-08-14 DIAGNOSIS — G35 Multiple sclerosis: Secondary | ICD-10-CM | POA: Diagnosis not present

## 2015-08-15 ENCOUNTER — Telehealth: Payer: Self-pay

## 2015-08-15 DIAGNOSIS — E1121 Type 2 diabetes mellitus with diabetic nephropathy: Secondary | ICD-10-CM | POA: Diagnosis not present

## 2015-08-15 DIAGNOSIS — J449 Chronic obstructive pulmonary disease, unspecified: Secondary | ICD-10-CM | POA: Diagnosis not present

## 2015-08-15 DIAGNOSIS — A5216 Charcot's arthropathy (tabetic): Secondary | ICD-10-CM | POA: Diagnosis not present

## 2015-08-15 DIAGNOSIS — G35 Multiple sclerosis: Secondary | ICD-10-CM | POA: Diagnosis not present

## 2015-08-15 DIAGNOSIS — M109 Gout, unspecified: Secondary | ICD-10-CM | POA: Diagnosis not present

## 2015-08-15 DIAGNOSIS — S72332D Displaced oblique fracture of shaft of left femur, subsequent encounter for closed fracture with routine healing: Secondary | ICD-10-CM | POA: Diagnosis not present

## 2015-08-15 NOTE — Telephone Encounter (Signed)
Pt called to speak to a supervisor.  She is very upset and after listening to her talk about being angry and upset her daughter came on the phone and said she would try to find out what her mother's concerns were.  The daughter Madeline [name] will call me back tomorrow.

## 2015-08-15 NOTE — Telephone Encounter (Signed)
Spoke with Candace and advised results

## 2015-08-15 NOTE — Telephone Encounter (Signed)
I cannot make it out there for a while--I was just there  I would just have her use neosporin on it till it heals and let me know if it develops redness, etc

## 2015-08-15 NOTE — Telephone Encounter (Signed)
Madeline [name] PT with [facility] saw pt this morning; pt gave herself Copaxone injection in the hip area instead of buttock; initially had blister like area where injection was given, now appears crater like 1/2 " in circumference. Redness around site but no cellulitis tendency noted. No drainage, no fever and no pain. Temp 98.7, P 66 and BP 148/82 (BP taken before BP med taken)Sherry said before can treat a physician has to evaluate and then give orders for treatment. Pt has great difficulty leaving the home and Dr Silvio Pate has done home visits. Sherry request home visit with orders to follow to [facility].Please advise.

## 2015-08-16 NOTE — Telephone Encounter (Signed)
Please try again to see what the problem is. I just can't get back out for a home visit, and if she had a local reaction to the shot--I am not sure I need to. I gave advice on topical care for the problem

## 2015-08-17 NOTE — Telephone Encounter (Signed)
Spoke with daughter and advised results.  

## 2015-08-17 NOTE — Telephone Encounter (Signed)
Spoke with daughter [name] and she wanted to apologize for the way her mother talked to [place] and for her "cussing" per daughter. Daughter states that Dr. Dorothyann Peng the surgeon told her mother that if she wanted to walk again she would have to stop smoking because there has been no bone growth, also Dr. Dorothyann Peng is trying to get pt's Vit D checked thru [facility]. Daughter would like Dr. Silvio Pate to order vit d if possible. Daughter had knee surgery and can't get her mother to [facility] for labs. Please advise

## 2015-08-17 NOTE — Telephone Encounter (Signed)
The surgeon should be able to get that done through the home health It makes sense for her to start a daily vitamin D supplement of 1000 mcg--if she isn't already taking this

## 2015-08-22 DIAGNOSIS — J449 Chronic obstructive pulmonary disease, unspecified: Secondary | ICD-10-CM | POA: Diagnosis not present

## 2015-08-22 DIAGNOSIS — E1121 Type 2 diabetes mellitus with diabetic nephropathy: Secondary | ICD-10-CM | POA: Diagnosis not present

## 2015-08-22 DIAGNOSIS — M109 Gout, unspecified: Secondary | ICD-10-CM | POA: Diagnosis not present

## 2015-08-22 DIAGNOSIS — A5216 Charcot's arthropathy (tabetic): Secondary | ICD-10-CM | POA: Diagnosis not present

## 2015-08-22 DIAGNOSIS — S72332D Displaced oblique fracture of shaft of left femur, subsequent encounter for closed fracture with routine healing: Secondary | ICD-10-CM | POA: Diagnosis not present

## 2015-08-22 DIAGNOSIS — G35 Multiple sclerosis: Secondary | ICD-10-CM | POA: Diagnosis not present

## 2015-08-23 DIAGNOSIS — S72332D Displaced oblique fracture of shaft of left femur, subsequent encounter for closed fracture with routine healing: Secondary | ICD-10-CM | POA: Diagnosis not present

## 2015-08-23 DIAGNOSIS — A5216 Charcot's arthropathy (tabetic): Secondary | ICD-10-CM | POA: Diagnosis not present

## 2015-08-23 DIAGNOSIS — M109 Gout, unspecified: Secondary | ICD-10-CM | POA: Diagnosis not present

## 2015-08-23 DIAGNOSIS — J449 Chronic obstructive pulmonary disease, unspecified: Secondary | ICD-10-CM | POA: Diagnosis not present

## 2015-08-23 DIAGNOSIS — G35 Multiple sclerosis: Secondary | ICD-10-CM | POA: Diagnosis not present

## 2015-08-23 DIAGNOSIS — E1121 Type 2 diabetes mellitus with diabetic nephropathy: Secondary | ICD-10-CM | POA: Diagnosis not present

## 2015-08-29 ENCOUNTER — Inpatient Hospital Stay: Payer: Medicare Other | Admitting: Oncology

## 2015-08-29 DIAGNOSIS — A5216 Charcot's arthropathy (tabetic): Secondary | ICD-10-CM | POA: Diagnosis not present

## 2015-08-29 DIAGNOSIS — G35 Multiple sclerosis: Secondary | ICD-10-CM | POA: Diagnosis not present

## 2015-08-29 DIAGNOSIS — S72332D Displaced oblique fracture of shaft of left femur, subsequent encounter for closed fracture with routine healing: Secondary | ICD-10-CM | POA: Diagnosis not present

## 2015-08-29 DIAGNOSIS — M109 Gout, unspecified: Secondary | ICD-10-CM | POA: Diagnosis not present

## 2015-08-29 DIAGNOSIS — E1121 Type 2 diabetes mellitus with diabetic nephropathy: Secondary | ICD-10-CM | POA: Diagnosis not present

## 2015-08-29 DIAGNOSIS — J449 Chronic obstructive pulmonary disease, unspecified: Secondary | ICD-10-CM | POA: Diagnosis not present

## 2015-08-30 ENCOUNTER — Encounter: Payer: Medicare Other | Admitting: Pain Medicine

## 2015-09-04 ENCOUNTER — Ambulatory Visit: Payer: Medicare Other | Attending: Pain Medicine | Admitting: Pain Medicine

## 2015-09-04 ENCOUNTER — Encounter: Payer: Self-pay | Admitting: Pain Medicine

## 2015-09-04 VITALS — BP 124/81 | HR 66 | Temp 97.8°F | Resp 15 | Ht 63.0 in

## 2015-09-04 DIAGNOSIS — L97519 Non-pressure chronic ulcer of other part of right foot with unspecified severity: Secondary | ICD-10-CM

## 2015-09-04 DIAGNOSIS — Z9889 Other specified postprocedural states: Secondary | ICD-10-CM | POA: Insufficient documentation

## 2015-09-04 DIAGNOSIS — M47817 Spondylosis without myelopathy or radiculopathy, lumbosacral region: Secondary | ICD-10-CM | POA: Diagnosis not present

## 2015-09-04 DIAGNOSIS — I739 Peripheral vascular disease, unspecified: Secondary | ICD-10-CM

## 2015-09-04 DIAGNOSIS — M51369 Other intervertebral disc degeneration, lumbar region without mention of lumbar back pain or lower extremity pain: Secondary | ICD-10-CM

## 2015-09-04 DIAGNOSIS — E11621 Type 2 diabetes mellitus with foot ulcer: Secondary | ICD-10-CM

## 2015-09-04 DIAGNOSIS — M19019 Primary osteoarthritis, unspecified shoulder: Secondary | ICD-10-CM | POA: Diagnosis not present

## 2015-09-04 DIAGNOSIS — M5136 Other intervertebral disc degeneration, lumbar region: Secondary | ICD-10-CM | POA: Insufficient documentation

## 2015-09-04 DIAGNOSIS — M5416 Radiculopathy, lumbar region: Secondary | ICD-10-CM | POA: Diagnosis not present

## 2015-09-04 DIAGNOSIS — M461 Sacroiliitis, not elsewhere classified: Secondary | ICD-10-CM | POA: Diagnosis not present

## 2015-09-04 DIAGNOSIS — E669 Obesity, unspecified: Secondary | ICD-10-CM | POA: Diagnosis not present

## 2015-09-04 DIAGNOSIS — S7292XD Unspecified fracture of left femur, subsequent encounter for closed fracture with routine healing: Secondary | ICD-10-CM

## 2015-09-04 DIAGNOSIS — E1140 Type 2 diabetes mellitus with diabetic neuropathy, unspecified: Secondary | ICD-10-CM | POA: Insufficient documentation

## 2015-09-04 DIAGNOSIS — M5137 Other intervertebral disc degeneration, lumbosacral region: Secondary | ICD-10-CM

## 2015-09-04 DIAGNOSIS — M791 Myalgia: Secondary | ICD-10-CM | POA: Diagnosis not present

## 2015-09-04 DIAGNOSIS — E1340 Other specified diabetes mellitus with diabetic neuropathy, unspecified: Secondary | ICD-10-CM

## 2015-09-04 DIAGNOSIS — M25561 Pain in right knee: Secondary | ICD-10-CM

## 2015-09-04 DIAGNOSIS — M51379 Other intervertebral disc degeneration, lumbosacral region without mention of lumbar back pain or lower extremity pain: Secondary | ICD-10-CM

## 2015-09-04 DIAGNOSIS — L97509 Non-pressure chronic ulcer of other part of unspecified foot with unspecified severity: Secondary | ICD-10-CM

## 2015-09-04 DIAGNOSIS — M47816 Spondylosis without myelopathy or radiculopathy, lumbar region: Secondary | ICD-10-CM

## 2015-09-04 DIAGNOSIS — M5030 Other cervical disc degeneration, unspecified cervical region: Secondary | ICD-10-CM

## 2015-09-04 DIAGNOSIS — M48062 Spinal stenosis, lumbar region with neurogenic claudication: Secondary | ICD-10-CM

## 2015-09-04 MED ORDER — GABAPENTIN 300 MG PO CAPS: ORAL_CAPSULE | ORAL | Status: DC

## 2015-09-04 MED ORDER — OXYCODONE HCL 5 MG PO TABS: ORAL_TABLET | ORAL | Status: DC

## 2015-09-04 NOTE — Patient Instructions (Signed)
  Plan   Continue present medication Neurontin and oxycodone  F/U PCP Dr. Silvio Pate  for evaliation of  BP and general medical  condition  F/U surgical evaluation. . Patient will undergo follow-up surgical evaluation with Dr. Dorothyann Peng as discussed   F/U neurological evaluation. May consider pending follow-up evaluations  F/U wound care clinic evaluation  May consider radiofrequency rhizolysis or intraspinal procedures pending response to present treatment and F/U evaluation   Patient to call [facility] Management Center should patient have concerns prior to scheduled return appointment.

## 2015-09-04 NOTE — Progress Notes (Signed)
   Subjective:    Patient ID: SAIDE LANUZA, female    DOB: 08-13-1951, 64 y.o.   MRN: [id number]  HPI  Patient is 64 year old female who returns to [facility] for further evaluation and treatment of pain involving the region of the neck and entire back upper and lower extremity regions especially the lower back and left lower extremity region status post fall and fracture of the left lower extremity requiring open reduction internal fixation by  Dr. Hanley Seamen orthopedic surgeon. Patient continues medication consisting of oxycodone and Neurontin. We discussed patient's condition at the present time patient is hoping to be approved for bone stimulator due to the delayed healing of her fractures of the lower extremity. We will continue present medications and avoid interventional treatment as discussed with patient on today's visit. The patient was with understanding and agreement with suggested treatment plan and will follow-up with primary care physician Dr.Letvak and Dr. Dorothyann Peng as discussed and as planned     Review of Systems     Objective:   Physical Exam  There was tenderness of the splenius capitis and occipitalis musculature region palpation of which reproduced pain of moderate degree. There was moderate tends to palpation over the cervical facet cervical paraspinal musculature region. Palpation of the acromioclavicular and glenohumeral joint regions reproduced moderate discomfort. Patient was with slightly decreased grip strength and Tinel and Phalen's maneuver were without increased pain of significant degree. Palpation over the thoracic facet thoracic paraspinal musculature region was a tends to palpation with evidence of moderate muscle spasms of the thoracic region. No crepitus of the thoracic region was noted. Palpation over the lumbar paraspinal musculature region lumbar facet region was with moderate tends to palpation. There was moderate tends to of the greater  trochanteric region and iliotibial band region as well examination of the lower extremity revealed the left lower extremity to be with well-healed surgical scars of the left lower extremity in the region of the knee and the proximal portion of the left lower extremity. There was tends to palpation of the left lower extremity without increased warmth and erythema of the lower extremity noted there was negative clonus and negative Homans There was no sensory deficit of dermatomal distribution was detected. There was no abdominal tenderness to palpation and no costovertebral angle tenderness was noted      Assessment & Plan:    Degenerative disc disease lumbar spine Degenerative changes lumbar spine with multilevel involvement with L4-L5 level most significantly involved Lumbar facet syndrome  Fracture of left lower extremity(recent) Comminuted, oblique intra-articular fracture involving the mid and distal left femur Patient is status post surgical intervention for fracture performed at [facility] Department of orthopedic surgery  Diabetic neuropathy  Obesity  Degenerative joint disease of shoulder   PLAN   Continue present medications Neurontin and oxycodone  F/U PCP  Dr.Letvak  for evaliation of  BP diabetes mellitus fracture of the left lower extremity status post surgical intervention and general medical  condition  F/U surgical evaluation.. Patient will follow-up Dr. Dorothyann Peng of [facility] orthopedic as planned to discuss bone stimulator and patient's response to surgery  F/U neurological evaluation. May consider pending follow-up evaluation  F/U wound care clinic evaluation as needed  May consider radiofrequency rhizolysis or intraspinal procedures pending response to present treatment and F/U evaluation   Patient to call [facility] should patient have concerns prior to scheduled return appointment.

## 2015-09-04 NOTE — Progress Notes (Signed)
Safety precautions to be maintained throughout the outpatient stay will include: orient to surroundings, keep bed in low position, maintain call bell within reach at all times, provide assistance with transfer out of bed and ambulation.  

## 2015-09-07 DIAGNOSIS — J449 Chronic obstructive pulmonary disease, unspecified: Secondary | ICD-10-CM | POA: Diagnosis not present

## 2015-09-11 ENCOUNTER — Encounter: Payer: Self-pay | Admitting: Oncology

## 2015-09-11 ENCOUNTER — Inpatient Hospital Stay: Payer: Medicare Other

## 2015-09-11 ENCOUNTER — Inpatient Hospital Stay: Payer: Medicare Other | Attending: Oncology | Admitting: Oncology

## 2015-09-11 VITALS — BP 118/76 | HR 75 | Temp 98.9°F | Resp 18 | Ht 62.0 in

## 2015-09-11 DIAGNOSIS — Z794 Long term (current) use of insulin: Secondary | ICD-10-CM | POA: Diagnosis not present

## 2015-09-11 DIAGNOSIS — E785 Hyperlipidemia, unspecified: Secondary | ICD-10-CM | POA: Diagnosis not present

## 2015-09-11 DIAGNOSIS — Z87891 Personal history of nicotine dependence: Secondary | ICD-10-CM | POA: Insufficient documentation

## 2015-09-11 DIAGNOSIS — E119 Type 2 diabetes mellitus without complications: Secondary | ICD-10-CM | POA: Insufficient documentation

## 2015-09-11 DIAGNOSIS — R5383 Other fatigue: Secondary | ICD-10-CM | POA: Diagnosis not present

## 2015-09-11 DIAGNOSIS — Z9012 Acquired absence of left breast and nipple: Secondary | ICD-10-CM

## 2015-09-11 DIAGNOSIS — Z7984 Long term (current) use of oral hypoglycemic drugs: Secondary | ICD-10-CM | POA: Diagnosis not present

## 2015-09-11 DIAGNOSIS — C50912 Malignant neoplasm of unspecified site of left female breast: Secondary | ICD-10-CM

## 2015-09-11 DIAGNOSIS — Z452 Encounter for adjustment and management of vascular access device: Secondary | ICD-10-CM | POA: Insufficient documentation

## 2015-09-11 DIAGNOSIS — J449 Chronic obstructive pulmonary disease, unspecified: Secondary | ICD-10-CM | POA: Diagnosis not present

## 2015-09-11 DIAGNOSIS — Z8781 Personal history of (healed) traumatic fracture: Secondary | ICD-10-CM | POA: Insufficient documentation

## 2015-09-11 DIAGNOSIS — G35 Multiple sclerosis: Secondary | ICD-10-CM | POA: Insufficient documentation

## 2015-09-11 DIAGNOSIS — R5381 Other malaise: Secondary | ICD-10-CM | POA: Insufficient documentation

## 2015-09-11 DIAGNOSIS — Z853 Personal history of malignant neoplasm of breast: Secondary | ICD-10-CM | POA: Insufficient documentation

## 2015-09-11 DIAGNOSIS — K219 Gastro-esophageal reflux disease without esophagitis: Secondary | ICD-10-CM | POA: Diagnosis not present

## 2015-09-11 DIAGNOSIS — Z7982 Long term (current) use of aspirin: Secondary | ICD-10-CM | POA: Diagnosis not present

## 2015-09-11 DIAGNOSIS — I10 Essential (primary) hypertension: Secondary | ICD-10-CM | POA: Diagnosis not present

## 2015-09-11 DIAGNOSIS — Z9223 Personal history of estrogen therapy: Secondary | ICD-10-CM

## 2015-09-11 DIAGNOSIS — E039 Hypothyroidism, unspecified: Secondary | ICD-10-CM | POA: Insufficient documentation

## 2015-09-11 DIAGNOSIS — Z79899 Other long term (current) drug therapy: Secondary | ICD-10-CM | POA: Diagnosis not present

## 2015-09-11 DIAGNOSIS — C801 Malignant (primary) neoplasm, unspecified: Secondary | ICD-10-CM

## 2015-09-11 DIAGNOSIS — M109 Gout, unspecified: Secondary | ICD-10-CM | POA: Diagnosis not present

## 2015-09-11 DIAGNOSIS — J45909 Unspecified asthma, uncomplicated: Secondary | ICD-10-CM | POA: Diagnosis not present

## 2015-09-11 DIAGNOSIS — M549 Dorsalgia, unspecified: Secondary | ICD-10-CM | POA: Diagnosis not present

## 2015-09-11 MED ORDER — SODIUM CHLORIDE 0.9 % IJ SOLN
10.0000 mL | INTRAMUSCULAR | Status: DC | PRN
  Administered 2015-09-11: 10 mL
  Filled 2015-09-11: qty 10

## 2015-09-11 MED ORDER — HEPARIN SOD (PORK) LOCK FLUSH 100 UNIT/ML IV SOLN
500.0000 [IU] | Freq: Once | INTRAVENOUS | Status: AC
  Administered 2015-09-11: 500 [IU] via INTRAVENOUS
  Filled 2015-09-11: qty 5

## 2015-09-12 DIAGNOSIS — S72332K Displaced oblique fracture of shaft of left femur, subsequent encounter for closed fracture with nonunion: Secondary | ICD-10-CM | POA: Diagnosis not present

## 2015-09-12 DIAGNOSIS — E669 Obesity, unspecified: Secondary | ICD-10-CM | POA: Diagnosis not present

## 2015-09-12 DIAGNOSIS — E119 Type 2 diabetes mellitus without complications: Secondary | ICD-10-CM | POA: Diagnosis not present

## 2015-09-18 ENCOUNTER — Other Ambulatory Visit: Payer: Self-pay | Admitting: *Deleted

## 2015-09-18 DIAGNOSIS — M79652 Pain in left thigh: Secondary | ICD-10-CM | POA: Diagnosis not present

## 2015-09-18 DIAGNOSIS — S72332G Displaced oblique fracture of shaft of left femur, subsequent encounter for closed fracture with delayed healing: Secondary | ICD-10-CM | POA: Diagnosis not present

## 2015-09-18 DIAGNOSIS — S72302D Unspecified fracture of shaft of left femur, subsequent encounter for closed fracture with routine healing: Secondary | ICD-10-CM | POA: Diagnosis not present

## 2015-09-18 MED ORDER — TRAZODONE HCL 100 MG PO TABS: 200.0000 mg | ORAL_TABLET | Freq: Every day | ORAL | Status: AC

## 2015-09-18 MED ORDER — METFORMIN HCL 1000 MG PO TABS
1000.0000 mg | ORAL_TABLET | Freq: Every day | ORAL | Status: DC
Start: 2015-09-18 — End: 2016-02-01

## 2015-09-18 MED ORDER — MELOXICAM 15 MG PO TABS: 15.0000 mg | ORAL_TABLET | Freq: Every day | ORAL | Status: DC | PRN

## 2015-09-19 NOTE — Progress Notes (Signed)
[place]  Telephone:(336) [phone] [phone]) [phone]  ID: Madeline [name] OB: [birth year]  MR#: [id number]  AOZ#:[id number]  Patient Care Team: Venia Carbon, MD as PCP - General  CHIEF COMPLAINT:  Chief Complaint  Patient presents with  . Breast Cancer    INTERVAL HISTORY: Patient returns to clinic today for routine yearly follow-up. She fractured her left femur over the summer and continues with rehabilitation and requiring a wheelchair to get around. She otherwise feels well. She has no neurologic complaints. She has a good appetite and denies any nausea, vomiting, constipation, or diarrhea.  She has no chest pain or shortness of breath.  Patient has no urinary complaints.  She offers no further specific complaints today.  REVIEW OF SYSTEMS:   Review of Systems  Constitutional: Negative.  Negative for fever and malaise/fatigue.  Respiratory: Negative.   Cardiovascular: Negative.   Gastrointestinal: Negative.   Musculoskeletal: Positive for joint pain and falls.  Neurological: Negative.  Negative for weakness.    As per HPI. Otherwise, a complete review of systems is negatve.  PAST MEDICAL HISTORY: Past Medical History  Diagnosis Date  . Depression   . Goiter, nodular     multi  . Dyslipidemia   . NIDDM, uncontrolled, with neuropathy   . Hypertension   . Allergy   . GERD (gastroesophageal reflux disease)   . Urinary incontinence   . Peripheral neuropathy ([place])   . Multiple sclerosis ([place])   . Breast cancer ([place])   . Hypothyroidism   . Asthma   . COPD (chronic obstructive pulmonary disease) ([place])   . DVT (deep venous thrombosis) ([facility]) 1990's    right leg  . Gout     PAST SURGICAL HISTORY: Past Surgical History  Procedure Laterality Date  . Thyroidectomy  09/2004  . Transthoracic echocardiogram  05/16/2004  . Partial hysterectomy  1975  . Axillary hidradenitis excision  1993    Excision biopsy growth right axilla, benign   . Other  surgical history      Thyroid biopsy 10/99  . Carpal tunnel release  03/2008    bilateral  . Mastectomy, radical  02/2009    left modified  . Tonsillectomy    . Appendectomy    . Femur fracture surgery Left june 2016    FAMILY HISTORY Family History  Problem Relation Age of Onset  . Kidney failure Mother   . Heart disease Father 23  . Heart disease Brother   . Heart disease Brother        ADVANCED DIRECTIVES:    HEALTH MAINTENANCE: Social History  Substance Use Topics  . Smoking status: Former Smoker -- 1.00 packs/day for 50 years    Types: Cigarettes    Quit date: 07/12/2015  . Smokeless tobacco: Never Used     Comment: almost stopped  . Alcohol Use: No     Colonoscopy:  PAP:  Bone density:  Lipid panel:  Allergies  Allergen Reactions  . Penicillins Swelling    tongue and throat swelling tolerates cephalosporins  . Trovan [Alatrofloxacin] Hives  . Rosiglitazone Maleate Swelling    Current Outpatient Prescriptions  Medication Sig Dispense Refill  . albuterol (PROVENTIL) (2.5 MG/3ML) 0.083% nebulizer solution Take 3 mLs (2.5 mg total) by nebulization every 6 (six) hours as needed for wheezing or shortness of breath. 75 mL 11  . allopurinol (ZYLOPRIM) 300 MG tablet Take 1 tablet (300 mg total) by mouth daily. 90 tablet 3  . aspirin 325 MG tablet Take 325  mg by mouth daily.      . baclofen (LIORESAL) 10 MG tablet Limit 1 tablet by mouth twice a day to 3 times a day if tolerated (Patient taking differently: 10 mg 2 (two) times daily. Third one prn) 80 each 0  . budesonide-formoterol (SYMBICORT) 160-4.5 MCG/ACT inhaler Inhale 1 puff into the lungs 2 (two) times daily. 10.2 g 5  . Calcium Carbonate-Vitamin D (CALCIUM-VITAMIN D) 600-200 MG-UNIT CAPS Take by mouth 2 (two) times daily.     . carvedilol (COREG) 25 MG tablet Take 1 tablet (25 mg total) by mouth 2 (two) times daily with a meal. 180 tablet 3  . Cholecalciferol (VITAMIN D3) 1000 UNITS CAPS Take 4,000  capsules by mouth every morning.    . colchicine 0.6 MG tablet Take 1 tablet (0.6 mg total) by mouth 2 (two) times daily as needed. 60 tablet 3  . COPAXONE 40 MG/ML SOSY Inject 51ml (40mg ) subcutaneously three times a week. 36 Syringe 3  . diazepam (VALIUM) 5 MG tablet Take 1 tablet (5 mg total) by mouth 2 (two) times daily. (Patient taking differently: Take 5 mg by mouth 2 (two) times daily as needed. ) 60 tablet 0  . fish oil-omega-3 fatty acids 1000 MG capsule Take 2 g by mouth daily.      Marland Kitchen FLUoxetine (PROZAC) 20 MG capsule TAKE 2 CAPSULES BY MOUTH EVERY DAY 180 capsule 3  . furosemide (LASIX) 80 MG tablet Take 1 tablet (80 mg total) by mouth daily. Take second dose around lunchtime if your legs are swelling 180 tablet 3  . gabapentin (NEURONTIN) 300 MG capsule Limit 1-2 tablets by mouth 2-4 times per day if tolerated 240 capsule 0  . glucose blood (ONE TOUCH TEST STRIPS) test strip Use as instructed to test blood sugar 3 times daily dx: E11.40 300 each 3  . insulin lispro (HUMALOG) 100 UNIT/ML KiwkPen Use according to sliding scale as directed 15 mL 3  . Insulin Pen Needle 30G X 5 MM MISC Use as instructed to inject insulin 2-3 times daily dx 250.60 300 each 6  . LANTUS SOLOSTAR 100 UNIT/ML Solostar Pen Inject 45 Units into the skin 2 (two) times daily.     Marland Kitchen levothyroxine (SYNTHROID, LEVOTHROID) 175 MCG tablet Take 1 tablet (175 mcg total) by mouth daily. 90 tablet 3  . omeprazole (PRILOSEC) 20 MG capsule TAKE ONE CAPSULE BY MOUTH TWICE DAILY 180 capsule 3  . ONETOUCH DELICA LANCETS 64W MISC Use to test blood sugar three times daily dx: E11.40 300 each 0  . oxyCODONE (OXY IR/ROXICODONE) 5 MG immediate release tablet Limit 1 tablet by mouth 2-4 times per day if tolerated 120 tablet 0  . potassium chloride (KLOR-CON M10) 10 MEQ tablet TAKE 2 TABLETS BY MOUTH EVERY DAY 180 tablet 3  . tiotropium (SPIRIVA HANDIHALER) 18 MCG inhalation capsule PLACE 1 CAPSULE (18 MCG TOTAL) INTO INHALER AND INHALE  DAILY AS NEEDED. 90 capsule 3  . meloxicam (MOBIC) 15 MG tablet Take 1 tablet (15 mg total) by mouth daily as needed. 90 tablet 3  . metFORMIN (GLUCOPHAGE) 1000 MG tablet Take 1 tablet (1,000 mg total) by mouth daily with breakfast. 90 tablet 3  . traZODone (DESYREL) 100 MG tablet Take 2 tablets (200 mg total) by mouth at bedtime. 180 tablet 3   No current facility-administered medications for this visit.    OBJECTIVE: Filed Vitals:   09/11/15 1458  BP: 118/76  Pulse: 75  Temp: 98.9 F (37.2 C)  Resp:  18     There is no weight on file to calculate BMI.    ECOG FS:2 - Symptomatic, <50% confined to bed  General: Well-developed, well-nourished, no acute distress. Sitting in a wheelchair. Eyes: Pink conjunctiva, anicteric sclera. Breasts: Left breast mastectomy.  Patient requested exam be deferred today. Lungs: Clear to auscultation bilaterally. Heart: Regular rate and rhythm. No rubs, murmurs, or gallops. Abdomen: Soft, nontender, nondistended. No organomegaly noted, normoactive bowel sounds. Musculoskeletal: No edema, cyanosis, or clubbing. Neuro: Alert, answering all questions appropriately. Cranial nerves grossly intact. Skin: No rashes or petechiae noted. Psych: Normal affect.   LAB RESULTS:  Lab Results  Component Value Date   NA 137 05/05/2015   K 3.5 05/05/2015   CL 96* 05/05/2015   CO2 32 05/05/2015   GLUCOSE 110* 05/05/2015   BUN 15 05/05/2015   CREATININE 0.71 05/05/2015   CALCIUM 8.7* 05/05/2015   PROT 6.8 02/12/2015   ALBUMIN 3.8 02/12/2015   AST 27 02/12/2015   ALT 17 02/12/2015   ALKPHOS 98 02/12/2015   BILITOT 0.1* 02/12/2015   GFRNONAA >60 05/05/2015   GFRAA >60 05/05/2015    Lab Results  Component Value Date   WBC 10.2 05/05/2015   NEUTROABS 6.9* 05/05/2015   HGB 10.6* 05/05/2015   HCT 34.5* 05/05/2015   MCV 78.0* 05/05/2015   PLT 210 05/05/2015     STUDIES: No results found.  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  PLAN:    1.  Breast cancer: No evidence of disease.  Patient completed 5 years of letrozole in May of 2015. Patient's most recent mammogram in February 2016 was reported as BI-RADS 2. Repeat in February 2017.  She had a bone mineral density on December 28, 2012 her T score of -1.0  is considered normal.  Return to clinic in one year with repeat laboratory work and further evaluation.  2.  Diabetes: Continue current medications as prescribed. 3.  Back pain/joint pain: Treatment per pain clinic and primary neurologist. 4.  Broken left femur: Treatment per orthopedics.   Patient expressed understanding and was in agreement with this plan. She also understands that She can call clinic at any time with any questions, concerns, or complaints.    Lloyd Huger, MD   09/19/2015 8:56 AM

## 2015-09-25 ENCOUNTER — Telehealth: Payer: Self-pay | Admitting: Internal Medicine

## 2015-09-25 MED ORDER — SULFAMETHOXAZOLE-TRIMETHOPRIM 800-160 MG PO TABS: 1.0000 | ORAL_TABLET | Freq: Two times a day (BID) | ORAL | Status: DC

## 2015-09-25 NOTE — Telephone Encounter (Signed)
Spoke with patient and advised results   

## 2015-09-25 NOTE — Telephone Encounter (Signed)
Please let her know that I sent a prescription for her to try for a possible bladder infection. If her symptoms go away with the 1st or 2nd dose, she should only take it for 3 days

## 2015-09-25 NOTE — Telephone Encounter (Signed)
Patient Name: Madeline [name] DOB: Sep 20, 1951 Initial Comment Caller states she UTI symptoms, bedridden due to leg fracture and unable to bear weight, does not have home health or phys therapy, unhappy with being xfered from office for triage Nurse Assessment Nurse: Ronnald Ramp, RN, Miranda Date/Time (Eastern Time): 09/25/2015 9:32:08 AM Confirm and document reason for call. If symptomatic, describe symptoms. ---Caller states she has a broken femur and having symptoms of a UTI. Her urine has an odor and is brown. Also has irritation in her vaginal area. Has the patient traveled out of the country within the last 30 days? ---Not Applicable Does the patient have any new or worsening symptoms? ---Yes Will a triage be completed? ---Yes Related visit to physician within the last 2 weeks? ---No Does the PT have any chronic conditions? (i.e. diabetes, asthma, etc.) ---Yes List chronic conditions. ---MS, Diabetes Guidelines Guideline Title Affirmed Question Affirmed Notes Urination Pain - Female Side (flank) or lower back pain present Final Disposition User See Physician within 4 Hours (or PCP triage) Ronnald Ramp, RN, Miranda Comments Pt states she has no way to get to the office without calling a transport [facility] and they will not take her to the office. She is on bedrest for a broken femur. She is having new lower back pain that started on Sunday, following UTI symptoms that started on Friday. She was triaged to be seen within 4 hours. She is only wanting an antibiotic called in without her being seen. Told caller that I did not feel that would appropriate but would send a message to the doctor for review. She cursed and disconnected the line. Referrals GO TO FACILITY REFUSED Disagree/Comply: Disagree Disagree/Comply Reason: Disagree with instructions

## 2015-10-02 ENCOUNTER — Other Ambulatory Visit: Payer: Self-pay | Admitting: Pain Medicine

## 2015-10-02 ENCOUNTER — Telehealth: Payer: Self-pay | Admitting: Pain Medicine

## 2015-10-02 NOTE — Telephone Encounter (Signed)
Last prescribed by Dr.Crisp on 05-01-15. Attempted to call patient to ask how many she takes per day. No answer.

## 2015-10-02 NOTE — Telephone Encounter (Signed)
Nurses Baclofen has been prescribed as 10 mg with a limit  of  2-3 tablets by mouth per day. I preferred to discuss this at time of patient's appointment Please see if patient will agreement with this plan

## 2015-10-02 NOTE — Telephone Encounter (Signed)
Needs baclofen refilled / has appt next week

## 2015-10-02 NOTE — Telephone Encounter (Signed)
Pt notified that bacloden will be written at next appt

## 2015-10-03 ENCOUNTER — Ambulatory Visit: Payer: Medicare Other | Admitting: Pain Medicine

## 2015-10-08 DIAGNOSIS — J449 Chronic obstructive pulmonary disease, unspecified: Secondary | ICD-10-CM | POA: Diagnosis not present

## 2015-10-09 ENCOUNTER — Ambulatory Visit: Payer: Medicare Other | Attending: Pain Medicine | Admitting: Pain Medicine

## 2015-10-09 ENCOUNTER — Encounter: Payer: Self-pay | Admitting: Pain Medicine

## 2015-10-09 VITALS — BP 125/92 | HR 69 | Temp 98.0°F | Resp 18 | Ht 62.0 in | Wt 270.0 lb

## 2015-10-09 DIAGNOSIS — M542 Cervicalgia: Secondary | ICD-10-CM | POA: Diagnosis present

## 2015-10-09 DIAGNOSIS — E669 Obesity, unspecified: Secondary | ICD-10-CM | POA: Insufficient documentation

## 2015-10-09 DIAGNOSIS — E1140 Type 2 diabetes mellitus with diabetic neuropathy, unspecified: Secondary | ICD-10-CM | POA: Diagnosis not present

## 2015-10-09 DIAGNOSIS — X58XXXA Exposure to other specified factors, initial encounter: Secondary | ICD-10-CM | POA: Diagnosis not present

## 2015-10-09 DIAGNOSIS — Z9889 Other specified postprocedural states: Secondary | ICD-10-CM | POA: Insufficient documentation

## 2015-10-09 DIAGNOSIS — M461 Sacroiliitis, not elsewhere classified: Secondary | ICD-10-CM | POA: Diagnosis not present

## 2015-10-09 DIAGNOSIS — M19019 Primary osteoarthritis, unspecified shoulder: Secondary | ICD-10-CM | POA: Diagnosis not present

## 2015-10-09 DIAGNOSIS — R51 Headache: Secondary | ICD-10-CM | POA: Diagnosis present

## 2015-10-09 DIAGNOSIS — E1049 Type 1 diabetes mellitus with other diabetic neurological complication: Secondary | ICD-10-CM

## 2015-10-09 DIAGNOSIS — L97519 Non-pressure chronic ulcer of other part of right foot with unspecified severity: Secondary | ICD-10-CM

## 2015-10-09 DIAGNOSIS — M791 Myalgia: Secondary | ICD-10-CM | POA: Diagnosis not present

## 2015-10-09 DIAGNOSIS — M5137 Other intervertebral disc degeneration, lumbosacral region: Secondary | ICD-10-CM

## 2015-10-09 DIAGNOSIS — M48062 Spinal stenosis, lumbar region with neurogenic claudication: Secondary | ICD-10-CM

## 2015-10-09 DIAGNOSIS — S72402D Unspecified fracture of lower end of left femur, subsequent encounter for closed fracture with routine healing: Secondary | ICD-10-CM | POA: Insufficient documentation

## 2015-10-09 DIAGNOSIS — L97509 Non-pressure chronic ulcer of other part of unspecified foot with unspecified severity: Secondary | ICD-10-CM

## 2015-10-09 DIAGNOSIS — M5136 Other intervertebral disc degeneration, lumbar region: Secondary | ICD-10-CM | POA: Diagnosis not present

## 2015-10-09 DIAGNOSIS — I739 Peripheral vascular disease, unspecified: Secondary | ICD-10-CM

## 2015-10-09 DIAGNOSIS — M47817 Spondylosis without myelopathy or radiculopathy, lumbosacral region: Secondary | ICD-10-CM | POA: Diagnosis not present

## 2015-10-09 DIAGNOSIS — M47816 Spondylosis without myelopathy or radiculopathy, lumbar region: Secondary | ICD-10-CM | POA: Diagnosis not present

## 2015-10-09 DIAGNOSIS — M25561 Pain in right knee: Secondary | ICD-10-CM

## 2015-10-09 DIAGNOSIS — M5416 Radiculopathy, lumbar region: Secondary | ICD-10-CM | POA: Diagnosis not present

## 2015-10-09 DIAGNOSIS — E1340 Other specified diabetes mellitus with diabetic neuropathy, unspecified: Secondary | ICD-10-CM

## 2015-10-09 DIAGNOSIS — M5030 Other cervical disc degeneration, unspecified cervical region: Secondary | ICD-10-CM

## 2015-10-09 DIAGNOSIS — S7292XD Unspecified fracture of left femur, subsequent encounter for closed fracture with routine healing: Secondary | ICD-10-CM

## 2015-10-09 DIAGNOSIS — E11621 Type 2 diabetes mellitus with foot ulcer: Secondary | ICD-10-CM

## 2015-10-09 MED ORDER — GABAPENTIN 300 MG PO CAPS: ORAL_CAPSULE | ORAL | Status: DC

## 2015-10-09 MED ORDER — OXYCODONE HCL 5 MG PO TABS: ORAL_TABLET | ORAL | Status: DC

## 2015-10-09 MED ORDER — BACLOFEN 10 MG PO TABS: ORAL_TABLET | ORAL | Status: DC

## 2015-10-09 NOTE — Progress Notes (Signed)
Safety precautions to be maintained throughout the outpatient stay will include: orient to surroundings, keep bed in low position, maintain call bell within reach at all times, provide assistance with transfer out of bed and ambulation.  

## 2015-10-09 NOTE — Progress Notes (Signed)
   Subjective:    Patient ID: Madeline [name], female    DOB: 1951/08/04, 64 y.o.   MRN: [id number]  HPI  The patient is a 64 year old female who returns to pain management Center for further evaluation and treatment of pain involving the neck associated with headaches as well as pain involving the upper mid lower back and lower extremity regions. Patient is with prior falls sustaining fracture of the left lower extremity. The patient continues in the care of Dr. Dorothyann Peng of [facility] orthopedic department. The patient is with bone stimulator in place at this time. We discussed patient's overall condition. Patient tolerating medications well. We will avoid interventional treatment as discussed. We remain available to consider modification of treatment regimen pending response to treatment and follow-up evaluation as well as follow-up evaluation with Dr. Dorothyann Peng of orthopedics. The patient was understanding and agreement suggested treatment plan.  Review of Systems     Objective:   Physical Exam There was tenderness to palpation over the splenius capitis and a separate talus musculature regions of mild to moderate degree. There was tends to palpation of the acromioclavicular and glenohumeral joint region a moderate degree right greater than the left. There was limited range of motion of the shoulder. There appeared to be unremarkable Spurling's maneuver. Patient was with decreased grip strength. Tinel and Phalen's maneuver associated with mild discomfort. Palpation over the thoracic facet thoracic paraspinal must reason associated with muscle spasms of the mid and lower thoracic region with no crepitus of the thoracic region noted. Palpation over the lumbar paraspinal musculatures and lumbar facet region was attends to palpation of moderate degree. There was tends to palpation over the PSIS and PII S region as well as the greater trochanteric region and iliotibial band region. Patient was attends  to palpation of the left lower extremity. Palpation of the left lower extremity was with areas of tenderness to palpation without definite allodynia noted. There was tends to palpation of the knees as well is decreased EHL strength with questionable decreased sensation of the lower extremities in a stocking-type distribution. There was negative Homans. EHL strength appeared to be decreased. Abdomen protuberant without excessive tends to palpation and no costovertebral tenderness noted.       Assessment & Plan:     Degenerative disc disease lumbar spine Degenerative changes lumbar spine with multilevel involvement with L4-L5 level most significantly involved Lumbar facet syndrome  Fracture of left lower extremity(recent) Comminuted, oblique intra-articular fracture involving the mid and distal left femur Patient is status post surgical intervention for fracture performed at [facility] Department of orthopedic surgery  Diabetic neuropathy  Obesity  Degenerative joint disease of shoulder    PLAN  Continue present medication Neurontin oxycodone and baclofen  F/U PCP Dr. Silvio Pate  for evaliation of  BP and general medical  condition  F/U surgical evaluation. . Patient will undergo follow-up surgical evaluation with Dr. Dorothyann Peng as discussed . Limited activity as per Dr. Dorothyann Peng   F/U neurological evaluation. May consider pending follow-up evaluations  F/U wound care clinic evaluation  May consider radiofrequency rhizolysis or intraspinal procedures pending response to present treatment and F/U evaluation   Patient to call Pain Management Center should patient have concerns prior to scheduled return appointment.

## 2015-10-09 NOTE — Patient Instructions (Addendum)
  Continue present medication Neurontin oxycodone and baclofen  F/U PCP Dr. Silvio Pate  for evaliation of  BP and general medical  condition  F/U surgical evaluation. . Patient will undergo follow-up surgical evaluation with Dr. Dorothyann Peng as discussed . Limited activity as per Dr. Dorothyann Peng   F/U neurological evaluation. May consider pending follow-up evaluations  F/U wound care clinic evaluation  May consider radiofrequency rhizolysis or intraspinal procedures pending response to present treatment and F/U evaluation   Patient to call Pain Management Center should patient have concerns prior to scheduled return appointment.

## 2015-10-30 DIAGNOSIS — S72401D Unspecified fracture of lower end of right femur, subsequent encounter for closed fracture with routine healing: Secondary | ICD-10-CM | POA: Diagnosis not present

## 2015-10-30 DIAGNOSIS — S72332G Displaced oblique fracture of shaft of left femur, subsequent encounter for closed fracture with delayed healing: Secondary | ICD-10-CM | POA: Diagnosis not present

## 2015-10-30 DIAGNOSIS — M898X5 Other specified disorders of bone, thigh: Secondary | ICD-10-CM | POA: Diagnosis not present

## 2015-10-30 DIAGNOSIS — M79652 Pain in left thigh: Secondary | ICD-10-CM | POA: Diagnosis not present

## 2015-10-31 ENCOUNTER — Ambulatory Visit: Payer: Medicare Other | Admitting: Internal Medicine

## 2015-10-31 ENCOUNTER — Encounter: Payer: Self-pay | Admitting: Internal Medicine

## 2015-10-31 VITALS — BP 126/70 | HR 66 | Resp 14

## 2015-10-31 DIAGNOSIS — G35 Multiple sclerosis: Secondary | ICD-10-CM

## 2015-10-31 DIAGNOSIS — F39 Unspecified mood [affective] disorder: Secondary | ICD-10-CM

## 2015-10-31 DIAGNOSIS — E1140 Type 2 diabetes mellitus with diabetic neuropathy, unspecified: Secondary | ICD-10-CM | POA: Diagnosis not present

## 2015-10-31 DIAGNOSIS — N3000 Acute cystitis without hematuria: Secondary | ICD-10-CM

## 2015-10-31 DIAGNOSIS — Z794 Long term (current) use of insulin: Secondary | ICD-10-CM

## 2015-10-31 DIAGNOSIS — E11610 Type 2 diabetes mellitus with diabetic neuropathic arthropathy: Secondary | ICD-10-CM

## 2015-10-31 DIAGNOSIS — J439 Emphysema, unspecified: Secondary | ICD-10-CM | POA: Diagnosis not present

## 2015-10-31 MED ORDER — CIPROFLOXACIN HCL 250 MG PO TABS: 250.0000 mg | ORAL_TABLET | Freq: Two times a day (BID) | ORAL | Status: DC

## 2015-10-31 NOTE — Progress Notes (Signed)
Subjective:    Patient ID: Madeline [name], female    DOB: 10/11/1951, 64 y.o.   MRN: [id number]  HPI Home visit for follow up of chronic medical problems Just got good news--able to start doing some weight bearing Will be restarting with PT again now Now using bone stimulator--got it last month Still having a lot of pain in the leg Transfers to commode or wheelchair better than before  Recent visit with Dr Primus Bravo No current interventions planned  Checks sugars once in a while Have been running higher---increased lantus back to 71 Can't control diet--has to depend on daughter and grandson to bring her food Knows she has to really work on her weight--has been stuck in bed for months  MS doesn't seem active No dizziness No other issues  Got rash with sulfa Still having burning dysuria and increased frequency Some back pain No fever No N/V  No chest pain---does have some right axillary pain Getting port taken out tomorrow--- at his office No SOB --still uses oxygen at night for sleep apnea  Current Outpatient Prescriptions on File Prior to Visit  Medication Sig Dispense Refill  . albuterol (PROVENTIL) (2.5 MG/3ML) 0.083% nebulizer solution Take 3 mLs (2.5 mg total) by nebulization every 6 (six) hours as needed for wheezing or shortness of breath. 75 mL 11  . allopurinol (ZYLOPRIM) 300 MG tablet Take 1 tablet (300 mg total) by mouth daily. 90 tablet 3  . aspirin 325 MG tablet Take 325 mg by mouth daily.      . baclofen (LIORESAL) 10 MG tablet Limit 1 tablet by mouth twice a day to 3 times a day if tolerated 80 each 0  . budesonide-formoterol (SYMBICORT) 160-4.5 MCG/ACT inhaler Inhale 1 puff into the lungs 2 (two) times daily. 10.2 g 5  . Calcium Carbonate-Vitamin D (CALCIUM-VITAMIN D) 600-200 MG-UNIT CAPS Take by mouth 2 (two) times daily.     . carvedilol (COREG) 25 MG tablet Take 1 tablet (25 mg total) by mouth 2 (two) times daily with a meal. 180 tablet 3  .  Cholecalciferol (VITAMIN D3) 1000 UNITS CAPS Take 4,000 capsules by mouth every morning.    . colchicine 0.6 MG tablet Take 1 tablet (0.6 mg total) by mouth 2 (two) times daily as needed. 60 tablet 3  . COPAXONE 40 MG/ML SOSY Inject 52ml (40mg ) subcutaneously three times a week. 36 Syringe 3  . diazepam (VALIUM) 5 MG tablet Take 1 tablet (5 mg total) by mouth 2 (two) times daily. (Patient taking differently: Take 5 mg by mouth 2 (two) times daily as needed. ) 60 tablet 0  . fish oil-omega-3 fatty acids 1000 MG capsule Take 2 g by mouth daily.      Marland Kitchen FLUoxetine (PROZAC) 20 MG capsule TAKE 2 CAPSULES BY MOUTH EVERY DAY 180 capsule 3  . furosemide (LASIX) 80 MG tablet Take 1 tablet (80 mg total) by mouth daily. Take second dose around lunchtime if your legs are swelling 180 tablet 3  . gabapentin (NEURONTIN) 300 MG capsule Limit 1-2 tablets by mouth 2-4 times per day if tolerated 240 capsule 0  . glucose blood (ONE TOUCH TEST STRIPS) test strip Use as instructed to test blood sugar 3 times daily dx: E11.40 300 each 3  . insulin lispro (HUMALOG) 100 UNIT/ML KiwkPen Use according to sliding scale as directed 15 mL 3  . Insulin Pen Needle 30G X 5 MM MISC Use as instructed to inject insulin 2-3 times daily dx 250.60 300  each 6  . LANTUS SOLOSTAR 100 UNIT/ML Solostar Pen Inject 47 Units into the skin 2 (two) times daily.     Marland Kitchen levothyroxine (SYNTHROID, LEVOTHROID) 175 MCG tablet Take 1 tablet (175 mcg total) by mouth daily. 90 tablet 3  . meloxicam (MOBIC) 15 MG tablet Take 1 tablet (15 mg total) by mouth daily as needed. 90 tablet 3  . metFORMIN (GLUCOPHAGE) 1000 MG tablet Take 1 tablet (1,000 mg total) by mouth daily with breakfast. 90 tablet 3  . omeprazole (PRILOSEC) 20 MG capsule TAKE ONE CAPSULE BY MOUTH TWICE DAILY 180 capsule 3  . ONETOUCH DELICA LANCETS 99991111 MISC Use to test blood sugar three times daily dx: E11.40 300 each 0  . oxyCODONE (OXY IR/ROXICODONE) 5 MG immediate release tablet Limit 1  tablet by mouth 2-4 times per day if tolerated 120 tablet 0  . potassium chloride (KLOR-CON M10) 10 MEQ tablet TAKE 2 TABLETS BY MOUTH EVERY DAY 180 tablet 3  . tiotropium (SPIRIVA HANDIHALER) 18 MCG inhalation capsule PLACE 1 CAPSULE (18 MCG TOTAL) INTO INHALER AND INHALE DAILY AS NEEDED. 90 capsule 3  . traZODone (DESYREL) 100 MG tablet Take 2 tablets (200 mg total) by mouth at bedtime. 180 tablet 3   No current facility-administered medications on file prior to visit.    Allergies  Allergen Reactions  . Penicillins Swelling    tongue and throat swelling tolerates cephalosporins  . Sulfa Antibiotics Hives and Itching  . Trovan [Alatrofloxacin] Hives  . Rosiglitazone Maleate Swelling    Past Medical History  Diagnosis Date  . Depression   . Goiter, nodular     multi  . Dyslipidemia   . NIDDM, uncontrolled, with neuropathy   . Hypertension   . Allergy   . GERD (gastroesophageal reflux disease)   . Urinary incontinence   . Peripheral neuropathy ([place])   . Multiple sclerosis ([place])   . Breast cancer ([place])   . Hypothyroidism   . Asthma   . COPD (chronic obstructive pulmonary disease) ([place])   . DVT (deep venous thrombosis) ([facility]) 1990's    right leg  . Gout     Past Surgical History  Procedure Laterality Date  . Thyroidectomy  09/2004  . Transthoracic echocardiogram  05/16/2004  . Partial hysterectomy  1975  . Axillary hidradenitis excision  1993    Excision biopsy growth right axilla, benign   . Other surgical history      Thyroid biopsy 10/99  . Carpal tunnel release  03/2008    bilateral  . Mastectomy, radical  02/2009    left modified  . Tonsillectomy    . Appendectomy    . Femur fracture surgery Left june 2016    Family History  Problem Relation Age of Onset  . Kidney failure Mother   . Heart disease Father 35  . Heart disease Brother   . Heart disease Brother     Social History   Social History  . Marital Status: Widowed    Spouse Name: N/A  .  Number of Children: 4  . 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 Smoking status: Former Smoker -- 1.00 packs/day for 50 years    Types: Cigarettes    Quit date: 07/12/2015  . Smokeless tobacco: Never Used     Comment: almost stopped  . Alcohol Use: No  . Drug Use: No  .  Sexual Activity: Not on file   Other Topics Concern  . Not on file   Social History Narrative   Widowed 1999 then 2nd Marriage--2000. Widowed again 2009   Living with daughter now   Has living will   Daughter [name] is health care POA.   Would accept rescitation but no prolonged artificial ventilation   No feeding tube if cognitively unaware   Review of Systems Sleeps okay Bowels are fine-- regularly twice a day No cough or wheezing    Objective:   Physical Exam  Constitutional: She appears well-nourished. No distress.  Neck: Normal range of motion. Neck supple. No JVD present. No thyromegaly present.  Cardiovascular: Normal rate, regular rhythm and normal heart sounds.  Exam reveals no gallop.   No murmur heard. Faint pedal pulses  Pulmonary/Chest: Effort normal and breath sounds normal. No respiratory distress. She has no wheezes. She has no rales.  Abdominal: Soft. There is no tenderness.  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  Neurological:  Better bed mobility but still limited  Psychiatric: She has a normal mood and affect. Her behavior is normal.          Assessment & Plan:

## 2015-10-31 NOTE — Assessment & Plan Note (Signed)
Well controlled now on her inhalers

## 2015-10-31 NOTE — Assessment & Plan Note (Signed)
Frustrated by bed bound status but staying upbeat Continue the fluoxetine

## 2015-10-31 NOTE — Assessment & Plan Note (Signed)
Hopefully still okay control Will recheck labs when she comes back into the office (hopefully next time)

## 2015-10-31 NOTE — Assessment & Plan Note (Signed)
Recurrent bladder symptoms Will try cipro (allergic to sulfa)

## 2015-10-31 NOTE — Assessment & Plan Note (Signed)
No pain now in right foot

## 2015-10-31 NOTE — Assessment & Plan Note (Signed)
Seems stable on the copaxone

## 2015-11-01 DIAGNOSIS — Z853 Personal history of malignant neoplasm of breast: Secondary | ICD-10-CM | POA: Diagnosis not present

## 2015-11-02 DIAGNOSIS — M109 Gout, unspecified: Secondary | ICD-10-CM | POA: Diagnosis not present

## 2015-11-02 DIAGNOSIS — E1121 Type 2 diabetes mellitus with diabetic nephropathy: Secondary | ICD-10-CM | POA: Diagnosis not present

## 2015-11-02 DIAGNOSIS — S7292XS Unspecified fracture of left femur, sequela: Secondary | ICD-10-CM | POA: Diagnosis not present

## 2015-11-02 DIAGNOSIS — A5216 Charcot's arthropathy (tabetic): Secondary | ICD-10-CM | POA: Diagnosis not present

## 2015-11-02 DIAGNOSIS — G35 Multiple sclerosis: Secondary | ICD-10-CM | POA: Diagnosis not present

## 2015-11-02 DIAGNOSIS — S72332D Displaced oblique fracture of shaft of left femur, subsequent encounter for closed fracture with routine healing: Secondary | ICD-10-CM | POA: Diagnosis not present

## 2015-11-02 DIAGNOSIS — J449 Chronic obstructive pulmonary disease, unspecified: Secondary | ICD-10-CM | POA: Diagnosis not present

## 2015-11-07 DIAGNOSIS — S72332D Displaced oblique fracture of shaft of left femur, subsequent encounter for closed fracture with routine healing: Secondary | ICD-10-CM | POA: Diagnosis not present

## 2015-11-07 DIAGNOSIS — S7292XS Unspecified fracture of left femur, sequela: Secondary | ICD-10-CM | POA: Diagnosis not present

## 2015-11-07 DIAGNOSIS — A5216 Charcot's arthropathy (tabetic): Secondary | ICD-10-CM | POA: Diagnosis not present

## 2015-11-07 DIAGNOSIS — E1121 Type 2 diabetes mellitus with diabetic nephropathy: Secondary | ICD-10-CM | POA: Diagnosis not present

## 2015-11-07 DIAGNOSIS — G35 Multiple sclerosis: Secondary | ICD-10-CM | POA: Diagnosis not present

## 2015-11-07 DIAGNOSIS — M109 Gout, unspecified: Secondary | ICD-10-CM | POA: Diagnosis not present

## 2015-11-07 DIAGNOSIS — J449 Chronic obstructive pulmonary disease, unspecified: Secondary | ICD-10-CM | POA: Diagnosis not present

## 2015-11-08 ENCOUNTER — Encounter: Payer: Self-pay | Admitting: Pain Medicine

## 2015-11-08 ENCOUNTER — Ambulatory Visit: Payer: Medicare Other | Attending: Pain Medicine | Admitting: Pain Medicine

## 2015-11-08 VITALS — BP 126/60 | HR 66 | Temp 97.9°F | Resp 16 | Ht 62.0 in | Wt 270.0 lb

## 2015-11-08 DIAGNOSIS — E1140 Type 2 diabetes mellitus with diabetic neuropathy, unspecified: Secondary | ICD-10-CM | POA: Insufficient documentation

## 2015-11-08 DIAGNOSIS — M5030 Other cervical disc degeneration, unspecified cervical region: Secondary | ICD-10-CM

## 2015-11-08 DIAGNOSIS — E1049 Type 1 diabetes mellitus with other diabetic neurological complication: Secondary | ICD-10-CM

## 2015-11-08 DIAGNOSIS — M19019 Primary osteoarthritis, unspecified shoulder: Secondary | ICD-10-CM | POA: Insufficient documentation

## 2015-11-08 DIAGNOSIS — X58XXXA Exposure to other specified factors, initial encounter: Secondary | ICD-10-CM | POA: Insufficient documentation

## 2015-11-08 DIAGNOSIS — E11621 Type 2 diabetes mellitus with foot ulcer: Secondary | ICD-10-CM

## 2015-11-08 DIAGNOSIS — M25561 Pain in right knee: Secondary | ICD-10-CM

## 2015-11-08 DIAGNOSIS — I739 Peripheral vascular disease, unspecified: Secondary | ICD-10-CM

## 2015-11-08 DIAGNOSIS — S72332D Displaced oblique fracture of shaft of left femur, subsequent encounter for closed fracture with routine healing: Secondary | ICD-10-CM | POA: Diagnosis not present

## 2015-11-08 DIAGNOSIS — M791 Myalgia: Secondary | ICD-10-CM | POA: Diagnosis not present

## 2015-11-08 DIAGNOSIS — F1120 Opioid dependence, uncomplicated: Secondary | ICD-10-CM | POA: Diagnosis not present

## 2015-11-08 DIAGNOSIS — Z5181 Encounter for therapeutic drug level monitoring: Secondary | ICD-10-CM | POA: Diagnosis not present

## 2015-11-08 DIAGNOSIS — S7292XD Unspecified fracture of left femur, subsequent encounter for closed fracture with routine healing: Secondary | ICD-10-CM

## 2015-11-08 DIAGNOSIS — S72402D Unspecified fracture of lower end of left femur, subsequent encounter for closed fracture with routine healing: Secondary | ICD-10-CM | POA: Diagnosis not present

## 2015-11-08 DIAGNOSIS — E1340 Other specified diabetes mellitus with diabetic neuropathy, unspecified: Secondary | ICD-10-CM

## 2015-11-08 DIAGNOSIS — E669 Obesity, unspecified: Secondary | ICD-10-CM | POA: Insufficient documentation

## 2015-11-08 DIAGNOSIS — Z79899 Other long term (current) drug therapy: Secondary | ICD-10-CM | POA: Diagnosis not present

## 2015-11-08 DIAGNOSIS — M109 Gout, unspecified: Secondary | ICD-10-CM | POA: Diagnosis not present

## 2015-11-08 DIAGNOSIS — M48062 Spinal stenosis, lumbar region with neurogenic claudication: Secondary | ICD-10-CM

## 2015-11-08 DIAGNOSIS — M47816 Spondylosis without myelopathy or radiculopathy, lumbar region: Secondary | ICD-10-CM | POA: Insufficient documentation

## 2015-11-08 DIAGNOSIS — E1121 Type 2 diabetes mellitus with diabetic nephropathy: Secondary | ICD-10-CM | POA: Diagnosis not present

## 2015-11-08 DIAGNOSIS — G35 Multiple sclerosis: Secondary | ICD-10-CM | POA: Diagnosis not present

## 2015-11-08 DIAGNOSIS — M549 Dorsalgia, unspecified: Secondary | ICD-10-CM | POA: Diagnosis present

## 2015-11-08 DIAGNOSIS — J449 Chronic obstructive pulmonary disease, unspecified: Secondary | ICD-10-CM | POA: Diagnosis not present

## 2015-11-08 DIAGNOSIS — L97519 Non-pressure chronic ulcer of other part of right foot with unspecified severity: Secondary | ICD-10-CM

## 2015-11-08 DIAGNOSIS — M5416 Radiculopathy, lumbar region: Secondary | ICD-10-CM | POA: Diagnosis not present

## 2015-11-08 DIAGNOSIS — M461 Sacroiliitis, not elsewhere classified: Secondary | ICD-10-CM | POA: Diagnosis not present

## 2015-11-08 DIAGNOSIS — M47817 Spondylosis without myelopathy or radiculopathy, lumbosacral region: Secondary | ICD-10-CM | POA: Diagnosis not present

## 2015-11-08 DIAGNOSIS — M542 Cervicalgia: Secondary | ICD-10-CM | POA: Diagnosis present

## 2015-11-08 DIAGNOSIS — L97509 Non-pressure chronic ulcer of other part of unspecified foot with unspecified severity: Secondary | ICD-10-CM

## 2015-11-08 DIAGNOSIS — M5137 Other intervertebral disc degeneration, lumbosacral region: Secondary | ICD-10-CM

## 2015-11-08 DIAGNOSIS — M5136 Other intervertebral disc degeneration, lumbar region: Secondary | ICD-10-CM | POA: Diagnosis not present

## 2015-11-08 DIAGNOSIS — S7292XS Unspecified fracture of left femur, sequela: Secondary | ICD-10-CM | POA: Diagnosis not present

## 2015-11-08 DIAGNOSIS — Z9889 Other specified postprocedural states: Secondary | ICD-10-CM | POA: Diagnosis not present

## 2015-11-08 DIAGNOSIS — Z0389 Encounter for observation for other suspected diseases and conditions ruled out: Secondary | ICD-10-CM | POA: Diagnosis not present

## 2015-11-08 DIAGNOSIS — A5216 Charcot's arthropathy (tabetic): Secondary | ICD-10-CM | POA: Diagnosis not present

## 2015-11-08 MED ORDER — OXYCODONE HCL 5 MG PO TABS: ORAL_TABLET | ORAL | Status: DC

## 2015-11-08 MED ORDER — BACLOFEN 10 MG PO TABS: ORAL_TABLET | ORAL | Status: DC

## 2015-11-08 MED ORDER — GABAPENTIN 300 MG PO CAPS: ORAL_CAPSULE | ORAL | Status: DC

## 2015-11-08 NOTE — Progress Notes (Signed)
Safety precautions to be maintained throughout the outpatient stay will include: orient to surroundings, keep bed in low position, maintain call bell within reach at all times, provide assistance with transfer out of bed and ambulation.  

## 2015-11-08 NOTE — Patient Instructions (Addendum)
Continue present medication Neurontin oxycodone and baclofen  F/U PCP Dr. Silvio Pate  for evaliation of  BP diabetes mellitus fracture of lower extremity and general medical  condition  F/U surgical evaluation. . Patient will undergo follow-up surgical evaluation with Dr. Dorothyann Peng as discussed . Limited activity as per Dr. Dorothyann Peng   F/U neurological evaluation. May consider pending follow-up evaluations  Continue physical therapy as presently doing and avoid aggravation of symptoms  F/U wound care clinic evaluation  May consider radiofrequency rhizolysis or intraspinal procedures pending response to present treatment and F/U evaluation   Patient to call [facility] should patient have concerns prior to scheduled return appointment.

## 2015-11-08 NOTE — Progress Notes (Signed)
   Subjective:    Patient ID: Madeline [name], female    DOB: 04-Nov-1951, 64 y.o.   MRN: [id number]  HPI  The patient is a 64 year old female who returns to pain management for further evaluation and treatment of pain involving the neck and shoulder entire back upper and lower extremity regions. The patient is status post recent fracture of the left lower extremity and continuing under the care of Dr. Dorothyann Peng orthopedic department [facility]. The patient finally was approved for bone stimulator and states that she has had evidence of bone growth in the region of the prior fracture. The patient will continue present medication Neurontin and oxycodone as well as baclofen and we will avoid interventional treatment. The patient is without any other trauma change in events of daily living the call significant change in symptomatology. The patient will continue presently prescribed medications and will also engaging physical therapy with caution to avoid aggravation of symptoms. The patient is in agreement with suggested treatment plan.     Review of Systems     Objective:   Physical Exam  There was tends to palpation of the splenius capitis and occipitalis musculature regions. Palpation of these regions reproduces moderate discomfort on the left greater than the right. There appeared to be unremarkable Spurling's maneuver and Tinel and Phalen's maneuver were associated with mild to moderate discomfort. Grip strength appeared to be slightly decreased. Palpation of the cervical facet cervical paraspinal musculature region as well as the lumbar paraspinal musculature region was attends to palpation of moderate degree. No crepitus of the thoracic region noted. Palpation over the lumbar paraspinal musculature region lumbar facet region was attends to palpation of moderate degree with tenderness to palpation of the PSIS PII S region as well as the gluteal and piriformis musculature regions  a moderately severe degree. Straight leg raising was tolerates approximately 20 without increased pain with dorsiflexion noted. Decreased EHL strength on the left compared to the right. There is of hypersensitivity to touch of the left lower extremity without definite allodynia noted. Palpation over the greater trochanteric region iliotibial band region reproduced moderate discomfort. There was negative clonus negative Homans. Abdomen was nontender with no costovertebral tenderness noted      Assessment & Plan:    Degenerative disc disease lumbar spine Degenerative changes lumbar spine with multilevel involvement with L4-L5 level most significantly involved Lumbar facet syndrome  Fracture of left lower extremity(recent) Comminuted, oblique intra-articular fracture involving the mid and distal left femur Patient is status post surgical intervention for fracture performed at [facility] Department of orthopedic surgery  Diabetic neuropathy  Obesity  Degenerative joint disease of shoulder   PLAN  Continue present medication Neurontin oxycodone and baclofen  F/U PCP Dr. Silvio Pate  for evaliation of  BP diabetes mellitus fracture of lower extremity and general medical  condition  F/U surgical evaluation. . Patient will undergo follow-up surgical evaluation with Dr. Dorothyann Peng as discussed . Limited activity as per Dr. Dorothyann Peng . Patient with evidence of new bone growth since insurance finally approved patient for bone stimulator which have been requested for several months  F/U neurological evaluation. May consider pending follow-up evaluations  Continue physical therapy as presently doing and avoid aggravation of symptoms  F/U wound care clinic evaluation  May consider radiofrequency rhizolysis or intraspinal procedures pending response to present treatment and F/U evaluation   Patient to call Pain Management Center should patient have concerns prior to scheduled return  appointment.

## 2015-11-09 DIAGNOSIS — E1121 Type 2 diabetes mellitus with diabetic nephropathy: Secondary | ICD-10-CM | POA: Diagnosis not present

## 2015-11-09 DIAGNOSIS — G35 Multiple sclerosis: Secondary | ICD-10-CM | POA: Diagnosis not present

## 2015-11-09 DIAGNOSIS — S72332D Displaced oblique fracture of shaft of left femur, subsequent encounter for closed fracture with routine healing: Secondary | ICD-10-CM | POA: Diagnosis not present

## 2015-11-09 DIAGNOSIS — S7292XS Unspecified fracture of left femur, sequela: Secondary | ICD-10-CM | POA: Diagnosis not present

## 2015-11-09 DIAGNOSIS — M109 Gout, unspecified: Secondary | ICD-10-CM | POA: Diagnosis not present

## 2015-11-09 DIAGNOSIS — J449 Chronic obstructive pulmonary disease, unspecified: Secondary | ICD-10-CM | POA: Diagnosis not present

## 2015-11-09 DIAGNOSIS — A5216 Charcot's arthropathy (tabetic): Secondary | ICD-10-CM | POA: Diagnosis not present

## 2015-11-13 DIAGNOSIS — M109 Gout, unspecified: Secondary | ICD-10-CM | POA: Diagnosis not present

## 2015-11-13 DIAGNOSIS — G35 Multiple sclerosis: Secondary | ICD-10-CM | POA: Diagnosis not present

## 2015-11-13 DIAGNOSIS — S72332D Displaced oblique fracture of shaft of left femur, subsequent encounter for closed fracture with routine healing: Secondary | ICD-10-CM | POA: Diagnosis not present

## 2015-11-13 DIAGNOSIS — S7292XS Unspecified fracture of left femur, sequela: Secondary | ICD-10-CM | POA: Diagnosis not present

## 2015-11-13 DIAGNOSIS — J449 Chronic obstructive pulmonary disease, unspecified: Secondary | ICD-10-CM | POA: Diagnosis not present

## 2015-11-13 DIAGNOSIS — E1121 Type 2 diabetes mellitus with diabetic nephropathy: Secondary | ICD-10-CM | POA: Diagnosis not present

## 2015-11-14 DIAGNOSIS — M109 Gout, unspecified: Secondary | ICD-10-CM | POA: Diagnosis not present

## 2015-11-14 DIAGNOSIS — S7292XS Unspecified fracture of left femur, sequela: Secondary | ICD-10-CM | POA: Diagnosis not present

## 2015-11-14 DIAGNOSIS — G35 Multiple sclerosis: Secondary | ICD-10-CM | POA: Diagnosis not present

## 2015-11-14 DIAGNOSIS — J449 Chronic obstructive pulmonary disease, unspecified: Secondary | ICD-10-CM | POA: Diagnosis not present

## 2015-11-14 DIAGNOSIS — S72332D Displaced oblique fracture of shaft of left femur, subsequent encounter for closed fracture with routine healing: Secondary | ICD-10-CM | POA: Diagnosis not present

## 2015-11-14 DIAGNOSIS — E1121 Type 2 diabetes mellitus with diabetic nephropathy: Secondary | ICD-10-CM | POA: Diagnosis not present

## 2015-11-15 DIAGNOSIS — M109 Gout, unspecified: Secondary | ICD-10-CM | POA: Diagnosis not present

## 2015-11-15 DIAGNOSIS — S72332D Displaced oblique fracture of shaft of left femur, subsequent encounter for closed fracture with routine healing: Secondary | ICD-10-CM | POA: Diagnosis not present

## 2015-11-15 DIAGNOSIS — S7292XS Unspecified fracture of left femur, sequela: Secondary | ICD-10-CM | POA: Diagnosis not present

## 2015-11-15 DIAGNOSIS — G35 Multiple sclerosis: Secondary | ICD-10-CM | POA: Diagnosis not present

## 2015-11-15 DIAGNOSIS — J449 Chronic obstructive pulmonary disease, unspecified: Secondary | ICD-10-CM | POA: Diagnosis not present

## 2015-11-15 DIAGNOSIS — E1121 Type 2 diabetes mellitus with diabetic nephropathy: Secondary | ICD-10-CM | POA: Diagnosis not present

## 2015-11-16 ENCOUNTER — Telehealth: Payer: Self-pay

## 2015-11-16 ENCOUNTER — Encounter: Payer: Self-pay | Admitting: Internal Medicine

## 2015-11-16 NOTE — Telephone Encounter (Signed)
Okay to give verbal order as requested.

## 2015-11-16 NOTE — Telephone Encounter (Signed)
Madeline [name] PT with [facility] left v/m requesting verbal order for home care social worker to go out to talk with pt about pt issues at home and getting pt assistance.

## 2015-11-16 NOTE — Telephone Encounter (Signed)
Left msg for verbal order on VM with results

## 2015-11-20 DIAGNOSIS — J449 Chronic obstructive pulmonary disease, unspecified: Secondary | ICD-10-CM | POA: Diagnosis not present

## 2015-11-20 DIAGNOSIS — S72332D Displaced oblique fracture of shaft of left femur, subsequent encounter for closed fracture with routine healing: Secondary | ICD-10-CM | POA: Diagnosis not present

## 2015-11-20 DIAGNOSIS — S7292XS Unspecified fracture of left femur, sequela: Secondary | ICD-10-CM | POA: Diagnosis not present

## 2015-11-20 DIAGNOSIS — E1121 Type 2 diabetes mellitus with diabetic nephropathy: Secondary | ICD-10-CM | POA: Diagnosis not present

## 2015-11-20 DIAGNOSIS — G35 Multiple sclerosis: Secondary | ICD-10-CM | POA: Diagnosis not present

## 2015-11-20 DIAGNOSIS — M109 Gout, unspecified: Secondary | ICD-10-CM | POA: Diagnosis not present

## 2015-11-21 DIAGNOSIS — S72332D Displaced oblique fracture of shaft of left femur, subsequent encounter for closed fracture with routine healing: Secondary | ICD-10-CM | POA: Diagnosis not present

## 2015-11-21 DIAGNOSIS — J449 Chronic obstructive pulmonary disease, unspecified: Secondary | ICD-10-CM | POA: Diagnosis not present

## 2015-11-21 DIAGNOSIS — G35 Multiple sclerosis: Secondary | ICD-10-CM | POA: Diagnosis not present

## 2015-11-21 DIAGNOSIS — M109 Gout, unspecified: Secondary | ICD-10-CM | POA: Diagnosis not present

## 2015-11-21 DIAGNOSIS — E1121 Type 2 diabetes mellitus with diabetic nephropathy: Secondary | ICD-10-CM | POA: Diagnosis not present

## 2015-11-21 DIAGNOSIS — S7292XS Unspecified fracture of left femur, sequela: Secondary | ICD-10-CM | POA: Diagnosis not present

## 2015-11-22 DIAGNOSIS — G35 Multiple sclerosis: Secondary | ICD-10-CM | POA: Diagnosis not present

## 2015-11-22 DIAGNOSIS — E1121 Type 2 diabetes mellitus with diabetic nephropathy: Secondary | ICD-10-CM | POA: Diagnosis not present

## 2015-11-22 DIAGNOSIS — S7292XS Unspecified fracture of left femur, sequela: Secondary | ICD-10-CM | POA: Diagnosis not present

## 2015-11-22 DIAGNOSIS — S72332D Displaced oblique fracture of shaft of left femur, subsequent encounter for closed fracture with routine healing: Secondary | ICD-10-CM | POA: Diagnosis not present

## 2015-11-22 DIAGNOSIS — M109 Gout, unspecified: Secondary | ICD-10-CM | POA: Diagnosis not present

## 2015-11-22 DIAGNOSIS — J449 Chronic obstructive pulmonary disease, unspecified: Secondary | ICD-10-CM | POA: Diagnosis not present

## 2015-11-23 ENCOUNTER — Telehealth: Payer: Self-pay

## 2015-11-23 DIAGNOSIS — G35 Multiple sclerosis: Secondary | ICD-10-CM | POA: Diagnosis not present

## 2015-11-23 DIAGNOSIS — M109 Gout, unspecified: Secondary | ICD-10-CM | POA: Diagnosis not present

## 2015-11-23 DIAGNOSIS — J449 Chronic obstructive pulmonary disease, unspecified: Secondary | ICD-10-CM | POA: Diagnosis not present

## 2015-11-23 DIAGNOSIS — E1121 Type 2 diabetes mellitus with diabetic nephropathy: Secondary | ICD-10-CM | POA: Diagnosis not present

## 2015-11-23 DIAGNOSIS — S7292XS Unspecified fracture of left femur, sequela: Secondary | ICD-10-CM | POA: Diagnosis not present

## 2015-11-23 DIAGNOSIS — S72332D Displaced oblique fracture of shaft of left femur, subsequent encounter for closed fracture with routine healing: Secondary | ICD-10-CM | POA: Diagnosis not present

## 2015-11-23 NOTE — Telephone Encounter (Signed)
She needs a CXR at her next appt Find out if she is coming into the office or if she still needs home visits.  If she has fever, increased blood, etc---it would need ER evaluation Many people who cough a little blood, especially in the AM, it actually comes from the nose----but we should at least check a CXR

## 2015-11-23 NOTE — Telephone Encounter (Signed)
Anderson Malta PT with [facility] left v/m; Anderson Malta saw pt on 11/22/15 and pt advised when coughing pt coughed up small amt of blood x 1; pt said has never happened before; pt smoking E cigarettes.Please advise.

## 2015-11-23 NOTE — Telephone Encounter (Signed)
Nurse called back and I advised results, she states it was not a lot of blood, pt is able to walk now and she will have her all for a office visit.

## 2015-11-23 NOTE — Telephone Encounter (Signed)
.  left message to have patient return my call.  

## 2015-11-26 ENCOUNTER — Other Ambulatory Visit: Payer: Self-pay | Admitting: Pain Medicine

## 2015-11-26 DIAGNOSIS — S7292XS Unspecified fracture of left femur, sequela: Secondary | ICD-10-CM | POA: Diagnosis not present

## 2015-11-26 DIAGNOSIS — M109 Gout, unspecified: Secondary | ICD-10-CM | POA: Diagnosis not present

## 2015-11-26 DIAGNOSIS — G35 Multiple sclerosis: Secondary | ICD-10-CM | POA: Diagnosis not present

## 2015-11-26 DIAGNOSIS — E1121 Type 2 diabetes mellitus with diabetic nephropathy: Secondary | ICD-10-CM | POA: Diagnosis not present

## 2015-11-26 DIAGNOSIS — J449 Chronic obstructive pulmonary disease, unspecified: Secondary | ICD-10-CM | POA: Diagnosis not present

## 2015-11-26 DIAGNOSIS — S72332D Displaced oblique fracture of shaft of left femur, subsequent encounter for closed fracture with routine healing: Secondary | ICD-10-CM | POA: Diagnosis not present

## 2015-11-27 ENCOUNTER — Encounter: Payer: Self-pay | Admitting: Internal Medicine

## 2015-11-27 DIAGNOSIS — S7292XS Unspecified fracture of left femur, sequela: Secondary | ICD-10-CM | POA: Diagnosis not present

## 2015-11-27 DIAGNOSIS — M109 Gout, unspecified: Secondary | ICD-10-CM | POA: Diagnosis not present

## 2015-11-27 DIAGNOSIS — E1121 Type 2 diabetes mellitus with diabetic nephropathy: Secondary | ICD-10-CM | POA: Diagnosis not present

## 2015-11-27 DIAGNOSIS — G35 Multiple sclerosis: Secondary | ICD-10-CM | POA: Diagnosis not present

## 2015-11-27 DIAGNOSIS — J449 Chronic obstructive pulmonary disease, unspecified: Secondary | ICD-10-CM | POA: Diagnosis not present

## 2015-11-27 DIAGNOSIS — S72332D Displaced oblique fracture of shaft of left femur, subsequent encounter for closed fracture with routine healing: Secondary | ICD-10-CM | POA: Diagnosis not present

## 2015-11-28 DIAGNOSIS — S72332D Displaced oblique fracture of shaft of left femur, subsequent encounter for closed fracture with routine healing: Secondary | ICD-10-CM | POA: Diagnosis not present

## 2015-11-28 DIAGNOSIS — G35 Multiple sclerosis: Secondary | ICD-10-CM | POA: Diagnosis not present

## 2015-11-28 DIAGNOSIS — J449 Chronic obstructive pulmonary disease, unspecified: Secondary | ICD-10-CM | POA: Diagnosis not present

## 2015-11-28 DIAGNOSIS — E1121 Type 2 diabetes mellitus with diabetic nephropathy: Secondary | ICD-10-CM | POA: Diagnosis not present

## 2015-11-28 DIAGNOSIS — M109 Gout, unspecified: Secondary | ICD-10-CM | POA: Diagnosis not present

## 2015-11-28 DIAGNOSIS — S7292XS Unspecified fracture of left femur, sequela: Secondary | ICD-10-CM | POA: Diagnosis not present

## 2015-11-29 DIAGNOSIS — J449 Chronic obstructive pulmonary disease, unspecified: Secondary | ICD-10-CM | POA: Diagnosis not present

## 2015-11-29 DIAGNOSIS — S72332D Displaced oblique fracture of shaft of left femur, subsequent encounter for closed fracture with routine healing: Secondary | ICD-10-CM | POA: Diagnosis not present

## 2015-11-29 DIAGNOSIS — S7292XS Unspecified fracture of left femur, sequela: Secondary | ICD-10-CM | POA: Diagnosis not present

## 2015-11-29 DIAGNOSIS — G35 Multiple sclerosis: Secondary | ICD-10-CM | POA: Diagnosis not present

## 2015-11-29 DIAGNOSIS — M109 Gout, unspecified: Secondary | ICD-10-CM | POA: Diagnosis not present

## 2015-11-29 DIAGNOSIS — E1121 Type 2 diabetes mellitus with diabetic nephropathy: Secondary | ICD-10-CM | POA: Diagnosis not present

## 2015-11-30 DIAGNOSIS — G35 Multiple sclerosis: Secondary | ICD-10-CM | POA: Diagnosis not present

## 2015-11-30 DIAGNOSIS — S7292XS Unspecified fracture of left femur, sequela: Secondary | ICD-10-CM | POA: Diagnosis not present

## 2015-11-30 DIAGNOSIS — M109 Gout, unspecified: Secondary | ICD-10-CM | POA: Diagnosis not present

## 2015-11-30 DIAGNOSIS — S72332D Displaced oblique fracture of shaft of left femur, subsequent encounter for closed fracture with routine healing: Secondary | ICD-10-CM | POA: Diagnosis not present

## 2015-11-30 DIAGNOSIS — E1121 Type 2 diabetes mellitus with diabetic nephropathy: Secondary | ICD-10-CM | POA: Diagnosis not present

## 2015-11-30 DIAGNOSIS — J449 Chronic obstructive pulmonary disease, unspecified: Secondary | ICD-10-CM | POA: Diagnosis not present

## 2015-12-03 DIAGNOSIS — M109 Gout, unspecified: Secondary | ICD-10-CM | POA: Diagnosis not present

## 2015-12-03 DIAGNOSIS — S72332D Displaced oblique fracture of shaft of left femur, subsequent encounter for closed fracture with routine healing: Secondary | ICD-10-CM | POA: Diagnosis not present

## 2015-12-03 DIAGNOSIS — J449 Chronic obstructive pulmonary disease, unspecified: Secondary | ICD-10-CM | POA: Diagnosis not present

## 2015-12-03 DIAGNOSIS — G35 Multiple sclerosis: Secondary | ICD-10-CM | POA: Diagnosis not present

## 2015-12-03 DIAGNOSIS — S7292XS Unspecified fracture of left femur, sequela: Secondary | ICD-10-CM | POA: Diagnosis not present

## 2015-12-03 DIAGNOSIS — E1121 Type 2 diabetes mellitus with diabetic nephropathy: Secondary | ICD-10-CM | POA: Diagnosis not present

## 2015-12-04 DIAGNOSIS — G35 Multiple sclerosis: Secondary | ICD-10-CM | POA: Diagnosis not present

## 2015-12-04 DIAGNOSIS — M109 Gout, unspecified: Secondary | ICD-10-CM | POA: Diagnosis not present

## 2015-12-04 DIAGNOSIS — J449 Chronic obstructive pulmonary disease, unspecified: Secondary | ICD-10-CM | POA: Diagnosis not present

## 2015-12-04 DIAGNOSIS — S72332D Displaced oblique fracture of shaft of left femur, subsequent encounter for closed fracture with routine healing: Secondary | ICD-10-CM | POA: Diagnosis not present

## 2015-12-04 DIAGNOSIS — E1121 Type 2 diabetes mellitus with diabetic nephropathy: Secondary | ICD-10-CM | POA: Diagnosis not present

## 2015-12-04 DIAGNOSIS — S7292XS Unspecified fracture of left femur, sequela: Secondary | ICD-10-CM | POA: Diagnosis not present

## 2015-12-06 ENCOUNTER — Encounter: Payer: Self-pay | Admitting: Pain Medicine

## 2015-12-06 ENCOUNTER — Ambulatory Visit: Payer: Medicare Other | Attending: Pain Medicine | Admitting: Pain Medicine

## 2015-12-06 VITALS — BP 108/68 | HR 72 | Temp 98.3°F | Resp 16 | Ht 62.0 in | Wt 270.0 lb

## 2015-12-06 DIAGNOSIS — Z9889 Other specified postprocedural states: Secondary | ICD-10-CM | POA: Diagnosis not present

## 2015-12-06 DIAGNOSIS — M48062 Spinal stenosis, lumbar region with neurogenic claudication: Secondary | ICD-10-CM

## 2015-12-06 DIAGNOSIS — M5136 Other intervertebral disc degeneration, lumbar region: Secondary | ICD-10-CM | POA: Diagnosis not present

## 2015-12-06 DIAGNOSIS — S7292XD Unspecified fracture of left femur, subsequent encounter for closed fracture with routine healing: Secondary | ICD-10-CM

## 2015-12-06 DIAGNOSIS — X58XXXA Exposure to other specified factors, initial encounter: Secondary | ICD-10-CM | POA: Diagnosis not present

## 2015-12-06 DIAGNOSIS — G35 Multiple sclerosis: Secondary | ICD-10-CM | POA: Diagnosis not present

## 2015-12-06 DIAGNOSIS — E1140 Type 2 diabetes mellitus with diabetic neuropathy, unspecified: Secondary | ICD-10-CM | POA: Insufficient documentation

## 2015-12-06 DIAGNOSIS — I739 Peripheral vascular disease, unspecified: Secondary | ICD-10-CM

## 2015-12-06 DIAGNOSIS — M47817 Spondylosis without myelopathy or radiculopathy, lumbosacral region: Secondary | ICD-10-CM | POA: Diagnosis not present

## 2015-12-06 DIAGNOSIS — L97509 Non-pressure chronic ulcer of other part of unspecified foot with unspecified severity: Secondary | ICD-10-CM

## 2015-12-06 DIAGNOSIS — L97519 Non-pressure chronic ulcer of other part of right foot with unspecified severity: Secondary | ICD-10-CM

## 2015-12-06 DIAGNOSIS — M19019 Primary osteoarthritis, unspecified shoulder: Secondary | ICD-10-CM | POA: Diagnosis not present

## 2015-12-06 DIAGNOSIS — M5137 Other intervertebral disc degeneration, lumbosacral region: Secondary | ICD-10-CM

## 2015-12-06 DIAGNOSIS — M5416 Radiculopathy, lumbar region: Secondary | ICD-10-CM | POA: Diagnosis not present

## 2015-12-06 DIAGNOSIS — S7292XS Unspecified fracture of left femur, sequela: Secondary | ICD-10-CM | POA: Diagnosis not present

## 2015-12-06 DIAGNOSIS — M47816 Spondylosis without myelopathy or radiculopathy, lumbar region: Secondary | ICD-10-CM

## 2015-12-06 DIAGNOSIS — J449 Chronic obstructive pulmonary disease, unspecified: Secondary | ICD-10-CM | POA: Diagnosis not present

## 2015-12-06 DIAGNOSIS — E1340 Other specified diabetes mellitus with diabetic neuropathy, unspecified: Secondary | ICD-10-CM

## 2015-12-06 DIAGNOSIS — E669 Obesity, unspecified: Secondary | ICD-10-CM | POA: Insufficient documentation

## 2015-12-06 DIAGNOSIS — M5030 Other cervical disc degeneration, unspecified cervical region: Secondary | ICD-10-CM

## 2015-12-06 DIAGNOSIS — M25561 Pain in right knee: Secondary | ICD-10-CM

## 2015-12-06 DIAGNOSIS — E1121 Type 2 diabetes mellitus with diabetic nephropathy: Secondary | ICD-10-CM | POA: Diagnosis not present

## 2015-12-06 DIAGNOSIS — S7292XA Unspecified fracture of left femur, initial encounter for closed fracture: Secondary | ICD-10-CM | POA: Diagnosis not present

## 2015-12-06 DIAGNOSIS — R51 Headache: Secondary | ICD-10-CM | POA: Diagnosis present

## 2015-12-06 DIAGNOSIS — S72332D Displaced oblique fracture of shaft of left femur, subsequent encounter for closed fracture with routine healing: Secondary | ICD-10-CM | POA: Diagnosis not present

## 2015-12-06 DIAGNOSIS — M542 Cervicalgia: Secondary | ICD-10-CM | POA: Diagnosis present

## 2015-12-06 DIAGNOSIS — M791 Myalgia: Secondary | ICD-10-CM | POA: Diagnosis not present

## 2015-12-06 DIAGNOSIS — E11621 Type 2 diabetes mellitus with foot ulcer: Secondary | ICD-10-CM

## 2015-12-06 DIAGNOSIS — M461 Sacroiliitis, not elsewhere classified: Secondary | ICD-10-CM | POA: Diagnosis not present

## 2015-12-06 DIAGNOSIS — M109 Gout, unspecified: Secondary | ICD-10-CM | POA: Diagnosis not present

## 2015-12-06 MED ORDER — GABAPENTIN 300 MG PO CAPS: ORAL_CAPSULE | ORAL | Status: DC

## 2015-12-06 MED ORDER — OXYCODONE HCL 5 MG PO TABS: ORAL_TABLET | ORAL | Status: DC

## 2015-12-06 NOTE — Progress Notes (Signed)
Subjective:    Patient ID: Madeline [name], female    DOB: [birth year], 65 y.o.   MRN: [id number]  HPI The patient is a 65 year old female who returns to pain management for further evaluation and treatment of pain involving the neck headaches entire back upper extremity region shoulders and especially the left lower extremity region. The patient is status post fall and bathroom sustaining fractured to the left lower extremity. The patient has undergone open reduction internal fixation of the fracture per Dr. Dorothyann Peng of [facility]. At the present time patient is engaged and physical therapy and appears to be tolerating physical therapy fairly well. We discussed patient's condition and will avoid interventional treatment and we'll continue medications consisting of Neurontin and oxycodone. Patient denies any trauma change in events of daily living because change in symptomatology. The patient states that the pain is fairly well-controlled present treatment regimen. The patient states that she appears to be tolerating physical therapy well. We remain available to consider modifications of treatment pending follow-up evaluation all agreed with suggested treatment plan   Review of Systems     Objective:   Physical Exam  There was tends to palpation of paraspinal must reason the cervical region cervical facet region palpation which be produced pain of moderate degree with moderate tenderness of the splenius capitis and occipitalis musculature region. There was tenderness of the acromial clavicular and glenohumeral joint region a moderate to moderately severe degree with limited range of motion of the shoulders. Tinel and Phalen's maneuver were without increased pain of significant degree and patient appeared to be with slightly decreased grip strength. Palpation over the cervical facet cervical paraspinal musculatures and thoracic facet thoracic paraspinal musculature region reproduced moderate  discomfort. No crepitus of the thoracic region was noted. Palpation over the lumbar paraspinal must reason lumbar facet region was attends to palpation of moderate degree with moderate tenderness over the PSIS and PII S region and moderate tenderness of the greater trochanteric region and iliotibial band region. There were well-healed scars of the left lower extremity without increased warmth and erythema in the region of the lower extremity. EHL strength appeared to be decreased there was increased pain involving the knees with range of motion maneuvers of the knees with crepitus noted in the region of the knees. No definite sensory deficit or dermatomal distribution detected. There was negative Homans. Straight leg raise was tolerates approximately 20. Abdomen was without tenderness to palpation and no costovertebral tenderness was noted    Assessment & Plan:  Degenerative disc disease lumbar spine Degenerative changes lumbar spine with multilevel involvement with L4-L5 level most significantly involved Lumbar facet syndrome  Fracture of left lower extremity(recent) Comminuted, oblique intra-articular fracture involving the mid and distal left femur Patient is status post surgical intervention for fracture performed at [facility] Department of orthopedic surgery  Diabetic neuropathy  Obesity  Degenerative joint disease of shoulder      PLAN    Continue present medication Neurontin oxycodone and baclofen  F/U PCP Dr. Silvio Pate  for evaliation of  BP diabetes mellitus fracture of lower extremity and general medical  condition  F/U surgical evaluation. . Patient will undergo follow-up surgical evaluation with Dr. Dorothyann Peng as discussed . Limited activity as per Dr. Dorothyann Peng   F/U neurological evaluation. May consider pending follow-up evaluations  Continue physical therapy as presently doing and avoid aggravation of symptoms  F/U wound care clinic evaluation  May  consider radiofrequency rhizolysis or intraspinal procedures pending response  to present treatment and F/U evaluation   Patient to call Pain Management Center should patient have concerns prior to scheduled return appointment.

## 2015-12-06 NOTE — Progress Notes (Signed)
Safety precautions to be maintained throughout the outpatient stay will include: orient to surroundings, keep bed in low position, maintain call bell within reach at all times, provide assistance with transfer out of bed and ambulation. s

## 2015-12-06 NOTE — Patient Instructions (Signed)
PLAN    Continue present medication Neurontin oxycodone and baclofen  F/U PCP Dr. Silvio Pate  for evaliation of  BP diabetes mellitus fracture of lower extremity and general medical  condition  F/U surgical evaluation. . Patient will undergo follow-up surgical evaluation with Dr. Dorothyann Peng as discussed . Limited activity as per Dr. Dorothyann Peng   F/U neurological evaluation. May consider pending follow-up evaluations  Continue physical therapy as presently doing and avoid aggravation of symptoms  F/U wound care clinic evaluation  May consider radiofrequency rhizolysis or intraspinal procedures pending response to present treatment and F/U evaluation   Patient to call [facility] Management Center should patient have concerns prior to scheduled return appointment.

## 2015-12-07 DIAGNOSIS — S72332D Displaced oblique fracture of shaft of left femur, subsequent encounter for closed fracture with routine healing: Secondary | ICD-10-CM | POA: Diagnosis not present

## 2015-12-07 DIAGNOSIS — J449 Chronic obstructive pulmonary disease, unspecified: Secondary | ICD-10-CM | POA: Diagnosis not present

## 2015-12-07 DIAGNOSIS — S7292XS Unspecified fracture of left femur, sequela: Secondary | ICD-10-CM | POA: Diagnosis not present

## 2015-12-07 DIAGNOSIS — G35 Multiple sclerosis: Secondary | ICD-10-CM | POA: Diagnosis not present

## 2015-12-07 DIAGNOSIS — M109 Gout, unspecified: Secondary | ICD-10-CM | POA: Diagnosis not present

## 2015-12-07 DIAGNOSIS — E1121 Type 2 diabetes mellitus with diabetic nephropathy: Secondary | ICD-10-CM | POA: Diagnosis not present

## 2015-12-08 DIAGNOSIS — J449 Chronic obstructive pulmonary disease, unspecified: Secondary | ICD-10-CM | POA: Diagnosis not present

## 2015-12-10 ENCOUNTER — Other Ambulatory Visit: Payer: Self-pay | Admitting: Internal Medicine

## 2015-12-10 ENCOUNTER — Ambulatory Visit: Payer: Medicare Other

## 2015-12-10 DIAGNOSIS — M109 Gout, unspecified: Secondary | ICD-10-CM | POA: Diagnosis not present

## 2015-12-10 DIAGNOSIS — J449 Chronic obstructive pulmonary disease, unspecified: Secondary | ICD-10-CM | POA: Diagnosis not present

## 2015-12-10 DIAGNOSIS — G35 Multiple sclerosis: Secondary | ICD-10-CM | POA: Diagnosis not present

## 2015-12-10 DIAGNOSIS — E1121 Type 2 diabetes mellitus with diabetic nephropathy: Secondary | ICD-10-CM | POA: Diagnosis not present

## 2015-12-10 DIAGNOSIS — S7292XS Unspecified fracture of left femur, sequela: Secondary | ICD-10-CM | POA: Diagnosis not present

## 2015-12-10 DIAGNOSIS — S72332D Displaced oblique fracture of shaft of left femur, subsequent encounter for closed fracture with routine healing: Secondary | ICD-10-CM | POA: Diagnosis not present

## 2015-12-11 ENCOUNTER — Other Ambulatory Visit: Payer: Self-pay | Admitting: Internal Medicine

## 2015-12-11 NOTE — Telephone Encounter (Signed)
rx called into pharmacy

## 2015-12-11 NOTE — Telephone Encounter (Signed)
Approved: #[phone] x 0 Please find out how she is doing--hopefully able to bear weight on that leg now

## 2015-12-11 NOTE — Telephone Encounter (Signed)
08/07/2015 

## 2015-12-12 DIAGNOSIS — S72332D Displaced oblique fracture of shaft of left femur, subsequent encounter for closed fracture with routine healing: Secondary | ICD-10-CM | POA: Diagnosis not present

## 2015-12-12 DIAGNOSIS — J449 Chronic obstructive pulmonary disease, unspecified: Secondary | ICD-10-CM | POA: Diagnosis not present

## 2015-12-12 DIAGNOSIS — S7292XS Unspecified fracture of left femur, sequela: Secondary | ICD-10-CM | POA: Diagnosis not present

## 2015-12-12 DIAGNOSIS — G35 Multiple sclerosis: Secondary | ICD-10-CM | POA: Diagnosis not present

## 2015-12-12 DIAGNOSIS — M109 Gout, unspecified: Secondary | ICD-10-CM | POA: Diagnosis not present

## 2015-12-12 DIAGNOSIS — E1121 Type 2 diabetes mellitus with diabetic nephropathy: Secondary | ICD-10-CM | POA: Diagnosis not present

## 2015-12-13 DIAGNOSIS — J449 Chronic obstructive pulmonary disease, unspecified: Secondary | ICD-10-CM | POA: Diagnosis not present

## 2015-12-13 DIAGNOSIS — M109 Gout, unspecified: Secondary | ICD-10-CM | POA: Diagnosis not present

## 2015-12-13 DIAGNOSIS — G35 Multiple sclerosis: Secondary | ICD-10-CM | POA: Diagnosis not present

## 2015-12-13 DIAGNOSIS — S7292XS Unspecified fracture of left femur, sequela: Secondary | ICD-10-CM | POA: Diagnosis not present

## 2015-12-13 DIAGNOSIS — S72332D Displaced oblique fracture of shaft of left femur, subsequent encounter for closed fracture with routine healing: Secondary | ICD-10-CM | POA: Diagnosis not present

## 2015-12-13 DIAGNOSIS — E1121 Type 2 diabetes mellitus with diabetic nephropathy: Secondary | ICD-10-CM | POA: Diagnosis not present

## 2015-12-17 DIAGNOSIS — S72332D Displaced oblique fracture of shaft of left femur, subsequent encounter for closed fracture with routine healing: Secondary | ICD-10-CM | POA: Diagnosis not present

## 2015-12-17 DIAGNOSIS — E1121 Type 2 diabetes mellitus with diabetic nephropathy: Secondary | ICD-10-CM | POA: Diagnosis not present

## 2015-12-17 DIAGNOSIS — G35 Multiple sclerosis: Secondary | ICD-10-CM | POA: Diagnosis not present

## 2015-12-17 DIAGNOSIS — J449 Chronic obstructive pulmonary disease, unspecified: Secondary | ICD-10-CM | POA: Diagnosis not present

## 2015-12-17 DIAGNOSIS — S7292XS Unspecified fracture of left femur, sequela: Secondary | ICD-10-CM | POA: Diagnosis not present

## 2015-12-17 DIAGNOSIS — M109 Gout, unspecified: Secondary | ICD-10-CM | POA: Diagnosis not present

## 2015-12-19 DIAGNOSIS — G35 Multiple sclerosis: Secondary | ICD-10-CM | POA: Diagnosis not present

## 2015-12-19 DIAGNOSIS — S7292XS Unspecified fracture of left femur, sequela: Secondary | ICD-10-CM | POA: Diagnosis not present

## 2015-12-19 DIAGNOSIS — S72332D Displaced oblique fracture of shaft of left femur, subsequent encounter for closed fracture with routine healing: Secondary | ICD-10-CM | POA: Diagnosis not present

## 2015-12-19 DIAGNOSIS — M109 Gout, unspecified: Secondary | ICD-10-CM | POA: Diagnosis not present

## 2015-12-19 DIAGNOSIS — E1121 Type 2 diabetes mellitus with diabetic nephropathy: Secondary | ICD-10-CM | POA: Diagnosis not present

## 2015-12-19 DIAGNOSIS — J449 Chronic obstructive pulmonary disease, unspecified: Secondary | ICD-10-CM | POA: Diagnosis not present

## 2015-12-20 ENCOUNTER — Emergency Department: Payer: Medicare Other

## 2015-12-20 ENCOUNTER — Inpatient Hospital Stay: Payer: Medicare Other

## 2015-12-20 ENCOUNTER — Encounter: Payer: Self-pay | Admitting: Emergency Medicine

## 2015-12-20 ENCOUNTER — Inpatient Hospital Stay
Admission: EM | Admit: 2015-12-20 | Discharge: 2015-12-27 | DRG: 478 | Disposition: A | Discharge status: Home Health | Payer: Medicare Other | Attending: Internal Medicine | Admitting: Internal Medicine

## 2015-12-20 DIAGNOSIS — M109 Gout, unspecified: Secondary | ICD-10-CM | POA: Diagnosis present

## 2015-12-20 DIAGNOSIS — M549 Dorsalgia, unspecified: Secondary | ICD-10-CM | POA: Diagnosis not present

## 2015-12-20 DIAGNOSIS — E1142 Type 2 diabetes mellitus with diabetic polyneuropathy: Secondary | ICD-10-CM | POA: Diagnosis present

## 2015-12-20 DIAGNOSIS — K5909 Other constipation: Secondary | ICD-10-CM | POA: Diagnosis not present

## 2015-12-20 DIAGNOSIS — M546 Pain in thoracic spine: Secondary | ICD-10-CM | POA: Diagnosis not present

## 2015-12-20 DIAGNOSIS — C50919 Malignant neoplasm of unspecified site of unspecified female breast: Secondary | ICD-10-CM | POA: Diagnosis not present

## 2015-12-20 DIAGNOSIS — E039 Hypothyroidism, unspecified: Secondary | ICD-10-CM | POA: Diagnosis present

## 2015-12-20 DIAGNOSIS — S22070A Wedge compression fracture of T9-T10 vertebra, initial encounter for closed fracture: Secondary | ICD-10-CM | POA: Diagnosis not present

## 2015-12-20 DIAGNOSIS — M8458XA Pathological fracture in neoplastic disease, other specified site, initial encounter for fracture: Secondary | ICD-10-CM | POA: Diagnosis not present

## 2015-12-20 DIAGNOSIS — M8448XA Pathological fracture, other site, initial encounter for fracture: Secondary | ICD-10-CM | POA: Diagnosis not present

## 2015-12-20 DIAGNOSIS — Z8249 Family history of ischemic heart disease and other diseases of the circulatory system: Secondary | ICD-10-CM

## 2015-12-20 DIAGNOSIS — J449 Chronic obstructive pulmonary disease, unspecified: Secondary | ICD-10-CM | POA: Diagnosis present

## 2015-12-20 DIAGNOSIS — E785 Hyperlipidemia, unspecified: Secondary | ICD-10-CM | POA: Diagnosis present

## 2015-12-20 DIAGNOSIS — C7951 Secondary malignant neoplasm of bone: Secondary | ICD-10-CM | POA: Diagnosis not present

## 2015-12-20 DIAGNOSIS — K219 Gastro-esophageal reflux disease without esophagitis: Secondary | ICD-10-CM | POA: Diagnosis present

## 2015-12-20 DIAGNOSIS — S229XXA Fracture of bony thorax, part unspecified, initial encounter for closed fracture: Secondary | ICD-10-CM | POA: Diagnosis not present

## 2015-12-20 DIAGNOSIS — Z880 Allergy status to penicillin: Secondary | ICD-10-CM

## 2015-12-20 DIAGNOSIS — Z7982 Long term (current) use of aspirin: Secondary | ICD-10-CM | POA: Diagnosis not present

## 2015-12-20 DIAGNOSIS — I10 Essential (primary) hypertension: Secondary | ICD-10-CM | POA: Diagnosis present

## 2015-12-20 DIAGNOSIS — Z853 Personal history of malignant neoplasm of breast: Secondary | ICD-10-CM

## 2015-12-20 DIAGNOSIS — R0600 Dyspnea, unspecified: Secondary | ICD-10-CM

## 2015-12-20 DIAGNOSIS — Z9221 Personal history of antineoplastic chemotherapy: Secondary | ICD-10-CM

## 2015-12-20 DIAGNOSIS — Z86718 Personal history of other venous thrombosis and embolism: Secondary | ICD-10-CM

## 2015-12-20 DIAGNOSIS — Z9012 Acquired absence of left breast and nipple: Secondary | ICD-10-CM

## 2015-12-20 DIAGNOSIS — C799 Secondary malignant neoplasm of unspecified site: Secondary | ICD-10-CM

## 2015-12-20 DIAGNOSIS — G8929 Other chronic pain: Secondary | ICD-10-CM | POA: Diagnosis present

## 2015-12-20 DIAGNOSIS — Z87440 Personal history of urinary (tract) infections: Secondary | ICD-10-CM

## 2015-12-20 DIAGNOSIS — G35 Multiple sclerosis: Secondary | ICD-10-CM | POA: Diagnosis not present

## 2015-12-20 DIAGNOSIS — M4800 Spinal stenosis, site unspecified: Secondary | ICD-10-CM | POA: Diagnosis present

## 2015-12-20 DIAGNOSIS — F17200 Nicotine dependence, unspecified, uncomplicated: Secondary | ICD-10-CM | POA: Diagnosis not present

## 2015-12-20 DIAGNOSIS — Z923 Personal history of irradiation: Secondary | ICD-10-CM | POA: Diagnosis not present

## 2015-12-20 DIAGNOSIS — Z170 Estrogen receptor positive status [ER+]: Secondary | ICD-10-CM | POA: Diagnosis not present

## 2015-12-20 DIAGNOSIS — Z6841 Body Mass Index (BMI) 40.0 and over, adult: Secondary | ICD-10-CM | POA: Diagnosis not present

## 2015-12-20 DIAGNOSIS — M899 Disorder of bone, unspecified: Secondary | ICD-10-CM | POA: Diagnosis not present

## 2015-12-20 DIAGNOSIS — K8020 Calculus of gallbladder without cholecystitis without obstruction: Secondary | ICD-10-CM | POA: Diagnosis not present

## 2015-12-20 DIAGNOSIS — R609 Edema, unspecified: Secondary | ICD-10-CM

## 2015-12-20 DIAGNOSIS — Z720 Tobacco use: Secondary | ICD-10-CM | POA: Diagnosis not present

## 2015-12-20 DIAGNOSIS — M4854XA Collapsed vertebra, not elsewhere classified, thoracic region, initial encounter for fracture: Secondary | ICD-10-CM | POA: Diagnosis not present

## 2015-12-20 DIAGNOSIS — Z79891 Long term (current) use of opiate analgesic: Secondary | ICD-10-CM | POA: Diagnosis not present

## 2015-12-20 DIAGNOSIS — Z794 Long term (current) use of insulin: Secondary | ICD-10-CM | POA: Diagnosis not present

## 2015-12-20 DIAGNOSIS — Z7951 Long term (current) use of inhaled steroids: Secondary | ICD-10-CM | POA: Diagnosis not present

## 2015-12-20 DIAGNOSIS — F17210 Nicotine dependence, cigarettes, uncomplicated: Secondary | ICD-10-CM | POA: Diagnosis present

## 2015-12-20 DIAGNOSIS — M4850XA Collapsed vertebra, not elsewhere classified, site unspecified, initial encounter for fracture: Secondary | ICD-10-CM | POA: Diagnosis not present

## 2015-12-20 DIAGNOSIS — Z882 Allergy status to sulfonamides status: Secondary | ICD-10-CM | POA: Diagnosis not present

## 2015-12-20 DIAGNOSIS — D649 Anemia, unspecified: Secondary | ICD-10-CM | POA: Diagnosis not present

## 2015-12-20 DIAGNOSIS — Z791 Long term (current) use of non-steroidal anti-inflammatories (NSAID): Secondary | ICD-10-CM | POA: Diagnosis not present

## 2015-12-20 DIAGNOSIS — M545 Low back pain: Secondary | ICD-10-CM | POA: Diagnosis not present

## 2015-12-20 DIAGNOSIS — Z419 Encounter for procedure for purposes other than remedying health state, unspecified: Secondary | ICD-10-CM

## 2015-12-20 DIAGNOSIS — D638 Anemia in other chronic diseases classified elsewhere: Secondary | ICD-10-CM | POA: Diagnosis present

## 2015-12-20 DIAGNOSIS — M4856XA Collapsed vertebra, not elsewhere classified, lumbar region, initial encounter for fracture: Secondary | ICD-10-CM | POA: Diagnosis not present

## 2015-12-20 DIAGNOSIS — E669 Obesity, unspecified: Secondary | ICD-10-CM | POA: Diagnosis present

## 2015-12-20 DIAGNOSIS — IMO0002 Reserved for concepts with insufficient information to code with codable children: Secondary | ICD-10-CM

## 2015-12-20 HISTORY — DX: Unspecified fracture of unspecified femur, initial encounter for closed fracture: S72.90XA

## 2015-12-20 HISTORY — DX: Urinary tract infection, site not specified: N39.0

## 2015-12-20 LAB — COMPREHENSIVE METABOLIC PANEL
ALBUMIN: 3.8 g/dL (ref 3.5–5.0)
ALT: 24 U/L (ref 14–54)
AST: 29 U/L (ref 15–41)
Alkaline Phosphatase: 145 U/L — ABNORMAL HIGH (ref 38–126)
Anion gap: 9 (ref 5–15)
BUN: 17 mg/dL (ref 6–20)
CHLORIDE: 101 mmol/L (ref 101–111)
CO2: 34 mmol/L — AB (ref 22–32)
CREATININE: 0.8 mg/dL (ref 0.44–1.00)
Calcium: 9.6 mg/dL (ref 8.9–10.3)
GFR calc non Af Amer: >60 mL/min (ref >60)
GLUCOSE: 74 mg/dL (ref 65–99)
Potassium: 4 mmol/L (ref 3.5–5.1)
SODIUM: 144 mmol/L (ref 135–145)
Total Bilirubin: 0.8 mg/dL (ref 0.3–1.2)
Total Protein: 7.2 g/dL (ref 6.5–8.1)

## 2015-12-20 LAB — CBC WITH DIFFERENTIAL/PLATELET
BASOS ABS: 0.1 10*3/uL (ref 0–0.1)
BASOS PCT: 1 %
EOS ABS: 0.5 10*3/uL (ref 0–0.7)
EOS PCT: 6 %
HCT: 37.2 % (ref 35.0–47.0)
Hemoglobin: 11.8 g/dL — ABNORMAL LOW (ref 12.0–16.0)
Lymphocytes Relative: 25 %
Lymphs Abs: 1.9 10*3/uL (ref 1.0–3.6)
MCH: 26.2 pg (ref 26.0–34.0)
MCHC: 31.6 g/dL — ABNORMAL LOW (ref 32.0–36.0)
MCV: 82.8 fL (ref 80.0–100.0)
Monocytes Absolute: 1 10*3/uL — ABNORMAL HIGH (ref 0.2–0.9)
Monocytes Relative: 14 %
NEUTROS PCT: 54 %
Neutro Abs: 4.2 10*3/uL (ref 1.4–6.5)
PLATELETS: 213 10*3/uL (ref 150–440)
RBC: 4.5 MIL/uL (ref 3.80–5.20)
RDW: 16.4 % — ABNORMAL HIGH (ref 11.5–14.5)
WBC: 7.7 10*3/uL (ref 3.6–11.0)

## 2015-12-20 LAB — URINALYSIS COMPLETE WITH MICROSCOPIC (ARMC ONLY)
BACTERIA UA: NONE SEEN
Bilirubin Urine: NEGATIVE
Glucose, UA: NEGATIVE mg/dL
HGB URINE DIPSTICK: NEGATIVE
Ketones, ur: NEGATIVE mg/dL
LEUKOCYTES UA: NEGATIVE
Nitrite: NEGATIVE
PROTEIN: NEGATIVE mg/dL
RBC / HPF: NONE SEEN RBC/hpf (ref 0–5)
SPECIFIC GRAVITY, URINE: 1.005 (ref 1.005–1.030)
WBC, UA: NONE SEEN WBC/hpf (ref 0–5)
pH: 5 (ref 5.0–8.0)

## 2015-12-20 LAB — GLUCOSE, CAPILLARY: Glucose-Capillary: 169 mg/dL — ABNORMAL HIGH (ref 65–99)

## 2015-12-20 LAB — FIBRIN DERIVATIVES D-DIMER (ARMC ONLY): Fibrin derivatives D-dimer (ARMC): 1803 — ABNORMAL HIGH (ref 0–499)

## 2015-12-20 LAB — LIPASE, BLOOD: Lipase: 25 U/L (ref 11–51)

## 2015-12-20 MED ORDER — ALBUTEROL SULFATE (2.5 MG/3ML) 0.083% IN NEBU: 2.5000 mg | INHALATION_SOLUTION | Freq: Four times a day (QID) | RESPIRATORY_TRACT | Status: DC | PRN

## 2015-12-20 MED ORDER — ONDANSETRON HCL 4 MG/2ML IJ SOLN: 4.0000 mg | Freq: Four times a day (QID) | INTRAMUSCULAR | Status: DC | PRN

## 2015-12-20 MED ORDER — DOCUSATE SODIUM 100 MG PO CAPS
100.0000 mg | ORAL_CAPSULE | Freq: Two times a day (BID) | ORAL | Status: DC
  Administered 2015-12-20 – 2015-12-27 (×14): 100 mg via ORAL
  Filled 2015-12-20 (×14): qty 1

## 2015-12-20 MED ORDER — FLUOXETINE HCL 20 MG PO CAPS
20.0000 mg | ORAL_CAPSULE | Freq: Every day | ORAL | Status: DC
  Administered 2015-12-21 – 2015-12-27 (×7): 20 mg via ORAL
  Filled 2015-12-20 (×7): qty 1

## 2015-12-20 MED ORDER — INSULIN ASPART 100 UNIT/ML ~~LOC~~ SOLN
0.0000 [IU] | Freq: Three times a day (TID) | SUBCUTANEOUS | Status: DC
  Administered 2015-12-21 – 2015-12-25 (×3): 4 [IU] via SUBCUTANEOUS
  Administered 2015-12-26: 08:00:00 2 [IU] via SUBCUTANEOUS
  Administered 2015-12-26: 4 [IU] via SUBCUTANEOUS
  Filled 2015-12-20: qty 4
  Filled 2015-12-20: qty 2
  Filled 2015-12-20: qty 4
  Filled 2015-12-20: qty 3
  Filled 2015-12-20: qty 7
  Filled 2015-12-20 (×2): qty 4

## 2015-12-20 MED ORDER — INSULIN GLARGINE 100 UNIT/ML SOLOSTAR PEN: 47.0000 [IU] | PEN_INJECTOR | Freq: Two times a day (BID) | SUBCUTANEOUS | Status: DC

## 2015-12-20 MED ORDER — TRAZODONE HCL 100 MG PO TABS
200.0000 mg | ORAL_TABLET | Freq: Every day | ORAL | Status: DC
  Administered 2015-12-20 – 2015-12-26 (×7): 200 mg via ORAL
  Filled 2015-12-20 (×7): qty 2

## 2015-12-20 MED ORDER — HYDROMORPHONE HCL 1 MG/ML IJ SOLN
1.0000 mg | INTRAMUSCULAR | Status: DC | PRN
  Administered 2015-12-21 – 2015-12-26 (×20): 1 mg via INTRAVENOUS
  Filled 2015-12-20 (×21): qty 1

## 2015-12-20 MED ORDER — PANTOPRAZOLE SODIUM 40 MG PO TBEC
40.0000 mg | DELAYED_RELEASE_TABLET | Freq: Every day | ORAL | Status: DC
  Administered 2015-12-20 – 2015-12-27 (×8): 40 mg via ORAL
  Filled 2015-12-20 (×8): qty 1

## 2015-12-20 MED ORDER — FUROSEMIDE 40 MG PO TABS
80.0000 mg | ORAL_TABLET | Freq: Every day | ORAL | Status: DC
  Administered 2015-12-21 – 2015-12-27 (×7): 80 mg via ORAL
  Filled 2015-12-20 (×7): qty 2

## 2015-12-20 MED ORDER — FENTANYL CITRATE (PF) 100 MCG/2ML IJ SOLN
50.0000 ug | Freq: Once | INTRAMUSCULAR | Status: AC
  Administered 2015-12-20: 50 ug via INTRAVENOUS
  Filled 2015-12-20: qty 2

## 2015-12-20 MED ORDER — CARVEDILOL 25 MG PO TABS
25.0000 mg | ORAL_TABLET | Freq: Two times a day (BID) | ORAL | Status: DC
  Administered 2015-12-21 – 2015-12-27 (×11): 25 mg via ORAL
  Filled 2015-12-20 (×2): qty 1
  Filled 2015-12-20: qty 4
  Filled 2015-12-20 (×7): qty 1
  Filled 2015-12-20: qty 4
  Filled 2015-12-20: qty 1
  Filled 2015-12-20 (×2): qty 4
  Filled 2015-12-20: qty 1

## 2015-12-20 MED ORDER — GABAPENTIN 300 MG PO CAPS
300.0000 mg | ORAL_CAPSULE | Freq: Three times a day (TID) | ORAL | Status: DC
  Administered 2015-12-20 – 2015-12-21 (×3): 300 mg via ORAL
  Filled 2015-12-20 (×3): qty 1

## 2015-12-20 MED ORDER — METFORMIN HCL 500 MG PO TABS
1000.0000 mg | ORAL_TABLET | Freq: Every day | ORAL | Status: DC
  Administered 2015-12-21 – 2015-12-22 (×2): 1000 mg via ORAL
  Filled 2015-12-20 (×2): qty 2

## 2015-12-20 MED ORDER — IOHEXOL 350 MG/ML SOLN
100.0000 mL | Freq: Once | INTRAVENOUS | Status: AC | PRN
  Administered 2015-12-20: 100 mL via INTRAVENOUS

## 2015-12-20 MED ORDER — ALLOPURINOL 300 MG PO TABS
300.0000 mg | ORAL_TABLET | Freq: Every day | ORAL | Status: DC
  Administered 2015-12-21 – 2015-12-27 (×7): 300 mg via ORAL
  Filled 2015-12-20 (×8): qty 1

## 2015-12-20 MED ORDER — CALCIUM CARBONATE-VITAMIN D 500-200 MG-UNIT PO TABS
1.0000 | ORAL_TABLET | Freq: Two times a day (BID) | ORAL | Status: DC
  Administered 2015-12-21 – 2015-12-23 (×5): 1 via ORAL
  Filled 2015-12-20 (×6): qty 1

## 2015-12-20 MED ORDER — TIOTROPIUM BROMIDE MONOHYDRATE 18 MCG IN CAPS
18.0000 ug | ORAL_CAPSULE | Freq: Every day | RESPIRATORY_TRACT | Status: DC
  Administered 2015-12-21 – 2015-12-27 (×7): 18 ug via RESPIRATORY_TRACT
  Filled 2015-12-20 (×2): qty 5

## 2015-12-20 MED ORDER — OXYCODONE HCL 5 MG PO TABS
10.0000 mg | ORAL_TABLET | ORAL | Status: DC | PRN
  Administered 2015-12-20 – 2015-12-27 (×14): 10 mg via ORAL
  Filled 2015-12-20 (×14): qty 2

## 2015-12-20 MED ORDER — BUDESONIDE-FORMOTEROL FUMARATE 160-4.5 MCG/ACT IN AERO
1.0000 | INHALATION_SPRAY | Freq: Two times a day (BID) | RESPIRATORY_TRACT | Status: DC
  Administered 2015-12-20 – 2015-12-27 (×14): 1 via RESPIRATORY_TRACT
  Filled 2015-12-20 (×2): qty 6

## 2015-12-20 MED ORDER — INSULIN GLARGINE 100 UNIT/ML ~~LOC~~ SOLN
47.0000 [IU] | Freq: Two times a day (BID) | SUBCUTANEOUS | Status: DC
  Administered 2015-12-20 – 2015-12-27 (×13): 47 [IU] via SUBCUTANEOUS
  Filled 2015-12-20 (×18): qty 0.47

## 2015-12-20 MED ORDER — INSULIN ASPART 100 UNIT/ML ~~LOC~~ SOLN
0.0000 [IU] | Freq: Every day | SUBCUTANEOUS | Status: DC
  Administered 2015-12-24: 3 [IU] via SUBCUTANEOUS
  Filled 2015-12-20: qty 3

## 2015-12-20 MED ORDER — BISACODYL 5 MG PO TBEC
5.0000 mg | DELAYED_RELEASE_TABLET | Freq: Every day | ORAL | Status: DC | PRN
  Administered 2015-12-26: 20:00:00 5 mg via ORAL
  Filled 2015-12-20: qty 1

## 2015-12-20 MED ORDER — INSULIN ASPART 100 UNIT/ML ~~LOC~~ SOLN
4.0000 [IU] | Freq: Three times a day (TID) | SUBCUTANEOUS | Status: DC
  Administered 2015-12-21: 09:00:00 4 [IU] via SUBCUTANEOUS
  Filled 2015-12-20: qty 4

## 2015-12-20 MED ORDER — SODIUM CHLORIDE 0.9 % IV SOLN
INTRAVENOUS | Status: DC
  Administered 2015-12-20 – 2015-12-23 (×4): via INTRAVENOUS

## 2015-12-20 MED ORDER — ONDANSETRON HCL 4 MG PO TABS: 4.0000 mg | ORAL_TABLET | Freq: Four times a day (QID) | ORAL | Status: DC | PRN

## 2015-12-20 MED ORDER — ASPIRIN 325 MG PO TABS
325.0000 mg | ORAL_TABLET | Freq: Every day | ORAL | Status: DC
  Administered 2015-12-21 – 2015-12-27 (×6): 325 mg via ORAL
  Filled 2015-12-20 (×6): qty 1

## 2015-12-20 MED ORDER — ENOXAPARIN SODIUM 40 MG/0.4ML ~~LOC~~ SOLN
40.0000 mg | SUBCUTANEOUS | Status: DC
  Administered 2015-12-20: 22:00:00 40 mg via SUBCUTANEOUS
  Filled 2015-12-20 (×2): qty 0.4

## 2015-12-20 MED ORDER — OMEGA-3-ACID ETHYL ESTERS 1 G PO CAPS
2.0000 g | ORAL_CAPSULE | Freq: Every day | ORAL | Status: DC
  Administered 2015-12-21 – 2015-12-27 (×7): 2 g via ORAL
  Filled 2015-12-20 (×7): qty 2

## 2015-12-20 MED ORDER — VITAMIN D 1000 UNITS PO TABS
4000.0000 [IU] | ORAL_TABLET | Freq: Every morning | ORAL | Status: DC
  Administered 2015-12-21 – 2015-12-27 (×7): 4000 [IU] via ORAL
  Filled 2015-12-20 (×7): qty 4

## 2015-12-20 MED ORDER — SENNA 8.6 MG PO TABS
1.0000 | ORAL_TABLET | Freq: Two times a day (BID) | ORAL | Status: DC
  Administered 2015-12-21 – 2015-12-27 (×12): 8.6 mg via ORAL
  Filled 2015-12-20 (×14): qty 1

## 2015-12-20 MED ORDER — LEVOTHYROXINE SODIUM 50 MCG PO TABS
175.0000 ug | ORAL_TABLET | Freq: Every day | ORAL | Status: DC
  Administered 2015-12-21 – 2015-12-27 (×7): 175 ug via ORAL
  Filled 2015-12-20 (×7): qty 1

## 2015-12-20 NOTE — ED Notes (Signed)
Pt presents via [facility] with c/o worsening back pain x 3 days. Per EMS pt denies falls or injury at this time. EMS states that patient takes 5mg  of Oxycodone as prescribed Q4 hrs. EMS states that patient has been seeing PT since January for a previous femur fracture last year, per patient she last saw PT yesterday. EMS states pt has a L side restriction due to a mastectomy for breast cancer. VSS on arrival.

## 2015-12-20 NOTE — ED Notes (Signed)
Pt getting CTA at this time.  [place] Miners, RN notified.

## 2015-12-20 NOTE — H&P (Signed)
Madeline [name] at [facility] NAME: Madeline [name]    MR#:  [id number]  DATE OF BIRTH:  [birth year]  DATE OF ADMISSION:  12/20/2015  PRIMARY CARE PHYSICIAN: Viviana Simpler, MD   REQUESTING/REFERRING PHYSICIAN:   CHIEF COMPLAINT:   Chief Complaint  Patient presents with  . Back Pain    HISTORY OF PRESENT ILLNESS: Madeline [name]  is a 65 y.o. female with a known history of diabetes mellitus and peripheral neuropathy, breast carcinoma status post left radical mastectomy, so this was 5 years of letrozole therapy completion in May 2015, status post recent Port-A-Cath removal, history of hyperlipidemia, hypertension, MS, COPD, right lower extremity DVT, gout, who presents to the hospital with complaints of intractable low back pain. According to patient as well as her daughter. Patient has been suffering with back pains for a while now. She's been seeing Dr. Vicente Serene for the past 7 years. However, over the past 3 days. She's been having severe pain in lower back pain is described as sharp, constant wasn't intermittent worsening episodes due to movements, with no significant radiation. Patient was in such significant pain that she could not even get up, off commode earlier today, she was not able to walk well. She presented emergency room where she had CT scan of her spine revealing multiple lytic lesions in thoracic and lumbar spine consistent with metastatic cancer, compression deformity was noted in T12, T10 lesion encroached upon the thecal sac, further evaluation with MRI was recommended. Patient tells me that she cannot get MRI due to metal in her body. Hospitalist services were contacted for admission. Since patient's pain is very poorly controlled.   PAST MEDICAL HISTORY:   Past Medical History  Diagnosis Date  . Depression   . Goiter, nodular     multi  . Dyslipidemia   . NIDDM, uncontrolled, with neuropathy   . Hypertension   . Allergy   . GERD  (gastroesophageal reflux disease)   . Urinary incontinence   . Peripheral neuropathy ([place])   . Multiple sclerosis ([place])   . Breast cancer ([place])   . Hypothyroidism   . Asthma   . COPD (chronic obstructive pulmonary disease) ([place])   . DVT (deep venous thrombosis) ([facility]) 1990's    right leg  . Gout   . Femur fracture ([place])   . Frequent UTI     PAST SURGICAL HISTORY: Past Surgical History  Procedure Laterality Date  . Thyroidectomy  09/2004  . Transthoracic echocardiogram  05/16/2004  . Partial hysterectomy  1975  . Axillary hidradenitis excision  1993    Excision biopsy growth right axilla, benign   . Other surgical history      Thyroid biopsy 10/99  . Carpal tunnel release  03/2008    bilateral  . Mastectomy, radical  02/2009    left modified  . Tonsillectomy    . Appendectomy    . Femur fracture surgery Left june 2016    SOCIAL HISTORY:  Social History  Substance Use Topics  . Smoking status: Current Every Day Smoker -- 0.00 packs/day for 50 years    Types: Cigarettes    Last Attempt to Quit: 07/12/2015  . Smokeless tobacco: Never Used     Comment: almost stopped, Pt states smokes 1 cigarette a day.  . Alcohol Use: No    FAMILY HISTORY:  Family History  Problem Relation Age of Onset  . Kidney failure Mother   . Heart disease Father 1  .  Heart disease Brother   . Heart disease Brother     DRUG ALLERGIES:  Allergies  Allergen Reactions  . Penicillins Swelling    tongue and throat swelling tolerates cephalosporins  . Sulfa Antibiotics Hives and Itching  . Trovan [Alatrofloxacin] Hives  . Rosiglitazone Maleate Swelling    Review of Systems  Constitutional: Positive for chills. Negative for fever, weight loss and malaise/fatigue.  HENT: Positive for congestion.   Eyes: Positive for blurred vision. Negative for double vision.  Respiratory: Positive for shortness of breath. Negative for cough, sputum production and wheezing.   Cardiovascular: Positive for  chest pain. Negative for palpitations, orthopnea, leg swelling and PND.  Gastrointestinal: Positive for abdominal pain and constipation. Negative for nausea, vomiting, diarrhea, blood in stool and melena.  Genitourinary: Positive for dysuria. Negative for urgency, frequency and hematuria.  Musculoskeletal: Positive for back pain. Negative for falls.  Skin: Negative for rash.  Neurological: Positive for weakness. Negative for dizziness.  Psychiatric/Behavioral: Negative for depression and memory loss. The patient is not nervous/anxious.     MEDICATIONS AT HOME:  Prior to Admission medications   Medication Sig Start Date End Date Taking? Authorizing Provider  albuterol (PROVENTIL) (2.5 MG/3ML) 0.083% nebulizer solution Take 3 mLs (2.5 mg total) by nebulization every 6 (six) hours as needed for wheezing or shortness of breath. 02/20/14   Venia Carbon, MD  allopurinol (ZYLOPRIM) 300 MG tablet Take 1 tablet (300 mg total) by mouth daily. 03/22/15   Venia Carbon, MD  aspirin 325 MG tablet Take 325 mg by mouth daily.      Historical Provider, MD  baclofen (LIORESAL) 10 MG tablet Limit 1 tablet by mouth twice a day to 3 times a day if tolerated 11/08/15   Mohammed Kindle, MD  budesonide-formoterol [facility]) 160-4.5 MCG/ACT inhaler Inhale 1 puff into the lungs 2 (two) times daily. 05/22/15   Venia Carbon, MD  Calcium Carbonate-Vitamin D (CALCIUM-VITAMIN D) 600-200 MG-UNIT CAPS Take by mouth 2 (two) times daily.     Historical Provider, MD  carvedilol (COREG) 25 MG tablet Take 1 tablet (25 mg total) by mouth 2 (two) times daily with a meal. 04/18/15   Venia Carbon, MD  Cholecalciferol (VITAMIN D3) 1000 UNITS CAPS Take 4,000 capsules by mouth every morning.    Historical Provider, MD  ciprofloxacin (CIPRO) 250 MG tablet Take 1 tablet (250 mg total) by mouth 2 (two) times daily. 10/31/15   Venia Carbon, MD  colchicine 0.6 MG tablet TAKE ONE TABLET TWICE DAILY AS NEEDED 12/10/15   Venia Carbon, MD  COPAXONE 40 MG/ML SOSY Inject 70ml (40mg ) subcutaneously three times a week. 12/20/14   Kathrynn Ducking, MD  diazepam (VALIUM) 5 MG tablet TAKE ONE (1) TABLET BY MOUTH TWO (2) TIMES DAILY 12/11/15   Venia Carbon, MD  fish oil-omega-3 fatty acids 1000 MG capsule Take 2 g by mouth daily.      Historical Provider, MD  FLUoxetine (PROZAC) 20 MG capsule TAKE 2 CAPSULES BY MOUTH EVERY DAY 01/12/15   Venia Carbon, MD  furosemide (LASIX) 80 MG tablet Take 1 tablet (80 mg total) by mouth daily. Take second dose around lunchtime if your legs are swelling 01/12/15   Venia Carbon, MD  gabapentin (NEURONTIN) 300 MG capsule Limit 1-2 tablets by mouth 2-4 times per day if tolerated 12/06/15   Mohammed Kindle, MD  glucose blood (ONE TOUCH TEST STRIPS) test strip Use as instructed to test blood sugar 3  times daily dx: E11.40 01/15/15   Venia Carbon, MD  insulin lispro (HUMALOG) 100 UNIT/ML KiwkPen Use according to sliding scale as directed 01/15/15   Ria Bush, MD  Insulin Pen Needle 30G X 5 MM MISC Use as instructed to inject insulin 2-3 times daily dx 250.60 05/28/15   Venia Carbon, MD  LANTUS SOLOSTAR 100 UNIT/ML Solostar Pen Inject 47 Units into the skin 2 (two) times daily.  03/27/15   Historical Provider, MD  levothyroxine (SYNTHROID, LEVOTHROID) 175 MCG tablet Take 1 tablet (175 mcg total) by mouth daily. 04/18/15   Venia Carbon, MD  meloxicam (MOBIC) 15 MG tablet Take 1 tablet (15 mg total) by mouth daily as needed. 09/18/15   Venia Carbon, MD  metFORMIN (GLUCOPHAGE) 1000 MG tablet Take 1 tablet (1,000 mg total) by mouth daily with breakfast. 09/18/15   Venia Carbon, MD  omeprazole (PRILOSEC) 20 MG capsule TAKE ONE CAPSULE BY MOUTH TWICE DAILY 01/12/15   Venia Carbon, MD  [facility] DELICA LANCETS [id number] MISC Use to test blood sugar three times daily dx: E11.40 06/04/15   Venia Carbon, MD  oxyCODONE (OXY IR/ROXICODONE) 5 MG immediate release tablet Limit 1 tablet by mouth 2-4  times per day if tolerated 12/06/15   Mohammed Kindle, MD  potassium chloride (KLOR-CON M10) 10 MEQ tablet TAKE 2 TABLETS BY MOUTH EVERY DAY 04/18/15   Venia Carbon, MD  tiotropium (SPIRIVA HANDIHALER) 18 MCG inhalation capsule PLACE 1 CAPSULE (18 MCG TOTAL) INTO INHALER AND INHALE DAILY AS NEEDED. 01/12/15   Venia Carbon, MD  traZODone (DESYREL) 100 MG tablet Take 2 tablets (200 mg total) by mouth at bedtime. 09/18/15   Venia Carbon, MD      PHYSICAL EXAMINATION:   VITAL SIGNS: Blood pressure 126/54, pulse 70, temperature 97.6 F (36.4 C), temperature source Oral, resp. rate 20, height 5\' 2"  (1.575 m), weight 122.471 kg (270 lb), SpO2 95 %.  GENERAL:  65 y.o.-year-old patient lying in the bed in moderate distress due to back pain, has difficulty sitting up or moving around in the bed, also stressed out due to recent diagnosis of metastatic cancer, which was expected for her emotional, tearful.  EYES: Pupils equal, round, reactive to light and accommodation. No scleral icterus. Extraocular muscles intact.  HEENT: Head atraumatic, normocephalic. Oropharynx and nasopharynx clear.  NECK:  Supple, no jugular venous distention. No thyroid enlargement, no tenderness.  LUNGS: Normal breath sounds bilaterally, no wheezing, rales,rhonchi or crepitation. No use of accessory muscles of respiration.  CARDIOVASCULAR: S1, S2 normal. No murmurs, rubs, or gallops.  ABDOMEN: Soft, nontender, nondistended. Bowel sounds present. No organomegaly or mass.  EXTREMITIES: No pedal edema, cyanosis, or clubbing. Palpation of spine revealed tenderness in lower thoracic upper lumbar area NEUROLOGIC: Cranial nerves II through XII are intact. Muscle strength 5/5 in all extremities. Sensation intact. Gait not checked.  PSYCHIATRIC: The patient is alert and oriented x 3.  SKIN: No obvious rash, lesion, or ulcer.   LABORATORY PANEL:   CBC  Recent Labs Lab 12/20/15 1650  WBC 7.7  HGB 11.8*  HCT 37.2  PLT 213   MCV 82.8  MCH 26.2  MCHC 31.6*  RDW 16.4*  LYMPHSABS 1.9  MONOABS 1.0*  EOSABS 0.5  BASOSABS 0.1   ------------------------------------------------------------------------------------------------------------------  Chemistries   Recent Labs Lab 12/20/15 1650  NA 144  K 4.0  CL 101  CO2 34*  GLUCOSE 74  BUN 17  CREATININE 0.80  CALCIUM 9.6  AST 29  ALT 24  ALKPHOS 145*  BILITOT 0.8   ------------------------------------------------------------------------------------------------------------------  Cardiac Enzymes No results for input(s): TROPONINI in the last 168 hours. ------------------------------------------------------------------------------------------------------------------  RADIOLOGY: Dg Lumbar Spine 2-3 Views  12/20/2015  CLINICAL DATA:  Low back pain for 3 days EXAM: LUMBAR SPINE - 2-3 VIEW COMPARISON:  None. FINDINGS: Extremely limited evaluation secondary to body habitus and technique. There is L1 vertebral body height loss concerning for a compression fracture of indeterminate age. Alignment is normal. Intervertebral disc spaces are maintained. IMPRESSION: Age-indeterminate L1 vertebral body compression fracture. Electronically Signed   By: Kathreen Devoid   On: 12/20/2015 12:41   Ct Renal Stone Study  12/20/2015  CLINICAL DATA:  Bilateral back pain for 3 days EXAM: CT ABDOMEN AND PELVIS WITHOUT CONTRAST TECHNIQUE: Multidetector CT imaging of the abdomen and pelvis was performed following the standard protocol without IV contrast. COMPARISON:  None. FINDINGS: Lung bases are free of acute infiltrate or sizable effusion. The liver, spleen, adrenal glands and pancreas are all normal in their CT appearance. The gallbladder is well distended with multiple dependent gallstones. No biliary obstructive changes are seen. Kidneys are well visualized bilaterally without evidence of renal calculi or urinary tract obstructive changes. The bladder is partially distended.  Aortoiliac calcifications are noted without aneurysmal dilatation. The appendix is within normal limits. The osseous structures show postsurgical changes in the proximal left femur. There also multiple lytic lesions consistent with metastatic disease. Significant destruction is noted in the T10 vertebral body on the right with some impingement upon the thecal sac. Lytic lesions are also noted anteriorly in the L1 vertebral body as well within the L3, L4 and L5 vertebral bodies. IMPRESSION: Cholelithiasis without complicating factors. Multiple lytic lesions within the thoracic and lumbar spine consistent with metastatic breast cancer. Compression deformity of uncertain chronicity is noted at T12. The T10 lesion encroaches upon the thecal sac. Further evaluation by means of MRI is recommended. Electronically Signed   By: Inez Catalina M.D.   On: 12/20/2015 16:33    EKG: Orders placed or performed during the hospital encounter of 05/05/15  . ED EKG  . ED EKG  . EKG    IMPRESSION AND PLAN:  Principal Problem:   Back pain Active Problems:   Metastatic carcinoma ([facility])   Dyspnea   Obesity  #1. Acute likely T12 compression fracture with resultant lower back pain, admitted to a medical floor initiated her on high doses of oxycodone, Dilaudid intravenously, get orthopedic surgeon involved for further recommendations #2. Metastatic carcinoma, likely breast. Get Dr. Grayland Ormond to see patient in consultation #3 anemia of unclear etiology at this time, likely anemia of chronic disease, get Hemoccult #4 hypertension, continue outpatient medications. #5. Hyperlipidemia, continue outpatient medications, #6. Dyspnea, likely COPD, rule out pulmonary embolism, get d-dimer and get CTA of the chest if needed   All the records are reviewed and case discussed with ED provider. Management plans discussed with the patient, family and they are in agreement.  CODE STATUS: Code Status History    This patient does not  have a recorded code status. Please follow your organizational policy for patients in this situation.    Advance Directive Documentation        Most Recent Value   Type of Advance Directive  Healthcare Power of Attorney   Pre-existing out of facility DNR order (yellow form or pink MOST form)     "MOST" Form in Place?         TOTAL TIME  TAKING CARE OF THIS PATIENT: 60 minutes.  Emotional support was provided  [employer] M.D on 12/20/2015 at 6:09 PM  Between 7am to 6pm - Pager - [phone] After 6pm go to [email or web address] - password [email or web address] [facility]  Office  [phone]  CC: Primary care physician; Viviana Simpler, MD

## 2015-12-20 NOTE — ED Notes (Signed)
This RN and Pam EDT explained to patient the delay in the MD coming to see her. Pt's daughter states that she understands and that her mother is "just impatient right now". Pt resting in bed with no acute distress noted at this time. Pt's daughter at bedside at this time.

## 2015-12-20 NOTE — ED Provider Notes (Addendum)
[facility] Emergency Department Provider Note  ____________________________________________   I have reviewed the triage vital signs and the nursing notes.   HISTORY  Chief Complaint Back Pain    HPI Madeline [name] is a 65 y.o. female with a history of cancer states that far as she can tell she was cancer free in December. They took out her port. She did have chemotherapy and radiation. Patient has chronic back pain and known spinal stenosis however she states that over the last 3 days the pain has become unbearable despite her home oxycodone. He denies any focal numbness or weakness she denies incontinence of bowel or bladder but she is saying she is having too much pain to get around the house. This is seems to be somewhat of a different pain than her normal. There is no radiculopathy to it. His diffuse lower back pain. She states sometimes she has dysuria, but no other UTI symptoms. She denies any trauma. She states after taking her oxycodone sometimes she can last a brief period of time but then she needs more pain medication and she cannot walk because it hurts so much not because she is weak.  Past Medical History  Diagnosis Date  . Depression   . Goiter, nodular     multi  . Dyslipidemia   . NIDDM, uncontrolled, with neuropathy   . Hypertension   . Allergy   . GERD (gastroesophageal reflux disease)   . Urinary incontinence   . Peripheral neuropathy ([place])   . Multiple sclerosis ([place])   . Breast cancer ([place])   . Hypothyroidism   . Asthma   . COPD (chronic obstructive pulmonary disease) ([place])   . DVT (deep venous thrombosis) (H[facility]) 1990's    right leg  . Gout   . Femur fracture ([place])   . Frequent UTI     Patient Active Problem List   Diagnosis Date Noted  . Acute cystitis 10/31/2015  . Femur fracture, left ([place]) 05/25/2015  . Neuropathy due to secondary diabetes ([place]) 04/13/2015  . DDD (degenerative disc disease), lumbar 04/13/2015  .  Lumbar facet arthropathy 04/13/2015  . 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 Memory loss 12/16/2013  . Edema 07/06/2013  . Gout   . Obstructive chronic bronchitis with exacerbation ([place]) 01/27/2013  . Routine general medical examination at a health care facility 09/27/2012  . COPD (chronic obstructive pulmonary disease) with emphysema ([place]) 04/01/2010  . Hyperlipemia 01/31/2010  . HYPOTHYROIDISM 06/01/2009  . ADENOCARCINOMA, BREAST, LEFT 02/08/2009  . Multiple sclerosis ([place]) 08/16/2008  . Type 2 diabetes mellitus with neurologic complication ([place]) 0000000  . ALLERGIC RHINITIS 06/24/2007  . GERD 06/24/2007  . URINARY INCONTINENCE 06/24/2007  . Episodic mood disorder ([place]) 06/05/2007  . Essential hypertension, benign 06/05/2007    Past Surgical History  Procedure Laterality Date  . Thyroidectomy  09/2004  . Transthoracic echocardiogram  05/16/2004  . Partial hysterectomy  1975  . Axillary hidradenitis excision  1993    Excision biopsy growth right axilla, benign   . Other surgical history      Thyroid biopsy 10/99  . Carpal tunnel release  03/2008    bilateral  . Mastectomy, radical  02/2009    left modified  . Tonsillectomy    . Appendectomy    . Femur fracture surgery Left june 2016    Current Outpatient Rx  Name  Route  Sig  Dispense  Refill  . albuterol (PROVENTIL) (2.5 MG/3ML) 0.083%  nebulizer solution   Nebulization   Take 3 mLs (2.5 mg total) by nebulization every 6 (six) hours as needed for wheezing or shortness of breath.   75 mL   11   . allopurinol (ZYLOPRIM) 300 MG tablet   Oral   Take 1 tablet (300 mg total) by mouth daily.   90 tablet   3   . aspirin 325 MG tablet   Oral   Take 325 mg by mouth daily.           . baclofen (LIORESAL) 10 MG tablet      Limit 1 tablet by mouth twice a day to 3 times a day if tolerated   80 each   0   . budesonide-formoterol (SYMBICORT) 160-4.5 MCG/ACT inhaler    Inhalation   Inhale 1 puff into the lungs 2 (two) times daily.   10.2 g   5   . Calcium Carbonate-Vitamin D (CALCIUM-VITAMIN D) 600-200 MG-UNIT CAPS   Oral   Take by mouth 2 (two) times daily.          . carvedilol (COREG) 25 MG tablet   Oral   Take 1 tablet (25 mg total) by mouth 2 (two) times daily with a meal.   180 tablet   3   . Cholecalciferol (VITAMIN D3) 1000 UNITS CAPS   Oral   Take 4,000 capsules by mouth every morning.         . ciprofloxacin (CIPRO) 250 MG tablet   Oral   Take 1 tablet (250 mg total) by mouth 2 (two) times daily.   20 tablet   0   . colchicine 0.6 MG tablet      TAKE ONE TABLET TWICE DAILY AS NEEDED   60 tablet   0   . COPAXONE 40 MG/ML SOSY      Inject 35ml (40mg ) subcutaneously three times a week.   36 Syringe   3     Dispense as written.   . diazepam (VALIUM) 5 MG tablet      TAKE ONE (1) TABLET BY MOUTH TWO (2) TIMES DAILY   60 tablet   0   . fish oil-omega-3 fatty acids 1000 MG capsule   Oral   Take 2 g by mouth daily.           Marland Kitchen FLUoxetine (PROZAC) 20 MG capsule      TAKE 2 CAPSULES BY MOUTH EVERY DAY   180 capsule   3   . furosemide (LASIX) 80 MG tablet   Oral   Take 1 tablet (80 mg total) by mouth daily. Take second dose around lunchtime if your legs are swelling   180 tablet   3     Take second dose around lunchtime if your legs are ...   . gabapentin (NEURONTIN) 300 MG capsule      Limit 1-2 tablets by mouth 2-4 times per day if tolerated   240 capsule   0   . glucose blood (ONE TOUCH TEST STRIPS) test strip      Use as instructed to test blood sugar 3 times daily dx: E11.40   300 each   3   . insulin lispro (HUMALOG) 100 UNIT/ML KiwkPen      Use according to sliding scale as directed   15 mL   3   . Insulin Pen Needle 30G X 5 MM MISC      Use as instructed to inject insulin 2-3 times daily dx 250.60  300 each   6   . LANTUS SOLOSTAR 100 UNIT/ML Solostar Pen   Subcutaneous   Inject  47 Units into the skin 2 (two) times daily.            Dispense as written.   Marland Kitchen levothyroxine (SYNTHROID, LEVOTHROID) 175 MCG tablet   Oral   Take 1 tablet (175 mcg total) by mouth daily.   90 tablet   3   . meloxicam (MOBIC) 15 MG tablet   Oral   Take 1 tablet (15 mg total) by mouth daily as needed.   90 tablet   3   . metFORMIN (GLUCOPHAGE) 1000 MG tablet   Oral   Take 1 tablet (1,000 mg total) by mouth daily with breakfast.   90 tablet   3   . omeprazole (PRILOSEC) 20 MG capsule      TAKE ONE CAPSULE BY MOUTH TWICE DAILY   180 capsule   3   . ONETOUCH DELICA LANCETS [id number] MISC      Use to test blood sugar three times daily dx: E11.40   300 each   0   . oxyCODONE (OXY IR/ROXICODONE) 5 MG immediate release tablet      Limit 1 tablet by mouth 2-4 times per day if tolerated   120 tablet   0   . potassium chloride (KLOR-CON M10) 10 MEQ tablet      TAKE 2 TABLETS BY MOUTH EVERY DAY   180 tablet   3   . tiotropium (SPIRIVA HANDIHALER) 18 MCG inhalation capsule      PLACE 1 CAPSULE (18 MCG TOTAL) INTO INHALER AND INHALE DAILY AS NEEDED.   90 capsule   3   . traZODone (DESYREL) 100 MG tablet   Oral   Take 2 tablets (200 mg total) by mouth at bedtime.   180 tablet   3     Allergies Penicillins; Sulfa antibiotics; Trovan; and Rosiglitazone maleate  Family History  Problem Relation Age of Onset  . Kidney failure Mother   . Heart disease Father 52  . Heart disease Brother   . Heart disease Brother     Social History Social History  Substance Use Topics  . Smoking status: Current Every Day Smoker -- 0.00 packs/day for 50 years    Types: Cigarettes    Last Attempt to Quit: 07/12/2015  . Smokeless tobacco: Never Used     Comment: almost stopped, Pt states smokes 1 cigarette a day.  . Alcohol Use: No    Review of Systems Constitutional: No fever/chills Eyes: No visual changes. ENT: No sore throat. No stiff neck no neck pain Cardiovascular:  Denies chest pain. Respiratory: Denies shortness of breath. Gastrointestinal:   no vomiting.  No diarrhea.  No constipation. Genitourinary: See history of present illness Musculoskeletal: Negative lower extremity swelling Skin: Negative for rash. Neurological: Negative for headaches, focal weakness or numbness. 10-point ROS otherwise negative.  ____________________________________________   PHYSICAL EXAM:  VITAL SIGNS: ED Triage Vitals  Enc Vitals Group     BP 12/20/15 1130 111/64 mmHg     Pulse Rate 12/20/15 1130 70     Resp 12/20/15 1135 20     Temp 12/20/15 1135 97.6 F (36.4 C)     Temp Source 12/20/15 1135 Oral     SpO2 12/20/15 1130 95 %     Weight 12/20/15 1135 270 lb (122.471 kg)     Height 12/20/15 1135 5\' 2"  (1.575 m)     Head Cir --  Peak Flow --      Pain Score 12/20/15 1136 6     Pain Loc --      Pain Edu? --      Excl. in [place]? --     Constitutional: Alert and oriented. Elderly woman very poorly conditioned but no acute distress Eyes: Conjunctivae are normal. PERRL. EOMI. Head: Atraumatic. Nose: No congestion/rhinnorhea. Mouth/Throat: Mucous membranes are moist.  Oropharynx non-erythematous. Neck: No stridor.   Nontender with no meningismus Cardiovascular: Normal rate, regular rhythm. Grossly normal heart sounds.  Good peripheral circulation. Respiratory: Normal respiratory effort.  No retractions. Lungs CTAB. Abdominal: Soft and nontender. No distention. No guarding no rebound Back:  Is diffuse tenderness to lower back including crossing the midline and multiple different places. No shingles noted. No significant CVA tenderness. Musculoskeletal: No lower extremity tenderness. No joint effusions, no DVT signs strong distal pulses no edema Neurologic:  Normal speech and language. No gross focal neurologic deficits are appreciated. Saddle anesthesia, patient is deconditioned and diffusely weak but not focally so. Skin:  Skin is warm, dry and intact. No rash  noted. Psychiatric: Mood and affect are normal. Speech and behavior are normal.  ____________________________________________   LABS (all labs ordered are listed, but only abnormal results are displayed)  Labs Reviewed  URINALYSIS COMPLETEWITH MICROSCOPIC ([place] ONLY) - Abnormal; Notable for the following:    Color, Urine YELLOW (*)    APPearance CLEAR (*)    Squamous Epithelial / LPF 0-5 (*)    All other components within normal limits  CBC WITH DIFFERENTIAL/PLATELET  COMPREHENSIVE METABOLIC PANEL  LIPASE, BLOOD   ____________________________________________  EKG  I personally interpreted any EKGs ordered by me or triage ______________________________________  RADIOLOGY  I reviewed any imaging ordered by me or triage that were performed during my shift ____________________________________________   PROCEDURES  Procedure(s) performed: None  Critical Care performed: None  ____________________________________________   INITIAL IMPRESSION / ASSESSMENT AND PLAN / ED COURSE  Pertinent labs & imaging results that were available during my care of the patient were reviewed by me and considered in my medical decision making (see chart for details).  Given patient age and cancer history did do a CT scan for this back pain which shows multiple different pathologies including that appear to be multiple lytic lesions within the thorax thoracic and lumbar spine. This is very concerning she was cancer free. I have made her aware of these things. Patient initially stated that she would try to go without more pain medication should she disagree and oxycodone does not like to take a lot of however the pain has become significant and she is requesting more pain medication which we will give her. There is a compression deformity noted as well. Patient cannot she states have an MRI because of hardware in her leg. We'll discuss with Dr. Grayland Ormond, her oncologist, given significant pain, and  multiple lytic lesions or backup think it probably would be appropriate to admit the patient to the hospital for further evaluation and pain management.  ----------------------------------------- 5:32 PM on 12/20/2015 -----------------------------------------  Admitting patient for pain control and further evaluation. Patient is DNR/DNI at this time she thinks that she likely will not pursue any acute treatment of the cancer. Her goal is to be made comfortable from this pain.  ____________________________________________   FINAL CLINICAL IMPRESSION(S) / ED DIAGNOSES  Final diagnoses:  Back pain      This chart was dictated using voice recognition software.  Despite best efforts to proofread,  errors can occur which can change meaning.     Schuyler Amor, MD 12/20/15 1718  Schuyler Amor, MD 12/20/15 [phone]

## 2015-12-20 NOTE — ED Notes (Signed)
Pt stated "I need to go to the bathroom, I'm about to piss myself". This RN offered bedpan or assistance to the bathroom. Pt refused either. Pt states "I can't use the bedpan my back hurts". Pt then states when asked if she could walk "I wouldn't be laying here if my back didn't hurt, now help me pull my gown up so I can piss in this bed". This RN and Juanetta, EDT, assisted patient to a bedside commode. Pt crying out at this time about pain. Pt assisted back to bed with no difficulty at this time. Pt resettled in the bed. MD notified of patient's pain at this time.

## 2015-12-21 ENCOUNTER — Inpatient Hospital Stay: Payer: Medicare Other

## 2015-12-21 LAB — GLUCOSE, CAPILLARY
GLUCOSE-CAPILLARY: 106 mg/dL — AB (ref 65–99)
GLUCOSE-CAPILLARY: 191 mg/dL — AB (ref 65–99)
Glucose-Capillary: 124 mg/dL — ABNORMAL HIGH (ref 65–99)
Glucose-Capillary: 146 mg/dL — ABNORMAL HIGH (ref 65–99)

## 2015-12-21 LAB — CBC
HCT: 34.8 % — ABNORMAL LOW (ref 35.0–47.0)
Hemoglobin: 11.3 g/dL — ABNORMAL LOW (ref 12.0–16.0)
MCH: 26.6 pg (ref 26.0–34.0)
MCHC: 32.5 g/dL (ref 32.0–36.0)
MCV: 81.7 fL (ref 80.0–100.0)
PLATELETS: 200 10*3/uL (ref 150–440)
RBC: 4.25 MIL/uL (ref 3.80–5.20)
RDW: 16.5 % — AB (ref 11.5–14.5)
WBC: 7.6 10*3/uL (ref 3.6–11.0)

## 2015-12-21 LAB — BASIC METABOLIC PANEL
Anion gap: 6 (ref 5–15)
BUN: 15 mg/dL (ref 6–20)
CALCIUM: 9 mg/dL (ref 8.9–10.3)
CHLORIDE: 104 mmol/L (ref 101–111)
CO2: 33 mmol/L — ABNORMAL HIGH (ref 22–32)
CREATININE: 0.69 mg/dL (ref 0.44–1.00)
GFR calc non Af Amer: >60 mL/min (ref >60)
Glucose, Bld: 111 mg/dL — ABNORMAL HIGH (ref 65–99)
Potassium: 4 mmol/L (ref 3.5–5.1)
SODIUM: 143 mmol/L (ref 135–145)

## 2015-12-21 LAB — HEMOGLOBIN A1C: HEMOGLOBIN A1C: 7.1 % — AB (ref 4.0–6.0)

## 2015-12-21 MED ORDER — ENOXAPARIN SODIUM 40 MG/0.4ML ~~LOC~~ SOLN
40.0000 mg | Freq: Two times a day (BID) | SUBCUTANEOUS | Status: DC
  Administered 2015-12-21 – 2015-12-23 (×5): 40 mg via SUBCUTANEOUS
  Filled 2015-12-21 (×5): qty 0.4

## 2015-12-21 MED ORDER — GABAPENTIN 300 MG PO CAPS
600.0000 mg | ORAL_CAPSULE | Freq: Four times a day (QID) | ORAL | Status: DC
  Administered 2015-12-21 – 2015-12-27 (×22): 600 mg via ORAL
  Filled 2015-12-21 (×21): qty 2

## 2015-12-21 MED ORDER — POTASSIUM CHLORIDE CRYS ER 20 MEQ PO TBCR
20.0000 meq | EXTENDED_RELEASE_TABLET | Freq: Every day | ORAL | Status: DC
  Administered 2015-12-21 – 2015-12-27 (×7): 20 meq via ORAL
  Filled 2015-12-21 (×7): qty 1

## 2015-12-21 MED ORDER — DIAZEPAM 5 MG PO TABS
5.0000 mg | ORAL_TABLET | Freq: Two times a day (BID) | ORAL | Status: DC
  Administered 2015-12-21 – 2015-12-27 (×12): 5 mg via ORAL
  Filled 2015-12-21 (×12): qty 1

## 2015-12-21 MED ORDER — POTASSIUM CHLORIDE 20 MEQ PO PACK: 20.0000 meq | PACK | Freq: Two times a day (BID) | ORAL | Status: DC

## 2015-12-21 NOTE — Progress Notes (Addendum)
[facility] [place] NAME: Madeline [name]    MR#:  [id number]  DATE OF BIRTH:  1951/06/10  SUBJECTIVE:  CHIEF COMPLAINT:   Chief Complaint  Patient presents with  . Back Pain   back pain is better with the pain medication.  REVIEW OF SYSTEMS:  CONSTITUTIONAL: No fever, generalized weakness.  EYES: No blurred or double vision.  EARS, NOSE, AND THROAT: No tinnitus or ear pain.  RESPIRATORY: No cough, shortness of breath, wheezing or hemoptysis.  CARDIOVASCULAR: No chest pain, orthopnea, edema.  GASTROINTESTINAL: No nausea, vomiting, diarrhea or abdominal pain.  GENITOURINARY: No dysuria, hematuria.  ENDOCRINE: No polyuria, nocturia,  HEMATOLOGY: No anemia, easy bruising or bleeding SKIN: No rash or lesion. MUSCULOSKELETAL: Back pain.   NEUROLOGIC: No tingling, numbness, weakness.  PSYCHIATRY: No anxiety or depression.   DRUG ALLERGIES:   Allergies  Allergen Reactions  . Penicillins Swelling    tongue and throat swelling tolerates cephalosporins  . Sulfa Antibiotics Hives and Itching  . Trovan [Alatrofloxacin] Hives  . Rosiglitazone Maleate Swelling    VITALS:  Blood pressure 126/57, pulse 72, temperature 97.7 F (36.5 C), temperature source Oral, resp. rate 20, height 5\' 2"  (1.575 m), weight 122.471 kg (270 lb), SpO2 98 %.  PHYSICAL EXAMINATION:  GENERAL:  65 y.o.-year-old patient lying in the bed with no acute distress. Morbid obesity. EYES: Pupils equal, round, reactive to light and accommodation. No scleral icterus. Extraocular muscles intact.  HEENT: Head atraumatic, normocephalic. Oropharynx and nasopharynx clear.  NECK:  Supple, no jugular venous distention. No thyroid enlargement, no tenderness.  LUNGS: Normal breath sounds bilaterally, no wheezing, rales,rhonchi or crepitation. No use of accessory muscles of respiration.  CARDIOVASCULAR: S1, S2 normal. No murmurs, rubs, or gallops.  ABDOMEN: Soft, nontender,  nondistended. Bowel sounds present. No organomegaly or mass.  EXTREMITIES: No pedal edema, cyanosis, or clubbing.  NEUROLOGIC: Cranial nerves II through XII are intact. Muscle strength 4/5 in lower extremities. Sensation intact. Gait not checked.  PSYCHIATRIC: The patient is alert and oriented x 3.  SKIN: No obvious rash, lesion, or ulcer.    LABORATORY PANEL:   CBC  Recent Labs Lab 12/21/15 0502  WBC 7.6  HGB 11.3*  HCT 34.8*  PLT 200   ------------------------------------------------------------------------------------------------------------------  Chemistries   Recent Labs Lab 12/20/15 1650 12/21/15 0502  NA 144 143  K 4.0 4.0  CL 101 104  CO2 34* 33*  GLUCOSE 74 111*  BUN 17 15  CREATININE 0.80 0.69  CALCIUM 9.6 9.0  AST 29  --   ALT 24  --   ALKPHOS 145*  --   BILITOT 0.8  --    ------------------------------------------------------------------------------------------------------------------  Cardiac Enzymes No results for input(s): TROPONINI in the last 168 hours. ------------------------------------------------------------------------------------------------------------------  RADIOLOGY:  Dg Lumbar Spine 2-3 Views  12/20/2015  CLINICAL DATA:  Low back pain for 3 days EXAM: LUMBAR SPINE - 2-3 VIEW COMPARISON:  None. FINDINGS: Extremely limited evaluation secondary to body habitus and technique. There is L1 vertebral body height loss concerning for a compression fracture of indeterminate age. Alignment is normal. Intervertebral disc spaces are maintained. IMPRESSION: Age-indeterminate L1 vertebral body compression fracture. Electronically Signed   By: Kathreen Devoid   On: 12/20/2015 12:41   Ct Angio Chest Pe W/cm &/or Wo Cm  12/20/2015  CLINICAL DATA:  History of breast cancer. Patient complains of chronic back pain worsened over last 3 days becoming unbearable. EXAM: CT ANGIOGRAPHY CHEST WITH CONTRAST TECHNIQUE: Multidetector CT  imaging of the chest was performed  using the standard protocol during bolus administration of intravenous contrast. Multiplanar CT image reconstructions and MIPs were obtained to evaluate the vascular anatomy. CONTRAST:  125mL OMNIPAQUE IOHEXOL 350 MG/ML SOLN COMPARISON:  December 16, 2014 FINDINGS: There is no pulmonary embolus. There is atherosclerosis of the thoracic aorta without dissection or aneurysm. The heart size is normal. There is no pericardial effusion. There is no mediastinal or hilar lymphadenopathy. Patient status post prior left breast surgery with postsurgical scar and clips. Images of the lungs demonstrate tiny focal peripheral scar of the bilateral lung apices unchanged compared to prior exam. There is no pulmonary mass or focal pneumonia. There is no pleural effusion. Images of the visualized upper abdominal structures demonstrate gallstones within the gallbladder. The other visualized upper abdominal structures are unremarkable. There is deformity of the anterior aspect of the left mid kidney not completely included. Images of the bones demonstrate mixed lucency and sclerosis identified throughout the spine consistent consistent with bone metastasis. This is worse at the T10 level with marked bony destruction with abnormal soft tissue extending into the spinal canal narrowing the spinal canal and obliterating the right neural foraman. Review of the MIP images confirms the above findings. IMPRESSION: No pulmonary embolus. No acute abnormality identified in the chest. 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 The the metastatic changes worst at the T10 level with marked bony destruction and abnormal soft tissue extending into the spinal canal, narrowing the spinal canal and obliterating the right neural foramen. Core compression is not excluded. Further evaluation with MRI is recommended. These results will be called to the ordering clinician or representative by the Radiologist Assistant, and communication  documented in the PACS or zVision Dashboard. Electronically Signed   By: Abelardo Diesel M.D.   On: 12/20/2015 20:19   Ct Renal Stone Study  12/20/2015  CLINICAL DATA:  Bilateral back pain for 3 days EXAM: CT ABDOMEN AND PELVIS WITHOUT CONTRAST TECHNIQUE: Multidetector CT imaging of the abdomen and pelvis was performed following the standard protocol without IV contrast. COMPARISON:  None. FINDINGS: Lung bases are free of acute infiltrate or sizable effusion. The liver, spleen, adrenal glands and pancreas are all normal in their CT appearance. The gallbladder is well distended with multiple dependent gallstones. No biliary obstructive changes are seen. Kidneys are well visualized bilaterally without evidence of renal calculi or urinary tract obstructive changes. The bladder is partially distended. Aortoiliac calcifications are noted without aneurysmal dilatation. The appendix is within normal limits. The osseous structures show postsurgical changes in the proximal left femur. There also multiple lytic lesions consistent with metastatic disease. Significant destruction is noted in the T10 vertebral body on the right with some impingement upon the thecal sac. Lytic lesions are also noted anteriorly in the L1 vertebral body as well within the L3, L4 and L5 vertebral bodies. IMPRESSION: Cholelithiasis without complicating factors. Multiple lytic lesions within the thoracic and lumbar spine consistent with metastatic breast cancer. Compression deformity of uncertain chronicity is noted at T12. The T10 lesion encroaches upon the thecal sac. Further evaluation by means of MRI is recommended. Electronically Signed   By: Inez Catalina M.D.   On: 12/20/2015 16:33    EKG:   Orders placed or performed during the hospital encounter of 05/05/15  . ED EKG  . ED EKG  . EKG    ASSESSMENT AND PLAN:   #1. Acute T12 compression fracture with resultant lower back pain, Continue high doses of oxycodone,  Dilaudid prn, f/u  orthopedic surgeon involved for further recommendations.  #2. 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 Grayland Ormond.  #3 anemia of unclear etiology at this time, likely anemia of chronic disease,  Stable. #4 hypertension, continue outpatient medications. #5. Hyperlipidemia, continue outpatient medications, #6. COPD, stable, ruled out pulmonary embolism per CTA.  * DM2. Continue Lantus and a sliding scale. * Tobacco abuse. Smoking cessation was counseled for 3 minutes. All the records are reviewed and case discussed with Care Management/Social Workerr. Management plans discussed with the patient, her daughter and they are in agreement.  CODE STATUS: Full code  TOTAL TIME TAKING CARE OF THIS PATIENT: 42 minutes.  Greater than 50% time was spent on coordination of care and face-to-face counseling.  POSSIBLE D/C IN 2 DAYS, DEPENDING ON CLINICAL CONDITION.   Demetrios Loll M.D on 12/21/2015 at 3:21 PM  Between 7am to 6pm - Pager - [phone]  After 6pm go to [email or web address] - password [email or web address] [facility]  Office  [phone]  CC: Primary care physician; Viviana Simpler, MD

## 2015-12-21 NOTE — Consult Note (Signed)
ORTHOPAEDIC CONSULTATION  REQUESTING PHYSICIAN: Demetrios Loll, MD  Chief Complaint:   Low back pain.  History of Present Illness: Madeline [name] is a 65 y.o. female with a long history of chronic lower back pain secondary to lumbar degenerative disc disease and stenosis who is treated by Dr. Primus Bravo with chronic narcotic medication. Apparently, over the past week or so, the patient has noted worsening lower back pain which progressed to where she was no longer able to get off the commode or to ambulate, prompting her family to bring her into the emergency room yesterday. Lumbar spine films suggested an L1 compression fracture of uncertain age, prompting a request for orthopedic consultation. The patient denies any falls recently and denies any other possible cause or injury to have precipitated her recent worsening in symptoms. The patient does have a history of breast cancer treated with a mastectomy in 2010 followed by extensive chemotherapy. However, she has not undergone any recent imaging to look for metastatic spread. The patient denies any radiation of pain to either lower extremity, nor does she note any numbness or paresthesias. She also denies any bowel or bladder complaints, other than chronic constipation which she feels is due to her chronic opioid use. The patient also denies any constitutional symptoms.  Past Medical History  Diagnosis Date  . Depression   . Goiter, nodular     multi  . Dyslipidemia   . NIDDM, uncontrolled, with neuropathy   . Hypertension   . Allergy   . GERD (gastroesophageal reflux disease)   . Urinary incontinence   . Peripheral neuropathy ([place])   . Multiple sclerosis ([place])   . Breast cancer ([place])   . Hypothyroidism   . Asthma   . COPD (chronic obstructive pulmonary disease) ([place])   . DVT (deep venous thrombosis) ([facility]) 1990's    right leg  . Gout   . Femur fracture ([place])   . Frequent UTI     Past Surgical History  Procedure Laterality Date  . Thyroidectomy  09/2004  . Transthoracic echocardiogram  05/16/2004  . Partial hysterectomy  1975  . Axillary hidradenitis excision  1993    Excision biopsy growth right axilla, benign   . Other surgical history      Thyroid biopsy 10/99  . Carpal tunnel release  03/2008    bilateral  . Mastectomy, radical  02/2009    left modified  . Tonsillectomy    . Appendectomy    . Femur fracture surgery Left june 2016   Social History   Social History  . Marital Status: Widowed    Spouse Name: N/A  . Number of Children: 4  . 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 Smoking status: Current Every Day Smoker -- 0.00 packs/day for 50 years    Types: Cigarettes    Last Attempt to Quit: 07/12/2015  . Smokeless tobacco: Never Used     Comment: almost stopped, Pt states smokes 1 cigarette a day.  . Alcohol Use: No  . Drug Use: No  . Sexual Activity: Not Asked   Other Topics Concern  . None   Social History Narrative   Widowed 1999 then 2nd Marriage--2000. Widowed again 2009   Living with daughter now   Has living will   Daughter [name] is health care POA.   Would accept rescitation but no prolonged artificial ventilation  No feeding tube if cognitively unaware   Family History  Problem Relation Age of Onset  . Kidney failure Mother   . Heart disease Father 3  . Heart disease Brother   . Heart disease Brother    Allergies  Allergen Reactions  . Penicillins Swelling    tongue and throat swelling tolerates cephalosporins  . Sulfa Antibiotics Hives and Itching  . Trovan [Alatrofloxacin] Hives  . Rosiglitazone Maleate Swelling   Prior to Admission medications   Medication Sig Start Date End Date Taking? Authorizing Provider  allopurinol (ZYLOPRIM) 300 MG tablet Take 1 tablet (300 mg total) by mouth daily. 03/22/15  Yes Venia Carbon, MD  aspirin 325 MG tablet Take 325 mg by mouth daily.     Yes Historical Provider, MD  baclofen (LIORESAL) 10 MG tablet Limit 1 tablet by mouth twice a day to 3 times a day if tolerated 11/08/15  Yes Mohammed Kindle, MD  budesonide-formoterol [facility]) 160-4.5 MCG/ACT inhaler Inhale 1 puff into the lungs 2 (two) times daily. 05/22/15  Yes Venia Carbon, MD  Calcium Carbonate-Vitamin D (CALCIUM-VITAMIN D) 600-200 MG-UNIT CAPS Take by mouth 2 (two) times daily.    Yes Historical Provider, MD  carvedilol (COREG) 25 MG tablet Take 1 tablet (25 mg total) by mouth 2 (two) times daily with a meal. 04/18/15  Yes Venia Carbon, MD  Cholecalciferol (VITAMIN D3) 1000 UNITS CAPS Take 4,000 capsules by mouth every morning.   Yes Historical Provider, MD  colchicine 0.6 MG tablet TAKE ONE TABLET TWICE DAILY AS NEEDED 12/10/15  Yes Venia Carbon, MD  COPAXONE 40 MG/ML SOSY Inject 55ml (40mg ) subcutaneously three times a week. 12/20/14  Yes Kathrynn Ducking, MD  diazepam (VALIUM) 5 MG tablet TAKE ONE (1) TABLET BY MOUTH TWO (2) TIMES DAILY 12/11/15  Yes Venia Carbon, MD  fish oil-omega-3 fatty acids 1000 MG capsule Take 2 g by mouth daily.     Yes Historical Provider, MD  FLUoxetine (PROZAC) 20 MG capsule TAKE 2 CAPSULES BY MOUTH EVERY DAY 01/12/15  Yes Venia Carbon, MD  furosemide (LASIX) 80 MG tablet Take 1 tablet (80 mg total) by mouth daily. Take second dose around lunchtime if your legs are swelling 01/12/15  Yes Venia Carbon, MD  gabapentin (NEURONTIN) 300 MG capsule Limit 1-2 tablets by mouth 2-4 times per day if tolerated 12/06/15  Yes Mohammed Kindle, MD  glucose blood (ONE TOUCH TEST STRIPS) test strip Use as instructed to test blood sugar 3 times daily dx: E11.40 01/15/15  Yes Venia Carbon, MD  insulin lispro (HUMALOG) 100 UNIT/ML KiwkPen Use according to sliding scale as directed 01/15/15  Yes Ria Bush, MD  Insulin Pen Needle 30G X 5 MM MISC Use as instructed to inject insulin 2-3  times daily dx 250.60 05/28/15  Yes Venia Carbon, MD  LANTUS SOLOSTAR 100 UNIT/ML Solostar Pen Inject 47 Units into the skin 2 (two) times daily.  03/27/15  Yes Historical Provider, MD  levothyroxine (SYNTHROID, LEVOTHROID) 175 MCG tablet Take 1 tablet (175 mcg total) by mouth daily. 04/18/15  Yes Venia Carbon, MD  meloxicam (MOBIC) 15 MG tablet Take 1 tablet (15 mg total) by mouth daily as needed. 09/18/15  Yes Venia Carbon, MD  metFORMIN (GLUCOPHAGE) 1000 MG tablet Take 1 tablet (1,000 mg total) by mouth daily with breakfast. 09/18/15  Yes Venia Carbon, MD  omeprazole (PRILOSEC) 20 MG capsule TAKE ONE CAPSULE BY MOUTH TWICE DAILY 01/12/15  Yes Venia Carbon, MD  [facility] LANCETS [id number] MISC Use to test blood sugar three times daily dx: E11.40 06/04/15  Yes Venia Carbon, MD  oxyCODONE (OXY IR/ROXICODONE) 5 MG immediate release tablet Limit 1 tablet by mouth 2-4 times per day if tolerated 12/06/15  Yes Mohammed Kindle, MD  potassium chloride (KLOR-CON M10) 10 MEQ tablet TAKE 2 TABLETS BY MOUTH EVERY DAY 04/18/15  Yes Venia Carbon, MD  tiotropium (SPIRIVA HANDIHALER) 18 MCG inhalation capsule PLACE 1 CAPSULE (18 MCG TOTAL) INTO INHALER AND INHALE DAILY AS NEEDED. 01/12/15  Yes Venia Carbon, MD  traZODone (DESYREL) 100 MG tablet Take 2 tablets (200 mg total) by mouth at bedtime. 09/18/15  Yes Venia Carbon, MD  albuterol (PROVENTIL) (2.5 MG/3ML) 0.083% nebulizer solution Take 3 mLs (2.5 mg total) by nebulization every 6 (six) hours as needed for wheezing or shortness of breath. 02/20/14   Venia Carbon, MD  ciprofloxacin (CIPRO) 250 MG tablet Take 1 tablet (250 mg total) by mouth 2 (two) times daily. Patient not taking: Reported on 12/20/2015 10/31/15   Venia Carbon, MD   Dg Lumbar Spine 2-3 Views  12/20/2015  CLINICAL DATA:  Low back pain for 3 days EXAM: LUMBAR SPINE - 2-3 VIEW COMPARISON:  None. FINDINGS: Extremely limited evaluation secondary to body habitus and technique.  There is L1 vertebral body height loss concerning for a compression fracture of indeterminate age. Alignment is normal. Intervertebral disc spaces are maintained. IMPRESSION: Age-indeterminate L1 vertebral body compression fracture. Electronically Signed   By: Kathreen Devoid   On: 12/20/2015 12:41   Ct Angio Chest Pe W/cm &/or Wo Cm  12/20/2015  CLINICAL DATA:  History of breast cancer. Patient complains of chronic back pain worsened over last 3 days becoming unbearable. EXAM: CT ANGIOGRAPHY CHEST WITH CONTRAST TECHNIQUE: Multidetector CT imaging of the chest was performed using the standard protocol during bolus administration of intravenous contrast. Multiplanar CT image reconstructions and MIPs were obtained to evaluate the vascular anatomy. CONTRAST:  118mL OMNIPAQUE IOHEXOL 350 MG/ML SOLN COMPARISON:  December 16, 2014 FINDINGS: There is no pulmonary embolus. There is atherosclerosis of the thoracic aorta without dissection or aneurysm. The heart size is normal. There is no pericardial effusion. There is no mediastinal or hilar lymphadenopathy. Patient status post prior left breast surgery with postsurgical scar and clips. Images of the lungs demonstrate tiny focal peripheral scar of the bilateral lung apices unchanged compared to prior exam. There is no pulmonary mass or focal pneumonia. There is no pleural effusion. Images of the visualized upper abdominal structures demonstrate gallstones within the gallbladder. The other visualized upper abdominal structures are unremarkable. There is deformity of the anterior aspect of the left mid kidney not completely included. Images of the bones demonstrate mixed lucency and sclerosis identified throughout the spine consistent consistent with bone metastasis. This is worse at the T10 level with marked bony destruction with abnormal soft tissue extending into the spinal canal narrowing the spinal canal and obliterating the right neural foraman. Review of the MIP images  confirms the above findings. IMPRESSION: No pulmonary embolus. No acute abnormality identified in the chest. 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 The the metastatic changes worst at the T10 level with marked bony destruction and abnormal soft tissue extending into the spinal canal, narrowing the spinal canal and obliterating the right neural foramen. Core compression is not excluded. Further evaluation with MRI is recommended. These results will be called to the ordering clinician  or representative by the Radiologist Assistant, and communication documented in the PACS or zVision Dashboard. Electronically Signed   By: Abelardo Diesel M.D.   On: 12/20/2015 20:19   Ct Renal Stone Study  12/20/2015  CLINICAL DATA:  Bilateral back pain for 3 days EXAM: CT ABDOMEN AND PELVIS WITHOUT CONTRAST TECHNIQUE: Multidetector CT imaging of the abdomen and pelvis was performed following the standard protocol without IV contrast. COMPARISON:  None. FINDINGS: Lung bases are free of acute infiltrate or sizable effusion. The liver, spleen, adrenal glands and pancreas are all normal in their CT appearance. The gallbladder is well distended with multiple dependent gallstones. No biliary obstructive changes are seen. Kidneys are well visualized bilaterally without evidence of renal calculi or urinary tract obstructive changes. The bladder is partially distended. Aortoiliac calcifications are noted without aneurysmal dilatation. The appendix is within normal limits. The osseous structures show postsurgical changes in the proximal left femur. There also multiple lytic lesions consistent with metastatic disease. Significant destruction is noted in the T10 vertebral body on the right with some impingement upon the thecal sac. Lytic lesions are also noted anteriorly in the L1 vertebral body as well within the L3, L4 and L5 vertebral bodies. IMPRESSION: Cholelithiasis without complicating factors. Multiple lytic lesions  within the thoracic and lumbar spine consistent with metastatic breast cancer. Compression deformity of uncertain chronicity is noted at T12. The T10 lesion encroaches upon the thecal sac. Further evaluation by means of MRI is recommended. Electronically Signed   By: Inez Catalina M.D.   On: 12/20/2015 16:33    Positive ROS: All other systems have been reviewed and were otherwise negative with the exception of those mentioned in the HPI and as above.  Physical Exam: General:  Alert, no acute distress Psychiatric:  Patient is competent for consent with normal mood and affect   Cardiovascular:  No pedal edema Respiratory:  No wheezing, non-labored breathing GI:  Abdomen is soft and non-tender Skin:  No lesions in the area of chief complaint Neurologic:  Sensation intact distally Lymphatic:  No axillary or cervical lymphadenopathy  Orthopedic Exam:  Orthopedic examination is limited to the lower back and both lower extremities. The patient has reproduction of her lower back pain with attempted log rolling as well as shifting in bed. She has mild tenderness to percussion over the mid and lower lumbar spine. There is no skin abnormalities. She is neurovascularly intact to both lower extremities as sensation is decreased symmetrically but present to all distributions in both lower extremities. She has good capillary refill to both feet, and can actively dorsiflex and plantarflex her toes and invert and evert both ankles with good strength.  X-rays:  Recent x-rays of the lumbar spine are available for review. These findings are as described above. In addition, a recent CT scan of the abdomen also is available for review. This scan, by report, demonstrates apparent lytic lesions at multiple levels of the lower thoracic and lumbar spine, possibly consistent with metastatic disease. These lesions are located at T10, L1, L3, L4, and L5.  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  Plan: The treatment options are discussed with the patient and her daughter, who was at the bedside. Normally, the patient might be a good candidate for a kyphoplasty of L1, especially if this is an acute or subacute injury. However, the presence of probable metastatic disease in her lumbar spine warrants  further workup by oncology. Normally, these lesions would be treated with radiation therapy rather than a kyphoplasty. According to the patient, the oncologist has been consult did and they are waiting his or her visit to the room. Based on the recommendations by the oncologist, the patient might benefit from either an MRI scan of the lumbar spine or a PET scan to further delineate the extent of her metastatic disease. If the oncologist feels that a kyphoplasty might be of benefit to the patient, I would be happy to ask my partner, Dr. Rudene Christians, to evaluate the patient for this procedure. Meanwhile, the patient should be provided medication as necessary for comfort. She may be mobilized with physical therapy as symptoms permit.  Thank you for ask me to participate in the care of this most unfortunate woman. I will be happy to follow her with you.   Pascal Lux, MD  Beeper #:  [phone]  12/21/2015 5:51 PM

## 2015-12-21 NOTE — Care Management (Signed)
Admitted to [facility] with back pain. Daughter Loletha Grayer lives with her [phone]). Seen Dr. Silvio Pate before Christmas. Femur fracture surgery per Dr. Dorothyann Peng at [facility] June 2016. Last seen Dr. Primus Bravo at the pain clinic 12/06/15. Receiving Physical therapy thru [facility] (maybe ending soon). Home oxygen thru [place] x 6 years. Life Alert in the home. Rolling Walker, rollator, regular wheel chair and electric wheelchair in the home.  Self feed and dress. Needs help with bath.  Shelbie Ammons RN MSN CCM Care Management [phone]

## 2015-12-21 NOTE — Progress Notes (Signed)
Spoke with Dr. Darvin Neighbours about pt gabapentin dose. Per MD change to 600mg  4 times daily.

## 2015-12-22 DIAGNOSIS — Z9012 Acquired absence of left breast and nipple: Secondary | ICD-10-CM

## 2015-12-22 DIAGNOSIS — I10 Essential (primary) hypertension: Secondary | ICD-10-CM

## 2015-12-22 DIAGNOSIS — G8929 Other chronic pain: Secondary | ICD-10-CM

## 2015-12-22 DIAGNOSIS — Z853 Personal history of malignant neoplasm of breast: Secondary | ICD-10-CM

## 2015-12-22 DIAGNOSIS — Z9223 Personal history of estrogen therapy: Secondary | ICD-10-CM

## 2015-12-22 DIAGNOSIS — Z170 Estrogen receptor positive status [ER+]: Secondary | ICD-10-CM

## 2015-12-22 DIAGNOSIS — M5136 Other intervertebral disc degeneration, lumbar region: Secondary | ICD-10-CM

## 2015-12-22 DIAGNOSIS — F17210 Nicotine dependence, cigarettes, uncomplicated: Secondary | ICD-10-CM

## 2015-12-22 DIAGNOSIS — M899 Disorder of bone, unspecified: Secondary | ICD-10-CM

## 2015-12-22 DIAGNOSIS — E1140 Type 2 diabetes mellitus with diabetic neuropathy, unspecified: Secondary | ICD-10-CM

## 2015-12-22 DIAGNOSIS — J449 Chronic obstructive pulmonary disease, unspecified: Secondary | ICD-10-CM

## 2015-12-22 DIAGNOSIS — M546 Pain in thoracic spine: Secondary | ICD-10-CM

## 2015-12-22 DIAGNOSIS — E1165 Type 2 diabetes mellitus with hyperglycemia: Secondary | ICD-10-CM

## 2015-12-22 LAB — GLUCOSE, CAPILLARY
GLUCOSE-CAPILLARY: 165 mg/dL — AB (ref 65–99)
GLUCOSE-CAPILLARY: 149 mg/dL — AB (ref 65–99)
GLUCOSE-CAPILLARY: 133 mg/dL — AB (ref 65–99)
Glucose-Capillary: 101 mg/dL — ABNORMAL HIGH (ref 65–99)
Glucose-Capillary: 169 mg/dL — ABNORMAL HIGH (ref 65–99)

## 2015-12-22 NOTE — Consult Note (Signed)
[place] [facility] [phone]  [phone]     ISSABELLE [name] OB: 1951/01/08  MR#: [id number]  INO#:[id number]  Patient Care Team: Venia Carbon, MD as PCP - General  CHIEF COMPLAINT:  Chief Complaint  Patient presents with  . Back Pain     No history exists.    Oncology Flowsheet 05/05/2015 12/20/2015 12/21/2015 12/21/2015 12/22/2015  diazepam (VALIUM) PO - - 5 mg   5 mg  enoxaparin (LOVENOX) [place] - 40 mg 40 mg 40 mg -  ondansetron (ZOFRAN) IV 4 mg - - - -    HISTORY OF PRESENT ILLNESS:    Tumor Staging Tumor Staging   Tumor Staging Source Pathological   Tumor T2   Node N2a   Metastasis M0   Stage III A   ER Status positive   PR Status positive   HER-2 Status positive   Cancer Status No evidence of disease clinically   Stage/Additional Pathology 8 of 18 lymph nodes positive for carcinoma.  Grade 2, tumor size 4.0 x 3.8 x 2.5 cm.  Clear margins.  Status post left mastectomy followed by [facility] followed by Herceptin for one year followed by letrozole completed in October 2015  Madeline [name] is a 65 y.o.  female with a history of stage III left breast cancer, diagnosed in May 2010, status post left mastectomy and treatment as delineated above, also long history of chronic lower back pain secondary to lumbar degenerative disc disease and stenosis who is treated by Dr. Primus Bravo with chronic narcotic medication. She has noted worsening lower back and thoracic pain over the past few months, which she attributed to active rehabilitation/physical therapy, however, which progressed to the point where she was no longer able to get off the commode or to ambulate, prompting her family to bring her into the emergency room yesterday. She was found to have multiple lytic lesions in the spine with a mass arising from T10, encroaching on foraminal area. She has improved overnight with pain management.  REVIEW OF SYSTEMS:   Review of Systems  All other systems  reviewed and are negative.    PAST MEDICAL HISTORY: Past Medical History  Diagnosis Date  . Depression   . Goiter, nodular     multi  . Dyslipidemia   . NIDDM, uncontrolled, with neuropathy   . Hypertension   . Allergy   . GERD (gastroesophageal reflux disease)   . Urinary incontinence   . Peripheral neuropathy ([place])   . Multiple sclerosis ([place])   . Breast cancer ([place])   . Hypothyroidism   . Asthma   . COPD (chronic obstructive pulmonary disease) ([place])   . DVT (deep venous thrombosis) (HCC) 1990's    right leg  . Gout   . Femur fracture ([place])   . Frequent UTI     PAST SURGICAL HISTORY: Past Surgical History  Procedure Laterality Date  . Thyroidectomy  09/2004  . Transthoracic echocardiogram  05/16/2004  . Partial hysterectomy  1975  . Axillary hidradenitis excision  1993    Excision biopsy growth right axilla, benign   . Other surgical history      Thyroid biopsy 10/99  . Carpal tunnel release  03/2008    bilateral  . Mastectomy, radical  02/2009    left modified  . Tonsillectomy    . Appendectomy    . Femur fracture surgery Left june 2016    FAMILY HISTORY Family History  Problem Relation Age of Onset  . Kidney failure  Mother   . Heart disease Father 79  . Heart disease Brother   . Heart disease Brother     ADVANCED DIRECTIVES:  No flowsheet data found.  HEALTH MAINTENANCE: Social History  Substance Use Topics  . Smoking status: Current Every Day Smoker -- 0.00 packs/day for 50 years    Types: Cigarettes    Last Attempt to Quit: 07/12/2015  . Smokeless tobacco: Never Used     Comment: almost stopped, Pt states smokes 1 cigarette a day.  . Alcohol Use: No     Allergies  Allergen Reactions  . Penicillins Swelling    tongue and throat swelling tolerates cephalosporins  . Sulfa Antibiotics Hives and Itching  . Trovan [Alatrofloxacin] Hives  . Rosiglitazone Maleate Swelling    Current Facility-Administered Medications  Medication Dose  Route Frequency Provider Last Rate Last Dose  . 0.9 %  sodium chloride infusion   Intravenous Continuous Theodoro Grist, MD 50 mL/hr at 12/21/15 1745    . albuterol (PROVENTIL) (2.5 MG/3ML) 0.083% nebulizer solution 2.5 mg  2.5 mg Nebulization Q6H PRN Theodoro Grist, MD      . allopurinol (ZYLOPRIM) tablet 300 mg  300 mg Oral Daily Theodoro Grist, MD   300 mg at 12/22/15 0856  . aspirin tablet 325 mg  325 mg Oral Daily Theodoro Grist, MD   325 mg at 12/22/15 0857  . bisacodyl (DULCOLAX) EC tablet 5 mg  5 mg Oral Daily PRN Theodoro Grist, MD      . budesonide-formoterol (SYMBICORT) 160-4.5 MCG/ACT inhaler 1 puff  1 puff Inhalation BID Theodoro Grist, MD   1 puff at 12/21/15 2203  . calcium-vitamin D (OSCAL WITH D) 500-200 MG-UNIT per tablet 1 tablet  1 tablet Oral BID Theodoro Grist, MD   1 tablet at 12/22/15 0855  . carvedilol (COREG) tablet 25 mg  25 mg Oral BID WC Theodoro Grist, MD   25 mg at 12/22/15 0856  . cholecalciferol (VITAMIN D) tablet 4,000 Units  4,000 Units Oral q morning - 10a Theodoro Grist, MD   4,000 Units at 12/22/15 0855  . diazepam (VALIUM) tablet 5 mg  5 mg Oral BID Demetrios Loll, MD   5 mg at 12/22/15 0856  . docusate sodium (COLACE) capsule 100 mg  100 mg Oral BID Theodoro Grist, MD   100 mg at 12/22/15 0855  . enoxaparin (LOVENOX) injection 40 mg  40 mg Subcutaneous Q12H Demetrios Loll, MD   40 mg at 12/21/15 2204  . FLUoxetine (PROZAC) capsule 20 mg  20 mg Oral Daily Theodoro Grist, MD   20 mg at 12/22/15 0856  . furosemide (LASIX) tablet 80 mg  80 mg Oral Daily Theodoro Grist, MD   80 mg at 12/22/15 0855  . gabapentin (NEURONTIN) capsule 600 mg  600 mg Oral QID Hillary Bow, MD   600 mg at 12/22/15 3009  . HYDROmorphone (DILAUDID) injection 1 mg  1 mg Intravenous Q2H PRN Theodoro Grist, MD   1 mg at 12/22/15 0859  . insulin aspart (novoLOG) injection 0-20 Units  0-20 Units Subcutaneous TID WC Theodoro Grist, MD   4 Units at 12/21/15 1746  . insulin aspart (novoLOG) injection 0-5 Units  0-5  Units Subcutaneous QHS Theodoro Grist, MD   0 Units at 12/20/15 2208  . insulin aspart (novoLOG) injection 4 Units  4 Units Subcutaneous TID WC Theodoro Grist, MD   4 Units at 12/21/15 0901  . insulin glargine (LANTUS) injection 47 Units  47 Units Subcutaneous BID Theodoro Grist,  MD   47 Units at 12/21/15 2204  . levothyroxine (SYNTHROID, LEVOTHROID) tablet 175 mcg  175 mcg Oral QAC breakfast Theodoro Grist, MD   175 mcg at 12/22/15 0856  . metFORMIN (GLUCOPHAGE) tablet 1,000 mg  1,000 mg Oral Q breakfast Theodoro Grist, MD   1,000 mg at 12/22/15 0855  . omega-3 acid ethyl esters (LOVAZA) capsule 2 g  2 g Oral Daily Theodoro Grist, MD   2 g at 12/22/15 0857  . ondansetron (ZOFRAN) tablet 4 mg  4 mg Oral Q6H PRN Theodoro Grist, MD       Or  . ondansetron (ZOFRAN) injection 4 mg  4 mg Intravenous Q6H PRN Theodoro Grist, MD      . oxyCODONE (Oxy IR/ROXICODONE) immediate release tablet 10 mg  10 mg Oral Q4H PRN Theodoro Grist, MD   10 mg at 12/21/15 2204  . pantoprazole (PROTONIX) EC tablet 40 mg  40 mg Oral Daily Theodoro Grist, MD   40 mg at 12/22/15 0857  . potassium chloride SA (K-DUR,KLOR-CON) CR tablet 20 mEq  20 mEq Oral Daily Demetrios Loll, MD   20 mEq at 12/22/15 0857  . senna (SENOKOT) tablet 8.6 mg  1 tablet Oral BID Theodoro Grist, MD   8.6 mg at 12/22/15 0857  . tiotropium (SPIRIVA) inhalation capsule 18 mcg  18 mcg Inhalation Daily Theodoro Grist, MD   18 mcg at 12/22/15 0859  . traZODone (DESYREL) tablet 200 mg  200 mg Oral QHS Theodoro Grist, MD   200 mg at 12/21/15 2203    OBJECTIVE:  Filed Vitals:   12/22/15 0534 12/22/15 0850  BP: 158/62 110/60  Pulse: 92 70  Temp: 98.9 F (37.2 C)   Resp: 18 19     Body mass index is 49.37 kg/(m^2).    ECOG FS:3 - Symptomatic, >50% confined to bed  Physical Exam  Constitutional: She is oriented to person, place, and time and well-developed, well-nourished, and in no distress. No distress.  Morbidly obese Caucasian female  HENT:  Head: Normocephalic and  atraumatic.  Right Ear: External ear normal.  Left Ear: External ear normal.  Mouth/Throat: Oropharynx is clear and moist.  Eyes: Conjunctivae are normal. Pupils are equal, round, and reactive to light. Right eye exhibits no discharge. Left eye exhibits no discharge. No scleral icterus.  Neck: Normal range of motion. Neck supple. No JVD present. No tracheal deviation present. No thyromegaly present.  Cardiovascular: Normal rate, regular rhythm, normal heart sounds and intact distal pulses.  Exam reveals no gallop and no friction rub.   No murmur heard. Pulmonary/Chest: Effort normal and breath sounds normal. No stridor. No respiratory distress. She has no wheezes. She has no rales. She exhibits no tenderness.  Status post left mastectomy  Abdominal: Soft. Bowel sounds are normal. She exhibits no distension and no mass. There is no tenderness. There is no rebound and no guarding.  Genitourinary:  Postponed  Musculoskeletal: Normal range of motion. She exhibits tenderness (Palpation of lower thoracic spine). She exhibits no edema.  Lymphadenopathy:    She has no cervical adenopathy.  Neurological: She is alert and oriented to person, place, and time. She has normal reflexes. No cranial nerve deficit. She exhibits normal muscle tone. Gait normal. Coordination normal. GCS score is 15.  Skin: Skin is warm. No rash noted. She is not diaphoretic. No erythema. No pallor.  Psychiatric: Mood, memory, affect and judgment normal.  Vitals reviewed.    LAB RESULTS:  CBC Latest Ref Rng 12/21/2015 12/20/2015  WBC 3.6 - 11.0 K/uL 7.6 7.7  Hemoglobin 12.0 - 16.0 g/dL 11.3(L) 11.8(L)  Hematocrit 35.0 - 47.0 % 34.8(L) 37.2  Platelets 150 - 440 K/uL 200 213    Admission on 12/20/2015  Component Date Value Ref Range Status  . Color, Urine 12/20/2015 YELLOW* YELLOW Final  . APPearance 12/20/2015 CLEAR* CLEAR Final  . Glucose, UA 12/20/2015 NEGATIVE  NEGATIVE mg/dL Final  . Bilirubin Urine 12/20/2015  NEGATIVE  NEGATIVE Final  . Ketones, ur 12/20/2015 NEGATIVE  NEGATIVE mg/dL Final  . Specific Gravity, Urine 12/20/2015 1.005  1.005 - 1.030 Final  . Hgb urine dipstick 12/20/2015 NEGATIVE  NEGATIVE Final  . pH 12/20/2015 5.0  5.0 - 8.0 Final  . Protein, ur 12/20/2015 NEGATIVE  NEGATIVE mg/dL Final  . Nitrite 12/20/2015 NEGATIVE  NEGATIVE Final  . Leukocytes, UA 12/20/2015 NEGATIVE  NEGATIVE Final  . RBC / HPF 12/20/2015 NONE SEEN  0 - 5 RBC/hpf Final  . WBC, UA 12/20/2015 NONE SEEN  0 - 5 WBC/hpf Final  . Bacteria, UA 12/20/2015 NONE SEEN  NONE SEEN Final  . Squamous Epithelial / LPF 12/20/2015 0-5* NONE SEEN Final  . Mucous 12/20/2015 PRESENT   Final  . Hyaline Casts, UA 12/20/2015 PRESENT   Final  . WBC 12/20/2015 7.7  3.6 - 11.0 K/uL Final  . RBC 12/20/2015 4.50  3.80 - 5.20 MIL/uL Final  . Hemoglobin 12/20/2015 11.8* 12.0 - 16.0 g/dL Final  . HCT 12/20/2015 37.2  35.0 - 47.0 % Final  . MCV 12/20/2015 82.8  80.0 - 100.0 fL Final  . MCH 12/20/2015 26.2  26.0 - 34.0 pg Final  . MCHC 12/20/2015 31.6* 32.0 - 36.0 g/dL Final  . RDW 12/20/2015 16.4* 11.5 - 14.5 % Final  . Platelets 12/20/2015 213  150 - 440 K/uL Final  . Neutrophils Relative % 12/20/2015 54   Final  . Neutro Abs 12/20/2015 4.2  1.4 - 6.5 K/uL Final  . Lymphocytes Relative 12/20/2015 25   Final  . Lymphs Abs 12/20/2015 1.9  1.0 - 3.6 K/uL Final  . Monocytes Relative 12/20/2015 14   Final  . Monocytes Absolute 12/20/2015 1.0* 0.2 - 0.9 K/uL Final  . Eosinophils Relative 12/20/2015 6   Final  . Eosinophils Absolute 12/20/2015 0.5  0 - 0.7 K/uL Final  . Basophils Relative 12/20/2015 1   Final  . Basophils Absolute 12/20/2015 0.1  0 - 0.1 K/uL Final  . Sodium 12/20/2015 144  135 - 145 mmol/L Final  . Potassium 12/20/2015 4.0  3.5 - 5.1 mmol/L Final  . Chloride 12/20/2015 101  101 - 111 mmol/L Final  . CO2 12/20/2015 34* 22 - 32 mmol/L Final  . Glucose, Bld 12/20/2015 74  65 - 99 mg/dL Final  . BUN 12/20/2015 17  6 - 20  mg/dL Final  . Creatinine, Ser 12/20/2015 0.80  0.44 - 1.00 mg/dL Final  . Calcium 12/20/2015 9.6  8.9 - 10.3 mg/dL Final  . Total Protein 12/20/2015 7.2  6.5 - 8.1 g/dL Final  . Albumin 12/20/2015 3.8  3.5 - 5.0 g/dL Final  . AST 12/20/2015 29  15 - 41 U/L Final  . ALT 12/20/2015 24  14 - 54 U/L Final  . Alkaline Phosphatase 12/20/2015 145* 38 - 126 U/L Final  . Total Bilirubin 12/20/2015 0.8  0.3 - 1.2 mg/dL Final  . GFR calc non Af Amer 12/20/2015 >60  >60 mL/min Final  . GFR calc Af Amer 12/20/2015 >60  >60 mL/min Final  Comment: (NOTE) The eGFR has been calculated using the CKD EPI equation. This calculation has not been validated in all clinical situations. eGFR's persistently <60 mL/min signify possible Chronic Kidney Disease.   . Anion gap 12/20/2015 9  5 - 15 Final  . Lipase 12/20/2015 25  11 - 51 U/L Final  . Fibrin derivatives D-dimer ([facility]) 12/20/2015 1803* 0 - 499 Final   Comment: <> Exclusion of Venous Thromboembolism (VTE) - OUTPATIENTS ONLY        (Emergency Department or [place])             0-499 ng/ml (FEU)  : With a low to intermediate pretest                                        probability for VTE this test result                                        excludes the diagnosis of VTE.           > 499 ng/ml (FEU)  : VTE not excluded.  Additional work up                                   for VTE is required.   <>  Testing on Inpatients and Evaluation of Disseminated Intravascular        Coagulation (DIC)             Reference Range:   0-499 ng/ml (FEU) Performed at [facility]   . Hgb A1c MFr Bld 12/20/2015 7.1* 4.0 - 6.0 % Final  . Sodium 12/21/2015 143  135 - 145 mmol/L Final  . Potassium 12/21/2015 4.0  3.5 - 5.1 mmol/L Final  . Chloride 12/21/2015 104  101 - 111 mmol/L Final  . CO2 12/21/2015 33* 22 - 32 mmol/L Final  . Glucose, Bld 12/21/2015 111* 65 - 99 mg/dL Final  . BUN 57/90/7931 15  6 - 20 mg/dL Final  . Creatinine, Ser  12/21/2015 0.69  0.44 - 1.00 mg/dL Final  . Calcium 07/24/5601 9.0  8.9 - 10.3 mg/dL Final  . GFR calc non Af Amer 12/21/2015 >60  >60 mL/min Final  . GFR calc Af Amer 12/21/2015 >60  >60 mL/min Final   Comment: (NOTE) The eGFR has been calculated using the CKD EPI equation. This calculation has not been validated in all clinical situations. eGFR's persistently <60 mL/min signify possible Chronic Kidney Disease.   . Anion gap 12/21/2015 6  5 - 15 Final  . WBC 12/21/2015 7.6  3.6 - 11.0 K/uL Final  . RBC 12/21/2015 4.25  3.80 - 5.20 MIL/uL Final  . Hemoglobin 12/21/2015 11.3* 12.0 - 16.0 g/dL Final  . HCT 78/29/6039 34.8* 35.0 - 47.0 % Final  . MCV 12/21/2015 81.7  80.0 - 100.0 fL Final  . MCH 12/21/2015 26.6  26.0 - 34.0 pg Final  . MCHC 12/21/2015 32.5  32.0 - 36.0 g/dL Final  . RDW 05/64/6980 16.5* 11.5 - 14.5 % Final  . Platelets 12/21/2015 200  150 - 440 K/uL Final  . Glucose-Capillary 12/20/2015 169* 65 - 99 mg/dL Final  . Glucose-Capillary 12/21/2015 106* 65 - 99 mg/dL Final  . Glucose-Capillary  12/21/2015 124* 65 - 99 mg/dL Final  . Glucose-Capillary 12/21/2015 191* 65 - 99 mg/dL Final  . Glucose-Capillary 12/21/2015 146* 65 - 99 mg/dL Final  . Comment 1 12/21/2015 Notify RN   Final  . Glucose-Capillary 12/22/2015 101* 65 - 99 mg/dL Final       STUDIES: Dg Lumbar Spine 2-3 Views  12/20/2015  CLINICAL DATA:  Low back pain for 3 days EXAM: LUMBAR SPINE - 2-3 VIEW COMPARISON:  None. FINDINGS: Extremely limited evaluation secondary to body habitus and technique. There is L1 vertebral body height loss concerning for a compression fracture of indeterminate age. Alignment is normal. Intervertebral disc spaces are maintained. IMPRESSION: Age-indeterminate L1 vertebral body compression fracture. Electronically Signed   By: Kathreen Devoid   On: 12/20/2015 12:41   Ct Angio Chest Pe W/cm &/or Wo Cm  12/20/2015  CLINICAL DATA:  History of breast cancer. Patient complains of chronic back  pain worsened over last 3 days becoming unbearable. EXAM: CT ANGIOGRAPHY CHEST WITH CONTRAST TECHNIQUE: Multidetector CT imaging of the chest was performed using the standard protocol during bolus administration of intravenous contrast. Multiplanar CT image reconstructions and MIPs were obtained to evaluate the vascular anatomy. CONTRAST:  16m OMNIPAQUE IOHEXOL 350 MG/ML SOLN COMPARISON:  December 16, 2014 FINDINGS: There is no pulmonary embolus. There is atherosclerosis of the thoracic aorta without dissection or aneurysm. The heart size is normal. There is no pericardial effusion. There is no mediastinal or hilar lymphadenopathy. Patient status post prior left breast surgery with postsurgical scar and clips. Images of the lungs demonstrate tiny focal peripheral scar of the bilateral lung apices unchanged compared to prior exam. There is no pulmonary mass or focal pneumonia. There is no pleural effusion. Images of the visualized upper abdominal structures demonstrate gallstones within the gallbladder. The other visualized upper abdominal structures are unremarkable. There is deformity of the anterior aspect of the left mid kidney not completely included. Images of the bones demonstrate mixed lucency and sclerosis identified throughout the spine consistent consistent with bone metastasis. This is worse at the T10 level with marked bony destruction with abnormal soft tissue extending into the spinal canal narrowing the spinal canal and obliterating the right neural foraman. Review of the MIP images confirms the above findings. IMPRESSION: No pulmonary embolus. No acute abnormality identified in the chest. 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 The the metastatic changes worst at the T10 level with marked bony destruction and abnormal soft tissue extending into the spinal canal, narrowing the spinal canal and obliterating the right neural foramen. Core compression is not excluded. Further  evaluation with MRI is recommended. These results will be called to the ordering clinician or representative by the Radiologist Assistant, and communication documented in the PACS or zVision Dashboard. Electronically Signed   By: WAbelardo DieselM.D.   On: 12/20/2015 20:19   Ct Renal Stone Study  12/20/2015  CLINICAL DATA:  Bilateral back pain for 3 days EXAM: CT ABDOMEN AND PELVIS WITHOUT CONTRAST TECHNIQUE: Multidetector CT imaging of the abdomen and pelvis was performed following the standard protocol without IV contrast. COMPARISON:  None. FINDINGS: Lung bases are free of acute infiltrate or sizable effusion. The liver, spleen, adrenal glands and pancreas are all normal in their CT appearance. The gallbladder is well distended with multiple dependent gallstones. No biliary obstructive changes are seen. Kidneys are well visualized bilaterally without evidence of renal calculi or urinary tract obstructive changes. The bladder is partially distended. Aortoiliac calcifications are noted without aneurysmal  dilatation. The appendix is within normal limits. The osseous structures show postsurgical changes in the proximal left femur. There also multiple lytic lesions consistent with metastatic disease. Significant destruction is noted in the T10 vertebral body on the right with some impingement upon the thecal sac. Lytic lesions are also noted anteriorly in the L1 vertebral body as well within the L3, L4 and L5 vertebral bodies. IMPRESSION: Cholelithiasis without complicating factors. Multiple lytic lesions within the thoracic and lumbar spine consistent with metastatic breast cancer. Compression deformity of uncertain chronicity is noted at T12. The T10 lesion encroaches upon the thecal sac. Further evaluation by means of MRI is recommended. Electronically Signed   By: Inez Catalina M.D.   On: 12/20/2015 16:33    ASSESSMENT and MEDICAL DECISION MAKING:  Suspicion for metastatic breast cancer-in a patient with stage  III ER positive PR positive HER-2 positive breast cancer diagnosed 6 years ago the appearance of multiple bony metastasis is very suspicious and concerning for metastatic breast cancer; absence of visceral lesions also goes along with the suspicion of metastatic ER/PR positive breast cancer. The patient should undergo a biopsy of the most accessible lesion. We will contact our radiation oncology colleagues to consider radiating T10 lesion. Once the diagnosis is confirmed pathologically, patient will need Xgeva along with hormonal treatment. This will be discussed on an outpatient basis. Since there is no clear evidence that CA-27-29 was elevated prior to surgery, we will not order this test again. Symptomatically, it seems that the patient suffers mostly from the lower thoracic pain, so with a likelihood T10 lesion should be addressed first.   Patient expressed understanding and was in agreement with this plan. She also understands that She can call clinic at any time with any questions, concerns, or complaints.    No matching staging information was found for the patient.  Roxana Hires, MD   12/22/2015 9:10 AM

## 2015-12-22 NOTE — Progress Notes (Addendum)
Subjective:     Lower back pain with probable metastatic disease to lumbar spine and possible acute or subacute L1 compression fracture. Patient reports pain as 2 on 0-10 scale, she recently received IV pain medication Plan is to go Skilled nursing facility after hospital stay. Negative for chest pain and shortness of breath Fever: no Gastrointestinal:Negative for nausea and vomiting  Objective: Vital signs in last 24 hours: Temp:  [97.7 F (36.5 C)-98.9 F (37.2 C)] 98.9 F (37.2 C) (02/11 0534) Pulse Rate:  [70-92] 70 (02/11 0850) Resp:  [16-20] 19 (02/11 0850) BP: (110-158)/(57-63) 110/60 mmHg (02/11 0850) SpO2:  [94 %-98 %] 94 % (02/11 0850)  Intake/Output from previous day:  Intake/Output Summary (Last 24 hours) at 12/22/15 1008 Last data filed at 12/22/15 0753  Gross per 24 hour  Intake    871 ml  Output   3200 ml  Net  -2329 ml    Intake/Output this shift: Total I/O In: -  Out: 650 [Urine:650]  Labs:  Recent Labs  12/20/15 1650 12/21/15 0502  HGB 11.8* 11.3*    Recent Labs  12/20/15 1650 12/21/15 0502  WBC 7.7 7.6  RBC 4.50 4.25  HCT 37.2 34.8*  PLT 213 200    Recent Labs  12/20/15 1650 12/21/15 0502  NA 144 143  K 4.0 4.0  CL 101 104  CO2 34* 33*  BUN 17 15  CREATININE 0.80 0.69  GLUCOSE 74 111*  CALCIUM 9.6 9.0   No results for input(s): LABPT, INR in the last 72 hours.   EXAM General - Patient is Alert, Appropriate and Oriented  The patient has reproduction of her lower back pain with attempted log rolling as well as shifting in bed. She has mild tenderness to percussion over the mid and lower lumbar spine. There is no skin abnormalities. She is neurovascularly intact to both lower extremities as sensation is decreased symmetrically but present to all distributions in both lower extremities. She has good capillary refill to both feet, and can actively dorsiflex and plantarflex her toes and invert and evert both ankles with good  strength.  Past Medical History  Diagnosis Date  . Depression   . Goiter, nodular     multi  . Dyslipidemia   . NIDDM, uncontrolled, with neuropathy   . Hypertension   . Allergy   . GERD (gastroesophageal reflux disease)   . Urinary incontinence   . Peripheral neuropathy ([place])   . Multiple sclerosis ([place])   . Breast cancer ([place])   . Hypothyroidism   . Asthma   . COPD (chronic obstructive pulmonary disease) ([place])   . DVT (deep venous thrombosis) (HCC) 1990's    right leg  . Gout   . Femur fracture ([place])   . Frequent UTI     Assessment/Plan:     Principal Problem:   Back pain Active Problems:   Metastatic carcinoma (HCC)   Dyspnea   Obesity  Estimated body mass index is 49.37 kg/(m^2) as calculated from the following:   Height as of this encounter: 5\' 2"  (1.575 m).   Weight as of this encounter: 122.471 kg (270 lb). Advance diet Up with therapy   Still awaiting Oncology consult to determine if MRI should be ordered for the lumbar spine. Continue pain control and physical therapy as symptoms permit.  DVT Prophylaxis - Lovenox  J. Cameron Proud, PA-C [facility] Orthopaedic Surgery 12/22/2015, 10:08 AM   I agree with oncologist's plan to pursue treatment of the T10 lesion  first with biopsy followed by radiation therapy. I have spoken with Dr. Rudene Christians who would be happy to perform the bone biopsy at T10 early next week. He will review the patient's images over the weekend and discuss this with her further on Monday morning. Thank you!  Pascal Lux, MD  Discussed with patient, hope to do biopsy/kyphoplasty tomorrow, depending on OR scheduling

## 2015-12-22 NOTE — Progress Notes (Signed)
[facility] [place] NAME: Madeline [name]    MR#:  [id number]  DATE OF BIRTH:  1951/07/28  SUBJECTIVE:  CHIEF COMPLAINT:   Chief Complaint  Patient presents with  . Back Pain   back pain is better with the pain medication.  REVIEW OF SYSTEMS:  CONSTITUTIONAL: No fever, generalized weakness.  EYES: No blurred or double vision.  EARS, NOSE, AND THROAT: No tinnitus or ear pain.  RESPIRATORY: No cough, shortness of breath, wheezing or hemoptysis.  CARDIOVASCULAR: No chest pain, orthopnea, edema.  GASTROINTESTINAL: No nausea, vomiting, diarrhea or abdominal pain.  GENITOURINARY: No dysuria, hematuria.  ENDOCRINE: No polyuria, nocturia,  HEMATOLOGY: No anemia, easy bruising or bleeding SKIN: No rash or lesion. MUSCULOSKELETAL: Back pain.   NEUROLOGIC: No tingling, numbness, weakness.  PSYCHIATRY: No anxiety or depression.   DRUG ALLERGIES:   Allergies  Allergen Reactions  . Penicillins Swelling    tongue and throat swelling tolerates cephalosporins  . Sulfa Antibiotics Hives and Itching  . Trovan [Alatrofloxacin] Hives  . Rosiglitazone Maleate Swelling    VITALS:  Blood pressure 110/60, pulse 70, temperature 98.9 F (37.2 C), temperature source Oral, resp. rate 19, height 5\' 2"  (1.575 m), weight 122.471 kg (270 lb), SpO2 94 %.  PHYSICAL EXAMINATION:  GENERAL:  65 y.o.-year-old patient lying in the bed with no acute distress. Morbid obesity. EYES: Pupils equal, round, reactive to light and accommodation. No scleral icterus. Extraocular muscles intact.  HEENT: Head atraumatic, normocephalic. Oropharynx and nasopharynx clear.  NECK:  Supple, no jugular venous distention. No thyroid enlargement, no tenderness.  LUNGS: Normal breath sounds bilaterally, no wheezing, rales,rhonchi or crepitation. No use of accessory muscles of respiration.  CARDIOVASCULAR: S1, S2 normal. No murmurs, rubs, or gallops.  ABDOMEN: Soft, nontender,  nondistended. Bowel sounds present. No organomegaly or mass.  EXTREMITIES: No pedal edema, cyanosis, or clubbing.  NEUROLOGIC: Cranial nerves II through XII are intact. Muscle strength 4/5 in lower extremities. Sensation intact. Gait not checked.  PSYCHIATRIC: The patient is alert and oriented x 3.  SKIN: No obvious rash, lesion, or ulcer.    LABORATORY PANEL:   CBC  Recent Labs Lab 12/21/15 0502  WBC 7.6  HGB 11.3*  HCT 34.8*  PLT 200   ------------------------------------------------------------------------------------------------------------------  Chemistries   Recent Labs Lab 12/20/15 1650 12/21/15 0502  NA 144 143  K 4.0 4.0  CL 101 104  CO2 34* 33*  GLUCOSE 74 111*  BUN 17 15  CREATININE 0.80 0.69  CALCIUM 9.6 9.0  AST 29  --   ALT 24  --   ALKPHOS 145*  --   BILITOT 0.8  --    ------------------------------------------------------------------------------------------------------------------  Cardiac Enzymes No results for input(s): TROPONINI in the last 168 hours. ------------------------------------------------------------------------------------------------------------------  RADIOLOGY:  Ct Angio Chest Pe W/cm &/or Wo Cm  12/20/2015  CLINICAL DATA:  History of breast cancer. Patient complains of chronic back pain worsened over last 3 days becoming unbearable. EXAM: CT ANGIOGRAPHY CHEST WITH CONTRAST TECHNIQUE: Multidetector CT imaging of the chest was performed using the standard protocol during bolus administration of intravenous contrast. Multiplanar CT image reconstructions and MIPs were obtained to evaluate the vascular anatomy. CONTRAST:  143mL OMNIPAQUE IOHEXOL 350 MG/ML SOLN COMPARISON:  December 16, 2014 FINDINGS: There is no pulmonary embolus. There is atherosclerosis of the thoracic aorta without dissection or aneurysm. The heart size is normal. There is no pericardial effusion. There is no mediastinal or hilar lymphadenopathy. Patient status post  prior  left breast surgery with postsurgical scar and clips. Images of the lungs demonstrate tiny focal peripheral scar of the bilateral lung apices unchanged compared to prior exam. There is no pulmonary mass or focal pneumonia. There is no pleural effusion. Images of the visualized upper abdominal structures demonstrate gallstones within the gallbladder. The other visualized upper abdominal structures are unremarkable. There is deformity of the anterior aspect of the left mid kidney not completely included. Images of the bones demonstrate mixed lucency and sclerosis identified throughout the spine consistent consistent with bone metastasis. This is worse at the T10 level with marked bony destruction with abnormal soft tissue extending into the spinal canal narrowing the spinal canal and obliterating the right neural foraman. Review of the MIP images confirms the above findings. IMPRESSION: No pulmonary embolus. No acute abnormality identified in the chest. 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 The the metastatic changes worst at the T10 level with marked bony destruction and abnormal soft tissue extending into the spinal canal, narrowing the spinal canal and obliterating the right neural foramen. Core compression is not excluded. Further evaluation with MRI is recommended. These results will be called to the ordering clinician or representative by the Radiologist Assistant, and communication documented in the PACS or zVision Dashboard. Electronically Signed   By: Abelardo Diesel M.D.   On: 12/20/2015 20:19   Ct Renal Stone Study  12/20/2015  CLINICAL DATA:  Bilateral back pain for 3 days EXAM: CT ABDOMEN AND PELVIS WITHOUT CONTRAST TECHNIQUE: Multidetector CT imaging of the abdomen and pelvis was performed following the standard protocol without IV contrast. COMPARISON:  None. FINDINGS: Lung bases are free of acute infiltrate or sizable effusion. The liver, spleen, adrenal glands and pancreas  are all normal in their CT appearance. The gallbladder is well distended with multiple dependent gallstones. No biliary obstructive changes are seen. Kidneys are well visualized bilaterally without evidence of renal calculi or urinary tract obstructive changes. The bladder is partially distended. Aortoiliac calcifications are noted without aneurysmal dilatation. The appendix is within normal limits. The osseous structures show postsurgical changes in the proximal left femur. There also multiple lytic lesions consistent with metastatic disease. Significant destruction is noted in the T10 vertebral body on the right with some impingement upon the thecal sac. Lytic lesions are also noted anteriorly in the L1 vertebral body as well within the L3, L4 and L5 vertebral bodies. IMPRESSION: Cholelithiasis without complicating factors. Multiple lytic lesions within the thoracic and lumbar spine consistent with metastatic breast cancer. Compression deformity of uncertain chronicity is noted at T12. The T10 lesion encroaches upon the thecal sac. Further evaluation by means of MRI is recommended. Electronically Signed   By: Inez Catalina M.D.   On: 12/20/2015 16:33    EKG:   Orders placed or performed during the hospital encounter of 05/05/15  . ED EKG  . ED EKG  . EKG    ASSESSMENT AND PLAN:   #1. Acute T12 compression fracture with resultant lower back pain, Continue high doses of oxycodone, Dilaudid prn.  #2. 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  Per Dr. Wynell Balloon, The patient should undergo a biopsy of the most accessible lesion. He will contact our radiation oncology colleagues to consider radiating T10 lesion.  #3 anemia of unclear etiology at this time, likely anemia of chronic disease,  Stable. #4 hypertension, continue outpatient medications. #5. Hyperlipidemia,  continue outpatient medications, #6. COPD, stable, ruled out pulmonary embolism per CTA.   * DM2. Continue Lantus and sliding scale. Hold metformin. * Tobacco abuse. Smoking cessation was counseled for 3 minutes.  I discussed with Dr. Wynell Balloon, oncologist. All the records are reviewed and case discussed with Care Management/Social Workerr. Management plans discussed with the patient, her daughter and they are in agreement.  CODE STATUS: Full code  TOTAL TIME TAKING CARE OF THIS PATIENT: 37 minutes.  Greater than 50% time was spent on coordination of care and face-to-face counseling.  POSSIBLE D/C IN 2 DAYS, DEPENDING ON CLINICAL CONDITION.   Demetrios Loll M.D on 12/22/2015 at 3:22 PM  Between 7am to 6pm - Pager - [phone]  After 6pm go to [email or web address] - password [email or web address] [facility]  Office  [phone]  CC: Primary care physician; Viviana Simpler, MD

## 2015-12-23 LAB — GLUCOSE, CAPILLARY
GLUCOSE-CAPILLARY: 150 mg/dL — AB (ref 65–99)
Glucose-Capillary: 129 mg/dL — ABNORMAL HIGH (ref 65–99)
Glucose-Capillary: 154 mg/dL — ABNORMAL HIGH (ref 65–99)
Glucose-Capillary: 220 mg/dL — ABNORMAL HIGH (ref 65–99)
Glucose-Capillary: 150 mg/dL — ABNORMAL HIGH (ref 65–99)

## 2015-12-23 MED ORDER — IBUPROFEN 400 MG PO TABS: 400.0000 mg | ORAL_TABLET | Freq: Four times a day (QID) | ORAL | Status: DC | PRN

## 2015-12-23 MED ORDER — LACTULOSE 10 GM/15ML PO SOLN
20.0000 g | Freq: Two times a day (BID) | ORAL | Status: DC | PRN
  Administered 2015-12-23 – 2015-12-26 (×2): 20 g via ORAL
  Filled 2015-12-23 (×2): qty 30

## 2015-12-23 NOTE — Progress Notes (Signed)
[facility] [place] NAME: Madeline [name]    MR#:  [id number]  DATE OF BIRTH:  1951/04/13  SUBJECTIVE:  CHIEF COMPLAINT:   Chief Complaint  Patient presents with  . Back Pain   back pain is better with the pain medications. Constipation.  REVIEW OF SYSTEMS:  CONSTITUTIONAL: No fever, generalized weakness.  EYES: No blurred or double vision.  EARS, NOSE, AND THROAT: No tinnitus or ear pain.  RESPIRATORY: No cough, shortness of breath, wheezing or hemoptysis.  CARDIOVASCULAR: No chest pain, orthopnea, edema.  GASTROINTESTINAL: No nausea, vomiting, diarrhea or abdominal pain. Has Constipation. GENITOURINARY: No dysuria, hematuria.  ENDOCRINE: No polyuria, nocturia,  HEMATOLOGY: No anemia, easy bruising or bleeding SKIN: No rash or lesion. MUSCULOSKELETAL: Back pain.   NEUROLOGIC: No tingling, numbness, weakness.  PSYCHIATRY: No anxiety or depression.   DRUG ALLERGIES:   Allergies  Allergen Reactions  . Penicillins Swelling    tongue and throat swelling tolerates cephalosporins  . Sulfa Antibiotics Hives and Itching  . Trovan [Alatrofloxacin] Hives  . Rosiglitazone Maleate Swelling    VITALS:  Blood pressure 115/63, pulse 78, temperature 97.6 F (36.4 C), temperature source Oral, resp. rate 20, height 5\' 2"  (1.575 m), weight 122.471 kg (270 lb), SpO2 92 %.  PHYSICAL EXAMINATION:  GENERAL:  65 y.o.-year-old patient lying in the bed with no acute distress. Morbid obesity. EYES: Pupils equal, round, reactive to light and accommodation. No scleral icterus. Extraocular muscles intact.  HEENT: Head atraumatic, normocephalic. Oropharynx and nasopharynx clear.  NECK:  Supple, no jugular venous distention. No thyroid enlargement, no tenderness.  LUNGS: Normal breath sounds bilaterally, no wheezing, rales,rhonchi or crepitation. No use of accessory muscles of respiration.  CARDIOVASCULAR: S1, S2 normal. No murmurs, rubs, or  gallops.  ABDOMEN: Soft, nontender, nondistended. Bowel sounds present. No organomegaly or mass.  EXTREMITIES: No pedal edema, cyanosis, or clubbing.  NEUROLOGIC: Cranial nerves II through XII are intact. Muscle strength 4/5 in lower extremities. Sensation intact. Gait not checked.  PSYCHIATRIC: The patient is alert and oriented x 3.  SKIN: No obvious rash, lesion, or ulcer.    LABORATORY PANEL:   CBC  Recent Labs Lab 12/21/15 0502  WBC 7.6  HGB 11.3*  HCT 34.8*  PLT 200   ------------------------------------------------------------------------------------------------------------------  Chemistries   Recent Labs Lab 12/20/15 1650 12/21/15 0502  NA 144 143  K 4.0 4.0  CL 101 104  CO2 34* 33*  GLUCOSE 74 111*  BUN 17 15  CREATININE 0.80 0.69  CALCIUM 9.6 9.0  AST 29  --   ALT 24  --   ALKPHOS 145*  --   BILITOT 0.8  --    ------------------------------------------------------------------------------------------------------------------  Cardiac Enzymes No results for input(s): TROPONINI in the last 168 hours. ------------------------------------------------------------------------------------------------------------------  RADIOLOGY:  No results found.  EKG:   Orders placed or performed during the hospital encounter of 05/05/15  . ED EKG  . ED EKG  . EKG    ASSESSMENT AND PLAN:   #1. Acute T12 compression fracture with resultant lower back pain, Continue high doses of oxycodone, Dilaudid prn. biopsy/kyphoplasty tomorrow per Dr Rudene Christians.  #2. 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  Per Dr. Wynell Balloon, The patient should undergo a biopsy of the most accessible lesion. He will contact our radiation oncology colleagues to consider radiating T10 lesion.  #3 anemia of unclear etiology  at this time, likely anemia of chronic disease,   Stable. #4 hypertension, continue outpatient medications. #5. Hyperlipidemia, continue outpatient medications, #6. COPD, stable, ruled out pulmonary embolism per CTA.   * DM2. Continue Lantus and sliding scale. Hold metformin. * Tobacco abuse. Smoking cessation was counseled for 3 minutes. * Constipation. Lactulose when necessary. Continue Colace.  I discussed with Dr. Wynell Balloon, oncologist. All the records are reviewed and case discussed with Care Management/Social Workerr. Management plans discussed with the patient, her daughter and they are in agreement.  CODE STATUS: Full code  TOTAL TIME TAKING CARE OF THIS PATIENT: 36 minutes.  Greater than 50% time was spent on coordination of care and face-to-face counseling.  POSSIBLE D/C IN 2 DAYS, DEPENDING ON CLINICAL CONDITION.   Demetrios Loll M.D on 12/23/2015 at 12:44 PM  Between 7am to 6pm - Pager - [phone]  After 6pm go to [email or web address] - password [email or web address] [facility]  Office  [phone]  CC: Primary care physician; Viviana Simpler, MD

## 2015-12-24 ENCOUNTER — Encounter: Payer: Self-pay | Admitting: *Deleted

## 2015-12-24 ENCOUNTER — Encounter: Payer: Self-pay | Admitting: Radiation Oncology

## 2015-12-24 ENCOUNTER — Inpatient Hospital Stay: Payer: Medicare Other

## 2015-12-24 ENCOUNTER — Inpatient Hospital Stay: Payer: Medicare Other | Admitting: Anesthesiology

## 2015-12-24 ENCOUNTER — Other Ambulatory Visit: Payer: Self-pay | Admitting: *Deleted

## 2015-12-24 ENCOUNTER — Encounter
Admission: EM | Disposition: A | Discharge status: Home Health | Payer: Self-pay | Source: Home / Self Care | Attending: Internal Medicine

## 2015-12-24 ENCOUNTER — Ambulatory Visit: Payer: Medicare Other | Admitting: Radiation Oncology

## 2015-12-24 DIAGNOSIS — E039 Hypothyroidism, unspecified: Secondary | ICD-10-CM

## 2015-12-24 DIAGNOSIS — Z79899 Other long term (current) drug therapy: Secondary | ICD-10-CM

## 2015-12-24 DIAGNOSIS — E785 Hyperlipidemia, unspecified: Secondary | ICD-10-CM

## 2015-12-24 DIAGNOSIS — C50919 Malignant neoplasm of unspecified site of unspecified female breast: Secondary | ICD-10-CM

## 2015-12-24 DIAGNOSIS — M4854XA Collapsed vertebra, not elsewhere classified, thoracic region, initial encounter for fracture: Secondary | ICD-10-CM | POA: Diagnosis not present

## 2015-12-24 DIAGNOSIS — Z7982 Long term (current) use of aspirin: Secondary | ICD-10-CM

## 2015-12-24 HISTORY — PX: KYPHOPLASTY: SHX5884

## 2015-12-24 LAB — GLUCOSE, CAPILLARY
GLUCOSE-CAPILLARY: 190 mg/dL — AB (ref 65–99)
GLUCOSE-CAPILLARY: 116 mg/dL — AB (ref 65–99)
GLUCOSE-CAPILLARY: 91 mg/dL (ref 65–99)
GLUCOSE-CAPILLARY: 125 mg/dL — AB (ref 65–99)
GLUCOSE-CAPILLARY: 114 mg/dL — AB (ref 65–99)
Glucose-Capillary: 255 mg/dL — ABNORMAL HIGH (ref 65–99)

## 2015-12-24 SURGERY — KYPHOPLASTY
Anesthesia: Monitor Anesthesia Care | Wound class: Clean

## 2015-12-24 MED ORDER — SODIUM CHLORIDE 0.9 % IV SOLN
INTRAVENOUS | Status: DC
  Administered 2015-12-24 – 2015-12-26 (×4): via INTRAVENOUS

## 2015-12-24 MED ORDER — KETAMINE HCL 10 MG/ML IJ SOLN
INTRAMUSCULAR | Status: DC | PRN
  Administered 2015-12-24: 25 mg via INTRAVENOUS
  Administered 2015-12-24: 15 mg via INTRAVENOUS
  Administered 2015-12-24: 10 mg via INTRAVENOUS

## 2015-12-24 MED ORDER — FENTANYL CITRATE (PF) 100 MCG/2ML IJ SOLN
INTRAMUSCULAR | Status: AC
  Administered 2015-12-24: 25 ug via INTRAVENOUS
  Filled 2015-12-24: qty 2

## 2015-12-24 MED ORDER — IOHEXOL 180 MG/ML  SOLN
INTRAMUSCULAR | Status: AC
  Filled 2015-12-24: qty 40

## 2015-12-24 MED ORDER — LIDOCAINE HCL 1 % IJ SOLN
INTRAMUSCULAR | Status: DC | PRN
  Administered 2015-12-24: 30 mL

## 2015-12-24 MED ORDER — MIDAZOLAM HCL 2 MG/2ML IJ SOLN
INTRAMUSCULAR | Status: DC | PRN
  Administered 2015-12-24: 2 mg via INTRAVENOUS

## 2015-12-24 MED ORDER — CLINDAMYCIN PHOSPHATE 900 MG/50ML IV SOLN
900.0000 mg | Freq: Once | INTRAVENOUS | Status: DC
  Filled 2015-12-24: qty 50

## 2015-12-24 MED ORDER — FENTANYL CITRATE (PF) 100 MCG/2ML IJ SOLN
INTRAMUSCULAR | Status: DC | PRN
  Administered 2015-12-24: 50 ug via INTRAVENOUS

## 2015-12-24 MED ORDER — FENTANYL CITRATE (PF) 100 MCG/2ML IJ SOLN
25.0000 ug | INTRAMUSCULAR | Status: DC | PRN
  Administered 2015-12-24 (×4): 25 ug via INTRAVENOUS

## 2015-12-24 MED ORDER — BUPIVACAINE-EPINEPHRINE (PF) 0.5% -1:200000 IJ SOLN
INTRAMUSCULAR | Status: DC | PRN
  Administered 2015-12-24: 20 mL

## 2015-12-24 MED ORDER — IOHEXOL 180 MG/ML  SOLN
INTRAMUSCULAR | Status: DC | PRN
  Administered 2015-12-24: 20 mL via INTRAVENOUS

## 2015-12-24 MED ORDER — LIDOCAINE HCL (PF) 1 % IJ SOLN
INTRAMUSCULAR | Status: AC
  Filled 2015-12-24: qty 60

## 2015-12-24 MED ORDER — BUPIVACAINE-EPINEPHRINE (PF) 0.5% -1:200000 IJ SOLN
INTRAMUSCULAR | Status: AC
  Filled 2015-12-24: qty 30

## 2015-12-24 MED ORDER — ONDANSETRON HCL 4 MG/2ML IJ SOLN: 4.0000 mg | Freq: Once | INTRAMUSCULAR | Status: DC | PRN

## 2015-12-24 SURGICAL SUPPLY — 13 items
CEMENT KYPHON CX01A KIT/MIXER (Cement) ×3 IMPLANT
DEVICE BIOPSY BONE KYPHX (INSTRUMENTS) ×3 IMPLANT
DRAPE C-ARM XRAY 36X54 (DRAPES) ×3 IMPLANT
DURAPREP 26ML APPLICATOR (WOUND CARE) ×3 IMPLANT
GLOVE SURG ORTHO 9.0 STRL STRW (GLOVE) ×3 IMPLANT
GOWN SPECIALTY ULTRA XL (MISCELLANEOUS) ×3 IMPLANT
GOWN STRL REUS W/ TWL LRG LVL3 (GOWN DISPOSABLE) ×1 IMPLANT
GOWN STRL REUS W/TWL LRG LVL3 (GOWN DISPOSABLE) ×3
LIQUID BAND (GAUZE/BANDAGES/DRESSINGS) ×3 IMPLANT
PACK KYPHOPLASTY (MISCELLANEOUS) ×3 IMPLANT
STRAP SAFETY BODY (MISCELLANEOUS) ×3 IMPLANT
TRAY KYPHOPAK 15/3 EXPRESS 1ST (MISCELLANEOUS) ×3 IMPLANT
TRAY KYPHOPAK 20/3 EXPRESS 1ST (MISCELLANEOUS) ×1 IMPLANT

## 2015-12-24 NOTE — Progress Notes (Signed)
Discussed procedure with patient, recommend biopsy and kyphoplasty at T10 and  L 1 as well with compression deformity, pain and lesion.

## 2015-12-24 NOTE — Care Management (Signed)
Ms. Amrhein would like to know if she qualifies for Medicaid. States she needs help with her hospital bills, Telephone call to Aleene Davidson, Patient Financial Specialist, to discuss medicaid qualifications. (Left voice mail). Shelbie Ammons RN MSN CCM  Care Management [phone]

## 2015-12-24 NOTE — Anesthesia Procedure Notes (Signed)
Procedure Name: MAC Date/Time: 12/24/2015 3:55 PM Performed by: Nelda Marseille Oxygen Delivery Method: Nasal cannula

## 2015-12-24 NOTE — Anesthesia Preprocedure Evaluation (Addendum)
Anesthesia Evaluation  Patient identified by MRN, date of birth, ID band Patient awake    Reviewed: Allergy & Precautions, H&P , NPO status , Patient's Chart, lab work & pertinent test results, reviewed documented beta blocker date and time   Airway Mallampati: II  TM Distance: >3 FB Neck ROM: full    Dental no notable dental hx. (+) Poor Dentition, Missing   Pulmonary neg pulmonary ROS, shortness of breath, asthma , COPD, Current Smoker,    Pulmonary exam normal breath sounds clear to auscultation       Cardiovascular Exercise Tolerance: Good hypertension, negative cardio ROS   Rhythm:regular Rate:Normal     Neuro/Psych PSYCHIATRIC DISORDERS  Neuromuscular disease negative neurological ROS  negative psych ROS   GI/Hepatic negative GI ROS, Neg liver ROS, GERD  ,  Endo/Other  negative endocrine ROSdiabetesHypothyroidism   Renal/GU      Musculoskeletal   Abdominal   Peds  Hematology negative hematology ROS (+)   Anesthesia Other Findings   Reproductive/Obstetrics negative OB ROS                            Anesthesia Physical Anesthesia Plan  ASA: III  Anesthesia Plan: MAC   Post-op Pain Management:    Induction:   Airway Management Planned:   Additional Equipment:   Intra-op Plan:   Post-operative Plan:   Informed Consent: I have reviewed the patients History and Physical, chart, labs and discussed the procedure including the risks, benefits and alternatives for the proposed anesthesia with the patient or authorized representative who has indicated his/her understanding and acceptance.     Plan Discussed with: CRNA  Anesthesia Plan Comments:         Anesthesia Quick Evaluation

## 2015-12-24 NOTE — Consult Note (Signed)
Except an outstanding is perfect of Radiation Oncology NEW PATIENT EVALUATION  Name: Madeline [name]  MRN: [id number]  Date:   12/20/2015     DOB: [birth year]   This 65 y.o. 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  REFERRING PHYSICIAN: No ref. provider found  CHIEF COMPLAINT:  Chief Complaint  Patient presents with  . Back Pain    DIAGNOSIS: The primary encounter diagnosis was Metastatic cancer ([place]). Diagnoses of Back pain, Compression fracture, Swelling, and Dyspnea were also pertinent to this visit.   PREVIOUS INVESTIGATIONS:  CT scans reviewed, bone scan ordered Clinical notes reviewed  HPI: Patient is a 65 year old female well known to our department having been treated back in 2010 status post left modified radical mastectomy for locally advanced breast cancer who received chest wall peripheral lymphatic radiation as well as systemic chemotherapy. She initially had 8 of 18 lymph nodes positive with a 4 cm tumor. She was stage IIIa. She had [place] followed by Herceptin and completed letrozole in October 2015. She's recently presented with increasing low back pain which has progressed to difficulty ambulating. She was found on CT scan to have multiple lytic lesions consistent with metastatic disease with changes at T10 with possible cord compression. She is scheduled today for  biopsy today of the T10 lesion and I been asked to evaluate her for possible palliative radiation therapy. She seen today in her hospital room. Her pain is under control with narcotic analgesics. She states she has good motor function in her lower extremities with no sensory loss. He also planning on doing kyphoplasty today.  PLANNED TREATMENT REGIMEN: Palliative radiation therapy to her thoracic lumbar spine  PAST MEDICAL HISTORY:  has a past medical history of Depression; Goiter, nodular; Dyslipidemia; NIDDM, uncontrolled, with  neuropathy; Hypertension; Allergy; GERD (gastroesophageal reflux disease); Urinary incontinence; Peripheral neuropathy ([place]); Multiple sclerosis ([place]); Breast cancer ([place]); Hypothyroidism; Asthma; COPD (chronic obstructive pulmonary disease) ([place]); DVT (deep venous thrombosis) ([place]) (1990's); Gout; Femur fracture ([place]); and Frequent UTI.    PAST SURGICAL HISTORY:  Past Surgical History  Procedure Laterality Date  . Thyroidectomy  09/2004  . Transthoracic echocardiogram  05/16/2004  . Partial hysterectomy  1975  . Axillary hidradenitis excision  1993    Excision biopsy growth right axilla, benign   . Other surgical history      Thyroid biopsy 10/99  . Carpal tunnel release  03/2008    bilateral  . Mastectomy, radical  02/2009    left modified  . Tonsillectomy    . Appendectomy    . Femur fracture surgery Left june 2016    FAMILY HISTORY: family history includes Heart disease in her brother and brother; Heart disease (age of onset: 89) in her father; Kidney failure in her mother.  SOCIAL HISTORY:  reports that she has been smoking Cigarettes.  She has been smoking about 0.00 packs per day for the past 50 years. She has never used smokeless tobacco. She reports that she does not drink alcohol or use illicit drugs.  ALLERGIES: Penicillins; Sulfa antibiotics; Trovan; and Rosiglitazone maleate  MEDICATIONS:  Current Facility-Administered Medications  Medication Dose Route Frequency Provider Last Rate Last Dose  . albuterol (PROVENTIL) (2.5 MG/3ML) 0.083% nebulizer solution 2.5 mg  2.5 mg Nebulization Q6H PRN Theodoro Grist, MD      . allopurinol (ZYLOPRIM) tablet 300 mg  300 mg Oral Daily Theodoro Grist, MD   300 mg at 12/24/15  [id number]  . aspirin tablet 325 mg  325 mg Oral Daily Theodoro Grist, MD   325 mg at 12/23/15 0843  . bisacodyl (DULCOLAX) EC tablet 5 mg  5 mg Oral Daily PRN Theodoro Grist, MD      . budesonide-formoterol (SYMBICORT) 160-4.5 MCG/ACT inhaler 1 puff  1 puff Inhalation BID  Theodoro Grist, MD   1 puff at 12/24/15 0949  . carvedilol (COREG) tablet 25 mg  25 mg Oral BID WC Theodoro Grist, MD   25 mg at 12/24/15 [id number]  . cholecalciferol (VITAMIN D) tablet 4,000 Units  4,000 Units Oral q morning - 10a Theodoro Grist, MD   4,000 Units at 12/24/15 0949  . clindamycin (CLEOCIN) IVPB 900 mg  900 mg Intravenous Once Hessie Knows, MD      . diazepam (VALIUM) tablet 5 mg  5 mg Oral BID Demetrios Loll, MD   5 mg at 12/24/15 0949  . docusate sodium (COLACE) capsule 100 mg  100 mg Oral BID Theodoro Grist, MD   100 mg at 12/24/15 0949  . FLUoxetine (PROZAC) capsule 20 mg  20 mg Oral Daily Theodoro Grist, MD   20 mg at 12/24/15 0949  . furosemide (LASIX) tablet 80 mg  80 mg Oral Daily Theodoro Grist, MD   80 mg at 12/24/15 0949  . gabapentin (NEURONTIN) capsule 600 mg  600 mg Oral QID Hillary Bow, MD   600 mg at 12/24/15 [id number]  . HYDROmorphone (DILAUDID) injection 1 mg  1 mg Intravenous Q2H PRN Theodoro Grist, MD   1 mg at 12/24/15 [id number]  . insulin aspart (novoLOG) injection 0-20 Units  0-20 Units Subcutaneous TID WC Theodoro Grist, MD   4 Units at 12/23/15 1701  . insulin aspart (novoLOG) injection 0-5 Units  0-5 Units Subcutaneous QHS Theodoro Grist, MD   0 Units at 12/20/15 2208  . insulin glargine (LANTUS) injection 47 Units  47 Units Subcutaneous BID Theodoro Grist, MD   47 Units at 12/23/15 2111  . lactulose (CHRONULAC) 10 GM/15ML solution 20 g  20 g Oral BID PRN Demetrios Loll, MD   20 g at 12/23/15 1701  . levothyroxine (SYNTHROID, LEVOTHROID) tablet 175 mcg  175 mcg Oral QAC breakfast Theodoro Grist, MD   175 mcg at 12/24/15 [id number]  . omega-3 acid ethyl esters (LOVAZA) capsule 2 g  2 g Oral Daily Theodoro Grist, MD   2 g at 12/24/15 0949  . ondansetron (ZOFRAN) tablet 4 mg  4 mg Oral Q6H PRN Theodoro Grist, MD       Or  . ondansetron (ZOFRAN) injection 4 mg  4 mg Intravenous Q6H PRN Theodoro Grist, MD      . oxyCODONE (Oxy IR/ROXICODONE) immediate release tablet 10 mg  10 mg Oral Q4H PRN Theodoro Grist, MD   10 mg at 12/23/15 0844  . pantoprazole (PROTONIX) EC tablet 40 mg  40 mg Oral Daily Theodoro Grist, MD   40 mg at 12/24/15 0950  . potassium chloride SA (K-DUR,KLOR-CON) CR tablet 20 mEq  20 mEq Oral Daily Demetrios Loll, MD   20 mEq at 12/24/15 0950  . senna (SENOKOT) tablet 8.6 mg  1 tablet Oral BID Theodoro Grist, MD   8.6 mg at 12/24/15 0949  . tiotropium (SPIRIVA) inhalation capsule 18 mcg  18 mcg Inhalation Daily Theodoro Grist, MD   18 mcg at 12/24/15 0842  . traZODone (DESYREL) tablet 200 mg  200 mg Oral QHS Theodoro Grist, MD   200 mg at 12/23/15 2111  ECOG PERFORMANCE STATUS:  3 - Symptomatic, >50% confined to bed  REVIEW OF SYSTEMS:  Patient denies any weight loss, fatigue, weakness, fever, chills or night sweats. Patient denies any loss of vision, blurred vision. Patient denies any ringing  of the ears or hearing loss. No irregular heartbeat. Patient denies heart murmur or history of fainting. Patient denies any chest pain or pain radiating to her upper extremities. Patient denies any shortness of breath, difficulty breathing at night, cough or hemoptysis. Patient denies any swelling in the lower legs. Patient denies any nausea vomiting, vomiting of blood, or coffee ground material in the vomitus. Patient denies any stomach pain. Patient states has had normal bowel movements no significant constipation or diarrhea. Patient denies any dysuria, hematuria or significant nocturia. Patient denies any problems walking, swelling in the joints or loss of balance. Patient denies any skin changes, loss of hair or loss of weight. Patient denies any excessive worrying or anxiety or significant depression. Patient denies any problems with insomnia. Patient denies excessive thirst, polyuria, polydipsia. Patient denies any swollen glands, patient denies easy bruising or easy bleeding. Patient denies any recent infections, allergies or URI. Patient "s visual fields have not changed significantly in  recent time.    PHYSICAL EXAM: BP 133/51 mmHg  Pulse 68  Temp(Src) 97.5 F (36.4 C) (Oral)  Resp 18  Ht 5\' 2"  (1.575 m)  Wt 270 lb (122.471 kg)  BMI 49.37 kg/m2  SpO2 99% Well-developed obese female in her hospital bed in NAD. No motor or sensory levels appreciated. She has good motor strength in her lower extremities bilaterally. Proprioception appears to be decreased in both lower extremities. Well-developed well-nourished patient in NAD. HEENT reveals PERLA, EOMI, discs not visualized.  Oral cavity is clear. No oral mucosal lesions are identified. Neck is clear without evidence of cervical or supraclavicular adenopathy. Lungs are clear to A&P. Cardiac examination is essentially unremarkable with regular rate and rhythm without murmur rub or thrill. Abdomen is benign with no organomegaly or masses noted. Motor sensory and DTR levels are equal and symmetric in the upper and lower extremities. Cranial nerves II through XII are grossly intact. Proprioception is intact. No peripheral adenopathy or edema is identified. No motor or sensory levels are noted. Crude visual fields are within normal range.  LABORATORY DATA: Will review pathology when available    RADIOLOGY RESULTS: CT scans reviewed bone scan ordered   IMPRESSION: Stage IV breast cancer with significant involvement of T33 in 65 year old female with known locally advanced breast cancer  PLAN: At this time I to ahead with palliative radiation therapy to her thoracic lumbar spine. I've ordered a bone scan to better highlight areas of bony involvement. Initially my thoughts are to treat with 3000 cGy in 10 fractions I've ordered and scheduled CT simulation for tomorrow. Risks and benefits of treatment including possible diarrhea dysuria fatigue alteration of blood counts skin reaction all were discussed in detail with the patient.  I would like to take this opportunity for allowing me to participate in the care of your  patient.Armstead Peaks., MD

## 2015-12-24 NOTE — Transfer of Care (Signed)
Immediate Anesthesia Transfer of Care Note  Patient: Madeline [name]  Procedure(s) Performed: Procedure(s): KYPHOPLASTY (N/A)  Patient Location: PACU  Anesthesia Type:MAC  Level of Consciousness: awake, alert  and oriented  Airway & Oxygen Therapy: Patient Spontanous Breathing  Post-op Assessment: Report given to RN and Post -op Vital signs reviewed and stable  Post vital signs: Reviewed and stable  Last Vitals:  Filed Vitals:   12/24/15 1339 12/24/15 1436  BP: 122/44 108/64  Pulse: 67 66  Temp: 36.6 C 35.9 C  Resp: 18 18    Complications: No apparent anesthesia complications

## 2015-12-24 NOTE — Progress Notes (Signed)
[facility] NAME: Madeline [name]    MR#:  [id number]  DATE OF BIRTH:  Sep 17, 1951  SUBJECTIVE:  CHIEF COMPLAINT:   Chief Complaint  Patient presents with  . Back Pain   back pain is better with the pain medications. Had BM.  REVIEW OF SYSTEMS:  CONSTITUTIONAL: No fever, generalized weakness.  EYES: No blurred or double vision.  EARS, NOSE, AND THROAT: No tinnitus or ear pain.  RESPIRATORY: No cough, shortness of breath, wheezing or hemoptysis.  CARDIOVASCULAR: No chest pain, orthopnea, edema.  GASTROINTESTINAL: No nausea, vomiting, diarrhea or abdominal pain. No Constipation. GENITOURINARY: No dysuria, hematuria.  ENDOCRINE: No polyuria, nocturia,  HEMATOLOGY: No anemia, easy bruising or bleeding SKIN: No rash or lesion. MUSCULOSKELETAL: Back pain.   NEUROLOGIC: No tingling, numbness, weakness.  PSYCHIATRY: No anxiety or depression.   DRUG ALLERGIES:   Allergies  Allergen Reactions  . Penicillins Swelling    tongue and throat swelling tolerates cephalosporins  . Sulfa Antibiotics Hives and Itching  . Trovan [Alatrofloxacin] Hives  . Rosiglitazone Maleate Swelling    VITALS:  Blood pressure 133/51, pulse 68, temperature 97.5 F (36.4 C), temperature source Oral, resp. rate 18, height 5\' 2"  (1.575 m), weight 122.471 kg (270 lb), SpO2 99 %.  PHYSICAL EXAMINATION:  GENERAL:  65 y.o.-year-old patient lying in the bed with no acute distress. Morbid obesity. EYES: Pupils equal, round, reactive to light and accommodation. No scleral icterus. Extraocular muscles intact.  HEENT: Head atraumatic, normocephalic. Oropharynx and nasopharynx clear.  NECK:  Supple, no jugular venous distention. No thyroid enlargement, no tenderness.  LUNGS: Normal breath sounds bilaterally, no wheezing, rales,rhonchi or crepitation. No use of accessory muscles of respiration.  CARDIOVASCULAR: S1, S2 normal. No murmurs, rubs, or gallops.   ABDOMEN: Soft, nontender, nondistended. Bowel sounds present. No organomegaly or mass.  EXTREMITIES: No pedal edema, cyanosis, or clubbing.  NEUROLOGIC: Cranial nerves II through XII are intact. Muscle strength 4/5 in lower extremities. Sensation intact. Gait not checked.  PSYCHIATRIC: The patient is alert and oriented x 3.  SKIN: No obvious rash, lesion, or ulcer.    LABORATORY PANEL:   CBC  Recent Labs Lab 12/21/15 0502  WBC 7.6  HGB 11.3*  HCT 34.8*  PLT 200   ------------------------------------------------------------------------------------------------------------------  Chemistries   Recent Labs Lab 12/20/15 1650 12/21/15 0502  NA 144 143  K 4.0 4.0  CL 101 104  CO2 34* 33*  GLUCOSE 74 111*  BUN 17 15  CREATININE 0.80 0.69  CALCIUM 9.6 9.0  AST 29  --   ALT 24  --   ALKPHOS 145*  --   BILITOT 0.8  --    ------------------------------------------------------------------------------------------------------------------  Cardiac Enzymes No results for input(s): TROPONINI in the last 168 hours. ------------------------------------------------------------------------------------------------------------------  RADIOLOGY:  No results found.  EKG:   Orders placed or performed during the hospital encounter of 05/05/15  . ED EKG  . ED EKG  . EKG    ASSESSMENT AND PLAN:   #1. Acute T 10 and L1 compression fracture with resultant lower back pain, Continue high doses of oxycodone, Dilaudid prn. Biopsy and kyphoplasty today per Dr Rudene Christians.  #2. 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  Per Dr. Wynell Balloon, The patient should undergo a biopsy of the most accessible lesion. Per radiation oncologist, treat with 3000 cGy in 10 fractions I've ordered  and scheduled CT simulation for tomorrow.  #3 anemia of unclear etiology at this time,  likely anemia of chronic disease,  Stable. #4 hypertension, continue outpatient medications. #5. Hyperlipidemia, continue outpatient medications, #6. COPD, stable, ruled out pulmonary embolism per CTA.   * DM2. Continue Lantus and sliding scale. Hold metformin. * Tobacco abuse. Smoking cessation was counseled for 3 minutes. * Constipation. Improved. Lactulose when necessary. Continue Colace.  All the records are reviewed and case discussed with Care Management/Social Workerr. Management plans discussed with the patient, her daughter and they are in agreement.  CODE STATUS: Full code  TOTAL TIME TAKING CARE OF THIS PATIENT: 36 minutes.  Greater than 50% time was spent on coordination of care and face-to-face counseling.  POSSIBLE D/C IN 2 DAYS, DEPENDING ON CLINICAL CONDITION.   Demetrios Loll M.D on 12/24/2015 at 1:30 PM  Between 7am to 6pm - Pager - [phone]  After 6pm go to [email or web address] - password [email or web address]  Office  [phone]  CC: Primary care physician; Viviana Simpler, MD

## 2015-12-24 NOTE — Op Note (Signed)
12/20/2015 - 12/24/2015  4:41 PM  PATIENT:  Madeline [name]  65 y.o. female  PRE-OPERATIVE DIAGNOSIS:  t10, L1 fracture with metastatic disease, unknown primary  POST-OPERATIVE DIAGNOSIS:  t10 fracture L1  PROCEDURE:  Procedure(s): KYPHOPLASTY (N/A) T10 and L1  SURGEON: Laurene Footman, MD  ASSISTANTS: None  ANESTHESIA:   local and MAC  EBL:  Total I/O In: -  Out: 1200 [Urine:1200]  BLOOD ADMINISTERED:none  DRAINS: none   LOCAL MEDICATIONS USED:  MARCAINE    and XYLOCAINE   SPECIMEN:  Source of Specimen:  T10 and L1 vertebral bodies  DISPOSITION OF SPECIMEN:  PATHOLOGY  COUNTS:  YES  TOURNIQUET:  * No tourniquets in log *  IMPLANTS: Bone cement  DICTATION: .Dragon Dictation patient brought the operating room in and after placing her prone with minimal sedation secondary to breathing issues local C-arm was brought in and good visualization of both AP and lateral projections was obtained. Timeout procedure was patient identification completed and 10 cc 1% Xylocaine infiltrated subcutaneously. The back was then prepped draped in sterile fashion and repeat timeout procedure carried out. Spinal needle was then used to get down to the pedicle on the left at T10 as the right one had been destroyed secondary to tumor. A 50-50 mix of 1% Xylocaine have percent Sensorcaine with epinephrine was injected and then a trocar advanced into the body with specimen obtained. A balloon was inserted and approximately 2 cc inflation for subsequent kyphoplasty. Identical procedures carried out on the right at L1. With biopsy also obtained at that level. Balloon was inflated approximate 3 cc. Bone cement was mixed at this time and L1 filled with approximate 3 cc and 3 cc at T10 getting good fill without extravasation. Trochars removed after the cement had set and Dermabond used to close the skin followed by Band-Aids  PLAN OF CARE: Continue as inpatient  PATIENT DISPOSITION:  PACU - hemodynamically  stable.

## 2015-12-24 NOTE — Care Management Important Message (Signed)
Important Message  Patient Details  Name: Madeline [name] MRN: [id number] Date of Birth: Jan 27, 1951   Medicare Important Message Given:  Yes    Juliann Pulse A Cheyne Boulden 12/24/2015, 9:31 AM

## 2015-12-24 NOTE — Progress Notes (Signed)
[place]  [phone] [phone]  ID: Madeline [name] OB: 05/10/1951  MR#: [id number]  [id number]  Patient Care Team: Venia Carbon, MD as PCP - General  CHIEF COMPLAINT:  Chief Complaint  Patient presents with  . Back Pain    INTERVAL HISTORY: Patient's pain has improved, she is status post kyphoplasty earlier today. She offers no further complaints.  REVIEW OF SYSTEMS:   Review of Systems  Constitutional: Negative for fever and malaise/fatigue.  Cardiovascular: Negative.   Gastrointestinal: Negative.   Musculoskeletal: Positive for back pain.  Neurological: Negative.  Negative for weakness.    As per HPI. Otherwise, a complete review of systems is negatve.  PAST MEDICAL HISTORY: Past Medical History  Diagnosis Date  . Depression   . Goiter, nodular     multi  . Dyslipidemia   . NIDDM, uncontrolled, with neuropathy   . Hypertension   . Allergy   . GERD (gastroesophageal reflux disease)   . Urinary incontinence   . Peripheral neuropathy ([place])   . Multiple sclerosis ([place])   . Breast cancer ([place])   . Hypothyroidism   . Asthma   . COPD (chronic obstructive pulmonary disease) ([place])   . DVT (deep venous thrombosis) (H[facility]) 1990's    right leg  . Gout   . Femur fracture ([place])   . Frequent UTI     PAST SURGICAL HISTORY: Past Surgical History  Procedure Laterality Date  . Thyroidectomy  09/2004  . Transthoracic echocardiogram  05/16/2004  . Partial hysterectomy  1975  . Axillary hidradenitis excision  1993    Excision biopsy growth right axilla, benign   . Other surgical history      Thyroid biopsy 10/99  . Carpal tunnel release  03/2008    bilateral  . Mastectomy, radical  02/2009    left modified  . Tonsillectomy    . Appendectomy    . Femur fracture surgery Left june 2016    FAMILY HISTORY Family History  Problem Relation Age of Onset  . Kidney failure Mother   . Heart disease Father 2  . Heart  disease Brother   . Heart disease Brother        ADVANCED DIRECTIVES:    HEALTH MAINTENANCE: Social History  Substance Use Topics  . Smoking status: Current Every Day Smoker -- 0.00 packs/day for 50 years    Types: Cigarettes    Last Attempt to Quit: 07/12/2015  . Smokeless tobacco: Never Used     Comment: almost stopped, Pt states smokes 1 cigarette a day.  . Alcohol Use: No     Colonoscopy:  PAP:  Bone density:  Lipid panel:  Allergies  Allergen Reactions  . Penicillins Swelling    tongue and throat swelling tolerates cephalosporins  . Sulfa Antibiotics Hives and Itching  . Trovan [Alatrofloxacin] Hives  . Rosiglitazone Maleate Swelling    Current Facility-Administered Medications  Medication Dose Route Frequency Provider Last Rate Last Dose  . 0.9 %  sodium chloride infusion   Intravenous Continuous Molli Barrows, MD 50 mL/hr at 12/24/15 1515    . albuterol (PROVENTIL) (2.5 MG/3ML) 0.083% nebulizer solution 2.5 mg  2.5 mg Nebulization Q6H PRN Theodoro Grist, MD      . allopurinol (ZYLOPRIM) tablet 300 mg  300 mg Oral Daily Theodoro Grist, MD   300 mg at 12/24/15 0949  . aspirin tablet 325 mg  325 mg Oral Daily Theodoro Grist, MD   325 mg at 12/23/15 0843  .  bisacodyl (DULCOLAX) EC tablet 5 mg  5 mg Oral Daily PRN Theodoro Grist, MD      . budesonide-formoterol (SYMBICORT) 160-4.5 MCG/ACT inhaler 1 puff  1 puff Inhalation BID Theodoro Grist, MD   1 puff at 12/24/15 0949  . carvedilol (COREG) tablet 25 mg  25 mg Oral BID WC Theodoro Grist, MD   25 mg at 12/24/15 [id number]  . cholecalciferol (VITAMIN D) tablet 4,000 Units  4,000 Units Oral q morning - 10a Theodoro Grist, MD   4,000 Units at 12/24/15 0949  . diazepam (VALIUM) tablet 5 mg  5 mg Oral BID Demetrios Loll, MD   5 mg at 12/24/15 0949  . docusate sodium (COLACE) capsule 100 mg  100 mg Oral BID Theodoro Grist, MD   100 mg at 12/24/15 0949  . FLUoxetine (PROZAC) capsule 20 mg  20 mg Oral Daily Theodoro Grist, MD   20 mg at 12/24/15  0949  . furosemide (LASIX) tablet 80 mg  80 mg Oral Daily Theodoro Grist, MD   80 mg at 12/24/15 0949  . gabapentin (NEURONTIN) capsule 600 mg  600 mg Oral QID Hillary Bow, MD   600 mg at 12/24/15 1342  . HYDROmorphone (DILAUDID) injection 1 mg  1 mg Intravenous Q2H PRN Theodoro Grist, MD   1 mg at 12/24/15 1158  . insulin aspart (novoLOG) injection 0-20 Units  0-20 Units Subcutaneous TID WC Theodoro Grist, MD   4 Units at 12/23/15 1701  . insulin aspart (novoLOG) injection 0-5 Units  0-5 Units Subcutaneous QHS Theodoro Grist, MD   0 Units at 12/20/15 2208  . insulin glargine (LANTUS) injection 47 Units  47 Units Subcutaneous BID Theodoro Grist, MD   47 Units at 12/23/15 2111  . lactulose (CHRONULAC) 10 GM/15ML solution 20 g  20 g Oral BID PRN Demetrios Loll, MD   20 g at 12/23/15 1701  . levothyroxine (SYNTHROID, LEVOTHROID) tablet 175 mcg  175 mcg Oral QAC breakfast Theodoro Grist, MD   175 mcg at 12/24/15 [id number]  . omega-3 acid ethyl esters (LOVAZA) capsule 2 g  2 g Oral Daily Theodoro Grist, MD   2 g at 12/24/15 0949  . ondansetron (ZOFRAN) tablet 4 mg  4 mg Oral Q6H PRN Theodoro Grist, MD       Or  . ondansetron (ZOFRAN) injection 4 mg  4 mg Intravenous Q6H PRN Theodoro Grist, MD      . oxyCODONE (Oxy IR/ROXICODONE) immediate release tablet 10 mg  10 mg Oral Q4H PRN Theodoro Grist, MD   10 mg at 12/24/15 1900  . pantoprazole (PROTONIX) EC tablet 40 mg  40 mg Oral Daily Theodoro Grist, MD   40 mg at 12/24/15 0950  . potassium chloride SA (K-DUR,KLOR-CON) CR tablet 20 mEq  20 mEq Oral Daily Demetrios Loll, MD   20 mEq at 12/24/15 0950  . senna (SENOKOT) tablet 8.6 mg  1 tablet Oral BID Theodoro Grist, MD   8.6 mg at 12/24/15 0949  . tiotropium (SPIRIVA) inhalation capsule 18 mcg  18 mcg Inhalation Daily Theodoro Grist, MD   18 mcg at 12/24/15 0842  . traZODone (DESYREL) tablet 200 mg  200 mg Oral QHS Theodoro Grist, MD   200 mg at 12/23/15 2111    OBJECTIVE: Filed Vitals:   12/24/15 1720 12/24/15 1737  BP:  151/111 129/45  Pulse: 54 71  Temp: 98.1 F (36.7 C) 97.8 F (36.6 C)  Resp: 15 18     Body mass index is 49.37 kg/(m^2).  ECOG FS:2 - Symptomatic, <50% confined to bed  General: Well-developed, well-nourished, no acute distress. Eyes: Pink conjunctiva, anicteric sclera. Lungs: Clear to auscultation bilaterally. Heart: Regular rate and rhythm. No rubs, murmurs, or gallops. Abdomen: Soft, nontender, nondistended. No organomegaly noted, normoactive bowel sounds. Musculoskeletal: No edema, cyanosis, or clubbing. Neuro: Alert, answering all questions appropriately. Cranial nerves grossly intact. Skin: No rashes or petechiae noted. Psych: Normal affect.   LAB RESULTS:  Lab Results  Component Value Date   NA 143 12/21/2015   K 4.0 12/21/2015   CL 104 12/21/2015   CO2 33* 12/21/2015   GLUCOSE 111* 12/21/2015   BUN 15 12/21/2015   CREATININE 0.69 12/21/2015   CALCIUM 9.0 12/21/2015   PROT 7.2 12/20/2015   ALBUMIN 3.8 12/20/2015   AST 29 12/20/2015   ALT 24 12/20/2015   ALKPHOS 145* 12/20/2015   BILITOT 0.8 12/20/2015   GFRNONAA >60 12/21/2015   GFRAA >60 12/21/2015    Lab Results  Component Value Date   WBC 7.6 12/21/2015   NEUTROABS 4.2 12/20/2015   HGB 11.3* 12/21/2015   HCT 34.8* 12/21/2015   MCV 81.7 12/21/2015   PLT 200 12/21/2015     STUDIES: Dg Thoracic Spine 2 View  12/24/2015  CLINICAL DATA:  Kyphoplasty in OR at T10 and L1, breast cancer, thoracic spine metastatic disease EXAM: THORACIC SPINE 2 VIEWS ; DG C-ARM 61-120 MIN COMPARISON:  CT chest 12/20/2015 FLUOROSCOPY TIME:  3 minutes 7 seconds Submitted images:  4 FINDINGS: Diffuse osseous demineralization. Patient has had spinal augmentation procedures performed at what appear to be the T10 and L1 vertebra, assignment of spinal levels difficult due to lack of landmarks. Additional superior endplate compression fracture of what is likely the T12 vertebral body. No subluxation seen. IMPRESSION: Spinal  augmentation procedures at what are likely T10 and L1 with additional anterior compression fracture identified at probable T12. Electronically Signed   By: Lavonia Dana M.D.   On: 12/24/2015 16:53   Dg Lumbar Spine 2-3 Views  12/20/2015  CLINICAL DATA:  Low back pain for 3 days EXAM: LUMBAR SPINE - 2-3 VIEW COMPARISON:  None. FINDINGS: Extremely limited evaluation secondary to body habitus and technique. There is L1 vertebral body height loss concerning for a compression fracture of indeterminate age. Alignment is normal. Intervertebral disc spaces are maintained. IMPRESSION: Age-indeterminate L1 vertebral body compression fracture. Electronically Signed   By: Kathreen Devoid   On: 12/20/2015 12:41   Ct Angio Chest Pe W/cm &/or Wo Cm  12/20/2015  CLINICAL DATA:  History of breast cancer. Patient complains of chronic back pain worsened over last 3 days becoming unbearable. EXAM: CT ANGIOGRAPHY CHEST WITH CONTRAST TECHNIQUE: Multidetector CT imaging of the chest was performed using the standard protocol during bolus administration of intravenous contrast. Multiplanar CT image reconstructions and MIPs were obtained to evaluate the vascular anatomy. CONTRAST:  113mL OMNIPAQUE IOHEXOL 350 MG/ML SOLN COMPARISON:  December 16, 2014 FINDINGS: There is no pulmonary embolus. There is atherosclerosis of the thoracic aorta without dissection or aneurysm. The heart size is normal. There is no pericardial effusion. There is no mediastinal or hilar lymphadenopathy. Patient status post prior left breast surgery with postsurgical scar and clips. Images of the lungs demonstrate tiny focal peripheral scar of the bilateral lung apices unchanged compared to prior exam. There is no pulmonary mass or focal pneumonia. There is no pleural effusion. Images of the visualized upper abdominal structures demonstrate gallstones within the gallbladder. The other visualized upper abdominal structures are unremarkable.  There is deformity of the  anterior aspect of the left mid kidney not completely included. Images of the bones demonstrate mixed lucency and sclerosis identified throughout the spine consistent consistent with bone metastasis. This is worse at the T10 level with marked bony destruction with abnormal soft tissue extending into the spinal canal narrowing the spinal canal and obliterating the right neural foraman. Review of the MIP images confirms the above findings. IMPRESSION: No pulmonary embolus. No acute abnormality identified in the chest. 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 The the metastatic changes worst at the T10 level with marked bony destruction and abnormal soft tissue extending into the spinal canal, narrowing the spinal canal and obliterating the right neural foramen. Core compression is not excluded. Further evaluation with MRI is recommended. These results will be called to the ordering clinician or representative by the Radiologist Assistant, and communication documented in the PACS or zVision Dashboard. Electronically Signed   By: Abelardo Diesel M.D.   On: 12/20/2015 20:19   Dg C-arm 61-120 Min  12/24/2015  CLINICAL DATA:  Kyphoplasty in OR at T10 and L1, breast cancer, thoracic spine metastatic disease EXAM: THORACIC SPINE 2 VIEWS ; DG C-ARM 61-120 MIN COMPARISON:  CT chest 12/20/2015 FLUOROSCOPY TIME:  3 minutes 7 seconds Submitted images:  4 FINDINGS: Diffuse osseous demineralization. Patient has had spinal augmentation procedures performed at what appear to be the T10 and L1 vertebra, assignment of spinal levels difficult due to lack of landmarks. Additional superior endplate compression fracture of what is likely the T12 vertebral body. No subluxation seen. IMPRESSION: Spinal augmentation procedures at what are likely T10 and L1 with additional anterior compression fracture identified at probable T12. Electronically Signed   By: Lavonia Dana M.D.   On: 12/24/2015 16:53   Ct Renal Stone  Study  12/20/2015  CLINICAL DATA:  Bilateral back pain for 3 days EXAM: CT ABDOMEN AND PELVIS WITHOUT CONTRAST TECHNIQUE: Multidetector CT imaging of the abdomen and pelvis was performed following the standard protocol without IV contrast. COMPARISON:  None. FINDINGS: Lung bases are free of acute infiltrate or sizable effusion. The liver, spleen, adrenal glands and pancreas are all normal in their CT appearance. The gallbladder is well distended with multiple dependent gallstones. No biliary obstructive changes are seen. Kidneys are well visualized bilaterally without evidence of renal calculi or urinary tract obstructive changes. The bladder is partially distended. Aortoiliac calcifications are noted without aneurysmal dilatation. The appendix is within normal limits. The osseous structures show postsurgical changes in the proximal left femur. There also multiple lytic lesions consistent with metastatic disease. Significant destruction is noted in the T10 vertebral body on the right with some impingement upon the thecal sac. Lytic lesions are also noted anteriorly in the L1 vertebral body as well within the L3, L4 and L5 vertebral bodies. IMPRESSION: Cholelithiasis without complicating factors. Multiple lytic lesions within the thoracic and lumbar spine consistent with metastatic breast cancer. Compression deformity of uncertain chronicity is noted at T12. The T10 lesion encroaches upon the thecal sac. Further evaluation by means of MRI is recommended. Electronically Signed   By: Inez Catalina M.D.   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 Metastatic breast cancer: Patient had kyphoplasty and biopsy today, pathology results are pending. Previously she had ER/PR positive breast cancer diagnosed over 6 years ago. She completed 5 years of hormonal therapy in May 2015. Radiation oncology has also been consulted with  plans of palliative radiation in the next several  days. CA-27-29 continues to be within normal limits.  Patient has been instructed to follow-up in the [facility] 1-2 weeks after discharge to discuss treatment planning if desired.  Will follow.   Lloyd Huger, MD   12/24/2015 7:24 PM

## 2015-12-25 ENCOUNTER — Encounter: Payer: Self-pay | Admitting: Radiology

## 2015-12-25 ENCOUNTER — Inpatient Hospital Stay: Payer: Medicare Other

## 2015-12-25 ENCOUNTER — Other Ambulatory Visit: Payer: Self-pay

## 2015-12-25 ENCOUNTER — Other Ambulatory Visit: Payer: Self-pay | Admitting: *Deleted

## 2015-12-25 ENCOUNTER — Ambulatory Visit
Admit: 2015-12-25 | Discharge: 2015-12-25 | Disposition: A | Discharge status: Home / Self Care | Payer: Medicare Other | Source: Ambulatory Visit | Attending: Radiation Oncology | Admitting: Radiation Oncology

## 2015-12-25 DIAGNOSIS — C7951 Secondary malignant neoplasm of bone: Secondary | ICD-10-CM | POA: Insufficient documentation

## 2015-12-25 DIAGNOSIS — C50919 Malignant neoplasm of unspecified site of unspecified female breast: Secondary | ICD-10-CM

## 2015-12-25 DIAGNOSIS — Z510 Encounter for antineoplastic radiation therapy: Secondary | ICD-10-CM | POA: Insufficient documentation

## 2015-12-25 DIAGNOSIS — C50912 Malignant neoplasm of unspecified site of left female breast: Secondary | ICD-10-CM | POA: Insufficient documentation

## 2015-12-25 LAB — GLUCOSE, CAPILLARY
GLUCOSE-CAPILLARY: 152 mg/dL — AB (ref 65–99)
GLUCOSE-CAPILLARY: 142 mg/dL — AB (ref 65–99)
GLUCOSE-CAPILLARY: 217 mg/dL — AB (ref 65–99)
GLUCOSE-CAPILLARY: 190 mg/dL — AB (ref 65–99)
Glucose-Capillary: 138 mg/dL — ABNORMAL HIGH (ref 65–99)

## 2015-12-25 MED ORDER — COPAXONE 40 MG/ML ~~LOC~~ SOSY: 40.0000 mg | PREFILLED_SYRINGE | SUBCUTANEOUS | Status: DC

## 2015-12-25 MED ORDER — TECHNETIUM TC 99M MEDRONATE IV KIT
25.0000 | PACK | Freq: Once | INTRAVENOUS | Status: AC | PRN
  Administered 2015-12-25: 22.25 via INTRAVENOUS

## 2015-12-25 NOTE — Progress Notes (Signed)
   Subjective: 1 Day Post-Op Procedure(s) (LRB): KYPHOPLASTY (N/A) Patient reports pain as 5 on 0-10 scale.  Improved after surgery. Patient is well, and has had no acute complaints or problems Denies any CP, SOB, ABD pain. We will continue therapy today.   Objective: Vital signs in last 24 hours: Temp:  [96.7 F (35.9 C)-98.1 F (36.7 C)] 97.7 F (36.5 C) (02/14 [id number]) Pulse Rate:  [54-75] 72 (02/14 0608) Resp:  [15-22] 16 (02/14 0608) BP: (108-151)/(44-111) 116/48 mmHg (02/14 0608) SpO2:  [89 %-98 %] 96 % (02/14 0608) Weight:  [122.471 kg (270 lb)] 122.471 kg (270 lb) (02/13 1436)  Intake/Output from previous day: 02/13 0701 - 02/14 0700 In: 985.8 [I.V.:985.8] Out: [id number] [Urine:3450; Blood:20] Intake/Output this shift: Total I/O In: -  Out: 700 [Urine:700]  No results for input(s): HGB in the last 72 hours. No results for input(s): WBC, RBC, HCT, PLT in the last 72 hours. No results for input(s): NA, K, CL, CO2, BUN, CREATININE, GLUCOSE, CALCIUM in the last 72 hours. No results for input(s): LABPT, INR in the last 72 hours.  EXAM General - Patient is Alert, Appropriate and Oriented  Thoracic and lumbar spine- mild tenderness to palpation along spinous process at thoracolumbar junction Extremity - Neurovascular intact Sensation intact distally Intact pulses distally Dressing - dressing C/D/I and no drainage Motor Function - intact, moving foot and toes well on exam.   Past Medical History  Diagnosis Date  . Depression   . Goiter, nodular     multi  . Dyslipidemia   . NIDDM, uncontrolled, with neuropathy   . Hypertension   . Allergy   . GERD (gastroesophageal reflux disease)   . Urinary incontinence   . Peripheral neuropathy ([place])   . Multiple sclerosis ([place])   . Breast cancer ([place])   . Hypothyroidism   . Asthma   . COPD (chronic obstructive pulmonary disease) ([place])   . DVT (deep venous thrombosis) ([facility]) 1990's    right leg  . Gout   . Femur fracture  ([place])   . Frequent UTI     Assessment/Plan:   1 Day Post-Op Procedure(s) (LRB): KYPHOPLASTY (N/A) Principal Problem:   Back pain Active Problems:   Metastatic carcinoma ([facility])   Dyspnea   Obesity  Estimated body mass index is 49.37 kg/(m^2) as calculated from the following:   Height as of this encounter: 5\' 2"  (1.575 m).   Weight as of this encounter: 122.471 kg (270 lb). Advance diet Up with therapy, progress activity as tolerated Follow up with [facility] ortho in 2 weeks   D/C O2 and Pulse OX and try on Room Air  T. Rachelle Hora, PA-C [place] 12/25/2015, 10:01 AM

## 2015-12-25 NOTE — Progress Notes (Signed)
PT Cancellation Note  Patient Details Name: Madeline [name] MRN: [id number] DOB: July 15, 1951   Cancelled Treatment:    Reason Eval/Treat Not Completed: Patient at procedure or test/unavailable. Will re-attempt at later time.   Cassie Shedlock 12/25/2015, 9:33 AM  Greggory Stallion, PT, DPT [phone]

## 2015-12-25 NOTE — Evaluation (Signed)
Physical Therapy Evaluation Patient Details Name: Madeline [name] MRN: [id number] DOB: [birth year] Today's Date: 12/25/2015   History of Present Illness  Pt admitted for complaints of back pain and is now s/p kyphoplasty at T10 and L1 level as well as biospy at T10. Pt with history of metastatic carcinoma, obesity, DM, neuropathy, MS, and COPD.  During this stay, pt also noted with multiple lesions in thoracic and lumbar spine that may be related to previous metastiatic breast cancer. Pt also wears chronic 2L of O2 at night. Of note, pt with recent femur fracture in 2016.   Clinical Impression  Pt is a pleasant 65 year old female who was admitted for complaints of back pain and is now s/p kyphoplasty on multiple levels. Pt performs bed mobility with min assist, transfers with supervision, and ambulation with cga and rw. Pt educated on back precautions and the correct way to transfer in/out of bed. Pt is very motivated. All mobility performed on 3L of O2 with sats WNL. Pt has great support and equipment in home. Would like to resume HHPT. Pt demonstrates deficits with strength/pain/mobility. Would benefit from skilled PT to address above deficits and promote optimal return to PLOF      Follow Up Recommendations Home health PT;Supervision - Intermittent    Equipment Recommendations       Recommendations for Other Services       Precautions / Restrictions Precautions Precautions: Fall;Back Precaution Booklet Issued: No Precaution Comments: Pt educated on precations Restrictions Weight Bearing Restrictions: No      Mobility  Bed Mobility Overal bed mobility: Needs Assistance Bed Mobility: Supine to Sit     Supine to sit: Min assist     General bed mobility comments: assist for scooting out towards EOB. Once seated at EOB, pt able to sit with independence  Transfers Overall transfer level: Needs assistance Equipment used: Rolling walker (2 wheeled) Transfers: Sit to/from  Stand Sit to Stand: Supervision         General transfer comment: transfers performed with safe technique, pushing from seated surface. Once standing, no LOB noted.  Ambulation/Gait Ambulation/Gait assistance: Min guard Ambulation Distance (Feet): 5 Feet Assistive device: Rolling walker (2 wheeled) Gait Pattern/deviations: Step-to pattern     General Gait Details: forward/backward ambulation performed as pt in too much pain to increase distance. RW used with safe technique. Shoes donned prior to ambulation  Information systems manager [place] (Stroke Patients Only)       Balance Overall balance assessment: Needs assistance Sitting-balance support: Feet supported Sitting balance-Leahy Scale: Good     Standing balance support: Bilateral upper extremity supported Standing balance-Leahy Scale: Good                               Pertinent Vitals/Pain Pain Assessment: Faces Faces Pain Scale: Hurts little more Pain Location: low back Pain Descriptors / Indicators: Operative site guarding Pain Intervention(s): Limited activity within patient's tolerance;Premedicated before session    Home Living Family/patient expects to be discharged to:: Private residence Living Arrangements: Children Available Help at Discharge: Family;Available PRN/intermittently Type of Home: House Home Access: Ramped entrance     Home Layout: One level Home Equipment: Walker - 2 wheels;Walker - 4 wheels;Wheelchair - manual;Wheelchair - power Additional Comments: was receiving HHPT prior to admission    Prior Function Level of Independence: Needs assistance  Gait / Transfers Assistance Needed: assist given for household mobility, uses WC for community distances           Hand Dominance        Extremity/Trunk Assessment   Upper Extremity Assessment: Overall WFL for tasks assessed           Lower Extremity Assessment: Generalized weakness  (grossly 4+/5)         Communication   Communication: No difficulties  Cognition Arousal/Alertness: Awake/alert Behavior During Therapy: WFL for tasks assessed/performed Overall Cognitive Status: Within Functional Limits for tasks assessed                      General Comments      Exercises        Assessment/Plan    PT Assessment Patient needs continued PT services  PT Diagnosis Difficulty walking;Generalized weakness;Acute pain   PT Problem List Decreased strength;Decreased mobility;Decreased knowledge of use of DME;Decreased safety awareness;Pain;Decreased knowledge of precautions;Decreased activity tolerance  PT Treatment Interventions DME instruction;Gait training;Therapeutic exercise   PT Goals (Current goals can be found in the Care Plan section) Acute Rehab PT Goals Patient Stated Goal: to get stronger PT Goal Formulation: With patient Time For Goal Achievement: 01/08/16 Potential to Achieve Goals: Good    Frequency BID   Barriers to discharge        Co-evaluation               End of Session Equipment Utilized During Treatment: Oxygen Activity Tolerance: Patient limited by pain Patient left: in bed;with bed alarm set Nurse Communication: Mobility status         Time: 1349-1414 PT Time Calculation (min) (ACUTE ONLY): 25 min   Charges:   PT Evaluation $PT Eval Moderate Complexity: 1 Procedure     PT G Codes:        Naydelin Ziegler Jan 17, 2016, 4:45 PM [name], PT, DPT [phone]

## 2015-12-25 NOTE — Clinical Documentation Improvement (Addendum)
Hospitalist and/or Orthopedics  (Query responses must be documented in the current medical record, not on the CDI BPA form.)  Please document if a condition below provides greater specificity regarding the patient's "compression fractures"  Possible Clinical Conditions:  - Pathological fractures secondary to metastatic cancer  - Other type of fracture  - Unable to clinically determine  Clinical Information: "Acute T 10 and L1 compression fracture with resultant lower back pain,  Continue high doses of oxycodone, Dilaudid prn. S/p Biopsy and kyphoplasty by Dr Rudene Christians" documented in Dr. Lianne Moris progress note 12/25/15    Please exercise your independent, professional judgment when responding. A specific answer is not anticipated or expected.   Thank You,  Erling Conte  RN BSN CCDS [phone] Health Information Management [place]

## 2015-12-25 NOTE — Progress Notes (Signed)
[facility] NAME: Madeline [name]    MR#:  [id number]  DATE OF BIRTH:  [birth year]  SUBJECTIVE:  CHIEF COMPLAINT:   Chief Complaint  Patient presents with  . Back Pain  No complaint.  REVIEW OF SYSTEMS:  CONSTITUTIONAL: No fever, no weakness.  EYES: No blurred or double vision.  EARS, NOSE, AND THROAT: No tinnitus or ear pain.  RESPIRATORY: No cough, shortness of breath, wheezing or hemoptysis.  CARDIOVASCULAR: No chest pain, orthopnea, edema.  GASTROINTESTINAL: No nausea, vomiting, diarrhea or abdominal pain. No Constipation. GENITOURINARY: No dysuria, hematuria.  ENDOCRINE: No polyuria, nocturia,  HEMATOLOGY: No anemia, easy bruising or bleeding SKIN: No rash or lesion. MUSCULOSKELETAL: back pain.   NEUROLOGIC: No tingling, numbness, weakness.  PSYCHIATRY: No anxiety or depression.   DRUG ALLERGIES:   Allergies  Allergen Reactions  . Penicillins Swelling    tongue and throat swelling tolerates cephalosporins  . Sulfa Antibiotics Hives and Itching  . Trovan [Alatrofloxacin] Hives  . Rosiglitazone Maleate Swelling    VITALS:  Blood pressure 116/48, pulse 72, temperature 97.7 F (36.5 C), temperature source Oral, resp. rate 16, height 5\' 2"  (1.575 m), weight 122.471 kg (270 lb), SpO2 96 %.  PHYSICAL EXAMINATION:  GENERAL:  65 y.o.-year-old patient lying in the bed with no acute distress. Morbid obesity. EYES: Pupils equal, round, reactive to light and accommodation. No scleral icterus. Extraocular muscles intact.  HEENT: Head atraumatic, normocephalic. Oropharynx and nasopharynx clear.  NECK:  Supple, no jugular venous distention. No thyroid enlargement, no tenderness.  LUNGS: Normal breath sounds bilaterally, no wheezing, rales,rhonchi or crepitation. No use of accessory muscles of respiration.  CARDIOVASCULAR: S1, S2 normal. No murmurs, rubs, or gallops.  ABDOMEN: Soft, nontender, nondistended. Bowel sounds  present. No organomegaly or mass.  EXTREMITIES: No pedal edema, cyanosis, or clubbing.  NEUROLOGIC: Cranial nerves II through XII are intact. Muscle strength 4/5 in lower extremities. Sensation intact. Gait not checked.  PSYCHIATRIC: The patient is alert and oriented x 3.  SKIN: No obvious rash, lesion, or ulcer.    LABORATORY PANEL:   CBC  Recent Labs Lab 12/21/15 0502  WBC 7.6  HGB 11.3*  HCT 34.8*  PLT 200   ------------------------------------------------------------------------------------------------------------------  Chemistries   Recent Labs Lab 12/20/15 1650 12/21/15 0502  NA 144 143  K 4.0 4.0  CL 101 104  CO2 34* 33*  GLUCOSE 74 111*  BUN 17 15  CREATININE 0.80 0.69  CALCIUM 9.6 9.0  AST 29  --   ALT 24  --   ALKPHOS 145*  --   BILITOT 0.8  --    ------------------------------------------------------------------------------------------------------------------  Cardiac Enzymes No results for input(s): TROPONINI in the last 168 hours. ------------------------------------------------------------------------------------------------------------------  RADIOLOGY:  Dg Thoracic Spine 2 View  12/24/2015  CLINICAL DATA:  Kyphoplasty in OR at T10 and L1, breast cancer, thoracic spine metastatic disease EXAM: THORACIC SPINE 2 VIEWS ; DG C-ARM 61-120 MIN COMPARISON:  CT chest 12/20/2015 FLUOROSCOPY TIME:  3 minutes 7 seconds Submitted images:  4 FINDINGS: Diffuse osseous demineralization. Patient has had spinal augmentation procedures performed at what appear to be the T10 and L1 vertebra, assignment of spinal levels difficult due to lack of landmarks. Additional superior endplate compression fracture of what is likely the T12 vertebral body. No subluxation seen. IMPRESSION: Spinal augmentation procedures at what are likely T10 and L1 with additional anterior compression fracture identified at probable T12. Electronically Signed   By: Crist Infante.D.  On: 12/24/2015  16:53   Nm Bone Scan Whole Body  12/25/2015  CLINICAL DATA:  65 year old with current history of metastatic left breast cancer. Recent CTA chest demonstrating osseous metastatic disease throughout the thoracic spine with T10, T12 and L1 compression fractures. Post augmentation at T12 and L1 yesterday. Staging evaluation. EXAM: NUCLEAR MEDICINE WHOLE BODY BONE SCAN TECHNIQUE: Whole body anterior and posterior images were obtained approximately 3 hours after intravenous injection of radiopharmaceutical. RADIOPHARMACEUTICALS:  22.3 mCi Technetium-42m MDP IV COMPARISON:  No prior nuclear imaging. Bone window images from CTA chest and unenhanced CT abdomen and pelvis 12/20/2015 are correlated. FINDINGS: Multiple foci of abnormal osseous activity indicating metastatic disease including: Right scapula; anterior upper right ribs likely the 2nd and 4th; posterior right ribs likely the 8th and 10th; right posterior left ribs likely the 4th, 6th, 7th and 10th; essentially all of the thoracic vertebrae from T3 through T12 ; all of the lumbar vertebrae from L1 through L5; the medial distal left femoral metaphysis. Degenerative activity is present in the acromioclavicular joints, right greater than left; both knee joints, left greater than right; both ankles; both hindfeet and midfeet, left greater than right. Patient was incontinent of urine. Physiologic excretion of the radiopharmaceutical by the kidneys. IMPRESSION: Multiple osseous metastases involving the axial and appendicular skeleton as detailed above. Degenerative uptake in multiple joints as detailed above. Electronically Signed   By: Evangeline Dakin M.D.   On: 12/25/2015 14:18   Dg C-arm 61-120 Min  12/24/2015  CLINICAL DATA:  Kyphoplasty in OR at T10 and L1, breast cancer, thoracic spine metastatic disease EXAM: THORACIC SPINE 2 VIEWS ; DG C-ARM 61-120 MIN COMPARISON:  CT chest 12/20/2015 FLUOROSCOPY TIME:  3 minutes 7 seconds Submitted images:  4 FINDINGS:  Diffuse osseous demineralization. Patient has had spinal augmentation procedures performed at what appear to be the T10 and L1 vertebra, assignment of spinal levels difficult due to lack of landmarks. Additional superior endplate compression fracture of what is likely the T12 vertebral body. No subluxation seen. IMPRESSION: Spinal augmentation procedures at what are likely T10 and L1 with additional anterior compression fracture identified at probable T12. Electronically Signed   By: Lavonia Dana M.D.   On: 12/24/2015 16:53    EKG:   Orders placed or performed during the hospital encounter of 05/05/15  . ED EKG  . ED EKG  . EKG    ASSESSMENT AND PLAN:   #1. Acute T 10 and L1 compression fracture with resultant lower back pain, Continue high doses of oxycodone, Dilaudid prn. S/p Biopsy and kyphoplasty by Dr Rudene Christians.  #2. 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  Per Dr. Wynell Balloon, The patient should undergo a biopsy of the most accessible lesion. Per radiation oncologist, treat with 3000 cGy in 10 fractions I've ordered and scheduled CT simulation for today.  #3 anemia of unclear etiology at this time, likely anemia of chronic disease,  Stable. #4 hypertension, continue outpatient medications. #5. Hyperlipidemia, continue outpatient medications, #6. COPD, stable, ruled out pulmonary embolism per CTA.   * DM2. Continue Lantus and sliding scale. Hold metformin. * Tobacco abuse. Smoking cessation was counseled for 3 minutes. * Constipation. Improved. Lactulose when necessary. Continue Colace.  Discussed with Dr. Grayland Ormond. All the records are reviewed and case discussed with Care Management/Social Workerr. Management plans discussed with the patient, her daughter and they are in agreement.  CODE STATUS: Full code  TOTAL TIME TAKING CARE OF THIS PATIENT: 36  minutes.  Greater than 50% time was spent on coordination of care and face-to-face counseling.  POSSIBLE D/C IN 1 DAYS, DEPENDING ON CLINICAL CONDITION.   Demetrios Loll M.D on 12/25/2015 at 2:29 PM  Between 7am to 6pm - Pager - [phone]  After 6pm go to [email or web address] - password [email or web address] [facility]  Office  [phone]  CC: Primary care physician; Viviana Simpler, MD

## 2015-12-26 ENCOUNTER — Telehealth: Payer: Self-pay

## 2015-12-26 LAB — CBC WITH DIFFERENTIAL/PLATELET
BASOS ABS: 0 10*3/uL (ref 0–0.1)
BASOS PCT: 1 %
EOS ABS: 0.5 10*3/uL (ref 0–0.7)
Eosinophils Relative: 6 %
HCT: 33.4 % — ABNORMAL LOW (ref 35.0–47.0)
Hemoglobin: 10.8 g/dL — ABNORMAL LOW (ref 12.0–16.0)
Lymphocytes Relative: 21 %
Lymphs Abs: 1.6 10*3/uL (ref 1.0–3.6)
MCH: 26.4 pg (ref 26.0–34.0)
MCHC: 32.4 g/dL (ref 32.0–36.0)
MCV: 81.5 fL (ref 80.0–100.0)
MONO ABS: 1 10*3/uL — AB (ref 0.2–0.9)
MONOS PCT: 14 %
NEUTROS PCT: 58 %
Neutro Abs: 4.5 10*3/uL (ref 1.4–6.5)
Platelets: 177 10*3/uL (ref 150–440)
RBC: 4.1 MIL/uL (ref 3.80–5.20)
RDW: 15.8 % — AB (ref 11.5–14.5)
WBC: 7.6 10*3/uL (ref 3.6–11.0)

## 2015-12-26 LAB — BASIC METABOLIC PANEL
Anion gap: 10 (ref 5–15)
BUN: 24 mg/dL — ABNORMAL HIGH (ref 6–20)
CALCIUM: 8.1 mg/dL — AB (ref 8.9–10.3)
CO2: 29 mmol/L (ref 22–32)
CREATININE: 0.85 mg/dL (ref 0.44–1.00)
Chloride: 99 mmol/L — ABNORMAL LOW (ref 101–111)
GLUCOSE: 245 mg/dL — AB (ref 65–99)
Potassium: 3.2 mmol/L — ABNORMAL LOW (ref 3.5–5.1)
Sodium: 138 mmol/L (ref 135–145)

## 2015-12-26 LAB — GLUCOSE, CAPILLARY
GLUCOSE-CAPILLARY: 263 mg/dL — AB (ref 65–99)
GLUCOSE-CAPILLARY: 212 mg/dL — AB (ref 65–99)
Glucose-Capillary: 188 mg/dL — ABNORMAL HIGH (ref 65–99)
Glucose-Capillary: 238 mg/dL — ABNORMAL HIGH (ref 65–99)

## 2015-12-26 MED ORDER — HYDROMORPHONE HCL 1 MG/ML IJ SOLN: 1.0000 mg | INTRAMUSCULAR | Status: DC | PRN

## 2015-12-26 MED ORDER — ENOXAPARIN SODIUM 40 MG/0.4ML ~~LOC~~ SOLN
40.0000 mg | SUBCUTANEOUS | Status: DC
  Administered 2015-12-26: 40 mg via SUBCUTANEOUS
  Filled 2015-12-26: qty 0.4

## 2015-12-26 MED ORDER — INSULIN ASPART 100 UNIT/ML ~~LOC~~ SOLN
0.0000 [IU] | Freq: Every day | SUBCUTANEOUS | Status: DC
  Administered 2015-12-26: 22:00:00 2 [IU] via SUBCUTANEOUS
  Filled 2015-12-26: qty 2

## 2015-12-26 MED ORDER — INSULIN ASPART 100 UNIT/ML ~~LOC~~ SOLN
0.0000 [IU] | Freq: Three times a day (TID) | SUBCUTANEOUS | Status: DC
  Administered 2015-12-26: 3 [IU] via SUBCUTANEOUS
  Administered 2015-12-27: 12:00:00 5 [IU] via SUBCUTANEOUS
  Administered 2015-12-27: 09:00:00 2 [IU] via SUBCUTANEOUS
  Filled 2015-12-26: qty 2
  Filled 2015-12-26: qty 3
  Filled 2015-12-26: qty 5

## 2015-12-26 NOTE — Care Management (Signed)
Telephone call to Aleene Davidson, Patient Financial Specialist representative. States she will call Ms. Kassa's room. Discussed Home Health services in the home. Would like to resume services thru [facility] in [place]    [phone]).                                                                                                                                  Discharge to home tomorrow per Dr. Bridgett Larsson.  Shelbie Ammons RN MSN CCM Care Management [phone]

## 2015-12-26 NOTE — Progress Notes (Addendum)
Physical Therapy Treatment Patient Details Name: Madeline [name] MRN: [id number] DOB: May 21, 1951 Today's Date: 12/26/2015    History of Present Illness Pt admitted for complaints of back pain and is now s/p kyphoplasty at T10 and L1 level as well as biospy at T10. Pt with history of metastatic carcinoma, obesity, DM, neuropathy, MS, and COPD.  During this stay, pt also noted with multiple lesions in thoracic and lumbar spine that may be related to previous metastiatic breast cancer. Pt also wears chronic 2L of O2 at night. Of note, pt with recent femur fracture in 2016. Plan to start radiation on 12/27/15 for further treatment.    PT Comments    Pt is making good progress towards goals with increased ambulation distance this session. Pt with pain better controlled and pt able to ambulate to bathroom as well with supervision. Good endurance with there-ex this date. Pt continues to be motivated to perform therapy. Safe technique using rw.   Follow Up Recommendations  Home health PT;Supervision - Intermittent     Equipment Recommendations       Recommendations for Other Services       Precautions / Restrictions Precautions Precautions: Fall;Back Precaution Booklet Issued: No Precaution Comments: Pt educated on precations Restrictions Weight Bearing Restrictions: No    Mobility  Bed Mobility Overal bed mobility: Needs Assistance Bed Mobility: Supine to Sit;Sit to Supine     Supine to sit: Min guard Sit to supine: Supervision   General bed mobility comments: improved sequencing compared to earlier session. Pt demonstrates safe technique and only requires slight assistance for trunk mobility to get to EOB. Once seated at EOB, pt able to sit independently  Transfers Overall transfer level: Needs assistance Equipment used: Rolling walker (2 wheeled) Transfers: Sit to/from Stand Sit to Stand: Min guard         General transfer comment: Forward/backward momentum needed to  perform transfer this session with rw. Once standing, pt able to stand with upright posture  Ambulation/Gait Ambulation/Gait assistance: Supervision Ambulation Distance (Feet): 80 Feet Assistive device: Rolling walker (2 wheeled) Gait Pattern/deviations: Step-through pattern     General Gait Details: 2 bouts of 40' performed with rw. Pt performed all mobility while on room air with sats WNL. Reciprocal gait pattern performed with good speed. Safe technique noted during turns. Pt fatigues with increased distance   Stairs            Wheelchair Mobility    Modified Rankin (Stroke Patients Only)       Balance                                    Cognition Arousal/Alertness: Awake/alert Behavior During Therapy: WFL for tasks assessed/performed Overall Cognitive Status: Within Functional Limits for tasks assessed                      Exercises Other Exercises Other Exercises: B LE ther-ex performed including 12 reps of ankle pumps, quad sets, SLR with 30sec hold, hip abd/add, LAQ, and hip add squeezes. All ther-ex performed with supervision Other Exercises: ambulated to [facility], needed cga for set up and only supervision for hygiene.    General Comments        Pertinent Vitals/Pain Pain Assessment: 0-10 Pain Score: 5  Pain Location: lower back Pain Descriptors / Indicators: Operative site guarding Pain Intervention(s): Limited activity within patient's tolerance    Home Living  Prior Function            PT Goals (current goals can now be found in the care plan section) Acute Rehab PT Goals Patient Stated Goal: to get stronger PT Goal Formulation: With patient Time For Goal Achievement: 01/08/16 Potential to Achieve Goals: Good Progress towards PT goals: Progressing toward goals    Frequency  BID    PT Plan Current plan remains appropriate    Co-evaluation             End of Session Equipment  Utilized During Treatment: Gait belt Activity Tolerance: Patient tolerated treatment well Patient left: in bed;with bed alarm set     Time: 1610-9604 PT Time Calculation (min) (ACUTE ONLY): 24 min  Charges:  $Gait Training: 8-22 mins $Therapeutic Exercise: 8-22 mins $Therapeutic Activity: 8-22 mins                    G Codes:      Madeline [name] 01-22-2016, 4:59 PM Madeline [name], PT, DPT [phone]

## 2015-12-26 NOTE — Telephone Encounter (Signed)
Patient informed no medication changes will be made outside of appt

## 2015-12-26 NOTE — Telephone Encounter (Signed)
Nurses Informed patient that I cannot change in the medications until patient comes for evaluation Patient is to discuss her medications with the physician who prescribed Dilaudid and  is presently treating her and is adjusting her medications

## 2015-12-26 NOTE — Progress Notes (Signed)
Inpatient Diabetes Program Recommendations  AACE/ADA: New Consensus Statement on Inpatient Glycemic Control (2015)  Target Ranges:  Prepandial:   less than 140 mg/dL      Peak postprandial:   less than 180 mg/dL (1-2 hours)      Critically ill patients:  140 - 180 mg/dL  Results for Madeline [name], Madeline [name] (MRN [id number]) as of 12/26/2015 16:04  Ref. Range 12/25/2015 07:29 12/25/2015 11:24 12/25/2015 16:26 12/25/2015 20:36 12/26/2015 07:51 12/26/2015 11:29  Glucose-Capillary Latest Ref Range: 65-99 mg/dL 142 (H) 217 (H) 138 (H) 190 (H) 188 (H) 238 (H)   Review of Glycemic Control  Diabetes history: DM2 Outpatient Diabetes medications: Lantus 47 units BID, Humalog 0-6 units TID with meals (sliding scale; only takes insulin if CBG > 150 mg/dl), Metformin 1000 mg BID Current orders for Inpatient glycemic control: Lantus 47 units BID, Novolog 0-20 units TID with meals, Novolog 0-5 units QHS  Inpatient Diabetes Program Recommendations: Correction (SSI): Please consider decreasing Novolog correction to the sensitive scale.   NOTE: Paged by Jinny Blossom, RN regarding patient refusing to take ordered dose of Novolog correction. Noted in reviewing the chart, that patient is taking some Novolog correction but much less than what is ordered.  Called patient and talked with her over the phone regarding glycemic control, outpatient DM regimen, and inpatient DM regimen currently ordered. Patient reports that she takes: Lantus 47 units BID, Humalog 0-6 units TID with meals (sliding scale; only takes insulin if CBG > 150 mg/dl), Metformin 1000 mg BID as an outpatient. Patient is concerned about the dose of Novolog insulin that is ordered. Patient states that she thinks she is ordered too much Novolog correction. Discussed her outpatient correction scale as well as her inpatient correction scale. Discussed impact of nutrition, exercise, stress, sickness, and medications on diabetes control. Patient's A1C was 7.1% on 12/20/15  indicating very good glycemic control over the past 2-3 months. Patient states that her glucose generally runs in the 100's mg/dl at home. Patient has experienced several lows in the past and at one point she was having at least 3 hypoglycemia episodes per week. Patient is afraid she will have hypoglycemia as an inpatient if she takes the ordered amount of insulin per the Resistant correction scale. Provided emotional support and asked patient if she would be willing to take the ordered dose of insulin if the scale was decreased and patient is willing to take the Novolog correction that is more comparable to her scale she uses at home. Informed patient I would request MD decrease to the sensitive scale and that the diabetes coordinator would follow up tomorrow and if CBGs are still elevated we may ask to increase to the moderate scale. Patient verbalized understanding of information discussed and she states that she has no further questions at this time related to diabetes.  Thanks, Barnie Alderman, RN, MSN, CDE Diabetes Coordinator Inpatient Diabetes Program [phone] (Team Pager from [place] to [place]) [phone] ([facility] office) [phone] [facility] office) [phone] [facility] office)

## 2015-12-26 NOTE — Telephone Encounter (Signed)
Pt is in the hospital with lung cancer wants to talk with Dr. Primus Bravo about her pain meds. Pt is in room 110 she would like for someone to contact her because he is going home tomorrow.

## 2015-12-26 NOTE — Progress Notes (Signed)
Physical Therapy Treatment Patient Details Name: Madeline [name] MRN: [id number] DOB: June 23, 1951 Today's Date: 12/26/2015    History of Present Illness Pt admitted for complaints of back pain and is now s/p kyphoplasty at T10 and L1 level as well as biospy at T10. Pt with history of metastatic carcinoma, obesity, DM, neuropathy, MS, and COPD.  During this stay, pt also noted with multiple lesions in thoracic and lumbar spine that may be related to previous metastiatic breast cancer. Pt also wears chronic 2L of O2 at night. Of note, pt with recent femur fracture in 2016.     PT Comments    Pt is making good progress towards goals, however is limited in mobility this date secondary to pain. Good endurance with HEP and pt is motivated to perform therapy. Will continue efforts in PM to increase mobility.  Follow Up Recommendations  Home health PT;Supervision - Intermittent     Equipment Recommendations       Recommendations for Other Services       Precautions / Restrictions Precautions Precautions: Fall;Back Precaution Booklet Issued: No Precaution Comments: Pt educated on precations Restrictions Weight Bearing Restrictions: No    Mobility  Bed Mobility Overal bed mobility: Needs Assistance Bed Mobility: Supine to Sit;Sit to Supine     Supine to sit: Mod assist Sit to supine: Min assist   General bed mobility comments: assist for trunk mobility. Once seated at EOB, pt able to sit with safe technique, however complains of pain. +2 needed for transition back into bed and scoot towards [facility]  Transfers Overall transfer level: Needs assistance Equipment used: Rolling walker (2 wheeled) Transfers: Sit to/from Stand Sit to Stand: Min guard         General transfer comment: safe technique performed with transfers using rw. Pt complains of increased pain with standing. Unable to further perform mobility at this time.  Ambulation/Gait             General Gait Details:  not performed   Stairs            Wheelchair Mobility    Modified Rankin (Stroke Patients Only)       Balance                                    Cognition Arousal/Alertness: Awake/alert Behavior During Therapy: WFL for tasks assessed/performed Overall Cognitive Status: Within Functional Limits for tasks assessed                      Exercises Other Exercises Other Exercises: B LE ther-ex performed x 10 reps including ankle pumps, quad sets, SLRs, knee flexion and hip abd/add with supervision.    General Comments        Pertinent Vitals/Pain Pain Assessment: 0-10 Pain Score: 7  Pain Location: lower back Pain Descriptors / Indicators: Operative site guarding Pain Intervention(s): Limited activity within patient's tolerance;Premedicated before session    Home Living                      Prior Function            PT Goals (current goals can now be found in the care plan section) Acute Rehab PT Goals Patient Stated Goal: to get stronger PT Goal Formulation: With patient Time For Goal Achievement: 01/08/16 Potential to Achieve Goals: Good Progress towards PT goals: Progressing toward goals  Frequency  BID    PT Plan Current plan remains appropriate    Co-evaluation             End of Session Equipment Utilized During Treatment: Oxygen;Gait belt Activity Tolerance: Patient limited by pain Patient left: in bed;with bed alarm set     Time: 1093-2355 PT Time Calculation (min) (ACUTE ONLY): 26 min  Charges:  $Therapeutic Exercise: 8-22 mins $Therapeutic Activity: 8-22 mins                    G Codes:      Carmelo Reidel 01/01/2016, 1:21 PM [name], PT, DPT [phone]

## 2015-12-26 NOTE — Progress Notes (Signed)
[facility] [facility] [place] NAME: Madeline [name]    MR#:  [id number]  DATE OF BIRTH:  02-Feb-1951  SUBJECTIVE:  CHIEF COMPLAINT:   Chief Complaint  Patient presents with  . Back Pain  worsening back pain today.  REVIEW OF SYSTEMS:  CONSTITUTIONAL: No fever, no weakness.  EYES: No blurred or double vision.  EARS, NOSE, AND THROAT: No tinnitus or ear pain.  RESPIRATORY: No cough, shortness of breath, wheezing or hemoptysis.  CARDIOVASCULAR: No chest pain, orthopnea, edema.  GASTROINTESTINAL: No nausea, vomiting, diarrhea or abdominal pain. No Constipation. GENITOURINARY: No dysuria, hematuria.  ENDOCRINE: No polyuria, nocturia,  HEMATOLOGY: No anemia, easy bruising or bleeding SKIN: No rash or lesion. MUSCULOSKELETAL: back pain.   NEUROLOGIC: No tingling, numbness, weakness.  PSYCHIATRY: No anxiety or depression.   DRUG ALLERGIES:   Allergies  Allergen Reactions  . Penicillins Swelling    tongue and throat swelling tolerates cephalosporins  . Sulfa Antibiotics Hives and Itching  . Trovan [Alatrofloxacin] Hives  . Rosiglitazone Maleate Swelling    VITALS:  Blood pressure 118/51, pulse 74, temperature 98.2 F (36.8 C), temperature source Oral, resp. rate 18, height 5\' 2"  (1.575 m), weight 122.471 kg (270 lb), SpO2 96 %.  PHYSICAL EXAMINATION:  GENERAL:  65 y.o.-year-old patient lying in the bed with no acute distress. Morbid obesity. EYES: Pupils equal, round, reactive to light and accommodation. No scleral icterus. Extraocular muscles intact.  HEENT: Head atraumatic, normocephalic. Oropharynx and nasopharynx clear.  NECK:  Supple, no jugular venous distention. No thyroid enlargement, no tenderness.  LUNGS: Normal breath sounds bilaterally, no wheezing, rales,rhonchi or crepitation. No use of accessory muscles of respiration.  CARDIOVASCULAR: S1, S2 normal. No murmurs, rubs, or gallops.  ABDOMEN: Soft, nontender, nondistended.  Bowel sounds present. No organomegaly or mass.  EXTREMITIES: No pedal edema, cyanosis, or clubbing.  NEUROLOGIC: Cranial nerves II through XII are intact. Muscle strength 4/5 in lower extremities. Sensation intact. Gait not checked.  PSYCHIATRIC: The patient is alert and oriented x 3.  SKIN: No obvious rash, lesion, or ulcer.    LABORATORY PANEL:   CBC  Recent Labs Lab 12/21/15 0502  WBC 7.6  HGB 11.3*  HCT 34.8*  PLT 200   ------------------------------------------------------------------------------------------------------------------  Chemistries   Recent Labs Lab 12/20/15 1650 12/21/15 0502  NA 144 143  K 4.0 4.0  CL 101 104  CO2 34* 33*  GLUCOSE 74 111*  BUN 17 15  CREATININE 0.80 0.69  CALCIUM 9.6 9.0  AST 29  --   ALT 24  --   ALKPHOS 145*  --   BILITOT 0.8  --    ------------------------------------------------------------------------------------------------------------------  Cardiac Enzymes No results for input(s): TROPONINI in the last 168 hours. ------------------------------------------------------------------------------------------------------------------  RADIOLOGY:  Dg Thoracic Spine 2 View  12/24/2015  CLINICAL DATA:  Kyphoplasty in OR at T10 and L1, breast cancer, thoracic spine metastatic disease EXAM: THORACIC SPINE 2 VIEWS ; DG C-ARM 61-120 MIN COMPARISON:  CT chest 12/20/2015 FLUOROSCOPY TIME:  3 minutes 7 seconds Submitted images:  4 FINDINGS: Diffuse osseous demineralization. Patient has had spinal augmentation procedures performed at what appear to be the T10 and L1 vertebra, assignment of spinal levels difficult due to lack of landmarks. Additional superior endplate compression fracture of what is likely the T12 vertebral body. No subluxation seen. IMPRESSION: Spinal augmentation procedures at what are likely T10 and L1 with additional anterior compression fracture identified at probable T12. Electronically Signed   By: Lavonia Dana  M.D.   On:  12/24/2015 16:53   Nm Bone Scan Whole Body  12/25/2015  CLINICAL DATA:  65 year old with current history of metastatic left breast cancer. Recent CTA chest demonstrating osseous metastatic disease throughout the thoracic spine with T10, T12 and L1 compression fractures. Post augmentation at T12 and L1 yesterday. Staging evaluation. EXAM: NUCLEAR MEDICINE WHOLE BODY BONE SCAN TECHNIQUE: Whole body anterior and posterior images were obtained approximately 3 hours after intravenous injection of radiopharmaceutical. RADIOPHARMACEUTICALS:  22.3 mCi Technetium-35m MDP IV COMPARISON:  No prior nuclear imaging. Bone window images from CTA chest and unenhanced CT abdomen and pelvis 12/20/2015 are correlated. FINDINGS: Multiple foci of abnormal osseous activity indicating metastatic disease including: Right scapula; anterior upper right ribs likely the 2nd and 4th; posterior right ribs likely the 8th and 10th; right posterior left ribs likely the 4th, 6th, 7th and 10th; essentially all of the thoracic vertebrae from T3 through T12 ; all of the lumbar vertebrae from L1 through L5; the medial distal left femoral metaphysis. Degenerative activity is present in the acromioclavicular joints, right greater than left; both knee joints, left greater than right; both ankles; both hindfeet and midfeet, left greater than right. Patient was incontinent of urine. Physiologic excretion of the radiopharmaceutical by the kidneys. IMPRESSION: Multiple osseous metastases involving the axial and appendicular skeleton as detailed above. Degenerative uptake in multiple joints as detailed above. Electronically Signed   By: Evangeline Dakin M.D.   On: 12/25/2015 14:18   Dg C-arm 61-120 Min  12/24/2015  CLINICAL DATA:  Kyphoplasty in OR at T10 and L1, breast cancer, thoracic spine metastatic disease EXAM: THORACIC SPINE 2 VIEWS ; DG C-ARM 61-120 MIN COMPARISON:  CT chest 12/20/2015 FLUOROSCOPY TIME:  3 minutes 7 seconds Submitted images:  4  FINDINGS: Diffuse osseous demineralization. Patient has had spinal augmentation procedures performed at what appear to be the T10 and L1 vertebra, assignment of spinal levels difficult due to lack of landmarks. Additional superior endplate compression fracture of what is likely the T12 vertebral body. No subluxation seen. IMPRESSION: Spinal augmentation procedures at what are likely T10 and L1 with additional anterior compression fracture identified at probable T12. Electronically Signed   By: Lavonia Dana M.D.   On: 12/24/2015 16:53    EKG:   Orders placed or performed during the hospital encounter of 05/05/15  . ED EKG  . ED EKG  . EKG    ASSESSMENT AND PLAN:   #1. 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 Continue oxycodone, Dilaudid prn. S/p Biopsy and kyphoplasty by Dr Rudene Christians.  #2. 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  Per Dr. Wynell Balloon, The patient should undergo a biopsy of the most accessible lesion. Per radiation oncologist, treat with 3000 cGy in 10 fractions.  #3 anemia of unclear etiology at this time, likely anemia of chronic disease,  Stable. #4 hypertension, continue outpatient medications. #5. Hyperlipidemia, continue outpatient medications, #6. COPD, stable, ruled out pulmonary embolism per CTA.   * DM2. Continue Lantus and sliding scale. Hold metformin. * Tobacco abuse. Smoking cessation was counseled for 3 minutes. * Constipation. Improved. Lactulose when necessary. Continue Colace.  Discussed with Dr Rudene Christians. All the records are reviewed and case discussed with Care Management/Social Workerr. Management plans discussed with the patient, her daughter and they are in agreement.  CODE STATUS: Full code  TOTAL TIME TAKING CARE OF THIS PATIENT: 36 minutes.   Greater than 50% time was spent on coordination of care and face-to-face counseling.  POSSIBLE D/C IN 1 DAYS, DEPENDING ON CLINICAL CONDITION.   Demetrios Loll M.D on 12/26/2015 at 12:59 PM  Between 7am to 6pm - Pager - [phone]  After 6pm go to [email or web address] - password [email or web address] [facility]  Office  [phone]  CC: Primary care physician; Viviana Simpler, MD

## 2015-12-26 NOTE — Telephone Encounter (Signed)
Has been on Dilaudid every 4 hours while in hospital. Also has increased Oxycodone 10 mg, every 4 hours. Wants Dr. Primus Bravo to increase Oxycodone to 10 mg for when she is discharged tomorrow. Has appt next week.

## 2015-12-26 NOTE — Care Management Important Message (Signed)
Important Message  Patient Details  Name: ANGLINE PRESTIDGE MRN: [id number] Date of Birth: 1951/03/13   Medicare Important Message Given:  Yes    Juliann Pulse A Escher Harr 12/26/2015, 9:11 AM

## 2015-12-27 ENCOUNTER — Telehealth: Payer: Self-pay | Admitting: Pain Medicine

## 2015-12-27 ENCOUNTER — Ambulatory Visit
Admit: 2015-12-27 | Discharge: 2015-12-27 | Disposition: A | Discharge status: Home / Self Care | Payer: Medicare Other | Attending: Radiation Oncology | Admitting: Radiation Oncology

## 2015-12-27 DIAGNOSIS — C7951 Secondary malignant neoplasm of bone: Secondary | ICD-10-CM | POA: Diagnosis not present

## 2015-12-27 LAB — GLUCOSE, CAPILLARY
GLUCOSE-CAPILLARY: 275 mg/dL — AB (ref 65–99)
Glucose-Capillary: 199 mg/dL — ABNORMAL HIGH (ref 65–99)

## 2015-12-27 LAB — CREATININE, SERUM
Creatinine, Ser: 0.83 mg/dL (ref 0.44–1.00)
GFR calc Af Amer: >60 mL/min (ref >60)

## 2015-12-27 MED ORDER — BISACODYL 5 MG PO TBEC: 5.0000 mg | DELAYED_RELEASE_TABLET | Freq: Every day | ORAL | Status: DC | PRN

## 2015-12-27 MED ORDER — POTASSIUM CHLORIDE CRYS ER 20 MEQ PO TBCR
40.0000 meq | EXTENDED_RELEASE_TABLET | Freq: Once | ORAL | Status: AC
  Administered 2015-12-27: 40 meq via ORAL
  Filled 2015-12-27: qty 2

## 2015-12-27 MED ORDER — OXYCODONE HCL 10 MG PO TABS: 10.0000 mg | ORAL_TABLET | Freq: Four times a day (QID) | ORAL | Status: DC | PRN

## 2015-12-27 MED ORDER — DOCUSATE SODIUM 100 MG PO CAPS: 100.0000 mg | ORAL_CAPSULE | Freq: Two times a day (BID) | ORAL | Status: DC

## 2015-12-27 NOTE — Progress Notes (Signed)
Physical Therapy Treatment Patient Details Name: Madeline [name] MRN: [id number] DOB: [birth year] Today's Date: 12/27/2015    History of Present Illness Pt admitted for complaints of back pain and is now s/p kyphoplasty at T10 and L1 level as well as biospy at T10. Pt with history of metastatic carcinoma, obesity, DM, neuropathy, MS, and COPD.  During this stay, pt also noted with multiple lesions in thoracic and lumbar spine that may be related to previous metastiatic breast cancer. Pt also wears chronic 2L of O2 at night. Of note, pt with recent femur fracture in 2016. Plan to start radiation on 12/27/15 for further treatment.    PT Comments    Pt continues to have significant complains of pain with all functional mobility and exercises. Pt reports 6/10 pain at rest; 8/10 pain with treatment session. Pt also experienced heightened pain with cough; pt educated on bracing abdominal with arm/pillow for cough. Daughter concerned about taking pt home with this level of pain. Nurse contacted to discuss current pain; nursing notes patient is ready for pain medication and will address with patient/family. Pt participates in seated exercises and minimal ambulation with complaints of pain; fatigues quickly and does not feel she can do any further activity/exercises at this time. Pt returned to bed; performs bed mobility with assist and assist from bed positioning as well. Pt currently has discharge orders; will check on pt this afternoon if pt continues admitted status at that time to further address/progress strength, endurance and functional mobility.   Follow Up Recommendations  Home health PT;Supervision - Intermittent     Equipment Recommendations       Recommendations for Other Services       Precautions / Restrictions Precautions Precautions: Fall;Back Restrictions Weight Bearing Restrictions: No    Mobility  Bed Mobility Overal bed mobility: Needs Assistance Bed Mobility: Supine to  Sit;Sit to Supine     Supine to sit: Min assist Sit to supine: Min assist   General bed mobility comments: Min A for trunk with sit and LEs with return to supine with foot of bed declined. Foot of bed inclined to assist scooting up in bed.   Transfers Overall transfer level: Needs assistance Equipment used: Rolling walker (2 wheeled) Transfers: Sit to/from Stand Sit to Stand: Min assist         General transfer comment: Several attempts with rocking motion to assist standing. Moans with pain. Cues for safe hand placement  Ambulation/Gait Ambulation/Gait assistance: Min guard Ambulation Distance (Feet): 40 Feet Assistive device: Rolling walker (2 wheeled) Gait Pattern/deviations: Step-to pattern;Trunk flexed Gait velocity: decreased Gait velocity interpretation: Below normal speed for age/gender General Gait Details: Increased pain with ambulation. Fatigues as well.    Stairs            Wheelchair Mobility    Modified Rankin (Stroke Patients Only)       Balance Overall balance assessment: Needs assistance Sitting-balance support: Feet supported;No upper extremity supported Sitting balance-Leahy Scale: Good     Standing balance support: Bilateral upper extremity supported Standing balance-Leahy Scale: Fair                      Cognition Arousal/Alertness: Awake/alert Behavior During Therapy: WFL for tasks assessed/performed Overall Cognitive Status: Within Functional Limits for tasks assessed                      Exercises General Exercises - Lower Extremity Long Arc Quad: AROM;Both;20 reps;Seated Hip Flexion/Marching: AROM;Both;20  reps;Seated Toe Raises: AROM;Both;20 reps;Seated Heel Raises: AROM;Both;20 reps;Seated Other Exercises Other Exercises: Pt educated on abdominal bracing with cough, as pt c/o significant pain with cough    General Comments        Pertinent Vitals/Pain Pain Assessment: 0-10 Pain Score: 6  (increases to  8 with transfer/ambulation/exercise) Pain Location: Mid to low back Pain Descriptors / Indicators: Sharp;Aching;Constant Pain Intervention(s): Limited activity within patient's tolerance;Monitored during session;Patient requesting pain meds-RN notified    Home Living                      Prior Function            PT Goals (current goals can now be found in the care plan section) Progress towards PT goals: Progressing toward goals (slowly)    Frequency  BID    PT Plan Current plan remains appropriate    Co-evaluation             End of Session Equipment Utilized During Treatment: Gait belt Activity Tolerance: Patient limited by pain Patient left: in bed;with call bell/phone within reach;with bed alarm set;with family/visitor present     Time: [id number] PT Time Calculation (min) (ACUTE ONLY): 23 min  Charges:  $Gait Training: 8-22 mins $Therapeutic Exercise: 8-22 mins                    G Codes:      Charlaine Dalton 12/27/2015, 10:51 AM

## 2015-12-27 NOTE — Telephone Encounter (Signed)
Thank you Nurses please document patient having informed us of receiving 20 oxycodone

## 2015-12-27 NOTE — Telephone Encounter (Signed)
Got script for oxycodone from dr Bridgett Larsson qty of 20 has appt with Dr. Primus Bravo on Tue.

## 2015-12-27 NOTE — Anesthesia Postprocedure Evaluation (Signed)
Anesthesia Post Note  Patient: EVEA STANCO  Procedure(s) Performed: Procedure(s) (LRB): KYPHOPLASTY (N/A)  Patient location during evaluation: PACU Anesthesia Type: General Level of consciousness: awake and alert Pain management: pain level controlled Vital Signs Assessment: post-procedure vital signs reviewed and stable Respiratory status: spontaneous breathing, nonlabored ventilation, respiratory function stable and patient connected to nasal cannula oxygen Cardiovascular status: blood pressure returned to baseline and stable Postop Assessment: no signs of nausea or vomiting Anesthetic complications: no    Last Vitals:  Filed Vitals:   12/26/15 2049 12/27/15 0518  BP: 122/49 138/54  Pulse: 71 64  Temp: 36.6 C 36.8 C  Resp: 18 18    Last Pain:  Filed Vitals:   12/27/15 [id number]  PainSc: [place]

## 2015-12-27 NOTE — Telephone Encounter (Signed)
Dr. Primus Bravo Please be aware that patient called and informed us of this.

## 2015-12-27 NOTE — Care Management (Signed)
Discharge to home today per Dr. Bridgett Larsson. Will fax Home Health, Physical therapy notes, History & Physical, and face sheet to [facility] in [place] [phone]). Family will transport. Shelbie Ammons RN MSN CCM Care Management [phone]

## 2015-12-27 NOTE — Discharge Summary (Signed)
[facility] NAME: Madeline [name]    MR#:  [id number]  DATE OF BIRTH:  07-30-1951  DATE OF ADMISSION:  12/20/2015 ADMITTING PHYSICIAN: Theodoro Grist, MD  DATE OF DISCHARGE: 12/27/2015  3:16 PM  PRIMARY CARE PHYSICIAN: Viviana Simpler, MD    ADMISSION DIAGNOSIS:  Swelling [R60.9] Back pain [M54.9] Metastatic cancer ([place]) [C80.1] Dyspnea [R06.00] Compression fracture [T14.8]   DISCHARGE DIAGNOSIS:   Acute T 10 and L1 compression fracture with resultant lower back pain, Due to Metastatic carcinoma due to breast cancer. 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  SECONDARY DIAGNOSIS:   Past Medical History  Diagnosis Date  . Depression   . Goiter, nodular     multi  . Dyslipidemia   . NIDDM, uncontrolled, with neuropathy   . Hypertension   . Allergy   . GERD (gastroesophageal reflux disease)   . Urinary incontinence   . Peripheral neuropathy ([place])   . Multiple sclerosis ([place])   . Breast cancer ([place])   . Hypothyroidism   . Asthma   . COPD (chronic obstructive pulmonary disease) ([place])   . DVT (deep venous thrombosis) ([facility]) 1990's    right leg  . Gout   . Femur fracture ([place])   . Frequent UTI     HOSPITAL COURSE:   #1. Acute T 10 and L1 compression fracture with resultant lower back pain, Due to Metastatic carcinoma due to breast cancer. Treated with high dose of oxycodone, Dilaudid prn. S/p Biopsy and kyphoplasty by Dr Rudene Christians.  #2. 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  Per Dr. Wynell Balloon, The patient should undergo a biopsy of the most accessible lesion. Per radiation oncologist, treat with 3000 cGy in 10 fractions.  #3 anemia of unclear etiology at this time, likely anemia of chronic disease, Stable. #4 hypertension, continue outpatient medications. #5. Hyperlipidemia,  continue outpatient medications, #6. COPD, stable, ruled out pulmonary embolism per CTA.   * DM2. Continue Lantus and sliding scale. Hold metformin. * Tobacco abuse. Smoking cessation was counseled for 3 minutes. * Constipation. Improved. Lactulose when necessary. Continue Colace.  DISCHARGE CONDITIONS:   Stable, discharged to home with HHPT.  CONSULTS OBTAINED:  Treatment Team:  Lloyd Huger, MD  DRUG ALLERGIES:   Allergies  Allergen Reactions  . Penicillins Swelling    tongue and throat swelling tolerates cephalosporins  . Sulfa Antibiotics Hives and Itching  . Trovan [Alatrofloxacin] Hives  . Rosiglitazone Maleate Swelling    DISCHARGE MEDICATIONS:   Discharge Medication List as of 12/27/2015  1:12 PM    START taking these medications   Details  bisacodyl (DULCOLAX) 5 MG EC tablet Take 1 tablet (5 mg total) by mouth daily as needed for moderate constipation., Starting 12/27/2015, Until Discontinued, Print    docusate sodium (COLACE) 100 MG capsule Take 1 capsule (100 mg total) by mouth 2 (two) times daily., Starting 12/27/2015, Until Discontinued, Print      CONTINUE these medications which have CHANGED   Details  oxyCODONE 10 MG TABS Take 1 tablet (10 mg total) by mouth every 6 (six) hours as needed for moderate pain., Starting 12/27/2015, Until Discontinued, Print      CONTINUE these medications which have NOT CHANGED   Details  allopurinol (ZYLOPRIM) 300 MG tablet Take 1 tablet (300 mg total) by mouth daily., Starting 03/22/2015, Until Discontinued, Normal  aspirin 325 MG tablet Take 325 mg by mouth daily.  , Until Discontinued, Historical Med    baclofen (LIORESAL) 10 MG tablet Limit 1 tablet by mouth twice a day to 3 times a day if tolerated, Normal    budesonide-formoterol (SYMBICORT) 160-4.5 MCG/ACT inhaler Inhale 1 puff into the lungs 2 (two) times daily., Starting 05/22/2015, Until Discontinued, Normal    Calcium Carbonate-Vitamin D (CALCIUM-VITAMIN  D) 600-200 MG-UNIT CAPS Take by mouth 2 (two) times daily. , Until Discontinued, Historical Med    carvedilol (COREG) 25 MG tablet Take 1 tablet (25 mg total) by mouth 2 (two) times daily with a meal., Starting 04/18/2015, Until Discontinued, Normal    Cholecalciferol (VITAMIN D3) 1000 UNITS CAPS Take 4,000 capsules by mouth every morning., Until Discontinued, Historical Med    colchicine 0.6 MG tablet TAKE ONE TABLET TWICE DAILY AS NEEDED, Normal    diazepam (VALIUM) 5 MG tablet TAKE ONE (1) TABLET BY MOUTH TWO (2) TIMES DAILY, Phone In    fish oil-omega-3 fatty acids 1000 MG capsule Take 2 g by mouth daily.  , Until Discontinued, Historical Med    FLUoxetine (PROZAC) 20 MG capsule TAKE 2 CAPSULES BY MOUTH EVERY DAY, Normal    furosemide (LASIX) 80 MG tablet Take 1 tablet (80 mg total) by mouth daily. Take second dose around lunchtime if your legs are swelling, Starting 01/12/2015, Until Discontinued, Normal    gabapentin (NEURONTIN) 300 MG capsule Limit 1-2 tablets by mouth 2-4 times per day if tolerated, Normal    glucose blood (ONE TOUCH TEST STRIPS) test strip Use as instructed to test blood sugar 3 times daily dx: E11.40, Normal    insulin lispro (HUMALOG) 100 UNIT/ML KiwkPen Use according to sliding scale as directed, Normal    Insulin Pen Needle 30G X 5 MM MISC Use as instructed to inject insulin 2-3 times daily dx 250.60, Normal    LANTUS SOLOSTAR 100 UNIT/ML Solostar Pen Inject 47 Units into the skin 2 (two) times daily. , Starting 03/27/2015, Until Discontinued, Historical Med    levothyroxine (SYNTHROID, LEVOTHROID) 175 MCG tablet Take 1 tablet (175 mcg total) by mouth daily., Starting 04/18/2015, Until Discontinued, Normal    meloxicam (MOBIC) 15 MG tablet Take 1 tablet (15 mg total) by mouth daily as needed., Starting 09/18/2015, Until Discontinued, Normal    metFORMIN (GLUCOPHAGE) 1000 MG tablet Take 1 tablet (1,000 mg total) by mouth daily with breakfast., Starting 09/18/2015,  Until Discontinued, Normal    omeprazole (PRILOSEC) 20 MG capsule TAKE ONE CAPSULE BY MOUTH TWICE DAILY, Normal    ONETOUCH DELICA LANCETS 99991111 MISC Use to test blood sugar three times daily dx: E11.40, Normal    potassium chloride (KLOR-CON M10) 10 MEQ tablet TAKE 2 TABLETS BY MOUTH EVERY DAY, Normal    tiotropium (SPIRIVA HANDIHALER) 18 MCG inhalation capsule PLACE 1 CAPSULE (18 MCG TOTAL) INTO INHALER AND INHALE DAILY AS NEEDED., Normal    traZODone (DESYREL) 100 MG tablet Take 2 tablets (200 mg total) by mouth at bedtime., Starting 09/18/2015, Until Discontinued, Normal    albuterol (PROVENTIL) (2.5 MG/3ML) 0.083% nebulizer solution Take 3 mLs (2.5 mg total) by nebulization every 6 (six) hours as needed for wheezing or shortness of breath., Starting 02/20/2014, Until Discontinued, Normal    COPAXONE 40 MG/ML SOSY Inject 40 mg into the skin 3 (three) times a week., Starting 12/25/2015, Until Discontinued, Normal      STOP taking these medications     ciprofloxacin (CIPRO) 250 MG tablet  DISCHARGE INSTRUCTIONS:    If you experience worsening of your admission symptoms, develop shortness of breath, life threatening emergency, suicidal or homicidal thoughts you must seek medical attention immediately by calling 911 or calling your MD immediately  if symptoms less severe.  You Must read complete instructions/literature along with all the possible adverse reactions/side effects for all the Medicines you take and that have been prescribed to you. Take any new Medicines after you have completely understood and accept all the possible adverse reactions/side effects.   Please note  You were cared for by a hospitalist during your hospital stay. If you have any questions about your discharge medications or the care you received while you were in the hospital after you are discharged, you can call the unit and asked to speak with the hospitalist on call if the hospitalist that took care  of you is not available. Once you are discharged, your primary care physician will handle any further medical issues. Please note that NO REFILLS for any discharge medications will be authorized once you are discharged, as it is imperative that you return to your primary care physician (or establish a relationship with a primary care physician if you do not have one) for your aftercare needs so that they can reassess your need for medications and monitor your lab values.    Today   SUBJECTIVE   Better back pain.   VITAL SIGNS:  Blood pressure 138/54, pulse 68, temperature 98.2 F (36.8 C), temperature source Oral, resp. rate 18, height 5\' 2"  (1.575 m), weight 122.471 kg (270 lb), SpO2 92 %.  I/O:   Intake/Output Summary (Last 24 hours) at 12/27/15 1826 Last data filed at 12/27/15 1300  Gross per 24 hour  Intake    240 ml  Output    800 ml  Net   -560 ml    PHYSICAL EXAMINATION:  GENERAL:  65 y.o.-year-old patient lying in the bed with no acute distress. Obese. EYES: Pupils equal, round, reactive to light and accommodation. No scleral icterus. Extraocular muscles intact.  HEENT: Head atraumatic, normocephalic. Oropharynx and nasopharynx clear.  NECK:  Supple, no jugular venous distention. No thyroid enlargement, no tenderness.  LUNGS: Normal breath sounds bilaterally, no wheezing, rales,rhonchi or crepitation. No use of accessory muscles of respiration.  CARDIOVASCULAR: S1, S2 normal. No murmurs, rubs, or gallops.  ABDOMEN: Soft, non-tender, non-distended. Bowel sounds present. No organomegaly or mass.  EXTREMITIES: No pedal edema, cyanosis, or clubbing.  NEUROLOGIC: Cranial nerves II through XII are intact. Muscle strength 4/5 in all extremities. Sensation intact. Gait not checked.  PSYCHIATRIC: The patient is alert and oriented x 3.  SKIN: No obvious rash, lesion, or ulcer.   DATA REVIEW:   CBC  Recent Labs Lab 12/26/15 1614  WBC 7.6  HGB 10.8*  HCT 33.4*  PLT 177     Chemistries   Recent Labs Lab 12/26/15 1614 12/27/15 0544  NA 138  --   K 3.2*  --   CL 99*  --   CO2 29  --   GLUCOSE 245*  --   BUN 24*  --   CREATININE 0.85 0.83  CALCIUM 8.1*  --     Cardiac Enzymes No results for input(s): TROPONINI in the last 168 hours.  Microbiology Results  Results for orders placed or performed in visit on 07/26/14  Culture, blood (single)     Status: None   Collection Time: 07/26/14  3:49 PM  Result Value Ref Range Status  Micro Text Report   Final       ORGANISM 1                STAPHYLOCOCCUS AUREUS   COMMENT                   IN AEROBIC BOTTLE ONLY   GRAM STAIN                GRAM POSITIVE COCCI   ANTIBIOTIC                    ORG#1     CIPROFLOXACIN                 S         CLINDAMYCIN                   S         ERYTHROMYCIN                  S         GENTAMICIN                    S         LEVOFLOXACIN                  S         LINEZOLID                     S         OXACILLIN                     S         TIGECYCLINE                   S         CEFOXITIN SCREEN              NEGATIVE  INDUCIBLE CLINDAMYCIN RESISTANNEGATIVE    Culture, blood (single)     Status: None   Collection Time: 07/26/14  4:00 PM  Result Value Ref Range Status   Micro Text Report   Final       COMMENT                   NO GROWTH AEROBICALLY/ANAEROBICALLY IN 5 DAYS   ANTIBIOTIC                                                      Culture, blood (single)     Status: None   Collection Time: 07/30/14 12:10 PM  Result Value Ref Range Status   Micro Text Report   Final       SOURCE: right hand    COMMENT                   NO GROWTH AEROBICALLY/ANAEROBICALLY IN 5 DAYS   ANTIBIOTIC                                                      Culture, blood (single)  Status: None   Collection Time: 07/30/14 12:17 PM  Result Value Ref Range Status   Micro Text Report   Final       SOURCE: right ac    COMMENT                   NO GROWTH  AEROBICALLY/ANAEROBICALLY IN 5 DAYS   ANTIBIOTIC                                                        RADIOLOGY:  No results found.      Management plans discussed with the patient, daughter and they are in agreement.  CODE STATUS:  Code Status History    Date Active Date Inactive Code Status Order ID Comments User Context   12/20/2015  8:42 PM 12/27/2015  6:17 PM Full Code [id number]  Theodoro Grist, MD Inpatient    Advance Directive Documentation        Most Recent Value   Type of Advance Directive  Healthcare Power of Gerald, Living will   Pre-existing out of facility DNR order (yellow form or pink MOST form)     "MOST" Form in Place?        TOTAL TIME TAKING CARE OF THIS PATIENT: 36 minutes.    Demetrios Loll M.D on 12/27/2015 at 6:26 PM  Between 7am to 6pm - Pager - [phone]  After 6pm go to [email or web address] - password [email or web address]  Office  [phone]  CC: Primary care physician; Viviana Simpler, MD

## 2015-12-27 NOTE — Discharge Instructions (Signed)
Diet: As you were doing prior to hospitalization   Shower:  May shower, do not submerge incision site under water.  Dressing:  You may change your dressing as needed. Allow glue to come off on its own.  Activity:  Increase activity slowly as tolerated.  Weight Bearing:   Weight bearing as tolerated  To prevent constipation: you may use a stool softener such as -  Colace (over the counter) 100 mg by mouth twice a day  Drink plenty of fluids (prune juice may be helpful) and high fiber foods Miralax (over the counter) for constipation as needed.    Itching:  If you experience itching with your medications, try taking only a single pain pill, or even half a pain pill at a time.  You may take up to 10 pain pills per day, and you can also use benadryl over the counter for itching or also to help with sleep.   Precautions:  If you experience chest pain or shortness of breath - call 911 immediately for transfer to the hospital emergency department!!  If you develop a fever greater that 101 F, purulent drainage from wound, increased redness or drainage from wound, or calf pain-Call [facility]                                              Follow- Up Appointment:  Please call for an appointment to be seen in 2 weeks at [facility]

## 2015-12-28 ENCOUNTER — Ambulatory Visit
Admit: 2015-12-28 | Discharge: 2015-12-28 | Disposition: A | Discharge status: Home / Self Care | Payer: Medicare Other | Attending: Radiation Oncology | Admitting: Radiation Oncology

## 2015-12-28 ENCOUNTER — Telehealth: Payer: Self-pay

## 2015-12-28 DIAGNOSIS — Z510 Encounter for antineoplastic radiation therapy: Secondary | ICD-10-CM | POA: Diagnosis not present

## 2015-12-28 DIAGNOSIS — J449 Chronic obstructive pulmonary disease, unspecified: Secondary | ICD-10-CM | POA: Diagnosis not present

## 2015-12-28 DIAGNOSIS — G35 Multiple sclerosis: Secondary | ICD-10-CM | POA: Diagnosis not present

## 2015-12-28 DIAGNOSIS — S7292XS Unspecified fracture of left femur, sequela: Secondary | ICD-10-CM | POA: Diagnosis not present

## 2015-12-28 DIAGNOSIS — C50912 Malignant neoplasm of unspecified site of left female breast: Secondary | ICD-10-CM | POA: Diagnosis not present

## 2015-12-28 DIAGNOSIS — E1121 Type 2 diabetes mellitus with diabetic nephropathy: Secondary | ICD-10-CM | POA: Diagnosis not present

## 2015-12-28 DIAGNOSIS — M109 Gout, unspecified: Secondary | ICD-10-CM | POA: Diagnosis not present

## 2015-12-28 DIAGNOSIS — S72332D Displaced oblique fracture of shaft of left femur, subsequent encounter for closed fracture with routine healing: Secondary | ICD-10-CM | POA: Diagnosis not present

## 2015-12-28 DIAGNOSIS — C7951 Secondary malignant neoplasm of bone: Secondary | ICD-10-CM | POA: Diagnosis not present

## 2015-12-28 NOTE — Telephone Encounter (Signed)
Transition Care Management Follow-up Telephone Call    Date discharged? 12/27/15   How have you been since you were released from the hospital? Health status same   Do you understand why you were in the hospital? Yes   Do you understand the discharge instructions? No, daughter spoke with MD   Where were you discharged to? Home   Items Reviewed:  Medications reviewed: No, daughter manages medications  Allergies reviewed: No  Dietary changes reviewed: No  Referrals reviewed: No, daughter manages appointments   Functional Questionnaire:   Activities of Daily Living (ADLs):   He states they are independent in the following: independent with toileting and feeding but requires assistance with dressing and ambulation    Any transportation issues/concerns?: NO, daughter provides transportation   Any patient concerns? NO   Confirmed importance and date/time of follow-up visits scheduled YES  Provider Appointment booked with Dr. Silvio Pate  Confirmed with patient if condition begins to worsen call PCP or go to the ER.  Patient was given the office number and encouraged to call back with question or concerns.  : YES

## 2015-12-29 DIAGNOSIS — S72332D Displaced oblique fracture of shaft of left femur, subsequent encounter for closed fracture with routine healing: Secondary | ICD-10-CM | POA: Diagnosis not present

## 2015-12-29 DIAGNOSIS — S7292XS Unspecified fracture of left femur, sequela: Secondary | ICD-10-CM | POA: Diagnosis not present

## 2015-12-29 DIAGNOSIS — M109 Gout, unspecified: Secondary | ICD-10-CM | POA: Diagnosis not present

## 2015-12-29 DIAGNOSIS — G35 Multiple sclerosis: Secondary | ICD-10-CM | POA: Diagnosis not present

## 2015-12-29 DIAGNOSIS — E1121 Type 2 diabetes mellitus with diabetic nephropathy: Secondary | ICD-10-CM | POA: Diagnosis not present

## 2015-12-29 DIAGNOSIS — J449 Chronic obstructive pulmonary disease, unspecified: Secondary | ICD-10-CM | POA: Diagnosis not present

## 2015-12-29 NOTE — Telephone Encounter (Signed)
Let her know that I can still come out for home visits if needed. I expect she is going to be going out regularly though for her radiation. I will be in touch with her soon to see how she is doing

## 2015-12-31 ENCOUNTER — Ambulatory Visit
Admit: 2015-12-31 | Discharge: 2015-12-31 | Disposition: A | Discharge status: Home / Self Care | Payer: Medicare Other | Attending: Radiation Oncology | Admitting: Radiation Oncology

## 2015-12-31 DIAGNOSIS — Z510 Encounter for antineoplastic radiation therapy: Secondary | ICD-10-CM | POA: Diagnosis not present

## 2015-12-31 DIAGNOSIS — C50912 Malignant neoplasm of unspecified site of left female breast: Secondary | ICD-10-CM | POA: Diagnosis not present

## 2015-12-31 DIAGNOSIS — C7951 Secondary malignant neoplasm of bone: Secondary | ICD-10-CM | POA: Diagnosis not present

## 2015-12-31 LAB — SURGICAL PATHOLOGY

## 2015-12-31 NOTE — Telephone Encounter (Signed)
Spoke with patient and she wants to come in but doesn't know when with the chemo and radiation. Pt's daughter wanted to cancel that appt because pt has another on the same day and they will call when pt can come in.  appt canceled

## 2015-12-31 NOTE — Telephone Encounter (Signed)
I will call and check on her soon

## 2016-01-01 ENCOUNTER — Encounter: Payer: Self-pay | Admitting: Pain Medicine

## 2016-01-01 ENCOUNTER — Ambulatory Visit: Payer: Medicare Other | Attending: Pain Medicine | Admitting: Pain Medicine

## 2016-01-01 ENCOUNTER — Ambulatory Visit
Admit: 2016-01-01 | Discharge: 2016-01-01 | Disposition: A | Discharge status: Home / Self Care | Payer: Medicare Other | Attending: Radiation Oncology | Admitting: Radiation Oncology

## 2016-01-01 VITALS — BP 119/61 | HR 71 | Temp 97.9°F | Resp 16 | Ht 62.0 in | Wt 269.0 lb

## 2016-01-01 DIAGNOSIS — M5136 Other intervertebral disc degeneration, lumbar region: Secondary | ICD-10-CM | POA: Insufficient documentation

## 2016-01-01 DIAGNOSIS — M5137 Other intervertebral disc degeneration, lumbosacral region: Secondary | ICD-10-CM

## 2016-01-01 DIAGNOSIS — M47817 Spondylosis without myelopathy or radiculopathy, lumbosacral region: Secondary | ICD-10-CM | POA: Diagnosis not present

## 2016-01-01 DIAGNOSIS — S72402D Unspecified fracture of lower end of left femur, subsequent encounter for closed fracture with routine healing: Secondary | ICD-10-CM | POA: Insufficient documentation

## 2016-01-01 DIAGNOSIS — C7951 Secondary malignant neoplasm of bone: Secondary | ICD-10-CM | POA: Diagnosis not present

## 2016-01-01 DIAGNOSIS — C50912 Malignant neoplasm of unspecified site of left female breast: Secondary | ICD-10-CM | POA: Diagnosis not present

## 2016-01-01 DIAGNOSIS — E11621 Type 2 diabetes mellitus with foot ulcer: Secondary | ICD-10-CM

## 2016-01-01 DIAGNOSIS — Z510 Encounter for antineoplastic radiation therapy: Secondary | ICD-10-CM | POA: Diagnosis not present

## 2016-01-01 DIAGNOSIS — M791 Myalgia: Secondary | ICD-10-CM | POA: Diagnosis not present

## 2016-01-01 DIAGNOSIS — M545 Low back pain: Secondary | ICD-10-CM | POA: Diagnosis present

## 2016-01-01 DIAGNOSIS — S7292XD Unspecified fracture of left femur, subsequent encounter for closed fracture with routine healing: Secondary | ICD-10-CM

## 2016-01-01 DIAGNOSIS — M542 Cervicalgia: Secondary | ICD-10-CM | POA: Diagnosis present

## 2016-01-01 DIAGNOSIS — Z853 Personal history of malignant neoplasm of breast: Secondary | ICD-10-CM | POA: Diagnosis not present

## 2016-01-01 DIAGNOSIS — X58XXXA Exposure to other specified factors, initial encounter: Secondary | ICD-10-CM | POA: Diagnosis not present

## 2016-01-01 DIAGNOSIS — E1140 Type 2 diabetes mellitus with diabetic neuropathy, unspecified: Secondary | ICD-10-CM | POA: Diagnosis not present

## 2016-01-01 DIAGNOSIS — E1340 Other specified diabetes mellitus with diabetic neuropathy, unspecified: Secondary | ICD-10-CM

## 2016-01-01 DIAGNOSIS — M47816 Spondylosis without myelopathy or radiculopathy, lumbar region: Secondary | ICD-10-CM | POA: Insufficient documentation

## 2016-01-01 DIAGNOSIS — M546 Pain in thoracic spine: Secondary | ICD-10-CM | POA: Diagnosis present

## 2016-01-01 DIAGNOSIS — I739 Peripheral vascular disease, unspecified: Secondary | ICD-10-CM

## 2016-01-01 DIAGNOSIS — L97509 Non-pressure chronic ulcer of other part of unspecified foot with unspecified severity: Secondary | ICD-10-CM

## 2016-01-01 DIAGNOSIS — M5416 Radiculopathy, lumbar region: Secondary | ICD-10-CM | POA: Diagnosis not present

## 2016-01-01 DIAGNOSIS — E669 Obesity, unspecified: Secondary | ICD-10-CM | POA: Diagnosis not present

## 2016-01-01 DIAGNOSIS — M48062 Spinal stenosis, lumbar region with neurogenic claudication: Secondary | ICD-10-CM

## 2016-01-01 DIAGNOSIS — L97519 Non-pressure chronic ulcer of other part of right foot with unspecified severity: Secondary | ICD-10-CM

## 2016-01-01 DIAGNOSIS — M461 Sacroiliitis, not elsewhere classified: Secondary | ICD-10-CM | POA: Diagnosis not present

## 2016-01-01 DIAGNOSIS — M5030 Other cervical disc degeneration, unspecified cervical region: Secondary | ICD-10-CM

## 2016-01-01 MED ORDER — BACLOFEN 10 MG PO TABS: ORAL_TABLET | ORAL | Status: DC

## 2016-01-01 MED ORDER — GABAPENTIN 300 MG PO CAPS: ORAL_CAPSULE | ORAL | Status: DC

## 2016-01-01 MED ORDER — OXYCODONE HCL 5 MG PO TABS: ORAL_TABLET | ORAL | Status: DC

## 2016-01-01 NOTE — Progress Notes (Signed)
Subjective:    Patient ID: Madeline [name], female    DOB: 1951-09-14, 65 y.o.   MRN: [id number]  HPI  The patient is a 65 year old female who returns to pain management for further evaluation and treatment of pain involving the lower back lower extremity region with mid back pain as well as pain involving the region of the neck. The patient has undergone recent evaluation and oncology and is with evidence of malignant findings in the bone. The patient is undergone biopsy and will follow-up with Dr. Grayland Ormond and oncology as discussed. The patient is also with recent history of fracture of the left lower extremity and is undergone open reduction internal fixation with Dr. Dorothyann Peng Department of orthopedics [facility]. At the present time we will continue Neurontin and oxycodone. We informed patient that patient may receive additional medication from oncology for treatment of her pain as discussed and that she is to inform us of any medications received from oncology. We will avoid interventional treatment and we'll remain available to consider modifications of treatment regimen pending further assessment of patient's condition and oncology as well as with Dr. Dorothyann Peng orthopedic surgeon. The patient agreed to suggested treatment plan.  Review of Systems     Objective:   Physical Exam  There was tenderness over the splenius capitis and occipitalis musculature region a moderate degree with moderate tenderness of the acromioclavicular and glenohumeral joint region as well as the thoracic facet thoracic paraspinal musculature region.. The patient appeared to be with slightly decreased grip strength and Tinel and Phalen's maneuver were without increased pain of significant degree. The patient appeared to be unremarkable Spurling's maneuver. Palpation of the mid thoracic region was with moderate tenderness to palpation with evidence of muscle spasms of moderate degree in the mid and  lower thoracic region. No definite crepitus of the thoracic region was noted. Palpation over the lumbar paraspinal must reason lumbar facet region was attends to palpation of moderate degree. There was moderate tenderness of the PSIS and PII S region as well . Straight leg raise was tolerates approximately 20 with EHL strength decreased on the left compared to the right. There was questionably decreased sensation along the L5 dermatomal distribution. Knees were tenderness to palpation with no increased warmth and erythema in the region of the knees. Palpation of the greater trochanteric region iliotibial band region was with moderate tenderness to palpation as well. EHL strength appeared to be decreased. Was negative clonus negative Homans. Abdomen was protuberant without excessive tends to palpation and no costovertebral tenderness was noted.      Assessment & Plan:    Degenerative disc disease lumbar spine Degenerative changes lumbar spine with multilevel involvement with L4-L5 level most significantly involved Lumbar facet syndrome  Fracture of left lower extremity(recent) Comminuted, oblique intra-articular fracture involving the mid and distal left femur Patient is status post surgical intervention for fracture performed at [facility] Department of orthopedic surgery  Diabetic neuropathy  Obesity  Degenerative joint disease of shoulder  History of carcinoma of breast with malignant findings of the bone    PLAN    Continue present medication Neurontin oxycodone and baclofen  F/U PCP Dr. Silvio Pate  for evaliation of  BP diabetes mellitus fracture of lower extremity and general medical  condition  F/U surgical evaluation. . Patient will undergo follow-up surgical evaluation with Dr. Dorothyann Peng as discussed . Limited activity as per Dr. Dorothyann Peng   F/U neurological evaluation. May consider pending follow-up evaluations  F/U Dr. Grayland Ormond and oncology as  planned  F/U wound care clinic evaluation as needed  May consider radiofrequency rhizolysis or intraspinal procedures pending response to present treatment and F/U evaluation . We will avoid considering such procedures   Patient to call [facility] should patient have concerns prior to scheduled return appointment.

## 2016-01-01 NOTE — Patient Instructions (Addendum)
PLAN    Continue present medication Neurontin oxycodone and baclofen  F/U PCP Dr. Silvio Pate  for evaliation of  BP diabetes mellitus fracture of lower extremity and general medical  condition  F/U surgical evaluation. . Patient will undergo follow-up surgical evaluation with Dr. Dorothyann Peng as discussed . Limited activity as per Dr. Dorothyann Peng   F/U neurological evaluation. May consider pending follow-up evaluations   F/U Dr. Grayland Ormond and oncology as planned  F/U wound care clinic evaluation as needed  May consider radiofrequency rhizolysis or intraspinal procedures pending response to present treatment and F/U evaluation . We will avoid considering such procedures   Patient to call [facility] should patient have concerns prior to scheduled return appointment.

## 2016-01-01 NOTE — Progress Notes (Signed)
Safety precautions to be maintained throughout the outpatient stay will include: orient to surroundings, keep bed in low position, maintain call bell within reach at all times, provide assistance with transfer out of bed and ambulation.  

## 2016-01-02 ENCOUNTER — Ambulatory Visit
Admit: 2016-01-02 | Discharge: 2016-01-02 | Disposition: A | Discharge status: Home / Self Care | Payer: Medicare Other | Attending: Radiation Oncology | Admitting: Radiation Oncology

## 2016-01-02 DIAGNOSIS — C7951 Secondary malignant neoplasm of bone: Secondary | ICD-10-CM | POA: Diagnosis not present

## 2016-01-02 DIAGNOSIS — C50912 Malignant neoplasm of unspecified site of left female breast: Secondary | ICD-10-CM | POA: Diagnosis not present

## 2016-01-02 DIAGNOSIS — Z510 Encounter for antineoplastic radiation therapy: Secondary | ICD-10-CM | POA: Diagnosis not present

## 2016-01-03 ENCOUNTER — Telehealth: Payer: Self-pay

## 2016-01-03 ENCOUNTER — Ambulatory Visit
Admit: 2016-01-03 | Discharge: 2016-01-03 | Disposition: A | Discharge status: Home / Self Care | Payer: Medicare Other | Attending: Radiation Oncology | Admitting: Radiation Oncology

## 2016-01-03 ENCOUNTER — Inpatient Hospital Stay: Payer: Medicare Other | Attending: Oncology | Admitting: Oncology

## 2016-01-03 DIAGNOSIS — Z9012 Acquired absence of left breast and nipple: Secondary | ICD-10-CM | POA: Diagnosis not present

## 2016-01-03 DIAGNOSIS — Z9223 Personal history of estrogen therapy: Secondary | ICD-10-CM | POA: Diagnosis not present

## 2016-01-03 DIAGNOSIS — E119 Type 2 diabetes mellitus without complications: Secondary | ICD-10-CM | POA: Diagnosis not present

## 2016-01-03 DIAGNOSIS — R531 Weakness: Secondary | ICD-10-CM | POA: Diagnosis not present

## 2016-01-03 DIAGNOSIS — Z510 Encounter for antineoplastic radiation therapy: Secondary | ICD-10-CM | POA: Diagnosis not present

## 2016-01-03 DIAGNOSIS — J449 Chronic obstructive pulmonary disease, unspecified: Secondary | ICD-10-CM | POA: Diagnosis not present

## 2016-01-03 DIAGNOSIS — E039 Hypothyroidism, unspecified: Secondary | ICD-10-CM | POA: Diagnosis not present

## 2016-01-03 DIAGNOSIS — Z794 Long term (current) use of insulin: Secondary | ICD-10-CM | POA: Diagnosis not present

## 2016-01-03 DIAGNOSIS — I10 Essential (primary) hypertension: Secondary | ICD-10-CM | POA: Insufficient documentation

## 2016-01-03 DIAGNOSIS — C799 Secondary malignant neoplasm of unspecified site: Secondary | ICD-10-CM

## 2016-01-03 DIAGNOSIS — E1121 Type 2 diabetes mellitus with diabetic nephropathy: Secondary | ICD-10-CM | POA: Diagnosis not present

## 2016-01-03 DIAGNOSIS — M109 Gout, unspecified: Secondary | ICD-10-CM | POA: Diagnosis not present

## 2016-01-03 DIAGNOSIS — E785 Hyperlipidemia, unspecified: Secondary | ICD-10-CM | POA: Diagnosis not present

## 2016-01-03 DIAGNOSIS — Z7984 Long term (current) use of oral hypoglycemic drugs: Secondary | ICD-10-CM | POA: Diagnosis not present

## 2016-01-03 DIAGNOSIS — M549 Dorsalgia, unspecified: Secondary | ICD-10-CM | POA: Insufficient documentation

## 2016-01-03 DIAGNOSIS — Z79899 Other long term (current) drug therapy: Secondary | ICD-10-CM | POA: Diagnosis not present

## 2016-01-03 DIAGNOSIS — F17210 Nicotine dependence, cigarettes, uncomplicated: Secondary | ICD-10-CM | POA: Insufficient documentation

## 2016-01-03 DIAGNOSIS — Z853 Personal history of malignant neoplasm of breast: Secondary | ICD-10-CM | POA: Diagnosis not present

## 2016-01-03 DIAGNOSIS — Z79811 Long term (current) use of aromatase inhibitors: Secondary | ICD-10-CM | POA: Diagnosis not present

## 2016-01-03 DIAGNOSIS — Z7982 Long term (current) use of aspirin: Secondary | ICD-10-CM | POA: Insufficient documentation

## 2016-01-03 DIAGNOSIS — R5383 Other fatigue: Secondary | ICD-10-CM | POA: Diagnosis not present

## 2016-01-03 DIAGNOSIS — C50912 Malignant neoplasm of unspecified site of left female breast: Secondary | ICD-10-CM | POA: Insufficient documentation

## 2016-01-03 DIAGNOSIS — G35 Multiple sclerosis: Secondary | ICD-10-CM | POA: Diagnosis not present

## 2016-01-03 DIAGNOSIS — S72332D Displaced oblique fracture of shaft of left femur, subsequent encounter for closed fracture with routine healing: Secondary | ICD-10-CM | POA: Diagnosis not present

## 2016-01-03 DIAGNOSIS — C7951 Secondary malignant neoplasm of bone: Secondary | ICD-10-CM | POA: Insufficient documentation

## 2016-01-03 DIAGNOSIS — S7292XS Unspecified fracture of left femur, sequela: Secondary | ICD-10-CM | POA: Diagnosis not present

## 2016-01-03 MED ORDER — FENTANYL 25 MCG/HR TD PT72: 25.0000 ug | MEDICATED_PATCH | TRANSDERMAL | Status: DC

## 2016-01-03 MED ORDER — LETROZOLE 2.5 MG PO TABS: 2.5000 mg | ORAL_TABLET | Freq: Every day | ORAL | Status: DC

## 2016-01-03 NOTE — Progress Notes (Signed)
Patient is here for hospital f/u where she was diagnosed with acute compression fracture and she reports that she does not wish to pursue treatment.  The pain is progressively getting worse and would like to discuss pain management.  Her daughter mentions a knot on the right side of her head that she noticed earlier this week.

## 2016-01-03 NOTE — Telephone Encounter (Signed)
I received a notification from [facility] that the patient has stopped Copaxone. I called the patient to confirm. She is battling cancer right now and she feels like she may not live much longer so she does not need her Copaxone. She is meeting with doctors today to discuss palliative care. I advised that if she decides to see Dr. Jannifer Franklin or to start MS medications again to call our office.

## 2016-01-03 NOTE — Telephone Encounter (Signed)
Events noted

## 2016-01-04 ENCOUNTER — Other Ambulatory Visit: Payer: Self-pay | Admitting: *Deleted

## 2016-01-04 ENCOUNTER — Ambulatory Visit
Admit: 2016-01-04 | Discharge: 2016-01-04 | Disposition: A | Discharge status: Home / Self Care | Payer: Medicare Other | Attending: Radiation Oncology | Admitting: Radiation Oncology

## 2016-01-04 DIAGNOSIS — C50912 Malignant neoplasm of unspecified site of left female breast: Secondary | ICD-10-CM | POA: Diagnosis not present

## 2016-01-04 DIAGNOSIS — S72332D Displaced oblique fracture of shaft of left femur, subsequent encounter for closed fracture with routine healing: Secondary | ICD-10-CM | POA: Diagnosis not present

## 2016-01-04 DIAGNOSIS — C799 Secondary malignant neoplasm of unspecified site: Secondary | ICD-10-CM

## 2016-01-04 DIAGNOSIS — Z510 Encounter for antineoplastic radiation therapy: Secondary | ICD-10-CM | POA: Diagnosis not present

## 2016-01-04 DIAGNOSIS — C7951 Secondary malignant neoplasm of bone: Secondary | ICD-10-CM | POA: Diagnosis not present

## 2016-01-04 DIAGNOSIS — G35 Multiple sclerosis: Secondary | ICD-10-CM | POA: Diagnosis not present

## 2016-01-04 DIAGNOSIS — E1121 Type 2 diabetes mellitus with diabetic nephropathy: Secondary | ICD-10-CM | POA: Diagnosis not present

## 2016-01-04 DIAGNOSIS — S7292XS Unspecified fracture of left femur, sequela: Secondary | ICD-10-CM | POA: Diagnosis not present

## 2016-01-04 DIAGNOSIS — M109 Gout, unspecified: Secondary | ICD-10-CM | POA: Diagnosis not present

## 2016-01-04 DIAGNOSIS — J449 Chronic obstructive pulmonary disease, unspecified: Secondary | ICD-10-CM | POA: Diagnosis not present

## 2016-01-04 MED ORDER — LETROZOLE 2.5 MG PO TABS: 2.5000 mg | ORAL_TABLET | Freq: Every day | ORAL | Status: AC

## 2016-01-07 ENCOUNTER — Ambulatory Visit
Admit: 2016-01-07 | Discharge: 2016-01-07 | Disposition: A | Discharge status: Home / Self Care | Payer: Medicare Other | Attending: Radiation Oncology | Admitting: Radiation Oncology

## 2016-01-07 ENCOUNTER — Telehealth: Payer: Self-pay | Admitting: *Deleted

## 2016-01-07 DIAGNOSIS — Z510 Encounter for antineoplastic radiation therapy: Secondary | ICD-10-CM | POA: Diagnosis not present

## 2016-01-07 DIAGNOSIS — C7951 Secondary malignant neoplasm of bone: Secondary | ICD-10-CM | POA: Diagnosis not present

## 2016-01-07 DIAGNOSIS — C50912 Malignant neoplasm of unspecified site of left female breast: Secondary | ICD-10-CM | POA: Diagnosis not present

## 2016-01-07 NOTE — Telephone Encounter (Signed)
Back pain felt like it was "going to break in half" used Oxycodone 10 mg. Fenatnyl 25 mcg was started on Thursday, but pain is not controlled. She has XRT this afternoon at 1. Please advise

## 2016-01-07 NOTE — Telephone Encounter (Signed)
Call returned and advised per VO Dr Grayland Ormond to put on 2 patches  For 50 mcg dosing. She repeated this back to me

## 2016-01-07 NOTE — Progress Notes (Signed)
[place]  [phone] [phone]  ID: Madeline [name] OB: 1951/08/05  MR#: [id number]  [id number]  Patient Care Team: Venia Carbon, MD as PCP - General  CHIEF COMPLAINT:  Chief Complaint  Patient presents with  . Breast Cancer    INTERVAL HISTORY: Patient returns to clinic today for Hospital follow-up and treatment planning. She was recently admitted with acute vertebral compression fracture and found to have widely metastatic bony disease consistent with breast cancer. Biopsy during kyphoplasty confirmed the diagnosis. She continues to have back painr. She also has increased weakness and fatigue. She has no neurologic complaints. She denies any fevers. She denies any chest pain or shortness of breath. She has a good appetite and denies any nausea, vomiting, constipation, or diarrhea.  Patient has no urinary complaints.  She offers no further specific complaints today.  REVIEW OF SYSTEMS:   Review of Systems  Constitutional: Negative.  Negative for fever and malaise/fatigue.  Respiratory: Negative.  Negative for shortness of breath.   Cardiovascular: Negative.   Gastrointestinal: Negative.   Musculoskeletal: Positive for back pain, joint pain and falls.  Neurological: Negative.  Negative for weakness.    As per HPI. Otherwise, a complete review of systems is negatve.  PAST MEDICAL HISTORY: Past Medical History  Diagnosis Date  . Depression   . Goiter, nodular     multi  . Dyslipidemia   . NIDDM, uncontrolled, with neuropathy   . Hypertension   . Allergy   . GERD (gastroesophageal reflux disease)   . Urinary incontinence   . Peripheral neuropathy ([place])   . Multiple sclerosis ([place])   . Breast cancer ([place])   . Hypothyroidism   . Asthma   . COPD (chronic obstructive pulmonary disease) ([place])   . DVT (deep venous thrombosis) ([facility]) 1990's    right leg  . Gout   . Femur fracture ([place])   . Frequent UTI     PAST SURGICAL  HISTORY: Past Surgical History  Procedure Laterality Date  . Thyroidectomy  09/2004  . Transthoracic echocardiogram  05/16/2004  . Partial hysterectomy  1975  . Axillary hidradenitis excision  1993    Excision biopsy growth right axilla, benign   . Other surgical history      Thyroid biopsy 10/99  . Carpal tunnel release  03/2008    bilateral  . Mastectomy, radical  02/2009    left modified  . Tonsillectomy    . Appendectomy    . Femur fracture surgery Left june 2016  . Kyphoplasty N/A 12/24/2015    Procedure: KYPHOPLASTY;  Surgeon: Hessie Knows, MD;  Location: [facility] ORS;  Service: Orthopedics;  Laterality: N/A;    FAMILY HISTORY Family History  Problem Relation Age of Onset  . Kidney failure Mother   . Heart disease Father 58  . Heart disease Brother   . Heart disease Brother        ADVANCED DIRECTIVES:    HEALTH MAINTENANCE: Social History  Substance Use Topics  . Smoking status: Current Every Day Smoker -- 0.00 packs/day for 50 years    Types: Cigarettes    Last Attempt to Quit: 07/12/2015  . Smokeless tobacco: Never Used     Comment: almost stopped, Pt states smokes 1 cigarette a day.  . Alcohol Use: No     Colonoscopy:  PAP:  Bone density:  Lipid panel:  Allergies  Allergen Reactions  . Penicillins Swelling    tongue and throat swelling tolerates cephalosporins  . Sulfa  Antibiotics Hives and Itching  . Trovan [Alatrofloxacin] Hives  . Rosiglitazone Maleate Swelling    Current Outpatient Prescriptions  Medication Sig Dispense Refill  . albuterol (PROVENTIL) (2.5 MG/3ML) 0.083% nebulizer solution Take 3 mLs (2.5 mg total) by nebulization every 6 (six) hours as needed for wheezing or shortness of breath. 75 mL 11  . allopurinol (ZYLOPRIM) 300 MG tablet Take 1 tablet (300 mg total) by mouth daily. 90 tablet 3  . aspirin 325 MG tablet Take 325 mg by mouth daily.      . baclofen (LIORESAL) 10 MG tablet Limit 1 tablet by mouth twice a day to 3 times a day  if tolerated 80 each 0  . bisacodyl (DULCOLAX) 5 MG EC tablet Take 1 tablet (5 mg total) by mouth daily as needed for moderate constipation. 20 tablet 0  . budesonide-formoterol (SYMBICORT) 160-4.5 MCG/ACT inhaler Inhale 1 puff into the lungs 2 (two) times daily. 10.2 g 5  . Calcium Carbonate-Vitamin D (CALCIUM-VITAMIN D) 600-200 MG-UNIT CAPS Take by mouth 2 (two) times daily.     . carvedilol (COREG) 25 MG tablet Take 1 tablet (25 mg total) by mouth 2 (two) times daily with a meal. 180 tablet 3  . Cholecalciferol (VITAMIN D3) 1000 UNITS CAPS Take 4,000 capsules by mouth every morning.    . colchicine 0.6 MG tablet TAKE ONE TABLET TWICE DAILY AS NEEDED 60 tablet 0  . diazepam (VALIUM) 5 MG tablet TAKE ONE (1) TABLET BY MOUTH TWO (2) TIMES DAILY 60 tablet 0  . docusate sodium (COLACE) 100 MG capsule Take 1 capsule (100 mg total) by mouth 2 (two) times daily. 20 capsule 0  . fish oil-omega-3 fatty acids 1000 MG capsule Take 2 g by mouth daily.      Marland Kitchen FLUoxetine (PROZAC) 20 MG capsule TAKE 2 CAPSULES BY MOUTH EVERY DAY 180 capsule 3  . furosemide (LASIX) 80 MG tablet Take 1 tablet (80 mg total) by mouth daily. Take second dose around lunchtime if your legs are swelling 180 tablet 3  . gabapentin (NEURONTIN) 300 MG capsule Limit 1-2 tablets by mouth 2-4 times per day if tolerated 240 capsule 0  . glucose blood (ONE TOUCH TEST STRIPS) test strip Use as instructed to test blood sugar 3 times daily dx: E11.40 300 each 3  . insulin lispro (HUMALOG) 100 UNIT/ML KiwkPen Use according to sliding scale as directed 15 mL 3  . Insulin Pen Needle 30G X 5 MM MISC Use as instructed to inject insulin 2-3 times daily dx 250.60 300 each 6  . LANTUS SOLOSTAR 100 UNIT/ML Solostar Pen Inject 47 Units into the skin 2 (two) times daily.     Marland Kitchen levothyroxine (SYNTHROID, LEVOTHROID) 175 MCG tablet Take 1 tablet (175 mcg total) by mouth daily. 90 tablet 3  . meloxicam (MOBIC) 15 MG tablet Take 1 tablet (15 mg total) by mouth  daily as needed. 90 tablet 3  . metFORMIN (GLUCOPHAGE) 1000 MG tablet Take 1 tablet (1,000 mg total) by mouth daily with breakfast. 90 tablet 3  . omeprazole (PRILOSEC) 20 MG capsule TAKE ONE CAPSULE BY MOUTH TWICE DAILY 180 capsule 3  . ONETOUCH DELICA LANCETS 99991111 MISC Use to test blood sugar three times daily dx: E11.40 300 each 0  . oxyCODONE (ROXICODONE) 5 MG immediate release tablet Limit 1 tablet by mouth 2-4 times per day if tolerated 120 tablet 0  . potassium chloride (KLOR-CON M10) 10 MEQ tablet TAKE 2 TABLETS BY MOUTH EVERY DAY 180 tablet 3  .  tiotropium (SPIRIVA HANDIHALER) 18 MCG inhalation capsule PLACE 1 CAPSULE (18 MCG TOTAL) INTO INHALER AND INHALE DAILY AS NEEDED. 90 capsule 3  . traZODone (DESYREL) 100 MG tablet Take 2 tablets (200 mg total) by mouth at bedtime. 180 tablet 3  . COPAXONE 40 MG/ML SOSY Inject 40 mg into the skin 3 (three) times a week. (Patient not taking: Reported on 01/03/2016) 36 Syringe 3  . fentaNYL (DURAGESIC) 25 MCG/HR patch Place 1 patch (25 mcg total) onto the skin every 3 (three) days. 10 patch 0  . letrozole (FEMARA) 2.5 MG tablet Take 1 tablet (2.5 mg total) by mouth daily. 30 tablet 5   No current facility-administered medications for this visit.    OBJECTIVE: There were no vitals filed for this visit.   There is no weight on file to calculate BMI.    ECOG FS:2 - Symptomatic, <50% confined to bed  General: Well-developed, well-nourished, no acute distress. Sitting in a wheelchair. Eyes: Pink conjunctiva, anicteric sclera. Breasts: Left breast mastectomy.  Patient requested exam be deferred today. Lungs: Clear to auscultation bilaterally. Heart: Regular rate and rhythm. No rubs, murmurs, or gallops. Abdomen: Soft, nontender, nondistended. No organomegaly noted, normoactive bowel sounds. Musculoskeletal: No edema, cyanosis, or clubbing. Neuro: Alert, answering all questions appropriately. Cranial nerves grossly intact. Skin: No rashes or petechiae  noted. Psych: Normal affect.   LAB RESULTS:  Lab Results  Component Value Date   NA 138 12/26/2015   K 3.2* 12/26/2015   CL 99* 12/26/2015   CO2 29 12/26/2015   GLUCOSE 245* 12/26/2015   BUN 24* 12/26/2015   CREATININE 0.83 12/27/2015   CALCIUM 8.1* 12/26/2015   PROT 7.2 12/20/2015   ALBUMIN 3.8 12/20/2015   AST 29 12/20/2015   ALT 24 12/20/2015   ALKPHOS 145* 12/20/2015   BILITOT 0.8 12/20/2015   GFRNONAA >60 12/27/2015   GFRAA >60 12/27/2015    Lab Results  Component Value Date   WBC 7.6 12/26/2015   NEUTROABS 4.5 12/26/2015   HGB 10.8* 12/26/2015   HCT 33.4* 12/26/2015   MCV 81.5 12/26/2015   PLT 177 12/26/2015      STUDIES: Dg Thoracic Spine 2 View  12/24/2015  CLINICAL DATA:  Kyphoplasty in OR at T10 and L1, breast cancer, thoracic spine metastatic disease EXAM: THORACIC SPINE 2 VIEWS ; DG C-ARM 61-120 MIN COMPARISON:  CT chest 12/20/2015 FLUOROSCOPY TIME:  3 minutes 7 seconds Submitted images:  4 FINDINGS: Diffuse osseous demineralization. Patient has had spinal augmentation procedures performed at what appear to be the T10 and L1 vertebra, assignment of spinal levels difficult due to lack of landmarks. Additional superior endplate compression fracture of what is likely the T12 vertebral body. No subluxation seen. IMPRESSION: Spinal augmentation procedures at what are likely T10 and L1 with additional anterior compression fracture identified at probable T12. Electronically Signed   By: Lavonia Dana M.D.   On: 12/24/2015 16:53   Dg Lumbar Spine 2-3 Views  12/20/2015  CLINICAL DATA:  Low back pain for 3 days EXAM: LUMBAR SPINE - 2-3 VIEW COMPARISON:  None. FINDINGS: Extremely limited evaluation secondary to body habitus and technique. There is L1 vertebral body height loss concerning for a compression fracture of indeterminate age. Alignment is normal. Intervertebral disc spaces are maintained. IMPRESSION: Age-indeterminate L1 vertebral body compression fracture.  Electronically Signed   By: Kathreen Devoid   On: 12/20/2015 12:41   Ct Angio Chest Pe W/cm &/or Wo Cm  12/20/2015  CLINICAL DATA:  History of breast  cancer. Patient complains of chronic back pain worsened over last 3 days becoming unbearable. EXAM: CT ANGIOGRAPHY CHEST WITH CONTRAST TECHNIQUE: Multidetector CT imaging of the chest was performed using the standard protocol during bolus administration of intravenous contrast. Multiplanar CT image reconstructions and MIPs were obtained to evaluate the vascular anatomy. CONTRAST:  161mL OMNIPAQUE IOHEXOL 350 MG/ML SOLN COMPARISON:  December 16, 2014 FINDINGS: There is no pulmonary embolus. There is atherosclerosis of the thoracic aorta without dissection or aneurysm. The heart size is normal. There is no pericardial effusion. There is no mediastinal or hilar lymphadenopathy. Patient status post prior left breast surgery with postsurgical scar and clips. Images of the lungs demonstrate tiny focal peripheral scar of the bilateral lung apices unchanged compared to prior exam. There is no pulmonary mass or focal pneumonia. There is no pleural effusion. Images of the visualized upper abdominal structures demonstrate gallstones within the gallbladder. The other visualized upper abdominal structures are unremarkable. There is deformity of the anterior aspect of the left mid kidney not completely included. Images of the bones demonstrate mixed lucency and sclerosis identified throughout the spine consistent consistent with bone metastasis. This is worse at the T10 level with marked bony destruction with abnormal soft tissue extending into the spinal canal narrowing the spinal canal and obliterating the right neural foraman. Review of the MIP images confirms the above findings. IMPRESSION: No pulmonary embolus. No acute abnormality identified in the chest. 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 The the metastatic changes worst at the T10 level with marked bony  destruction and abnormal soft tissue extending into the spinal canal, narrowing the spinal canal and obliterating the right neural foramen. Core compression is not excluded. Further evaluation with MRI is recommended. These results will be called to the ordering clinician or representative by the Radiologist Assistant, and communication documented in the PACS or zVision Dashboard. Electronically Signed   By: Abelardo Diesel M.D.   On: 12/20/2015 20:19   Nm Bone Scan Whole Body  12/25/2015  CLINICAL DATA:  65 year old with current history of metastatic left breast cancer. Recent CTA chest demonstrating osseous metastatic disease throughout the thoracic spine with T10, T12 and L1 compression fractures. Post augmentation at T12 and L1 yesterday. Staging evaluation. EXAM: NUCLEAR MEDICINE WHOLE BODY BONE SCAN TECHNIQUE: Whole body anterior and posterior images were obtained approximately 3 hours after intravenous injection of radiopharmaceutical. RADIOPHARMACEUTICALS:  22.3 mCi Technetium-53m MDP IV COMPARISON:  No prior nuclear imaging. Bone window images from CTA chest and unenhanced CT abdomen and pelvis 12/20/2015 are correlated. FINDINGS: Multiple foci of abnormal osseous activity indicating metastatic disease including: Right scapula; anterior upper right ribs likely the 2nd and 4th; posterior right ribs likely the 8th and 10th; right posterior left ribs likely the 4th, 6th, 7th and 10th; essentially all of the thoracic vertebrae from T3 through T12 ; all of the lumbar vertebrae from L1 through L5; the medial distal left femoral metaphysis. Degenerative activity is present in the acromioclavicular joints, right greater than left; both knee joints, left greater than right; both ankles; both hindfeet and midfeet, left greater than right. Patient was incontinent of urine. Physiologic excretion of the radiopharmaceutical by the kidneys. IMPRESSION: Multiple osseous metastases involving the axial and appendicular  skeleton as detailed above. Degenerative uptake in multiple joints as detailed above. Electronically Signed   By: Evangeline Dakin M.D.   On: 12/25/2015 14:18   Dg C-arm 61-120 Min  12/24/2015  CLINICAL DATA:  Kyphoplasty in OR at T10 and L1,  breast cancer, thoracic spine metastatic disease EXAM: THORACIC SPINE 2 VIEWS ; DG C-ARM 61-120 MIN COMPARISON:  CT chest 12/20/2015 FLUOROSCOPY TIME:  3 minutes 7 seconds Submitted images:  4 FINDINGS: Diffuse osseous demineralization. Patient has had spinal augmentation procedures performed at what appear to be the T10 and L1 vertebra, assignment of spinal levels difficult due to lack of landmarks. Additional superior endplate compression fracture of what is likely the T12 vertebral body. No subluxation seen. IMPRESSION: Spinal augmentation procedures at what are likely T10 and L1 with additional anterior compression fracture identified at probable T12. Electronically Signed   By: Lavonia Dana M.D.   On: 12/24/2015 16:53   Ct Renal Stone Study  12/20/2015  CLINICAL DATA:  Bilateral back pain for 3 days EXAM: CT ABDOMEN AND PELVIS WITHOUT CONTRAST TECHNIQUE: Multidetector CT imaging of the abdomen and pelvis was performed following the standard protocol without IV contrast. COMPARISON:  None. FINDINGS: Lung bases are free of acute infiltrate or sizable effusion. The liver, spleen, adrenal glands and pancreas are all normal in their CT appearance. The gallbladder is well distended with multiple dependent gallstones. No biliary obstructive changes are seen. Kidneys are well visualized bilaterally without evidence of renal calculi or urinary tract obstructive changes. The bladder is partially distended. Aortoiliac calcifications are noted without aneurysmal dilatation. The appendix is within normal limits. The osseous structures show postsurgical changes in the proximal left femur. There also multiple lytic lesions consistent with metastatic disease. Significant destruction  is noted in the T10 vertebral body on the right with some impingement upon the thecal sac. Lytic lesions are also noted anteriorly in the L1 vertebral body as well within the L3, L4 and L5 vertebral bodies. IMPRESSION: Cholelithiasis without complicating factors. Multiple lytic lesions within the thoracic and lumbar spine consistent with metastatic breast cancer. Compression deformity of uncertain chronicity is noted at T12. The T10 lesion encroaches upon the thecal sac. Further evaluation by means of MRI is recommended. Electronically Signed   By: Inez Catalina M.D.   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  PLAN:    1.  Breast cancer: Imaging and pathology results reviewed independently confirming stage IV disease with widespread bony metastasis.  Patient completed 5 years of letrozole in May of 2015. After lengthy discussion with the patient, she wishes to pursue treatment. She does not want to be overly aggressive therefore has agreed to reinitiate letrozole and proceed with Zometa every 4 weeks. She has not discounted pursuing chemotherapy, but does not wish to at this time. We will get a head CT in the near future to complete the staging workup. Patient will return to clinic on January 22, 2016 after the conclusion of her XRT to initiate Zometa. 2. Pain: Continue radiation as prescribed. Patient was also given a prescription for fentanyl patch today.  3.  Diabetes: Continue current medications as prescribed.  Approximately 30 minutes was spent in discussion of which greater than 50% was consultation.  Patient expressed understanding and was in agreement with this plan. She also understands that She can call clinic at any time with any questions, concerns, or complaints.    Lloyd Huger, MD   01/07/2016 12:27 PM

## 2016-01-08 ENCOUNTER — Telehealth: Payer: Self-pay | Admitting: *Deleted

## 2016-01-08 ENCOUNTER — Encounter: Payer: Self-pay | Admitting: Oncology

## 2016-01-08 ENCOUNTER — Ambulatory Visit: Payer: Medicare Other | Admitting: Internal Medicine

## 2016-01-08 ENCOUNTER — Ambulatory Visit
Admit: 2016-01-08 | Discharge: 2016-01-08 | Disposition: A | Discharge status: Home / Self Care | Payer: Medicare Other | Attending: Radiation Oncology | Admitting: Radiation Oncology

## 2016-01-08 ENCOUNTER — Other Ambulatory Visit: Payer: Self-pay | Admitting: Oncology

## 2016-01-08 ENCOUNTER — Inpatient Hospital Stay: Payer: Medicare Other

## 2016-01-08 DIAGNOSIS — M549 Dorsalgia, unspecified: Secondary | ICD-10-CM | POA: Diagnosis not present

## 2016-01-08 DIAGNOSIS — C50912 Malignant neoplasm of unspecified site of left female breast: Secondary | ICD-10-CM | POA: Diagnosis not present

## 2016-01-08 DIAGNOSIS — Z9223 Personal history of estrogen therapy: Secondary | ICD-10-CM | POA: Diagnosis not present

## 2016-01-08 DIAGNOSIS — Z7982 Long term (current) use of aspirin: Secondary | ICD-10-CM | POA: Diagnosis not present

## 2016-01-08 DIAGNOSIS — R5383 Other fatigue: Secondary | ICD-10-CM | POA: Diagnosis not present

## 2016-01-08 DIAGNOSIS — R531 Weakness: Secondary | ICD-10-CM | POA: Diagnosis not present

## 2016-01-08 DIAGNOSIS — G894 Chronic pain syndrome: Secondary | ICD-10-CM

## 2016-01-08 DIAGNOSIS — Z79899 Other long term (current) drug therapy: Secondary | ICD-10-CM | POA: Diagnosis not present

## 2016-01-08 DIAGNOSIS — E785 Hyperlipidemia, unspecified: Secondary | ICD-10-CM | POA: Diagnosis not present

## 2016-01-08 DIAGNOSIS — C799 Secondary malignant neoplasm of unspecified site: Secondary | ICD-10-CM

## 2016-01-08 DIAGNOSIS — Z79811 Long term (current) use of aromatase inhibitors: Secondary | ICD-10-CM | POA: Diagnosis not present

## 2016-01-08 DIAGNOSIS — Z9012 Acquired absence of left breast and nipple: Secondary | ICD-10-CM | POA: Diagnosis not present

## 2016-01-08 DIAGNOSIS — C7951 Secondary malignant neoplasm of bone: Secondary | ICD-10-CM | POA: Diagnosis not present

## 2016-01-08 DIAGNOSIS — Z510 Encounter for antineoplastic radiation therapy: Secondary | ICD-10-CM | POA: Diagnosis not present

## 2016-01-08 DIAGNOSIS — Z794 Long term (current) use of insulin: Secondary | ICD-10-CM | POA: Diagnosis not present

## 2016-01-08 DIAGNOSIS — E119 Type 2 diabetes mellitus without complications: Secondary | ICD-10-CM | POA: Diagnosis not present

## 2016-01-08 DIAGNOSIS — J449 Chronic obstructive pulmonary disease, unspecified: Secondary | ICD-10-CM | POA: Diagnosis not present

## 2016-01-08 DIAGNOSIS — I10 Essential (primary) hypertension: Secondary | ICD-10-CM | POA: Diagnosis not present

## 2016-01-08 DIAGNOSIS — E039 Hypothyroidism, unspecified: Secondary | ICD-10-CM | POA: Diagnosis not present

## 2016-01-08 DIAGNOSIS — Z853 Personal history of malignant neoplasm of breast: Secondary | ICD-10-CM | POA: Diagnosis not present

## 2016-01-08 DIAGNOSIS — Z7984 Long term (current) use of oral hypoglycemic drugs: Secondary | ICD-10-CM | POA: Diagnosis not present

## 2016-01-08 MED ORDER — MORPHINE SULFATE 2 MG/ML IJ SOLN
4.0000 mg | Freq: Once | INTRAMUSCULAR | Status: AC
Start: 2016-01-08 — End: 2016-01-08
  Administered 2016-01-08: 4 mg via INTRAVENOUS
  Filled 2016-01-08: qty 2

## 2016-01-08 MED ORDER — MORPHINE SULFATE 2 MG/ML IJ SOLN: 4.0000 mg | Freq: Once | INTRAMUSCULAR | Status: DC

## 2016-01-08 NOTE — Telephone Encounter (Signed)
Madeline [name] called very concerned about her mother, her pain is not controlled at all and she is not eating. She has a great deal of pain just getting OOB to the [facility] and does not attempt to walk at all with the walker since she was told her cancer is so bad. She had cancelled the van and XRT aptt for today due to the pain, but I convinced her to come for the appt because getting XRT will help with her pain and I told her I would check with Dr Grayland Ormond to see what needs to be done, perhaps he can see her this afternoon

## 2016-01-08 NOTE — Telephone Encounter (Signed)
Per Dr Grayland Ormond, will give IV pain med if chemo has a chair. Notified [name] that she will get IVF pain med after her XRT today and to check in at front desk when she comes in today

## 2016-01-09 ENCOUNTER — Other Ambulatory Visit: Payer: Self-pay | Admitting: Family Medicine

## 2016-01-09 ENCOUNTER — Inpatient Hospital Stay: Payer: Medicare Other | Attending: Family Medicine | Admitting: Family Medicine

## 2016-01-09 ENCOUNTER — Inpatient Hospital Stay: Payer: Medicare Other

## 2016-01-09 ENCOUNTER — Ambulatory Visit
Admit: 2016-01-09 | Discharge: 2016-01-09 | Disposition: A | Discharge status: Home / Self Care | Payer: Medicare Other | Attending: Radiation Oncology | Admitting: Radiation Oncology

## 2016-01-09 ENCOUNTER — Encounter: Payer: Self-pay | Admitting: Pain Medicine

## 2016-01-09 VITALS — BP 118/80 | HR 99 | Temp 98.1°F | Resp 16

## 2016-01-09 VITALS — BP 112/72 | HR 73 | Resp 18

## 2016-01-09 DIAGNOSIS — G893 Neoplasm related pain (acute) (chronic): Secondary | ICD-10-CM

## 2016-01-09 DIAGNOSIS — I10 Essential (primary) hypertension: Secondary | ICD-10-CM | POA: Diagnosis not present

## 2016-01-09 DIAGNOSIS — Z794 Long term (current) use of insulin: Secondary | ICD-10-CM | POA: Diagnosis not present

## 2016-01-09 DIAGNOSIS — E039 Hypothyroidism, unspecified: Secondary | ICD-10-CM | POA: Insufficient documentation

## 2016-01-09 DIAGNOSIS — C7951 Secondary malignant neoplasm of bone: Secondary | ICD-10-CM

## 2016-01-09 DIAGNOSIS — R531 Weakness: Secondary | ICD-10-CM | POA: Diagnosis not present

## 2016-01-09 DIAGNOSIS — Z79899 Other long term (current) drug therapy: Secondary | ICD-10-CM | POA: Insufficient documentation

## 2016-01-09 DIAGNOSIS — F17210 Nicotine dependence, cigarettes, uncomplicated: Secondary | ICD-10-CM | POA: Diagnosis not present

## 2016-01-09 DIAGNOSIS — E119 Type 2 diabetes mellitus without complications: Secondary | ICD-10-CM

## 2016-01-09 DIAGNOSIS — R5381 Other malaise: Secondary | ICD-10-CM | POA: Diagnosis not present

## 2016-01-09 DIAGNOSIS — Z510 Encounter for antineoplastic radiation therapy: Secondary | ICD-10-CM | POA: Diagnosis not present

## 2016-01-09 DIAGNOSIS — E785 Hyperlipidemia, unspecified: Secondary | ICD-10-CM | POA: Diagnosis not present

## 2016-01-09 DIAGNOSIS — C50912 Malignant neoplasm of unspecified site of left female breast: Secondary | ICD-10-CM

## 2016-01-09 DIAGNOSIS — Z7984 Long term (current) use of oral hypoglycemic drugs: Secondary | ICD-10-CM | POA: Diagnosis not present

## 2016-01-09 DIAGNOSIS — R5383 Other fatigue: Secondary | ICD-10-CM

## 2016-01-09 DIAGNOSIS — Z9889 Other specified postprocedural states: Secondary | ICD-10-CM | POA: Diagnosis not present

## 2016-01-09 DIAGNOSIS — Z9012 Acquired absence of left breast and nipple: Secondary | ICD-10-CM | POA: Diagnosis not present

## 2016-01-09 DIAGNOSIS — Z9223 Personal history of estrogen therapy: Secondary | ICD-10-CM

## 2016-01-09 DIAGNOSIS — M8458XS Pathological fracture in neoplastic disease, other specified site, sequela: Secondary | ICD-10-CM

## 2016-01-09 DIAGNOSIS — G894 Chronic pain syndrome: Secondary | ICD-10-CM

## 2016-01-09 MED ORDER — MORPHINE SULFATE 2 MG/ML IJ SOLN
4.0000 mg | Freq: Once | INTRAMUSCULAR | Status: AC
  Administered 2016-01-09: 4 mg via INTRAMUSCULAR

## 2016-01-09 MED ORDER — MORPHINE SULFATE 2 MG/ML IJ SOLN
4.0000 mg | Freq: Once | INTRAMUSCULAR | Status: DC
  Filled 2016-01-09: qty 2

## 2016-01-09 NOTE — Progress Notes (Signed)
Patient is here to discuss pain management and they would also like to discuss Hospice at this time.

## 2016-01-09 NOTE — Progress Notes (Signed)
[place]  [phone]  [phone]     CLEMENCE STOMBAUGH DOB: Aug 02, 1951  MR#: [id number]  [id number]  Patient Care Team: Venia Carbon, MD as PCP - General  CHIEF COMPLAINT:  Acute add on for pain management  INTERVAL HISTORY:  Patient is being seen today as an acute add on for discussion of treatment options and pain management. She was recently admitted with acute vertebral compression fractures and at that time found to have widely metastatic bone disease. She continues to have increasing back pain as well as weakness and fatigue. She is currently in a motorized wheelchair. According to patient there is a home health agency that comes out to assist with hygiene. She is currently on 50 g of fentanyl. She is also using 5 mg oxycodone as needed.  REVIEW OF SYSTEMS:   Review of Systems  Constitutional: Positive for malaise/fatigue. Negative for fever, chills, weight loss and diaphoresis.  HENT: Negative.   Eyes: Negative.   Respiratory: Negative for cough, hemoptysis, sputum production, shortness of breath and wheezing.   Cardiovascular: Negative for chest pain, palpitations, orthopnea, claudication, leg swelling and PND.  Gastrointestinal: Negative for heartburn, nausea, vomiting, abdominal pain, diarrhea, constipation, blood in stool and melena.  Genitourinary: Negative.   Musculoskeletal: Positive for back pain and joint pain.  Skin: Negative.   Neurological: Positive for weakness. Negative for dizziness, tingling, focal weakness and seizures.  Endo/Heme/Allergies: Does not bruise/bleed easily.  Psychiatric/Behavioral: Negative for depression. The patient is nervous/anxious. The patient does not have insomnia.     As per HPI. Otherwise, a complete review of systems is negatve.    PAST MEDICAL HISTORY: Past Medical History  Diagnosis Date  . Depression   . Goiter, nodular     multi  . Dyslipidemia   . NIDDM, uncontrolled, with  neuropathy   . Hypertension   . Allergy   . GERD (gastroesophageal reflux disease)   . Urinary incontinence   . Peripheral neuropathy ([place])   . Multiple sclerosis ([place])   . Breast cancer ([place])   . Hypothyroidism   . Asthma   . COPD (chronic obstructive pulmonary disease) ([place])   . DVT (deep venous thrombosis) ([facility]) 1990's    right leg  . Gout   . Femur fracture ([place])   . Frequent UTI     PAST SURGICAL HISTORY: Past Surgical History  Procedure Laterality Date  . Thyroidectomy  09/2004  . Transthoracic echocardiogram  05/16/2004  . Partial hysterectomy  1975  . Axillary hidradenitis excision  1993    Excision biopsy growth right axilla, benign   . Other surgical history      Thyroid biopsy 10/99  . Carpal tunnel release  03/2008    bilateral  . Mastectomy, radical  02/2009    left modified  . Tonsillectomy    . Appendectomy    . Femur fracture surgery Left june 2016  . Kyphoplasty N/A 12/24/2015    Procedure: KYPHOPLASTY;  Surgeon: Hessie Knows, MD;  Location: [facility] ORS;  Service: Orthopedics;  Laterality: N/A;    FAMILY HISTORY Family History  Problem Relation Age of Onset  . Kidney failure Mother   . Heart disease Father 27  . Heart disease Brother   . Heart disease Brother     GYNECOLOGIC HISTORY:  No LMP recorded. Patient has had a hysterectomy.     ADVANCED DIRECTIVES:    HEALTH MAINTENANCE: Social History  Substance Use Topics  . Smoking status: Current Every Day  Smoker -- 0.00 packs/day for 50 years    Types: Cigarettes    Last Attempt to Quit: 07/12/2015  . Smokeless tobacco: Never Used     Comment: almost stopped, Pt states smokes 1 cigarette a day.  . Alcohol Use: No      Allergies  Allergen Reactions  . Penicillins Swelling    tongue and throat swelling tolerates cephalosporins  . Sulfa Antibiotics Hives and Itching  . Trovan [Alatrofloxacin] Hives  . Rosiglitazone Maleate Swelling    Current Outpatient Prescriptions  Medication Sig  Dispense Refill  . albuterol (PROVENTIL) (2.5 MG/3ML) 0.083% nebulizer solution Take 3 mLs (2.5 mg total) by nebulization every 6 (six) hours as needed for wheezing or shortness of breath. 75 mL 11  . allopurinol (ZYLOPRIM) 300 MG tablet Take 1 tablet (300 mg total) by mouth daily. 90 tablet 3  . aspirin 325 MG tablet Take 325 mg by mouth daily.      . baclofen (LIORESAL) 10 MG tablet Limit 1 tablet by mouth twice a day to 3 times a day if tolerated 80 each 0  . bisacodyl (DULCOLAX) 5 MG EC tablet Take 1 tablet (5 mg total) by mouth daily as needed for moderate constipation. 20 tablet 0  . budesonide-formoterol (SYMBICORT) 160-4.5 MCG/ACT inhaler Inhale 1 puff into the lungs 2 (two) times daily. 10.2 g 5  . Calcium Carbonate-Vitamin D (CALCIUM-VITAMIN D) 600-200 MG-UNIT CAPS Take by mouth 2 (two) times daily.     . carvedilol (COREG) 25 MG tablet Take 1 tablet (25 mg total) by mouth 2 (two) times daily with a meal. 180 tablet 3  . Cholecalciferol (VITAMIN D3) 1000 UNITS CAPS Take 4,000 capsules by mouth every morning.    . colchicine 0.6 MG tablet TAKE ONE TABLET TWICE DAILY AS NEEDED 60 tablet 0  . diazepam (VALIUM) 5 MG tablet TAKE ONE (1) TABLET BY MOUTH TWO (2) TIMES DAILY 60 tablet 0  . docusate sodium (COLACE) 100 MG capsule Take 1 capsule (100 mg total) by mouth 2 (two) times daily. 20 capsule 0  . fentaNYL (DURAGESIC) 25 MCG/HR patch Place 1 patch (25 mcg total) onto the skin every 3 (three) days. (Patient taking differently: Place 50 mcg onto the skin every 3 (three) days. ) 10 patch 0  . fish oil-omega-3 fatty acids 1000 MG capsule Take 2 g by mouth daily.      Marland Kitchen FLUoxetine (PROZAC) 20 MG capsule TAKE 2 CAPSULES BY MOUTH EVERY DAY 180 capsule 3  . furosemide (LASIX) 80 MG tablet Take 1 tablet (80 mg total) by mouth daily. Take second dose around lunchtime if your legs are swelling 180 tablet 3  . gabapentin (NEURONTIN) 300 MG capsule Limit 1-2 tablets by mouth 2-4 times per day if  tolerated 240 capsule 0  . glucose blood (ONE TOUCH TEST STRIPS) test strip Use as instructed to test blood sugar 3 times daily dx: E11.40 300 each 3  . insulin lispro (HUMALOG) 100 UNIT/ML KiwkPen Use according to sliding scale as directed 15 mL 3  . Insulin Pen Needle 30G X 5 MM MISC Use as instructed to inject insulin 2-3 times daily dx 250.60 300 each 6  . LANTUS SOLOSTAR 100 UNIT/ML Solostar Pen Inject 47 Units into the skin 2 (two) times daily.     Marland Kitchen letrozole (FEMARA) 2.5 MG tablet Take 1 tablet (2.5 mg total) by mouth daily. 30 tablet 5  . levothyroxine (SYNTHROID, LEVOTHROID) 175 MCG tablet Take 1 tablet (175 mcg total) by  mouth daily. 90 tablet 3  . meloxicam (MOBIC) 15 MG tablet Take 1 tablet (15 mg total) by mouth daily as needed. 90 tablet 3  . metFORMIN (GLUCOPHAGE) 1000 MG tablet Take 1 tablet (1,000 mg total) by mouth daily with breakfast. 90 tablet 3  . omeprazole (PRILOSEC) 20 MG capsule TAKE ONE CAPSULE BY MOUTH TWICE DAILY 180 capsule 3  . ONETOUCH DELICA LANCETS 99991111 MISC Use to test blood sugar three times daily dx: E11.40 300 each 0  . oxyCODONE (ROXICODONE) 5 MG immediate release tablet Limit 1 tablet by mouth 2-4 times per day if tolerated 120 tablet 0  . potassium chloride (KLOR-CON M10) 10 MEQ tablet TAKE 2 TABLETS BY MOUTH EVERY DAY 180 tablet 3  . tiotropium (SPIRIVA HANDIHALER) 18 MCG inhalation capsule PLACE 1 CAPSULE (18 MCG TOTAL) INTO INHALER AND INHALE DAILY AS NEEDED. 90 capsule 3  . traZODone (DESYREL) 100 MG tablet Take 2 tablets (200 mg total) by mouth at bedtime. 180 tablet 3   No current facility-administered medications for this visit.   Facility-Administered Medications Ordered in Other Visits  Medication Dose Route Frequency Provider Last Rate Last Dose  . morphine 2 MG/ML injection 4 mg  4 mg Intramuscular Once Evlyn Kanner, NP        OBJECTIVE: BP 118/80 mmHg  Pulse 99  Temp(Src) 98.1 F (36.7 C) (Tympanic)  Resp 16   There is no weight on  file to calculate BMI.    ECOG FS:3 - Symptomatic, >50% confined to bed  General: Well-developed, well-nourished, no acute distress, sitting in a motorized wheelchair. Eyes: Pink conjunctiva, anicteric sclera. HEENT: Normocephalic, moist mucous membranes, clear oropharnyx. Lungs: Clear to auscultation bilaterally. Heart: Regular rate and rhythm. No rubs, murmurs, or gallops. Musculoskeletal:  chronic back pain, No edema, cyanosis, or clubbing. Neuro: Alert, answering all questions appropriately. Cranial nerves grossly intact. Skin: No rashes or petechiae noted. Psych: Normal affect.   LAB RESULTS:   STUDIES: No results found.  ASSESSMENT:  Carcinoma breast. Pain.  PLAN:   1. Breast cancer. Patient was recently admitted with new onset of acute pain and noted to have compression fractures, she is status post kyphoplasty. Biopsy done at the time of kyphoplasty repair confirmed metastatic stage IV breast cancer. Patient has been in and out of the clinic the last several days for pain management. She is currently on 50 g of fentanyl and using 5 mg oxycodone. Patient was recently started on letrozole and Zometa and is currently undergoing radiation therapy for palliative pain management. After lengthy discussion patient has decided she would like to pursue hospice as curative treatment is not possible. 2. Pain. Continue with recently increased Fentanyl patch of 59mcg every 72 hours. Instructed patient to increase when necessary oxycodone use to 10 mg as needed every 4-6 hours.   We will make referral to hospice of [facility].   Patient expressed understanding and was in agreement with this plan. She also understands that She can call clinic at any time with any questions, concerns, or complaints.   Dr. Grayland Ormond was available for consultation and review of plan of care for this patient.  Evlyn Kanner, NP   01/09/2016 2:06 PM

## 2016-01-10 ENCOUNTER — Telehealth: Payer: Self-pay | Admitting: *Deleted

## 2016-01-10 ENCOUNTER — Ambulatory Visit: Payer: Medicare Other

## 2016-01-10 NOTE — Telephone Encounter (Signed)
Madeline [name].  She has recently decided on Hospice care.  I still recommend she take the letrozole. Can you confirm whether she's taking it or not? Thanks!

## 2016-01-10 NOTE — Telephone Encounter (Signed)
Pt daughter called and said the patient does not want to take any more radiation treatments and does not want to have her CT scan scheduled for tomorrow performed.

## 2016-01-10 NOTE — Telephone Encounter (Signed)
Does not want to take any more XRT, or have the CT scan, I notified XRT ([place] who said the daughter has already called and cancelled this) Asking for foley cath to be inserted because of pain to get up or to use the bedpan. Having heartburn even though she is on Omeprazole 20 mg bid

## 2016-01-10 NOTE — Telephone Encounter (Signed)
OK to insert Foley per Dr Mike Gip. Hospice notified

## 2016-01-11 ENCOUNTER — Ambulatory Visit: Payer: Medicare Other

## 2016-01-11 ENCOUNTER — Telehealth: Payer: Self-pay | Admitting: *Deleted

## 2016-01-11 ENCOUNTER — Ambulatory Visit: Admission: RE | Admit: 2016-01-11 | Payer: Medicare Other | Source: Ambulatory Visit

## 2016-01-11 MED ORDER — FENTANYL 75 MCG/HR TD PT72: 75.0000 ug | MEDICATED_PATCH | TRANSDERMAL | Status: DC

## 2016-01-11 NOTE — Telephone Encounter (Signed)
Wants to stop some of her meds, will stop letrozole

## 2016-01-14 ENCOUNTER — Ambulatory Visit: Payer: Medicare Other

## 2016-01-14 ENCOUNTER — Other Ambulatory Visit: Payer: Self-pay | Admitting: Internal Medicine

## 2016-01-14 ENCOUNTER — Other Ambulatory Visit: Payer: Self-pay | Admitting: *Deleted

## 2016-01-14 MED ORDER — OXYCODONE HCL 5 MG PO TABS: ORAL_TABLET | ORAL | Status: DC

## 2016-01-15 ENCOUNTER — Ambulatory Visit: Payer: Medicare Other

## 2016-01-16 ENCOUNTER — Ambulatory Visit: Payer: Medicare Other

## 2016-01-17 ENCOUNTER — Encounter: Payer: Self-pay | Admitting: Internal Medicine

## 2016-01-17 ENCOUNTER — Ambulatory Visit: Payer: Medicare Other

## 2016-01-22 ENCOUNTER — Ambulatory Visit: Payer: Medicare Other | Admitting: Oncology

## 2016-01-22 ENCOUNTER — Other Ambulatory Visit: Payer: Medicare Other

## 2016-01-22 ENCOUNTER — Ambulatory Visit: Payer: Medicare Other

## 2016-01-24 ENCOUNTER — Other Ambulatory Visit: Payer: Self-pay | Admitting: *Deleted

## 2016-01-24 MED ORDER — FENTANYL 75 MCG/HR TD PT72: 75.0000 ug | MEDICATED_PATCH | TRANSDERMAL | Status: DC

## 2016-01-29 ENCOUNTER — Encounter: Payer: Medicare Other | Admitting: Pain Medicine

## 2016-02-01 ENCOUNTER — Ambulatory Visit: Admitting: Internal Medicine

## 2016-02-01 ENCOUNTER — Encounter: Payer: Self-pay | Admitting: Internal Medicine

## 2016-02-01 VITALS — BP 128/60 | HR 72 | Resp 16

## 2016-02-01 DIAGNOSIS — E1140 Type 2 diabetes mellitus with diabetic neuropathy, unspecified: Secondary | ICD-10-CM | POA: Diagnosis not present

## 2016-02-01 DIAGNOSIS — G35 Multiple sclerosis: Secondary | ICD-10-CM

## 2016-02-01 DIAGNOSIS — F39 Unspecified mood [affective] disorder: Secondary | ICD-10-CM | POA: Diagnosis not present

## 2016-02-01 DIAGNOSIS — C50919 Malignant neoplasm of unspecified site of unspecified female breast: Secondary | ICD-10-CM | POA: Diagnosis not present

## 2016-02-01 DIAGNOSIS — C7951 Secondary malignant neoplasm of bone: Secondary | ICD-10-CM

## 2016-02-01 DIAGNOSIS — J439 Emphysema, unspecified: Secondary | ICD-10-CM

## 2016-02-01 DIAGNOSIS — G35D Multiple sclerosis, unspecified: Secondary | ICD-10-CM

## 2016-02-01 NOTE — Assessment & Plan Note (Signed)
May be contributing to increased sleeping and weakness--but basically not an issue anymore

## 2016-02-01 NOTE — Assessment & Plan Note (Signed)
Seems to be resigned to what is going on Will continue the meds though

## 2016-02-01 NOTE — Assessment & Plan Note (Signed)
Seems to be controlled with just the spiriva and albuterol

## 2016-02-01 NOTE — Assessment & Plan Note (Signed)
Sugars okay despite no meds Advised them to check at least once a week to be sure it isn't out of control

## 2016-02-01 NOTE — Assessment & Plan Note (Signed)
No treatment On hospice Pain control seems adequate now I can take over as hospice attending if Dr Grayland Ormond prefers

## 2016-02-01 NOTE — Progress Notes (Signed)
Subjective:    Patient ID: Madeline [name], female    DOB: [birth year], 65 y.o.   MRN: [id number]  HPI Home visit Daughters are here  Hospitalized with vertebral fracture and found to have multiple bony mets  Breast cancer recurrence Did go to [facility] for 10 days She decided on hospice care Has stopped almost all her meds  On fentanyl since hospital Dose increased to 54mcg about 3 weeks ago Still uses about 2 of the oxycodone every day  She has stopped almost all of her meds Still on fluoxetine and trazodone She is ready to die---wondering why it is taking a while Was actually scared and paranoid last week--then hospice nurse came out and she has improved some Mind is better now  Stopped all diabetes meds Sugar tested by aide recently --only 32 Not drinking or eating much  No SOB for the most part Wears the oxygen all the time No chest pain No sig edema ---horizontal in bed  Current Outpatient Prescriptions on File Prior to Visit  Medication Sig Dispense Refill  . albuterol (PROVENTIL) (2.5 MG/3ML) 0.083% nebulizer solution Take 3 mLs (2.5 mg total) by nebulization every 6 (six) hours as needed for wheezing or shortness of breath. 75 mL 11  . diazepam (VALIUM) 5 MG tablet TAKE ONE (1) TABLET BY MOUTH TWO (2) TIMES DAILY 60 tablet 0  . fentaNYL (DURAGESIC - DOSED MCG/HR) 75 MCG/HR Place 1 patch (75 mcg total) onto the skin every 3 (three) days. 5 patch 0  . FLUoxetine (PROZAC) 20 MG capsule TAKE 2 CAPSULES BY MOUTH EVERY DAY 180 capsule 3  . glucose blood (ONE TOUCH TEST STRIPS) test strip Use as instructed to test blood sugar 3 times daily dx: E11.40 300 each 3  . letrozole (FEMARA) 2.5 MG tablet Take 1 tablet (2.5 mg total) by mouth daily. 30 tablet 5  . ONETOUCH DELICA LANCETS 99991111 MISC Use to test blood sugar three times daily dx: E11.40 300 each 0  . oxyCODONE (ROXICODONE) 5 MG immediate release tablet 1 - 2 tablets every 4 - 6  Hours as needed for pain 180 tablet 0  .  tiotropium (SPIRIVA HANDIHALER) 18 MCG inhalation capsule PLACE 1 CAPSULE (18 MCG TOTAL) INTO INHALER AND INHALE DAILY AS NEEDED. 90 capsule 3  . traZODone (DESYREL) 100 MG tablet Take 2 tablets (200 mg total) by mouth at bedtime. 180 tablet 3   No current facility-administered medications on file prior to visit.    Allergies  Allergen Reactions  . Penicillins Swelling    tongue and throat swelling tolerates cephalosporins  . Sulfa Antibiotics Hives and Itching  . Trovan [Alatrofloxacin] Hives  . Rosiglitazone Maleate Swelling    Past Medical History  Diagnosis Date  . Depression   . Goiter, nodular     multi  . Dyslipidemia   . NIDDM, uncontrolled, with neuropathy   . Hypertension   . Allergy   . GERD (gastroesophageal reflux disease)   . Urinary incontinence   . Peripheral neuropathy ([place])   . Multiple sclerosis ([place])   . Breast cancer ([place])   . Hypothyroidism   . Asthma   . COPD (chronic obstructive pulmonary disease) ([place])   . DVT (deep venous thrombosis) ([facility]) 1990's    right leg  . Gout   . Femur fracture ([place])   . Frequent UTI     Past Surgical History  Procedure Laterality Date  . Thyroidectomy  09/2004  . Transthoracic echocardiogram  05/16/2004  .  Partial hysterectomy  1975  . Axillary hidradenitis excision  1993    Excision biopsy growth right axilla, benign   . Other surgical history      Thyroid biopsy 10/99  . Carpal tunnel release  03/2008    bilateral  . Mastectomy, radical  02/2009    left modified  . Tonsillectomy    . Appendectomy    . Femur fracture surgery Left june 2016  . Kyphoplasty N/A 12/24/2015    Procedure: KYPHOPLASTY;  Surgeon: Hessie Knows, MD;  Location: [facility] ORS;  Service: Orthopedics;  Laterality: N/A;    Family History  Problem Relation Age of Onset  . Kidney failure Mother   . Heart disease Father 44  . Heart disease Brother   . Heart disease Brother     Social History   Social History  . Marital Status: Widowed     Spouse Name: N/A  . Number of Children: 4  . 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 Smoking status: Current Every Day Smoker -- 0.00 packs/day for 50 years    Types: Cigarettes    Last Attempt to Quit: 07/12/2015  . Smokeless tobacco: Never Used     Comment: almost stopped, Pt states smokes 1 cigarette a day.  . Alcohol Use: No  . Drug Use: No  . Sexual Activity: Not on file   Other Topics Concern  . Not on file   Social History Narrative   Widowed 1999 then 2nd Marriage--2000. Widowed again 2009   Living with daughter now   Has living will   Daughter [name] is health care POA.   Would accept rescitation but no prolonged artificial ventilation   No feeding tube if cognitively unaware   Review of Systems Barely eating Sleeping a lot---most of the days Some constipation--using miralax Foley put in about 3 weeks ago--for comfort since she is bed bound No bed sores    Objective:   Physical Exam  Constitutional: No distress.  Comfortable in bed Normal interaction with me  Neck: No JVD present. No thyromegaly present.  Cardiovascular: Normal rate, regular rhythm and normal heart sounds.  Exam reveals no gallop.   No murmur heard. Pulmonary/Chest: Effort normal and breath sounds normal. No respiratory distress. She has no wheezes. She has no rales.  Abdominal: Soft. There is no tenderness.  Musculoskeletal:  Thick calves but no pitting  Lymphadenopathy:    She has no cervical adenopathy.  Psychiatric: She has a normal mood and affect. Her behavior is normal.          Assessment & Plan:

## 2016-02-04 ENCOUNTER — Other Ambulatory Visit: Payer: Self-pay | Admitting: *Deleted

## 2016-02-04 MED ORDER — MORPHINE SULFATE (CONCENTRATE) 20 MG/ML PO SOLN: ORAL | Status: DC

## 2016-02-04 MED ORDER — FENTANYL 75 MCG/HR TD PT72: 75.0000 ug | MEDICATED_PATCH | TRANSDERMAL | Status: DC

## 2016-02-06 ENCOUNTER — Telehealth: Payer: Self-pay | Admitting: *Deleted

## 2016-02-06 NOTE — Telephone Encounter (Signed)
Asking if you want to go up on her fentanyl patch. She is using oxycodone 5 mg three times a day for pain control and is on Fentanyl 75 mcg

## 2016-02-07 ENCOUNTER — Other Ambulatory Visit: Payer: Self-pay | Admitting: Internal Medicine

## 2016-02-07 MED ORDER — FENTANYL 100 MCG/HR TD PT72: 100.0000 ug | MEDICATED_PATCH | TRANSDERMAL | Status: DC

## 2016-02-07 NOTE — Telephone Encounter (Signed)
Last refill 12-11-15 #60/0 Last OV 02-01-16 No Future visit scheduled

## 2016-02-07 NOTE — Telephone Encounter (Signed)
Left refill on voice mail at pharmacy  

## 2016-02-07 NOTE — Telephone Encounter (Signed)
Approved: #60 x 1 On hospice for metastatic cancer

## 2016-02-07 NOTE — Telephone Encounter (Signed)
She can go up to 145mcg fentanyl patch.  I believe she is on hospice, so we can fax that in.  Thanks.

## 2016-02-11 ENCOUNTER — Telehealth: Payer: Self-pay

## 2016-02-11 NOTE — Telephone Encounter (Signed)
Madeline [name] with [facility] left v/m; metformin making pt sick on stomach and has not taken for couple days; pts appetite has declined significantly; BS averaging 210-300. Pt daughter said pt taking Lantus 47 units bid for last 2 days and BS are in 300 range. Helene Kelp request cb with further instructions.

## 2016-02-11 NOTE — Telephone Encounter (Signed)
Okay to increase the lantus by 2-3 units at each dose (like go up to 50 bid now) and then increase 4-5 total units every 3 days or so till the sugars are generally under 200

## 2016-02-11 NOTE — Telephone Encounter (Signed)
Spoke to [place]. She said she is not taking her metformin regularly. She wrote the orders are repeated them to me.

## 2016-02-12 ENCOUNTER — Other Ambulatory Visit: Payer: Self-pay | Admitting: Family Medicine

## 2016-02-12 NOTE — Telephone Encounter (Signed)
Her sugars were running high but I had instructed her about increasing lantus. I would like her to avoid humalog due to risk of low sugar reactions See previous note about my recommendations for lantus

## 2016-02-12 NOTE — Telephone Encounter (Signed)
We received a refill request for Humalog Kwik-Pen. The patient was seen 02/01/16. It looks like her insulin was discharged, but possibly in error. Is she still supposed to be on Novalog or Humalog?

## 2016-02-14 MED ORDER — INSULIN LISPRO 100 UNIT/ML (KWIKPEN): 1.0000 [IU] | PEN_INJECTOR | SUBCUTANEOUS | Status: AC

## 2016-02-14 NOTE — Telephone Encounter (Signed)
Spoke to daughter. She has been using the [place] so I sent in a rx for that.

## 2016-02-14 NOTE — Telephone Encounter (Signed)
Approved: okay to refill the humalog then  #2 vials Use as directed prn for high blood sugar

## 2016-02-14 NOTE — Telephone Encounter (Signed)
She had told me at my visit that she wasn't taking it--only the lantus Please clarify

## 2016-02-14 NOTE — Telephone Encounter (Signed)
Since Monday she has been on both Lantus and Humalog. Her blood sugar was 213 this morning. Daughter has been doing a sliding scale of humalog up to 10 units depending on her blood sugar levels.

## 2016-02-14 NOTE — Addendum Note (Signed)
Addended by: Pilar Grammes on: 02/14/2016 04:58 PM   Modules accepted: Orders, Medications

## 2016-02-14 NOTE — Telephone Encounter (Signed)
Helene Kelp nurse with [facility] called about stopping Humalog; Helene Kelp advised per instructions from 02/12/16 note; Helene Kelp wanted to know if pt is to stop Humalog completely or stop Humalog when pt has low blood sugars.

## 2016-02-18 ENCOUNTER — Other Ambulatory Visit: Payer: Self-pay | Admitting: Internal Medicine

## 2016-02-18 ENCOUNTER — Other Ambulatory Visit: Payer: Self-pay | Admitting: *Deleted

## 2016-02-18 MED ORDER — FENTANYL 100 MCG/HR TD PT72: 100.0000 ug | MEDICATED_PATCH | TRANSDERMAL | Status: DC

## 2016-02-18 MED ORDER — MORPHINE SULFATE (CONCENTRATE) 20 MG/ML PO SOLN: ORAL | Status: DC

## 2016-02-18 MED ORDER — FENTANYL 25 MCG/HR TD PT72: 25.0000 ug | MEDICATED_PATCH | TRANSDERMAL | Status: DC

## 2016-02-18 NOTE — Telephone Encounter (Signed)
ASking for refill on Fentanyl, but also daughter asking to increase dose to 125 mcg from 100 mcg because she is using 3 breakthrough pills (Oxycodone) a day and 2 lorazepam as well Needs refill on Roxanol

## 2016-02-18 NOTE — Telephone Encounter (Signed)
OK per Dr Grayland Ormond to increase patch to 125 mcg. Rx faxed, Seth Bake informed

## 2016-03-06 ENCOUNTER — Other Ambulatory Visit: Payer: Self-pay | Admitting: *Deleted

## 2016-03-06 MED ORDER — DIAZEPAM 5 MG PO TABS: 5.0000 mg | ORAL_TABLET | Freq: Two times a day (BID) | ORAL | Status: AC | PRN

## 2016-03-06 MED ORDER — OXYCODONE HCL 5 MG PO TABS: ORAL_TABLET | ORAL | Status: AC

## 2016-03-06 MED ORDER — FENTANYL 100 MCG/HR TD PT72: 100.0000 ug | MEDICATED_PATCH | TRANSDERMAL | Status: AC

## 2016-03-06 MED ORDER — FENTANYL 25 MCG/HR TD PT72: 25.0000 ug | MEDICATED_PATCH | TRANSDERMAL | Status: DC

## 2016-03-12 ENCOUNTER — Other Ambulatory Visit: Payer: Self-pay | Admitting: *Deleted

## 2016-03-12 MED ORDER — MORPHINE SULFATE (CONCENTRATE) 20 MG/ML PO SOLN: ORAL | Status: AC

## 2016-03-12 NOTE — Telephone Encounter (Signed)
Per Dr Grayland Ormond, increase Fentanyl to 150 mcg. Helene Kelp notified and said patient has 75 mcg patches on had and will put 2 of those on her

## 2016-03-12 NOTE — Telephone Encounter (Signed)
Currently on Fentanyl 125 mcg and is using Oxycodone 3 times a day, since midnight, she has used Roxanol 4 times as well. Asking if you want to go up on her fentanyl dose

## 2016-03-17 ENCOUNTER — Telehealth: Payer: Self-pay | Admitting: *Deleted

## 2016-03-17 ENCOUNTER — Encounter: Payer: Self-pay | Admitting: Oncology

## 2016-03-17 MED ORDER — FENTANYL 50 MCG/HR TD PT72: 50.0000 ug | MEDICATED_PATCH | TRANSDERMAL | Status: AC

## 2016-03-17 NOTE — Telephone Encounter (Signed)
Per Dr Grayland Ormond UA C&S to be collected. Helene Kelp informed

## 2016-03-17 NOTE — Telephone Encounter (Signed)
Called to report that patient is "having burning and stinging with urination and has a foley" Asking for order to do UA

## 2016-03-18 ENCOUNTER — Other Ambulatory Visit: Payer: Self-pay | Admitting: *Deleted

## 2016-03-18 ENCOUNTER — Telehealth: Payer: Self-pay | Admitting: *Deleted

## 2016-03-18 DIAGNOSIS — N390 Urinary tract infection, site not specified: Secondary | ICD-10-CM

## 2016-03-18 MED ORDER — NITROFURANTOIN MONOHYD MACRO 100 MG PO CAPS: 100.0000 mg | ORAL_CAPSULE | Freq: Two times a day (BID) | ORAL | Status: AC

## 2016-03-18 NOTE — Telephone Encounter (Signed)
Macrobid 100 mg BID for 7 days sent to patients pharmacy. Helene Kelp, Hospice RN notified.

## 2016-03-24 ENCOUNTER — Other Ambulatory Visit: Payer: Self-pay | Admitting: Internal Medicine

## 2016-03-25 ENCOUNTER — Other Ambulatory Visit: Payer: Self-pay | Admitting: *Deleted

## 2016-03-25 DIAGNOSIS — C799 Secondary malignant neoplasm of unspecified site: Secondary | ICD-10-CM

## 2016-03-25 MED ORDER — MORPHINE SULFATE (CONCENTRATE) 20 MG/ML PO SOLN: 20.0000 mg | ORAL | Status: AC | PRN

## 2016-04-10 DEATH — deceased

## 2016-09-11 ENCOUNTER — Ambulatory Visit: Payer: Medicare Other | Admitting: Oncology

## 2016-09-11 ENCOUNTER — Other Ambulatory Visit: Payer: Medicare Other

## 2017-06-22 IMAGING — CT CT RENAL STONE PROTOCOL
2 of 4 series · 17 of 46 positions shown, 19 images · non-contrast
Comparison: None.

CLINICAL DATA: Bilateral back pain for 3 days

EXAM:
CT ABDOMEN AND PELVIS WITHOUT CONTRAST
TECHNIQUE: Multidetector CT imaging of the abdomen and pelvis was performed
following the standard protocol without IV contrast.

[Series 2: routine abd pel wo · axial · 0.98mm/px · z∈[-566,-140]mm · 14 of 93 slices shown, 16 images]
[im 4/93  soft-tissue]
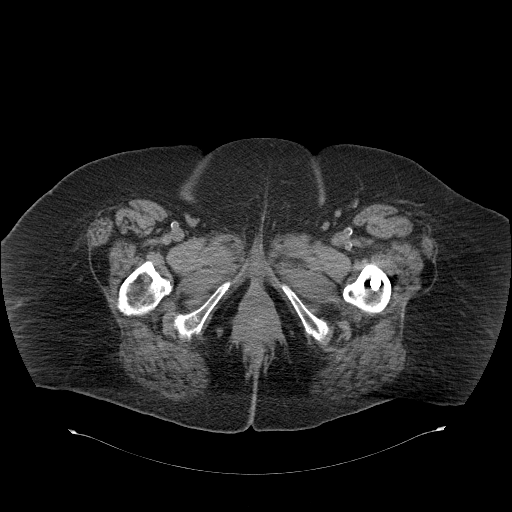
[im 4/93  bone]
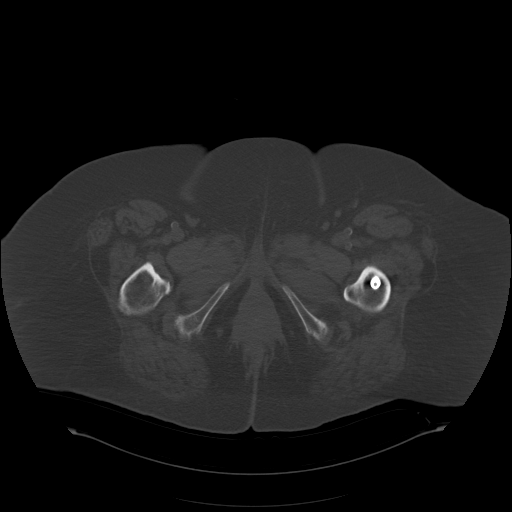
[im 12/93  soft-tissue]
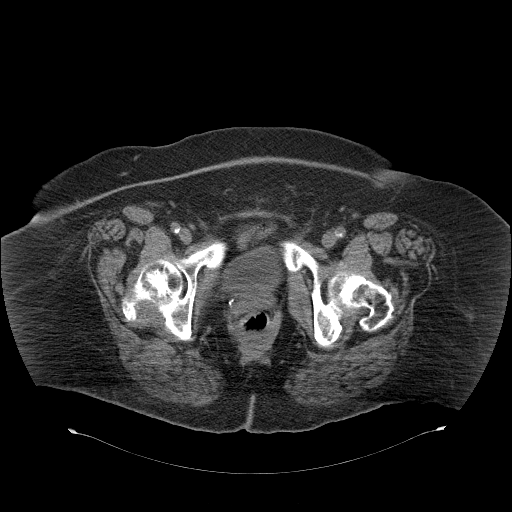
[im 20/93  soft-tissue]
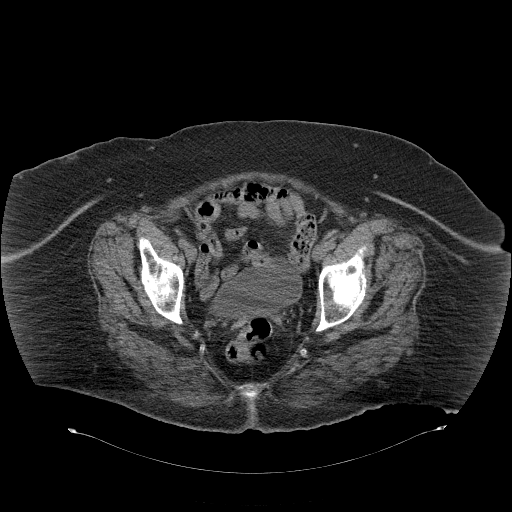
[im 24/93  soft-tissue]
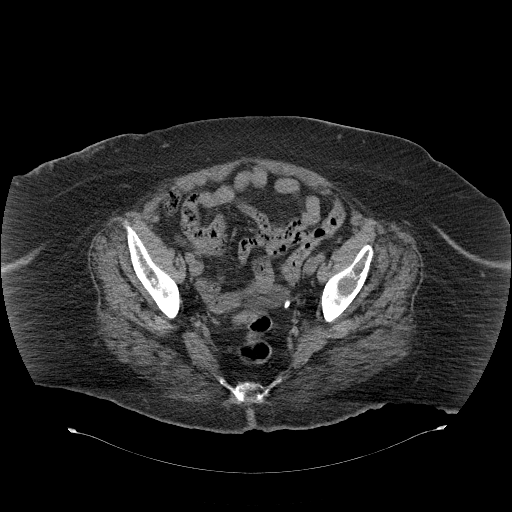
[im 31/93  soft-tissue]
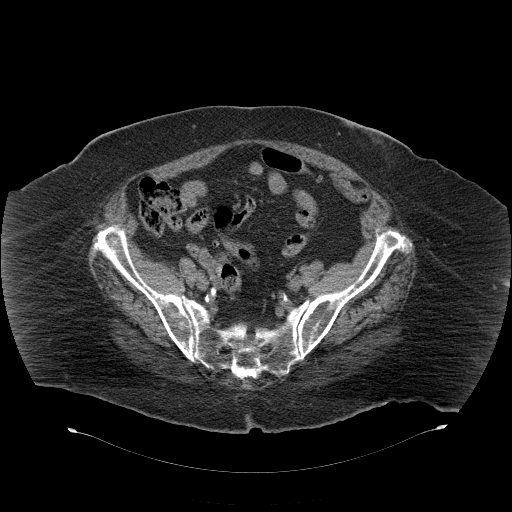
[im 39/93  soft-tissue]
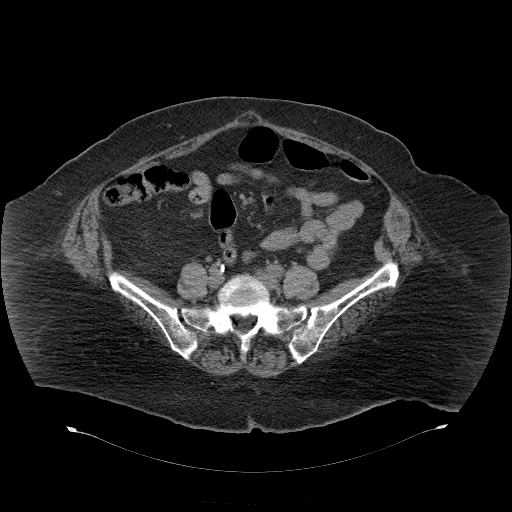
[im 43/93  soft-tissue]
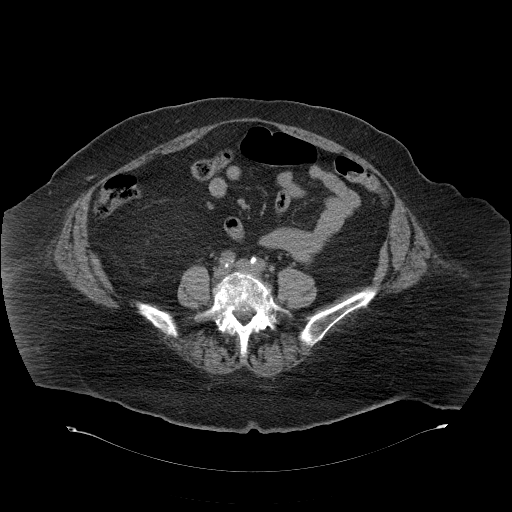
[im 50/93  soft-tissue]
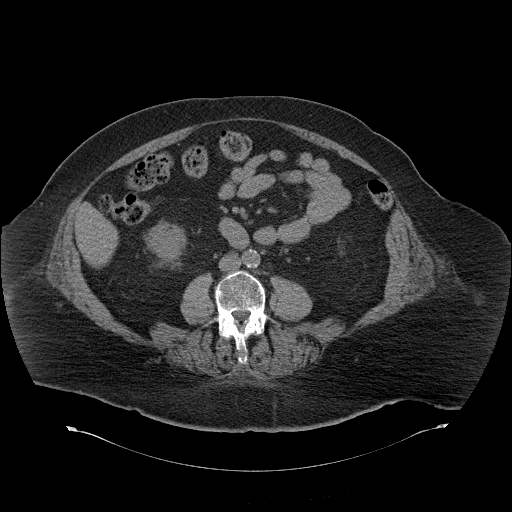
[im 54/93  soft-tissue]
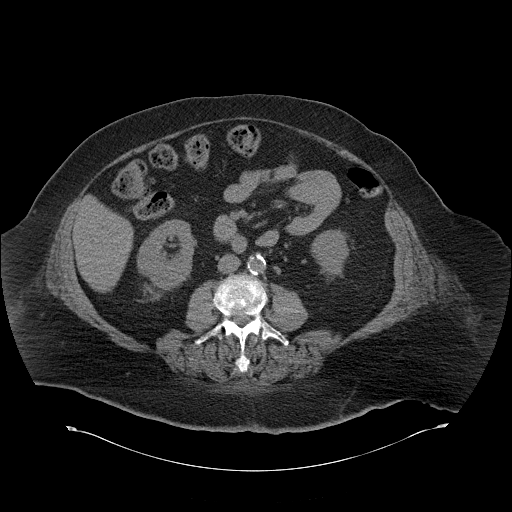
[im 54/93  bone]
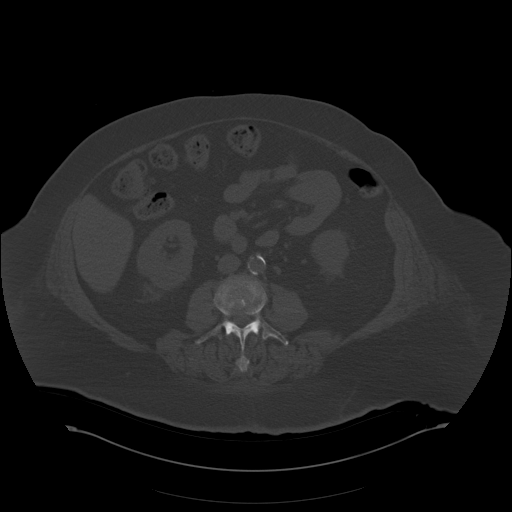
[im 62/93  soft-tissue]
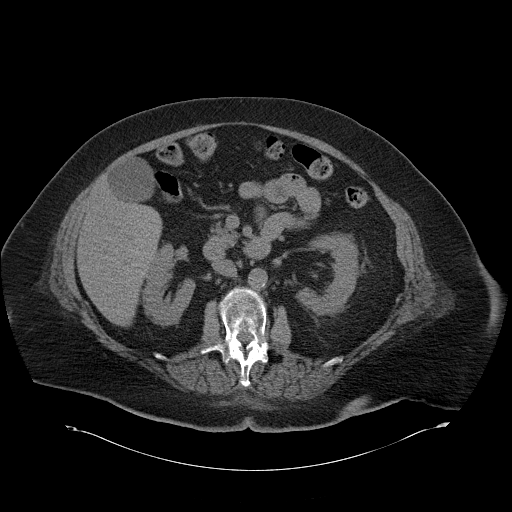
[im 70/93  soft-tissue]
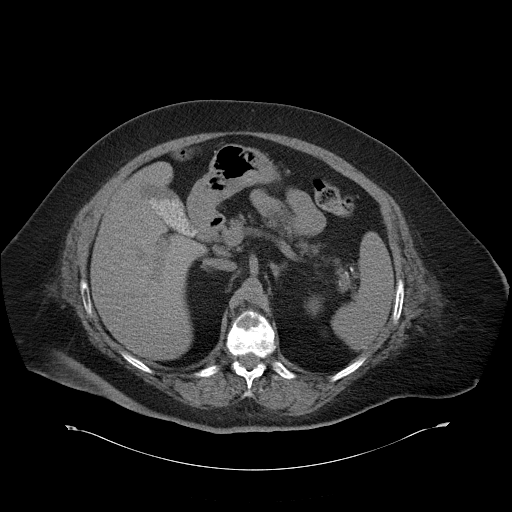
[im 73/93  soft-tissue]
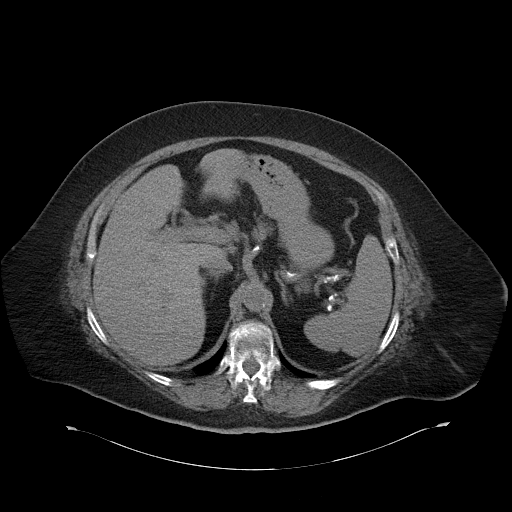
[im 81/93  soft-tissue]
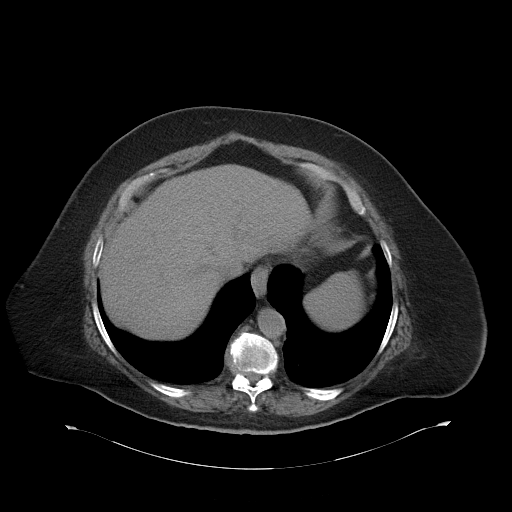
[im 89/93  soft-tissue]
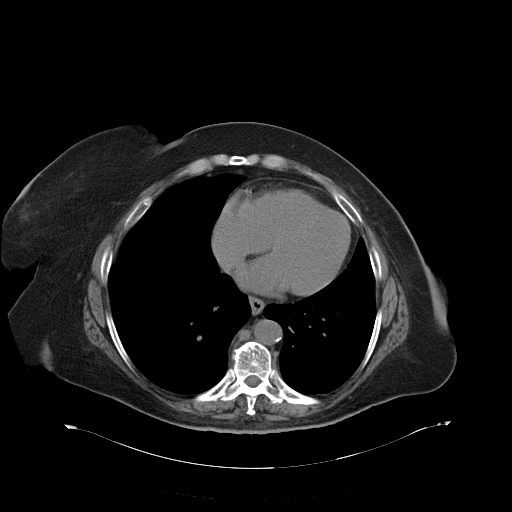

[Series 5: cor routine abd pel wo · coronal · 0.91mm/px · 3 of 158 slices shown]
[im 53/158  soft-tissue]
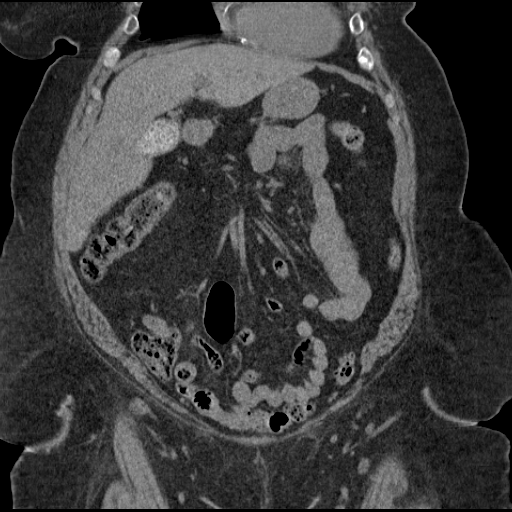
[im 70/158  soft-tissue]
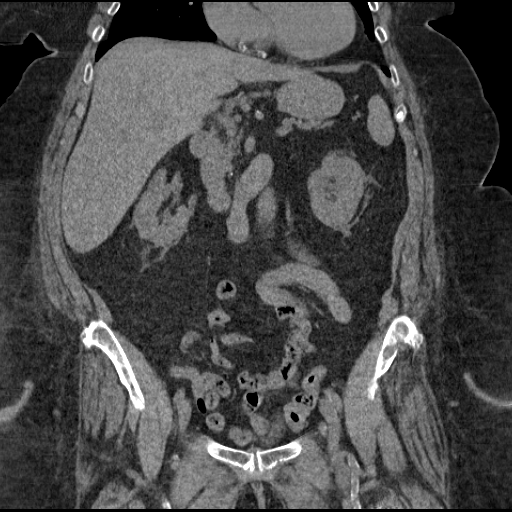
[im 88/158  soft-tissue]
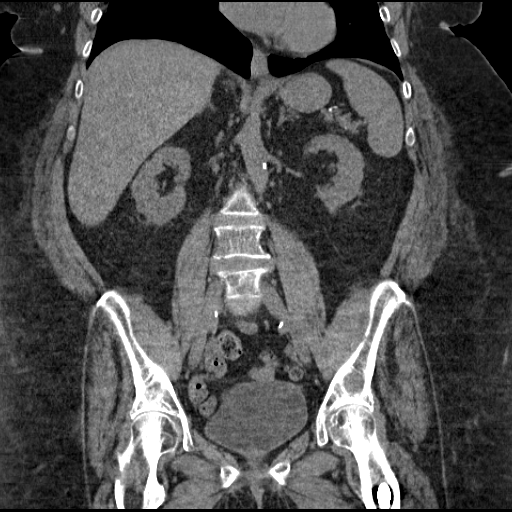

[17 of 46 positions shown; findings below may reference images not displayed]

FINDINGS: Lung bases are free of acute infiltrate or sizable effusion.

The liver, spleen, adrenal glands and pancreas are all normal in
their CT appearance. The gallbladder is well distended with multiple
dependent gallstones. No biliary obstructive changes are seen.
Kidneys are well visualized bilaterally without evidence of renal
calculi or urinary tract obstructive changes. The bladder is
partially distended.

Aortoiliac calcifications are noted without aneurysmal dilatation.
The appendix is within normal limits. The osseous structures show
postsurgical changes in the proximal left femur. There also multiple
lytic lesions consistent with metastatic disease. Significant
destruction is noted in the T10 vertebral body on the right with
some impingement upon the thecal sac. Lytic lesions are also noted
anteriorly in the L1 vertebral body as well within the L3, L4 and L5
vertebral bodies.
IMPRESSION: Cholelithiasis without complicating factors.

Multiple lytic lesions within the thoracic and lumbar spine
consistent with metastatic breast cancer. Compression deformity of
uncertain chronicity is noted at T12. The T10 lesion encroaches upon
the thecal sac. Further evaluation by means of MRI is recommended.
